# Patient Record
Sex: Male | Born: 1935 | Race: White | Hispanic: No | Marital: Married | Smoking: Former smoker
Health system: Southern US, Community
[De-identification: ages and names within clinical notes are randomized; demographics above are authoritative.]

## PROBLEM LIST (undated history)

## (undated) DIAGNOSIS — C679 Malignant neoplasm of bladder, unspecified: Secondary | ICD-10-CM

## (undated) DIAGNOSIS — M109 Gout, unspecified: Secondary | ICD-10-CM

## (undated) DIAGNOSIS — J342 Deviated nasal septum: Secondary | ICD-10-CM

## (undated) DIAGNOSIS — I6529 Occlusion and stenosis of unspecified carotid artery: Secondary | ICD-10-CM

## (undated) DIAGNOSIS — Z9889 Other specified postprocedural states: Secondary | ICD-10-CM

## (undated) DIAGNOSIS — K8020 Calculus of gallbladder without cholecystitis without obstruction: Secondary | ICD-10-CM

## (undated) DIAGNOSIS — C801 Malignant (primary) neoplasm, unspecified: Secondary | ICD-10-CM

## (undated) DIAGNOSIS — I10 Essential (primary) hypertension: Secondary | ICD-10-CM

## (undated) DIAGNOSIS — G629 Polyneuropathy, unspecified: Secondary | ICD-10-CM

## (undated) DIAGNOSIS — I359 Nonrheumatic aortic valve disorder, unspecified: Secondary | ICD-10-CM

## (undated) DIAGNOSIS — I4892 Unspecified atrial flutter: Secondary | ICD-10-CM

## (undated) DIAGNOSIS — I714 Abdominal aortic aneurysm, without rupture, unspecified: Secondary | ICD-10-CM

## (undated) DIAGNOSIS — E7800 Pure hypercholesterolemia, unspecified: Secondary | ICD-10-CM

## (undated) DIAGNOSIS — E538 Deficiency of other specified B group vitamins: Secondary | ICD-10-CM

## (undated) DIAGNOSIS — C189 Malignant neoplasm of colon, unspecified: Secondary | ICD-10-CM

## (undated) DIAGNOSIS — I739 Peripheral vascular disease, unspecified: Secondary | ICD-10-CM

## (undated) DIAGNOSIS — N189 Chronic kidney disease, unspecified: Secondary | ICD-10-CM

## (undated) DIAGNOSIS — I4891 Unspecified atrial fibrillation: Secondary | ICD-10-CM

## (undated) DIAGNOSIS — G510 Bell's palsy: Secondary | ICD-10-CM

## (undated) DIAGNOSIS — K9189 Other postprocedural complications and disorders of digestive system: Secondary | ICD-10-CM

## (undated) DIAGNOSIS — I2510 Atherosclerotic heart disease of native coronary artery without angina pectoris: Secondary | ICD-10-CM

## (undated) HISTORY — DX: Essential (primary) hypertension: I10

## (undated) HISTORY — DX: Chronic kidney disease, unspecified: N18.9

## (undated) HISTORY — DX: Peripheral vascular disease, unspecified: I73.9

## (undated) HISTORY — PX: LAMINECTOMY: SHX219

## (undated) HISTORY — DX: Malignant neoplasm of colon, unspecified: C18.9

## (undated) HISTORY — DX: Unspecified atrial flutter: I48.92

## (undated) HISTORY — DX: Malignant neoplasm of bladder, unspecified: C67.9

## (undated) HISTORY — PX: OTHER SURGICAL HISTORY: SHX169

## (undated) HISTORY — DX: Pure hypercholesterolemia, unspecified: E78.00

## (undated) HISTORY — DX: Gout, unspecified: M10.9

## (undated) HISTORY — PX: APPENDECTOMY: SHX54

## (undated) HISTORY — DX: Nonrheumatic aortic valve disorder, unspecified: I35.9

## (undated) HISTORY — DX: Unspecified atrial fibrillation: I48.91

## (undated) HISTORY — DX: Bell's palsy: G51.0

## (undated) HISTORY — DX: Malignant (primary) neoplasm, unspecified: C80.1

## (undated) HISTORY — DX: Calculus of gallbladder without cholecystitis without obstruction: K80.20

## (undated) HISTORY — DX: Deficiency of other specified B group vitamins: E53.8

## (undated) HISTORY — PX: TOE SURGERY: SHX1073

## (undated) HISTORY — PX: HEMICOLECTOMY: SHX854

## (undated) HISTORY — DX: Abdominal aortic aneurysm, without rupture, unspecified: I71.40

## (undated) HISTORY — DX: Atherosclerotic heart disease of native coronary artery without angina pectoris: I25.10

## (undated) HISTORY — DX: Occlusion and stenosis of unspecified carotid artery: I65.29

## (undated) HISTORY — DX: Abdominal aortic aneurysm, without rupture: I71.4

---
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
## 1997-11-26 ENCOUNTER — Ambulatory Visit (HOSPITAL_COMMUNITY): Admission: RE | Admit: 1997-11-26 | Discharge: 1997-11-26 | Payer: Self-pay | Admitting: Gastroenterology

## 1999-04-21 DIAGNOSIS — G629 Polyneuropathy, unspecified: Secondary | ICD-10-CM

## 1999-04-21 HISTORY — DX: Polyneuropathy, unspecified: G62.9

## 2000-02-19 HISTORY — PX: CORONARY ARTERY BYPASS GRAFT: SHX141

## 2000-04-23 ENCOUNTER — Inpatient Hospital Stay (HOSPITAL_COMMUNITY): Admission: EM | Admit: 2000-04-23 | Discharge: 2000-05-04 | Payer: Self-pay

## 2000-04-26 ENCOUNTER — Encounter: Payer: Self-pay | Admitting: Internal Medicine

## 2000-04-27 ENCOUNTER — Encounter: Payer: Self-pay | Admitting: Cardiothoracic Surgery

## 2000-04-28 ENCOUNTER — Encounter: Payer: Self-pay | Admitting: Cardiothoracic Surgery

## 2000-04-29 ENCOUNTER — Encounter: Payer: Self-pay | Admitting: Cardiothoracic Surgery

## 2000-04-30 ENCOUNTER — Encounter: Payer: Self-pay | Admitting: Cardiothoracic Surgery

## 2000-05-25 ENCOUNTER — Encounter (HOSPITAL_COMMUNITY): Admission: RE | Admit: 2000-05-25 | Discharge: 2000-08-23 | Payer: Self-pay | Admitting: *Deleted

## 2000-08-30 ENCOUNTER — Encounter (HOSPITAL_COMMUNITY): Admission: RE | Admit: 2000-08-30 | Discharge: 2000-11-28 | Payer: Self-pay | Admitting: *Deleted

## 2000-09-17 ENCOUNTER — Ambulatory Visit (HOSPITAL_COMMUNITY): Admission: RE | Admit: 2000-09-17 | Discharge: 2000-09-17 | Payer: Self-pay | Admitting: *Deleted

## 2000-11-29 ENCOUNTER — Encounter (HOSPITAL_COMMUNITY): Admission: RE | Admit: 2000-11-29 | Discharge: 2000-11-30 | Payer: Self-pay | Admitting: *Deleted

## 2000-12-21 ENCOUNTER — Ambulatory Visit (HOSPITAL_COMMUNITY): Admission: RE | Admit: 2000-12-21 | Discharge: 2000-12-21 | Payer: Self-pay | Admitting: *Deleted

## 2001-01-17 ENCOUNTER — Ambulatory Visit (HOSPITAL_COMMUNITY): Admission: RE | Admit: 2001-01-17 | Discharge: 2001-01-17 | Payer: Self-pay | Admitting: *Deleted

## 2001-01-17 ENCOUNTER — Encounter: Payer: Self-pay | Admitting: *Deleted

## 2001-01-26 ENCOUNTER — Observation Stay (HOSPITAL_COMMUNITY): Admission: RE | Admit: 2001-01-26 | Discharge: 2001-01-27 | Payer: Self-pay | Admitting: Urology

## 2001-01-26 ENCOUNTER — Encounter (INDEPENDENT_AMBULATORY_CARE_PROVIDER_SITE_OTHER): Payer: Self-pay | Admitting: *Deleted

## 2001-01-29 ENCOUNTER — Inpatient Hospital Stay (HOSPITAL_COMMUNITY): Admission: EM | Admit: 2001-01-29 | Discharge: 2001-02-01 | Payer: Self-pay | Admitting: Emergency Medicine

## 2001-01-31 ENCOUNTER — Encounter: Payer: Self-pay | Admitting: Urology

## 2001-02-18 HISTORY — PX: BLADDER SURGERY: SHX569

## 2001-02-23 ENCOUNTER — Encounter (INDEPENDENT_AMBULATORY_CARE_PROVIDER_SITE_OTHER): Payer: Self-pay | Admitting: Specialist

## 2001-02-23 ENCOUNTER — Inpatient Hospital Stay (HOSPITAL_COMMUNITY): Admission: RE | Admit: 2001-02-23 | Discharge: 2001-03-03 | Payer: Self-pay | Admitting: Urology

## 2001-03-16 ENCOUNTER — Encounter: Payer: Self-pay | Admitting: Urology

## 2001-03-16 ENCOUNTER — Encounter: Admission: RE | Admit: 2001-03-16 | Discharge: 2001-03-16 | Payer: Self-pay | Admitting: Urology

## 2001-03-23 ENCOUNTER — Encounter: Payer: Self-pay | Admitting: Urology

## 2001-03-23 ENCOUNTER — Inpatient Hospital Stay (HOSPITAL_COMMUNITY): Admission: EM | Admit: 2001-03-23 | Discharge: 2001-03-26 | Payer: Self-pay | Admitting: Emergency Medicine

## 2001-03-23 ENCOUNTER — Encounter: Payer: Self-pay | Admitting: Emergency Medicine

## 2001-03-25 ENCOUNTER — Encounter: Payer: Self-pay | Admitting: Urology

## 2001-04-08 ENCOUNTER — Encounter: Admission: RE | Admit: 2001-04-08 | Discharge: 2001-04-08 | Payer: Self-pay | Admitting: Urology

## 2001-04-08 ENCOUNTER — Encounter: Payer: Self-pay | Admitting: Urology

## 2001-07-01 ENCOUNTER — Inpatient Hospital Stay (HOSPITAL_COMMUNITY): Admission: AD | Admit: 2001-07-01 | Discharge: 2001-07-03 | Payer: Self-pay | Admitting: Internal Medicine

## 2001-12-27 ENCOUNTER — Encounter: Payer: Self-pay | Admitting: Urology

## 2001-12-27 ENCOUNTER — Encounter: Admission: RE | Admit: 2001-12-27 | Discharge: 2001-12-27 | Payer: Self-pay | Admitting: Urology

## 2002-04-20 HISTORY — PX: ANGIOPLASTY: SHX39

## 2002-05-08 ENCOUNTER — Inpatient Hospital Stay (HOSPITAL_COMMUNITY): Admission: RE | Admit: 2002-05-08 | Discharge: 2002-05-09 | Payer: Self-pay | Admitting: *Deleted

## 2002-07-03 ENCOUNTER — Ambulatory Visit (HOSPITAL_COMMUNITY): Admission: RE | Admit: 2002-07-03 | Discharge: 2002-07-03 | Payer: Self-pay | Admitting: Gastroenterology

## 2002-07-03 ENCOUNTER — Encounter (INDEPENDENT_AMBULATORY_CARE_PROVIDER_SITE_OTHER): Payer: Self-pay | Admitting: *Deleted

## 2002-07-20 ENCOUNTER — Encounter: Payer: Self-pay | Admitting: Gastroenterology

## 2002-07-20 ENCOUNTER — Encounter: Admission: RE | Admit: 2002-07-20 | Discharge: 2002-07-20 | Payer: Self-pay | Admitting: Gastroenterology

## 2002-07-20 HISTORY — PX: COLECTOMY: SHX59

## 2002-08-08 ENCOUNTER — Inpatient Hospital Stay (HOSPITAL_COMMUNITY): Admission: RE | Admit: 2002-08-08 | Discharge: 2002-08-13 | Payer: Self-pay | Admitting: General Surgery

## 2002-08-08 ENCOUNTER — Encounter (INDEPENDENT_AMBULATORY_CARE_PROVIDER_SITE_OTHER): Payer: Self-pay

## 2003-06-26 ENCOUNTER — Inpatient Hospital Stay (HOSPITAL_COMMUNITY): Admission: AD | Admit: 2003-06-26 | Discharge: 2003-06-28 | Payer: Self-pay | Admitting: *Deleted

## 2004-05-05 ENCOUNTER — Ambulatory Visit (HOSPITAL_COMMUNITY): Admission: RE | Admit: 2004-05-05 | Discharge: 2004-05-05 | Payer: Self-pay | Admitting: General Surgery

## 2004-09-18 ENCOUNTER — Ambulatory Visit (HOSPITAL_COMMUNITY): Admission: RE | Admit: 2004-09-18 | Discharge: 2004-09-18 | Payer: Self-pay | Admitting: *Deleted

## 2004-10-15 ENCOUNTER — Ambulatory Visit (HOSPITAL_COMMUNITY): Admission: RE | Admit: 2004-10-15 | Discharge: 2004-10-15 | Payer: Self-pay | Admitting: *Deleted

## 2005-09-18 HISTORY — PX: MOHS SURGERY: SUR867

## 2005-10-20 ENCOUNTER — Observation Stay (HOSPITAL_COMMUNITY): Admission: RE | Admit: 2005-10-20 | Discharge: 2005-10-21 | Payer: Self-pay | Admitting: *Deleted

## 2005-12-17 ENCOUNTER — Ambulatory Visit: Payer: Self-pay | Admitting: Internal Medicine

## 2005-12-22 ENCOUNTER — Ambulatory Visit (HOSPITAL_COMMUNITY): Admission: RE | Admit: 2005-12-22 | Discharge: 2005-12-23 | Payer: Self-pay | Admitting: Internal Medicine

## 2005-12-22 ENCOUNTER — Ambulatory Visit: Payer: Self-pay | Admitting: Internal Medicine

## 2006-01-20 ENCOUNTER — Ambulatory Visit: Payer: Self-pay | Admitting: Internal Medicine

## 2006-01-25 ENCOUNTER — Ambulatory Visit: Payer: Self-pay | Admitting: Internal Medicine

## 2006-01-25 ENCOUNTER — Ambulatory Visit (HOSPITAL_COMMUNITY): Admission: RE | Admit: 2006-01-25 | Discharge: 2006-01-26 | Payer: Self-pay | Admitting: Internal Medicine

## 2006-03-04 ENCOUNTER — Ambulatory Visit: Payer: Self-pay | Admitting: Cardiology

## 2006-03-18 ENCOUNTER — Ambulatory Visit: Payer: Self-pay | Admitting: Internal Medicine

## 2006-04-01 ENCOUNTER — Ambulatory Visit: Payer: Self-pay | Admitting: Cardiology

## 2006-08-31 ENCOUNTER — Inpatient Hospital Stay (HOSPITAL_COMMUNITY): Admission: AD | Admit: 2006-08-31 | Discharge: 2006-09-04 | Payer: Self-pay | Admitting: Internal Medicine

## 2006-09-01 ENCOUNTER — Encounter (INDEPENDENT_AMBULATORY_CARE_PROVIDER_SITE_OTHER): Payer: Self-pay | Admitting: Specialist

## 2006-09-10 ENCOUNTER — Emergency Department (HOSPITAL_COMMUNITY): Admission: EM | Admit: 2006-09-10 | Discharge: 2006-09-10 | Payer: Self-pay | Admitting: *Deleted

## 2006-09-11 ENCOUNTER — Inpatient Hospital Stay (HOSPITAL_COMMUNITY): Admission: EM | Admit: 2006-09-11 | Discharge: 2006-09-13 | Payer: Self-pay | Admitting: Emergency Medicine

## 2006-09-30 ENCOUNTER — Ambulatory Visit (HOSPITAL_COMMUNITY): Admission: RE | Admit: 2006-09-30 | Discharge: 2006-09-30 | Payer: Self-pay | Admitting: Internal Medicine

## 2006-10-01 ENCOUNTER — Encounter: Admission: RE | Admit: 2006-10-01 | Discharge: 2006-10-01 | Payer: Self-pay | Admitting: General Surgery

## 2006-11-16 ENCOUNTER — Ambulatory Visit (HOSPITAL_COMMUNITY): Admission: RE | Admit: 2006-11-16 | Discharge: 2006-11-16 | Payer: Self-pay | Admitting: Urology

## 2007-01-24 ENCOUNTER — Ambulatory Visit: Admission: RE | Admit: 2007-01-24 | Discharge: 2007-01-24 | Payer: Self-pay | Admitting: Surgery

## 2007-02-05 ENCOUNTER — Emergency Department (HOSPITAL_COMMUNITY): Admission: EM | Admit: 2007-02-05 | Discharge: 2007-02-05 | Payer: Self-pay | Admitting: Emergency Medicine

## 2007-02-10 ENCOUNTER — Encounter (INDEPENDENT_AMBULATORY_CARE_PROVIDER_SITE_OTHER): Payer: Self-pay | Admitting: Surgery

## 2007-02-10 ENCOUNTER — Inpatient Hospital Stay (HOSPITAL_COMMUNITY): Admission: RE | Admit: 2007-02-10 | Discharge: 2007-02-18 | Payer: Self-pay | Admitting: Surgery

## 2009-11-07 ENCOUNTER — Encounter (INDEPENDENT_AMBULATORY_CARE_PROVIDER_SITE_OTHER): Payer: Self-pay | Admitting: Interventional Cardiology

## 2009-11-07 ENCOUNTER — Ambulatory Visit (HOSPITAL_COMMUNITY): Admission: RE | Admit: 2009-11-07 | Discharge: 2009-11-07 | Payer: Self-pay | Admitting: Interventional Cardiology

## 2010-03-27 ENCOUNTER — Emergency Department (HOSPITAL_COMMUNITY)
Admission: EM | Admit: 2010-03-27 | Discharge: 2010-03-27 | Payer: Self-pay | Source: Home / Self Care | Admitting: Emergency Medicine

## 2010-05-11 ENCOUNTER — Encounter: Payer: Self-pay | Admitting: Internal Medicine

## 2010-07-30 ENCOUNTER — Other Ambulatory Visit: Payer: Self-pay | Admitting: Dermatology

## 2010-08-19 HISTORY — PX: AORTIC VALVE REPLACEMENT: SHX41

## 2010-09-02 NOTE — Consult Note (Signed)
NAMEHARLOW, [name]:  [id number]   MEDICAL RECORD NO.:  [id number]          PATIENT TYPE:  INP   LOCATION:  1423                         FACILITY:  [facility]   PHYSICIAN:  Peter M. Swaziland, M.D.  DATE OF BIRTH:  02-07-1931   DATE OF CONSULTATION:  02/11/2007  DATE OF DISCHARGE:                                 CONSULTATION   REASON FOR CONSULTATION:  Atrial fibrillation with rapid ventricular  response.   HISTORY OF PRESENT ILLNESS:  The patient is a pleasant 77 year old white  male with a history of coronary artery disease, status post coronary  artery bypass graft surgery and prior stent.  He has a history of  refractory atrial fibrillation despite three prior ablation procedures  and multiple cardioversions.  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  He had laparotomy with lysis of adhesions, small bowel resection, and  take down of enteric fistula as well as cholecystectomy.  Since the  surgery, the patient has been NPO and has had NG drain in place.  The  patient has chronically been on metoprolol and Cardizem for rate  control.  Today it was noted that his heart rate had increased and he  sustained a heart rate of 140.  The patient denies any chest pain,  shortness of breath, lightheadedness, and palpitations and generally  feels well.   PAST MEDICAL HISTORY:  1. Coronary artery disease status post CABG x5, status post stenting      of the anastomotic site of the vein graft to the diagonal in 2004      with a drug eluting stent.  2. History of atrial fibrillation, status post multiple      cardioversions, status post three ablation procedures without      success.  He has been on chronic Coumadin therapy.  3. History of bladder and prostate cancer, status post      cystoprostatectomy.  4. History of esophageal ulcer.  5. Hypertension.  6. Gallstones.  7. Colon cancer status  post hemicolectomy.  8. 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  9. Status post lumbar laminectomy.  10.History of dyslipidemia.  11.Gastroesophageal reflux disease.   MEDICATIONS:  1. Metoprolol 50 mg per day.  2. Cardizem CD 240 mg per day.  3. Multivitamin daily.  4. Aspirin 325 mg per day.  5. Famotidine 10 mg per day.  6. Lipitor 40 mg per day.  7. Lasix 40 mg per day.  8. Bicitra two tablespoons b.i.d.  9. Vitamin B12 monthly.  10.Coumadin.   ALLERGIES:  ACE INHIBITORS which cause angioedema.   SOCIAL HISTORY:  The patient quit smoking in 1988 and rarely uses  alcohol.   FAMILY HISTORY:  Mother died of cancer.  Father died at age 64 and had  coronary artery disease and diabetes.  He has two brothers who are alive  and well.   REVIEW OF SYSTEMS:  He states that he is doing well without any  significant tachycardia prior to his hospitalization.  He has  had no  increased edema, orthopnea, or PND.  He has had no known history of  stroke or other embolic phenomena.  Other review of systems are  negative.   PHYSICAL EXAMINATION:  GENERAL:  The patient is a well-developed, white  male in no distress.  He has an NG in place to wall suction.  VITAL SIGNS:  Blood pressure 130/70, pulse 130-140 sustained atrial  fibrillation.  He is afebrile and saturations are 97% on room air.  HEENT:  Unremarkable.  No JVD, adenopathy, or bruits.  LUNGS:  Clear.  HEART:  Irregular rate and rhythm with a grade 2/6 systolic murmur in  the apex. There is no S3.  ABDOMEN:  Soft.  Surgical dressings and drains in place.  EXTREMITIES:  No lower extremity edema.  Pulses are 2+ and symmetric.  NEUROLOGY:  Intact.   LABORATORY DATA:  Magnesium 2.  Cardiac enzymes are normal. Sodium 140,  potassium 3.8, chloride 107, CO2 27, BUN 23, creatinine 1.55, glucose  159, white count 15,200, hemoglobin 13.4, hematocrit 39.4, and platelets  258,000.   Chest x-ray shows no active disease  preoperatively.   EKG shows atrial fibrillation with rapid ventricular response and right  bundle branch block.   IMPRESSION:  1. Chronic atrial fibrillation now with increased ventricular response      due to inability to take his p.o. medications.  2. Status post abdominal surgery as noted.  3. Coronary artery disease, status post coronary artery bypass graft      and status post prior stent.  4. Hypertension.  5. Dyslipidemia.   PLAN:  We will need to place the patient on IV Cardizem continuous  infusion for rate control until he is able to resume his p.o. intake.  We may supplement with IV Lopressor as needed for rate control.  Once he  is able to take p.o. again, we will resume his normal p.o. medicines and  will resume anticoagulation when okay with surgery.           ______________________________  Peter M. Swaziland, M.D.     PMJ/MEDQ  D:  02/11/2007  T:  02/13/2007  Job:  [id number]   cc:   Theressa Millard, M.D.  Fax: [phone]   Corky Crafts, MD  Fax: [phone]   Velora Heckler, MD  [phone] [address]  [place]  [place] [address]   Valetta Fuller, M.D.  Fax: [phone]

## 2010-09-02 NOTE — Op Note (Signed)
NAMEFREDERICK, MARRO NO.:  [id number]   MEDICAL RECORD NO.:  [id number]          PATIENT TYPE:  INP   LOCATION:  4706                         FACILITY:  [facility]   PHYSICIAN:  Cherylynn Ridges, M.D.    DATE OF BIRTH:  [birth year]   DATE OF PROCEDURE:  09/01/2006  DATE OF DISCHARGE:                               OPERATIVE REPORT   PREOPERATIVE DIAGNOSIS:  Acute appendicitis.   POSTOPERATIVE DIAGNOSIS:  Acute appendicitis.   PROCEDURE PERFORMED:  Open appendectomy.   SURGEON:  Marta Lamas. Lindie Spruce, M.D.   ASSISTANT SURGEON:  Kelle Darting. Rennis Harding, N.P.   ANESTHESIA:  General endotracheal.   ESTIMATED BLOOD LOSS:  100 mL.   COMPLICATIONS:  None.   CONDITION ON DISCHARGE:  Stable.   INDICATIONS FOR PROCEDURE:  The patient is a 77 year old who came in  yesterday evening at about 11:00 with acute appendicitis by CT scan who  is also anticoagulated, who now comes in for an open appendectomy.   FINDINGS:  The patient had an adhesed retrocecal appendix on the right  side.  There was no evidence of perforation or pus or purulent pocket.  It came out in two pieces, one along the base, and then also the distal  tip.   DESCRIPTION OF PROCEDURE:  The patient was taken to the operating room  and placed on the table in the supine position.  After an adequate  general endotracheal anesthetic was administered, he was prepped and  draped in the usual sterile manner, exposing the right lower quadrant.   A transverse curvilinear incision about 7-8 cm long was made at the  level of McBurney's point, taken out to the external oblique fascia.  We  then opened the fascia along its fibers, exposing the transversus  abdominis and the internal oblique muscle.  We split those muscles along  their fibers and then bluntly dissected down to the peritoneum.  Goulet  retractors were placed in there.  We opened the peritoneum using  Metzenbaum scissors and opened it medially and laterally.   Once  we were in the peritoneal cavity, there were dense adhesions of  small bowel to the right lower quadrant and those had to be broken down  bluntly with finger dissection.  During so, there was some bleeding from  the raw peritoneal surface.  We were able to find the terminal ileum,  then mobilized that down to the cecum.  At that point we could palpate  the appendix which coursed posteriorly and laterally and superiorly.  We  were able to free it up from its surrounding structures.  However, we  could never mobilize it completely up into the wound, although we could  get the cecum up into the wound using Babcock forceps.  We mobilized the  cecum at its base and then came across it with a TA-30 stapler with 3.5  mm closure staples.  We cut across this and then took off a piece of the  appendix there, and then a second piece which was sort of in the  midportion of the appendix which measured about 3  cm in size.  The base  piece of the appendix was about the same size.   We could palpate that this had broken off from a piece of appendix that  was left in the wound attached to the meso appendix with some bleeding.  We were able to mobilize this bluntly and then bring it out separately  as the tip of the appendix.  What we thought was the meso appendix at  the base of the cecum coursing posteriorly and medially was meso  appendix which was clamped with a Kelly clamp and then tied with 3-0  Vicryl ties.  This seemed to control the bleeding along with packing of  the wound.   We irrigated with saline solution, found there to be minimal bleeding as  we were closing.  We aspirated with a pool tip suction and a bit of  irrigation and found there to be minimal acute bleeding.   We closed the peritoneum using a running 3-0 PDS suture.  The internal  oblique and transversus abdominis muscles were reapproximated using  interrupted figure-of-eight stitches of 0 PDS.  The external oblique  fascia was  closed using a running 0 PDS suture; and then Scarpa's fascia  was reapproximated using interrupted 3-0 Vicryl.  10 mL of 0.25%  Marcaine with epinephrine was injected into the subcu.  Then the skin  was closed using a running subcuticular stitch of 4-0 Monocryl.  A  sterile dressing was applied.  All needle counts, sponge counts and  instrument counts were correct.      Cherylynn Ridges, M.D.  Electronically Signed     JOW/MEDQ  D:  09/01/2006  T:  09/01/2006  Job:  [id number]

## 2010-09-02 NOTE — Op Note (Signed)
[name], [name]               ACCOUNT NO.:  [id number]   MEDICAL RECORD NO.:  [id number]          PATIENT TYPE:  INP   LOCATION:  1525                         FACILITY:  [facility]   PHYSICIAN:  Velora Heckler, MD      DATE OF BIRTH:  1930/09/25   DATE OF PROCEDURE:  02/10/2007  DATE OF DISCHARGE:                               OPERATIVE REPORT   PREOPERATIVE DIAGNOSIS:  Fistula to neobladder, cholelithiasis.   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   PROCEDURE:  1. Exploratory laparotomy with lysis of adhesions.  2. Small-bowel resection with takedown of enteric fistula.  3. Repair enterotomy of neobladder x2.  4. Cholecystectomy with intraoperative cholangiography.   CO-SURGEONS:  Velora Heckler, M.D., FACS (General Surgery), and Valetta Fuller, M.D. (Urologic Surgery)   ANESTHESIA:  General.   ESTIMATED BLOOD LOSS:  150 mL.   PREPARATION:  Betadine.   COMPLICATIONS:  None.   INDICATIONS:  The patient is a 77 year old white male with a complex  past surgical history.  He has had a previous sigmoid colectomy for  carcinoma by Dr. Jerelene Redden.  He underwent a total cystectomy for  bladder cancer in 2002 with creation of a neobladder.  More recently,  the patient underwent operation for perforated appendicitis.  This was  complicated by abscess formation requiring repeat exploration and  abscess drainage.  During his recovery, the patient has developed signs  and symptoms of a fistulous connection between the bowel and the  neobladder.  The site of fistula was suspected to be the cecum.  The  patient is now prepared and brought to the operating room for takedown  of fistula and repair of neobladder.  The patient also had a  preoperative CT scan documenting multiple gallstones with a thick-walled  gallbladder, so the patient anticipates cholecystectomy during this  procedure.   DESCRIPTION OF PROCEDURE:  The  procedure is done in OR #1 at the [facility].  The patient is brought to the operating room  and placed in the supine position on the operating room table.  Following administration of general anesthesia, the patient is prepped  and draped in usual strict aseptic fashion.   After ascertaining that an adequate level of anesthesia had been  obtained, the neobladder was intubated through the penis by Dr. Barron Alvine with a sterile Foley catheter.  Next, the previous midline  abdominal incision was reopened with a #10 blade.  It was extended  cephalad into the epigastrium.  Dissection was carried through  subcutaneous tissues and hemostasis obtained with electrocautery.  Fascia is incised in the midline.  The peritoneal cavity was entered  cautiously.  Adhesions were taken down with sharp dissection on of both  sides of the abdominal wall, allowing for full mobilization of the  abdominal wall and complete midline incision.  Balfour retractors placed  for exposure.  Extensive lysis of adhesions was performed on the small  bowel, sigmoid colon, and the ascending colon.  The neobladder was  identified.  However, on mobilizing the neobladder  from its adhesions to  the anterior abdominal wall, a small enterotomy was made in the dome of  the neobladder.  This was marked with a 2-0 silk suture.  Further  dissection of the right colon allows for complete mobilization of the  right colon away from the neobladder.  There was no evidence of fistula  between the colon and the neobladder.  The appendiceal orifice was  properly healed and scarred, consistent with previous appendectomy.  The  terminal ileum was then dissected back to the prior anastomosis.  The  remainder of the small bowel adhesions were lysed, allowing for complete  mobilization of the small bowel.  The neobladder was mobilized with  lysis of adhesions.  It appeared that the fistulous connection was  between the  prior enteric anastomosis in the distal ileum and the  afferent limb of the neobladder near the insertion points for the  ureters.  This was carefully dissected out with sharp dissection.  The  fistulous tract was divided.  It measured approximately 3-4 mm in  diameter.  The segment of small bowel at the prior anastomosis where the  fistula was located was then resected.  This was performed by dividing  the bowel proximal and distal to the anastomosis with a GIA stapler.  The mesentery was taken down with the LigaSure.  The major branch of the  right colic artery was ligated doubly with 2-0 silk ties and divided.  Small bowel resection specimen was submitted to pathology for review.  Next, a side-to-side functionally end-to-end anastomosis was created in  the mid ileum using the GIA stapler.  The staple line was inspected for  hemostasis.  The enterotomy was closed with a TA-60 stapler.  The  mesenteric defect was closed with interrupted 2-0 silk sutures.   Next, we turned our attention back to the neobladder.  The fistulous  tract at the most proximal extent of the afferent limb was excised  sharply.  The enterotomy was then closed with interrupted 3-0 Vicryl  sutures in two layers.  Next, the previous enterotomy at the dome of the  neobladder was excised sharply.  This enterotomy was then closed with  two layers of interrupted 3-0 Vicryl sutures.  The neobladder was then  distended with saline instilled through the Foley catheter.  There was  no evidence of leakage.   The gallbladder was then addressed.  There were numerous adhesions on  the right upper quadrant which were carefully taken down.  The  transverse colon was mobilized away from the undersurface of the liver.  The dome of the gallbladder was identified.  The gallbladder was quite  thick-walled with chronic inflammatory changes.  These were taken down  with sharp dissection and use of the electrocautery for hemostasis.   The  gallbladder was resected out of the gallbladder bed using the  electrocautery for hemostasis.  Dissection was carried distally.  Adhesions to the duodenum were taken down sharply.  Dissection was  carried down to the neck of the gallbladder.  The cystic duct was  identified.  An incision was made into the anterior surface of the  cystic duct, and a Cook cholangiography catheter was introduced and  secured with a large Ligaclip.  Using C-arm fluoroscopy, real time  cholangiography was performed.  There was free flow of contrast into a  normal caliber biliary tree.  There was flow of contrast distally into  the duodenum without filling defect or obstruction.  There was reflux of  contrast into the right and left hepatic ductal systems.  The clip was  withdrawn, and the [facility] catheter was removed.  The cystic duct was  divided between right-angle clamps and ligated with 2-0 silk ties and  large Ligaclips.  Remaining attachments to the gallbladder were then  divided between large Ligaclips, and the gallbladder was completely  extracted.  It contained pigmented gallstones.  The right upper quadrant  was irrigated with warm saline which was evacuated.  Good hemostasis was  noted.  The pelvis was irrigated with warm saline which was evacuated.  A 19-French Blake drain was brought in from a right lower quadrant stab  wound and placed across the anterior surface of the neobladder.  It was  secured to the skin with a 3-0 nylon suture and placed to bulb suction.  The midline abdominal incision was closed with interrupted #1 Novofil  simple sutures.  The subcutaneous tissues were irrigated.  The skin was  closed with stainless steel staples.  The drain was placed to bulb  suction.  Sterile dressings were applied.   The patient was awakened from anesthesia and brought to the recovery  room in stable condition.  The patient tolerated the procedure well.      Velora Heckler, MD  Electronically  Signed     TMG/MEDQ  D:  02/10/2007  T:  02/11/2007  Job:  [id number]   cc:   Valetta Fuller, M.D.  Fax: [phone]   Cherylynn Ridges, M.D.  [address], [address]  [place]  [place] [address]   Corky Crafts, MD  Fax: [phone]   Theressa Millard, M.D.  Fax: [phone]

## 2010-09-02 NOTE — Consult Note (Signed)
NAMETAINO, MAERTENS NO.:  [id number]   MEDICAL RECORD NO.:  [id number]          PATIENT TYPE:  INP   LOCATION:  4706                         FACILITY:  [facility]   PHYSICIAN:  Ardeth Sportsman, MD     DATE OF BIRTH:  Aug 27, 1930   DATE OF CONSULTATION:  08/31/2006  DATE OF DISCHARGE:                                 CONSULTATION   PRIMARY CARE PHYSICIAN:  Theressa Millard, M.D.   REQUESTING PHYSICIAN:  Hal T. Stoneking, M.D.   SURGEON:  Ardeth Sportsman, M.D. for CCS M.D.   REASON FOR CONSULTATION:  Probable appendicitis.   HISTORY OF PRESENT ILLNESS:  [name] is a 77 year old male with  numerous medical issues who awoke earlier today with diffuse lower  abdominal pain.  Described it as crampy, diffuse, and painful.  He had  some decreased appetite, but no major nausea or vomiting.  No sick  contacts or travel history.  The pain has sort of migrated more to the  right flank.  He has never had a feeling like this before.  He basically  went to his primary care physician's office who based on concerns  admitted the patient for observation and a CAT scan was ordered.  The  CAT scan was consistent with appendicitis.  We have been asked to  evaluate the patient.   PAST MEDICAL HISTORY:  1. Coronary artery disease.  2. History of atrial fibrillation/flutter status post at least 2 if      not 3 ectopic ablations.  3. Bladder and prostate cancer status post prostatectomy with      neobladder reconstruction by Dr. Isabel Caprice in 2002, [id number]  4. Esophageal ulcer.  5. Metabolic acidosis secondary to ileo neobladder.  6. Hypertension.  7. Asymptomatic cholecystolithiasis.  8. Left colon cancer, last colonoscopy in 2006 was normal.   PAST SURGICAL HISTORY:  1. Coronary artery bypass grafting x5 on January 2002.  2. Radicle cystoprostatectomy in November 2002.  3. History for tic douloureux.  4. Left hemicolectomy with rectosigmoid anastomosis on March 2004.  5. Lumbar  laminectomy.  6. Prior cardiac ablations.   SOCIAL HISTORY:  He has probably about a 50 pack years of tobacco, but  he has not smoked in the past 20 years.  He has a couple of drinks of  alcohol a week.  No other drug use.   FAMILY HISTORY:  Father had history of coronary artery disease and  diabetes.  He passed away at age 50.  Mother passed away of some cancer  in the abdomen, it is not known.  He has 2 brothers that are alive and  well.   MEDICATIONS:  At home include:  1. Coumadin.  2. Lipitor.  3. _______.  4. Metoprolol.  5. Amlodipine.  6. Pepcid.   REVIEW OF SYSTEMS:  Known per HPI.  GENERAL:  Some decreased appetite,  otherwise no recent fever, chills, or weight gain or weight loss.  EYES:  He does wear glasses, but no major vision changes.  HEENT:  No auditory  changes or difficulty in taste or swallowing.  CARDIAC:  He usually  walks several blocks before he gets tired.  It is usually because his  legs get tired.  No exertional chest pain.  No major PND or orthopnea.  GI:  Known per HPI.  No hematochezia or melena.  GU:  No dysuria,  polyuria, or hematuria.  MUSCULOSKELETAL:  Some generalized  osteoarthritis, but no major joint abnormalities.  NEUROLOGICAL:  Otherwise negative.  No history of seizures or strokes.  PSYCHIATRIC:  No episodes of delirium or psychosis or paranoia.  Otherwise, hepatic,  renal, endocrine are negative.   PHYSICAL EXAMINATION:  VITAL SIGNS:  His current temperature is 97.1,  pulse 92, respirations 20, blood pressure 113/64, 94% sats on room air.  He is about 5/10 for pain.  GENERAL:  He is a well-developed, well-nourished male lying in bed in no  acute distress.  HEENT:  Eyes, pupils equal, round, reactive to light.  Extraocular  movements are intact.  Sclerae are not injected.  He is wearing glasses.  Normocephalic with no facial asymmetry.  Mucous membranes are somewhat  dry.  Nasopharynx and oropharynx are clear.  NECK:  Supple.  No  masses.  Trachea is midline.  HEART:  Irregularly irregular.  No major clicks or rubs.  CHEST:  Clear to auscultation bilaterally.  No wheezes, rales, or  rhonchi.  No pain to rib or sternal compression.  ABDOMEN:  Soft, not particularly extended.  He does have some tenderness  to palpation in his right flank at somewhat superior to McBurney's  point, but he was uncomfortable.  There is no evidence of any  periumbilical hernia or other incisional hernias.  GENITAL:  Normal external male genitalia.  RECTAL:  Deferred per patient request.  MUSCULOSKELETAL:  Full range of motion in shoulders, elbows, wrists, as  well as hip, knees, and ankles.  NEUROLOGICAL:  Cranial nerves II-XII intact.  Hand grips 5 to 5 equal  and symmetrical.  No resting or tension tremors.  LYMPH:  No head, neck, axillary, or groin lymphadenopathy.  SKIN:  No major sores or lesions.  No petechiae.  No purpura.  PSYCH: No evidence of any dementia, psychosis, or paranoia.   LABORATORY DATA:  His white count is 18,000, hemoglobin of 14.  His INR  is 3.  His PTT is 46, potassium is 3.5, BUN is 43, creatinine 1.66.   The CT scan I reviewed with radiology and discussed with Dr. Pete Glatter,  it shows a binding loop within the cecum consistent with appendicitis.  It appears to be a little bit higher than average.  There is no major  small bowel issues.  No major free air or free fluid, but he does have  mesenteric stranding.   ASSESSMENT AND PLAN:  A 77 year old male with numerous prior abdominal  surgeries and medical issues, but actually pretty functional with  abdomen CT scan, evidence of appendicitis with mild story and a little  atypical exam as far as medication goes, but nonetheless seems most  consistent after ruling everything out.   The anatomy and physiology of the digestive tract was explained and the physiology of appendicitis with his natural history, risks were  discussed as well.  Options discussed and  recommendations were made for  diagnostic laparoscopy with appendectomy and possible conversion to open  versus a stroke, heart attack, deep venous thrombosis, pulmonary, and  death were discussed.  Risks such as bleeding, need for transfusion,  wound infection, abscess, injury to organs, postoperative abscess,  prolonged pain, incisional hernia, and  other risks were discussed.  Questions answered and he agreed to proceed.   Because the patient's INR is out, I discussed with Dr. Pete Glatter, we  both agreed the patient needed fresh frozen plasma (FFP) to help control  any blood loss.  Vitamin K as well.  After getting 3 units of FFP we  will recheck his INR level and if it is less than 1.5 and there is no  strong evidence of any active bleeding, then we will proceed.  For now,  we will hold off until we have a slightly more optimal procedure time.      Ardeth Sportsman, MD  Electronically Signed     SCG/MEDQ  D:  08/31/2006  T:  09/01/2006  Job:  [id number]

## 2010-09-02 NOTE — Discharge Summary (Signed)
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   HISTORY OF PRESENT ILLNESS:  [name] is a 77 year old white male  with a complex past medical history.  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  He had a neobladder  reconstruction.  The patient has had a recent episode of acute  appendicitis complicated by abscess.  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  CT scan abdomen and pelvis also documents  chronic cholecystitis and cholelithiasis.  The patient now comes to  surgery for repair of fistula and cholecystectomy.   HOSPITAL COURSE:  The patient was admitted onto the surgical service.  He was taken to the operating room on the day of admission and underwent  exploratory laparotomy with lysis of adhesions.  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  Neobladder was repaired.  The patient also underwent open  cholecystectomy with intraoperative cholangiography.  Postoperatively,  the patient did well.  He had a persistent ileus for 3-4 days.  His  nasogastric tube was removed on the fourth postoperative day and he was  started on a clear liquid diet.  He had resolution of his ileus and made  good progress.  He had return of bowel function.  The patient's course  was complicated by atrial fibrillation.  He was seen in consultation by  Cardiology, and rate was controlled with a Cardizem infusion.  Cardiac  medications were adjusted postoperatively per Cardiology.  The patient  was also  started back on his chronic anticoagulation.  The patient made  gradual but steady progress and was prepared for discharge home on  postoperative day #8.   DISCHARGE/PLAN:  The patient is discharged home on February 18, 2007.  The patient is tolerating a regular diet and has had return of GI  function.  The patient will have an indwelling Foley catheter in the  neobladder which will be removed in the office by Dr. Barron Alvine at a  later date.   DISCHARGE MEDICATIONS:  Include his usual home medications with an  increased dose of Cardizem and Vicodin as needed for pain.   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   CONDITION AT DISCHARGE:  Improved.      Velora Heckler, MD  Electronically Signed     TMG/MEDQ  D:  02/18/2007  T:  02/18/2007  Job:  [id number]   cc:   Theressa Millard, M.D.  Fax: [phone]   Valetta Fuller, M.D.  Fax: [phone]   [facility]

## 2010-09-02 NOTE — Discharge Summary (Signed)
[name], [name]:  [id number]   MEDICAL RECORD NO.:  [id number]          PATIENT TYPE:  OUT   LOCATION:  XRAY                         FACILITY:  [facility]   PHYSICIAN:  Theressa Millard, M.D.    DATE OF BIRTH:  30-Mar-1931   DATE OF ADMISSION:  09/30/2006  DATE OF DISCHARGE:  09/30/2006                               DISCHARGE SUMMARY   ADMITTING DIAGNOSIS:  Abdominal pain.   DISCHARGE DIAGNOSES:  1. Appendicitis, status post appendectomy.  2. Atrial fibrillation.  3. Coronary artery disease.  4. History of bladder cancer and development of neobladder.  5. History of prostate cancer.  6. History of esophageal ulcer.  7. Hypertension.  8. History of colon cancer.  9. Metabolic acidosis secondary to neobladder.   The patient is a 77 year old man who was admitted with abdominal pain.  He was found to have appendicitis on CT scan.  He was seen in  consultation by general surgery and underwent appendectomy on Sep 02, 2006.  He tolerated that procedure well and was improving.  It was  notable that he had to have his anticoagulation reversed prior to  surgery.  He was ready for discharge when he developed some rapid atrial  fibrillation and Cardizem was added.  We controlled this right and we  were able to discharge him in improved condition.   One of two blood cultures were positive for gram-negative rods so he was  treated with an antibiotic as he was discharged.   DISCHARGE MEDICATIONS:  1. Famotidine 10 mg daily.  2. Coumadin 5 mg daily.  3. Cipro 500 mg b.i.d. x7 days.  4. Cardizem CD 240 mg daily.  5. Vicodin 5/500 1-2 tablets q.6 h. p.r.n. pain.  6. Multivitamin daily.  7. Metoprolol 50 mg daily.  8. Lipitor 40 mg daily.  9. Bicitra 1 tablespoon twice daily.  10.Vitamin B12 IM once monthly.  11.Amlodipine 5 mg daily.   ACTIVITY:  As tolerated.   DIET:  No added salt.   FOLLOW UP:  He will call to make follow-up arrangements with Dr. Lindie Spruce  in  approximately 2 weeks.  He will see me as scheduled.      Theressa Millard, M.D.  Electronically Signed     JO/MEDQ  D:  11/06/2006  T:  11/07/2006  Job:  [id number]   cc:   Cherylynn Ridges, M.D.  Valetta Fuller, M.D.

## 2010-09-02 NOTE — H&P (Signed)
[name]               ACCOUNT NO.:  [id number]   MEDICAL RECORD NO.:  [id number]          PATIENT TYPE:  INP   LOCATION:  4706                         FACILITY:  [facility]   PHYSICIAN:  Tamera C. Lewis, N.P.  DATE OF BIRTH:  [birth year]   DATE OF ADMISSION:  08/31/2006  DATE OF DISCHARGE:                              HISTORY & PHYSICAL   CHIEF COMPLAINT:  Abdominal pain.   HISTORY OF PRESENT ILLNESS:  [name] is a 77 year old white male  patient of Dr. Newell Coral, who awoke this a.m. with abdominal bloating  and right mid abdominal pain.  The pain described as sharp, is a 5 in  severity on a scale of 1-10.  It does not radiate, but he will feel it  in his back on the right side when he lies down.  Also, during this  time, he has had shaking chills.  He denies nausea, vomiting or  diarrhea.  Does not have much of an appetite.  No problems from a GU  standpoint.   The patient has a history of bladder and colon cancer, as well as  coronary artery disease and atrial fib, which requires daily Coumadin.  He is being admitted to an overnight obs bed for IV fluid and stat  abdominal CT scan.   REVIEW OF SYSTEMS:  As above.  Chest:  No cough.  No shortness of  breath.  No chest pain.  Abdomen as above.  Some anorexia.  Extremities:  No edema.  Integument:  No rash.  Peripheral no edema.   PAST MEDICAL HISTORY:  1. CAD with CABG x5.  2. Atrial fib with several cardioversions and 2 attempted laser      ablations, on chronic Coumadin.  3. Bladder and prostate cancer, November 2002, Dr. Jamie Kato.  4. Esophageal ulcer.  5. Hypertension.  6. Possible gallstones.  7. Colon cancer.  Last colonoscopy February 2006.  8. 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   PAST SURGICAL HISTORY:  1. Radical cystoproctostomy, November 2002.  2. CABG x5, April 27, 2000.  3. Surgery for tic [place].  4. Lumbar laminectomy.  5. Toe surgery.  6. Hemicolectomy, March 2004.   SOCIAL HISTORY:  He was smoker until 63 and quit.  ETOH social only.   FAMILY HISTORY:  Mother died of abdominal cancer, question colon cancer.  Father died at 31.  He had a history of CAD and diabetes mellitus.  He  has 2 brothers, which are healthy.   MEDICATIONS:  1. Coumadin 5 mg a day.  2. Lipitor 40 mg daily.  3. Bicitra solution 1 tbsp b.i.d.  4. Cardizem 240 mg daily.  5. Metoprolol 50 mg daily.  6. Amlodipine 5 mg daily.  7. Pepcid 10 mg daily.   DRUG ALLERGIES/INTOLERANCES:  ACE INHIBITORS CAUSE ANGIOEDEMA.   OBJECTIVE:  VITAL SIGNS:  Temp 97.9, pulse 90 and irregular,  respirations 20, blood pressure 122/62, O2 sat is 91% on room air.  GENERAL:  He is pale, alert, in no acute distress.  HEENT:  Eyes:  PERRLA.  EOMs intact.  Ears, nose and throat  unremarkable.  NECK:  No JVD.  No carotid bruits auscultated.  HEART:  Irregular rhythm, rate at 100.  There is a grade 2/6 systolic  ejection murmur.  LUNGS:  Clear to auscultation.  ABDOMINAL:  Bowel sounds present in all 4 quadrants.  There is a well-  healed, lower mid-abdominal scar.  He is tender in the right mid-  abdominal area, but there is no guarding or rebounding.  No evidence of  rash.  EXTREMITY:  Reveals no edema.  NEURO:  Cranial nerves II-XII intact.  Gait steady.  Speech appropriate.   IMPRESSION:  1. Right mid abdominal pain with chills.  2. Atrial fibrillation, on chronic Coumadin therapy.   PLAN:  Labs including blood cultures, CBC, CMET, amylase and lipase, PT,  PTT, stat abdominal CT scan with contrast tonight, and let the on-call  doctor know.      Tamera C. Melvyn Neth, N.P.     TCL/MEDQ  D:  08/31/2006  T:  08/31/2006  Job:  [id number]

## 2010-09-02 NOTE — Op Note (Signed)
[name], [name]:  [id number]   MEDICAL RECORD NO.:  [id number]          PATIENT TYPE:  INP   LOCATION:  1423                         FACILITY:  [facility]   PHYSICIAN:  [name], M.D.  DATE OF BIRTH:  05/04/1930   DATE OF PROCEDURE:  02/10/2007  DATE OF DISCHARGE:                               OPERATIVE REPORT   PREOPERATIVE DIAGNOSES:  1. 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  2. Cholelithiasis.   POSTOPERATIVE DIAGNOSES:  1. 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  2. Cholelithiasis.   OPERATION/PROCEDURE:  1. Exploratory laparotomy with extensive lysis of adhesions.  2. Small bowel resection with takedown of enteric fistula.  3. Closure of neobladder and cystotomies x2.  4. Cholecystectomy with intraoperative cholangiogram.   CO-SURGEON:  [name], M.D.  and [name], M.D.   ANESTHESIA:  General endotracheal.   ESTIMATED BLOOD LOSS:  150 mL.   INDICATIONS:  [name] is a 77 year old male with a complex history.  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  The patient  was also incidentally noted to have adenocarcinoma of the prostate.  The  patient had creation of a neobladder and has done quite well from that  surgery.  He is now approximately 4 years out from that surgery.  Has no  evidence of recurrent or metastatic disease.  He subsequently had a  colectomy which was uneventful.  More recently the patient underwent  exploration for appendicitis.  This was complicated by abscess drainage  and the patient subsequently developed a fecal urea.  Assessment  suggested a GI/GU fistula and the patient continued to have thecal urea.  We elected to wait several months for the acute inflammation to improve.  [name].  He has  remained  afebrile with minimal pain buy has continued to have frank stool per  urethra and has had an indwelling large bore catheter with occasional  occlusion.  The patient was also noted to have a thickened, chronically  inflamed gallbladder.  The patient elected to change his primary general  surgeon and Dr. Gerrit [name] has been kind enough to assume his care.  He now  presents for combined surgery.   DESCRIPTION OF PROCEDURE AND FINDINGS:  The patient was brought to the  operating room.  He is placed in supine position and had successful  induction of general endotracheal anesthesia.  He was prepped and draped  in the usual manner.  The previous catheter had been removed and a new  catheter was placed sterilely on the field.  Dr. Gerrit [name] initiated the  surgery and his note will describe the initial findings.  We also  partook in extensive lysis of adhesions.  This was a combined procedure.  During the course of takedown of adhesions, a small cystotomy was made  in the neobladder which was tagged for later repair.  The right colon  was completely mobilized.  There clearly was a loop of small bowel that  was markedly inflamed and adherent to the afferent limb of the  neobladder near the anastomosis of the two orifices.  A portion of small  bowel that was markedly inflamed, and in this process of fistula  formation, was resected by Dr. Gerrit [name] again his note will outline that  history.  At the completion of the bowel resection, there was a small  fistula identified again in the afferent limb of the neobladder near the  orifices.  The ureteral anastomoses were carefully explored and there  did not appear to be any injury or problem in that area per se.  The  fistulous track was excised..  That enterotomy was then closed with two  layers of O Vicryl suture.  The inadvertant enterotomy/cystotomy  at the  dome of the neobladder was excised to freshen the edges and then closed  with again double  layers of Vicryl suture.  The neobladder was then  tested by significant distention with saline and there was no evidence  of any leakage.  We did place a peritoneal flap of tissue over the  enterotomy that involved the afferent limb of the neobladder to provide  an additional layer of protection from re-formation of any fistula due  to the inflammation.   Attention was then turned towards the gallbladder and that portion of  procedure will again be dictated separately by Dr. Gerrit [name].  Pelvic drain  was placed in closure will be dictated by Dr. Gerrit [name].   We were both present for the entire portion of the procedure and worked  as co-surgeons during this difficult operation.  The patient was brought  to recovery room in stable condition.           ______________________________  [name], M.D.  Electronically Signed     DSG/MEDQ  D:  03/01/2007  T:  03/01/2007  Job:  [id number]   cc:   Parker Heckler, MD  [address]  [place]  [place] [address]   [name], M.D.  Fax: [phone]

## 2010-09-02 NOTE — Consult Note (Signed)
NAMEDEEPAK, BLESS NO.:  [id number]   MEDICAL RECORD NO.:  [id number]          PATIENT TYPE:  EMS   LOCATION:  MAJO                         FACILITY:  [facility]   PHYSICIAN:  Ollen Gross. Vernell Morgans, M.D. DATE OF BIRTH:  1931/03/24   DATE OF CONSULTATION:  09/10/2006  DATE OF DISCHARGE:                                 CONSULTATION   REASON FOR CONSULTATION:  Redness and swelling in right lower quadrant  incision.   HISTORY OF PRESENT ILLNESS:  Mr. Sisemore is a 77 year old male patient  known to me from admission last week for acute appendicitis.  He  underwent an open appendectomy per Dr. Lindie Spruce last week.  The patient's  history significant for atrial fibrillation on Coumadin anticoagulation  and this therapy actually had to be reversed prior to surgery.  The  patient was sent home on oral anticoagulation with a sub-therapeutic INR  and followed up with Dr. Eldridge Dace today for his cardiac followup post  hospitalization, and also INR check.  His INR was 2.1 today.  Dr.  Eldridge Dace noticed that the patient's right lower quadrant area was a  little fluctuant and had some subtle red skin changes.  The patient does  not report significant pain but it is sore, especially with moving.  He  has a negative psoas sign.  The area is not exquisitely tender on exam.  The patient has had no GI symptoms such as nausea, vomiting, or  diarrhea.  He states the skin area has essentially been read since the  surgical procedure.  There is been no drainage.  He was sent home on  Cipro and is finishing that up at this time.   Again, I examined the patient is well as Dr. Carolynne Edouard.  There is a fullness  that is not truly fluctuant but not firm.  No evidence of bruising.  There is diffuse skin redness over the right lower quadrant incision.  Steri-Strips are intact.  There is no drainage.  There is no exquisite  tenderness.  Because of concern for possible abscess formation, white  count was checked.   This was 19,700.  Our initial recommendation to the  patient and his wife were to admit the patient, began to hold the  Coumadin, so INR could become sub-therapeutic, so we could do an  exploration of the incision for possible evacuation of abscess.  We also  wanted to admit the patient to begin empiric broad-spectrum IV  antibiotic therapy.   The patient is reluctant to come into the hospital at this time, since  he is otherwise feeling okay.  After much discussion with Dr. Carolynne Edouard as  well as Dr. Lindie Spruce, who happened to be in the ER and came by to evaluate  the patient as well, it was determined that we could treat the patient  on an outpatient basis for the next 48 hours.  He has been told to stop  his Coumadin and Cipro.  He has been given Augmentin 875 mg b.i.d. to  take over the next several days.  He has been given numbers to contact  us for any emergent problems.   Otherwise, he is to contact Dr. Carolynne Edouard and Dr. Lindie Spruce who will be on call  all weekend to arrange for additional outpatient workup with repeat INR,  CBC, and a CT scan to clarify any fluid collections in the right lower  quadrant of the abdomen.  Again, the patient does not have any signs  consistent with peritonitis or GI issues and plans are to proceed  sometime next week once INR is 1.5 or less with exploration and/or  incision and drainage of the right lower quadrant incision for possible  abscess.   In addition, the patient has been given a prescription for Vicodin  5/325, one to two tablets every 4 hours and for pain #40 dispensed with  zero refills.   As noted he was instructed to notify the surgical team immediately at  [phone].  The on-call physician is Dr. Carolynne Edouard and Dr. Lindie Spruce.  If he has  increasing pain, fever, or otherwise feels he is decompensating.      Allison L. Rennis Harding, N.POllen Gross. Vernell Morgans, M.D.  Electronically Signed    ALE/MEDQ  D:  09/10/2006  T:  09/10/2006  Job:  [id number]   cc:    Cherylynn Ridges, M.D.  Corky Crafts, MD

## 2010-09-02 NOTE — Op Note (Signed)
NAMEKEIR, VIERNES NO.:  [id number]   MEDICAL RECORD NO.:  [id number]          PATIENT TYPE:  INP   LOCATION:  5714                         FACILITY:  [facility]   PHYSICIAN:  Cherylynn Ridges, M.D.    DATE OF BIRTH:  Oct 13, 1930   DATE OF PROCEDURE:  09/12/2006  DATE OF DISCHARGE:                               OPERATIVE REPORT   PREOP DIAGNOSIS:  Abdominal wall abscess status post open appendectomy.   POSTOP DIAGNOSIS:  Abdominal wall abscess status post open appendectomy.   PROCEDURE:  Incision, drainage, and packing of right lower quadrant  abdominal wall abscess.   SURGEON:  Cherylynn Ridges, M.D.   ANESTHESIA:  General endotracheal.   ESTIMATED BLOOD LOSS:  Less than 20 mL.   COMPLICATIONS:  None.   CONDITION:  Stable.   FINDINGS:  The patient had about a 50-75 mL abscess cavity in the right  lower quadrant most of it was lateral with some old blood contained  within.  Aerobic and anaerobic cultures were sent.   INDICATIONS FOR OPERATION:  The patient is a 77 year old who developed  some redness and tenderness around his appendectomy incision from  before.  He did not have a perforated appendicitis; however, the  appendix did come out in several pieces, meaning that there was some  exposure to enteric contents.  In spite of irrigation and drainage at  the initial procedure, he developed an abdominal wall abscess.   OPERATION:  The patient was taken to the operating room, placed on the  table in the supine position.  After an adequate endotracheal anesthetic  was administered, he was prepped and draped in the usual sterile manner  exposing the midline and the right lower quadrant.   We made the incision initially laterally in the lateral portion of the  previous incision; and opened it about three-fourths of the way  medially.  A large amount of mucopurulent debris was extracted.  Pieces  of old blood clot were also extracted.  We took it down to the  Scarpa's  fascial level where no further tracking could be noted.  It was  slightly foul-smelling.  We irrigated with saline solution and also  mechanically debrided it with lap tape gauze.  Once this was done, we  packed with about three-fourths of a roll of Kerlix gauze in saline.  We  covered it with sterile dressings.  All counts were correct.      Cherylynn Ridges, M.D.  Electronically Signed     JOW/MEDQ  D:  09/12/2006  T:  09/12/2006  Job:  [id number]

## 2010-09-05 NOTE — Cardiovascular Report (Signed)
NAME:  [name], [name]:  [id number]   MEDICAL RECORD NO.:  [id number]                   PATIENT TYPE:  OIB   LOCATION:  2852                                 FACILITY:  [facility]   PHYSICIAN:  Francisca December, M.D.               DATE OF BIRTH:  Aug 26, 1930   DATE OF PROCEDURE:  05/08/2002  DATE OF DISCHARGE:                              CARDIAC CATHETERIZATION   PROCEDURE PERFORMED:  Percutaneous coronary intervention/stent implantation,  saphenous vein graft-diagonal anastomosis.   CARDIOLOGIST:  Francisca December, M.D.   INDICATIONS FOR PROCEDURE:  The patient is a 77 year old male who is now two  years status post CABG.  He has noticed recent worsening exertional dyspnea.  An exercise Cardiolite was performed showing reversible anterolateral  defect.  Dr. Candace Cruise completed cardiac catheterization revealing a  subtotal stenosis at the anastomosis of the saphenous vein graft to the  diagonal branch.  He is to undergo catheter-based revascularization at this  time.   PROCEDURAL NOTE:  The patient was returned to the cardiac catheterization  laboratory approximately two hours Dr. Fraser Din completed the diagnostic  procedure.  The previously placed 6 French catheter sheath was exchanged  over a long guiding J wire for a fresh 6 French catheter sheath.  This was  done in a sterile fashion.  The patient received 4600 units of heparin.  A 6  French AL-2 left guiding catheter was advanced to the ascending aorta where  the os of the saphenous vein graft was cannulated.  A 0.014 inch Scimed Luge  intracoronary guide wire was passed across the lesion in the anastomotic  portion without difficulty.  Initial balloon dilatation was performed with a  2.75/15 mm Scimed Maverick intracoronary balloon.  This was inflated to four  atmospheres for one minute.  This balloon was removed and a 2.5/13 mm Cordis  CYPHER intracoronary drug-loading stent was advanced into  position at the  anastomosis.  It was deployed there with a peak pressure of 14 atmospheres  for 45 seconds.  Following confirmation of adequate patency in orthogonal  views both with and without the guide wire in place the guiding catheter was  removed.  Hemostasis was achieved by suturing the sheath in place.   The patient was transported to the recovery area in stable condition with an  intact distal pulse.  The patient did receive a double bolus of Integrelin  constant infusion during the procedure.  The initial ACT was 378 seconds.  The final ACT is pending.   ANGIOGRAPHY:  As mentioned the lesion treated was in the anastomosis of the saphenous vein  graft to the diagonal branch.  It was 90-95% stenotic.  Following balloon  dilatation and stent implantation there was no residual stenosis.   FINAL IMPRESSION AND PLAN:  1. Atherosclerotic coronary vascular disease, both native and autologous     vein graft.  2.  Status post successful percutaneous transluminal coronary angioplasty and     drug eluting stent implantation, saphenous vein graft-diagonal     anastomosis.  3. Typical angina was not reproduced with device insertion or balloon     inflation.                                               Francisca December, M.D.    JHE/MEDQ  D:  05/08/2002  T:  05/09/2002  Job:  [id number]   cc:   Meade Maw, M.D.  [address]  [place]  [place] [address]  Fax: [phone]   Theressa Millard, M.D.  [address] [place]  [place] [address]  Fax: [phone]

## 2010-09-05 NOTE — Letter (Signed)
March 04, 2006    Theressa Millard, M.D.  [address] [place], [place] [address]   RE:  BUBBER, ROTHERT  MRN:  [id number]  /  DOB:  June 23, 1930   Dear Rosanne Ashing:   Mr. Perreira comes in today. Unfortunately he is back in atrial flutter  following his second ablation. This had been done with ESI support which  identified a break in the flutter circuit at the junction of the inferior  vena cava and the cava tricuspid isthmus.   I have discussed with Mr. Dullea the importance of ridding him off the  flutter circuit. I have offered him three options, one of which was for me  to redo the procedure, one is for Dr. Ladona Ridgel to redo the procedure with  which I would assist, and the third would be to send him to different  hospital where different technologies might be able to be brought to bear.   He will ponder this and let us know when he gets back from Thanksgiving  vacation. We will go ahead and begin him now on Coumadin at 5 mg a day and  will check his level again on the Monday he returns.    Sincerely,      Duke Salvia, MD, [facility] [place]  Electronically Signed    SCK/MedQ  DD: 03/04/2006  DT: 03/04/2006  Job #: [id number]   CC:    Theressa Millard, M.D.

## 2010-09-05 NOTE — Cardiovascular Report (Signed)
NAME:  TU, BAYLE NO.:  [id number]   MEDICAL RECORD NO.:  [id number]                   PATIENT TYPE:  OIB   LOCATION:  2852                                 FACILITY:  [facility]   PHYSICIAN:  Meade Maw, M.D.                 DATE OF BIRTH:  September 26, 1930   DATE OF PROCEDURE:  DATE OF DISCHARGE:                              CARDIAC CATHETERIZATION   INDICATIONS FOR PROCEDURE:  Reversible ischemia in the anteroapical wall,  poor exercise tolerance.   DESCRIPTION OF PROCEDURE:  After obtaining written informed consent, the  patient was brought to the cardiac catheterization lab in the postabsorptive  state.  Preoperative sedation was achieved using IV Versed.  The right groin  was prepped and draped in the usual sterile fashion.  Local anesthesia was  achieved using 1% Xylocaine.  A 6-French hemostasis sheath was placed into  the right femoral artery using the modified Seldinger technique.  Coronary  angiography was performed.  The left main was engaged using a JL4.  The  right coronary artery was not engaged despite multiple attempts with a non-  torque, a JR4, and an RCB.  A saphenous vein graft to the obtuse marginal  was engaged using a JR4.  A saphenous vein graft to the PDA was engaged  using an RCB.  A saphenous vein graft to the diagonal was engaged using a  left coronary bypass graft.  The left internal mammary artery was engaged  using a LIMA catheter.  All catheter exchanges had to be made over a  guidewire.  The hemostasis sheath was placed following each engagement.  There was a stenotic lesion in the distal iliac.  This was not visualized.  An aortography was performed but was unable to pin to the distal iliac to  visualize this lesion.  The films were reviewed with Dr. Amil Amen, and it was  felt that the saphenous vein graft to the diagonal was accounting for his  change on Cardiolite.  The hemostasis sheath was sewn into place.  The  patient was transferred to the holding area.  It is anticipated that he will  proceed with intervention on the saphenous vein graft to the diagonal.   FINDINGS:  1. The aortography revealed luminal irregularities.  There was significant     lesion noted in his thoracic or aortic root portion.  2. Left main:  The left main artery bifurcates into the left anterior     descending.  There is no significant disease noted in the left main     coronary artery.  3. Left anterior descending:  The left anterior descending has severe     calcification in its proximal portion.  It gives rise to a moderate-sized     diagonal, and then it is 100% occluded.  In the first diagonal, there is     luminal irregularities up to 70-80% in the descending  limb.  4. The left internal mammary artery to the left anterior descending artery     is widely patent with good distal runoff.  The saphenous vein graft to     the second diagonal reveals a 95% anastomotic lesion.  5. Circumflex vessel:  The circumflex vessel is 100% occluded.  6. The saphenous vein graft to the obtuse marginal is a large graft, is     patent, and has good distal runoff.  7. The saphenous vein graft to the PDA remains patent with good distal     runoff.   FINAL IMPRESSION:  Saphenous vein graft to the diagonal, most likely  accounting for the reversible defect noted on Cardiolite.  The films were  reviewed with Dr. Amil Amen.  He will proceed with further intervention.                                               Meade Maw, M.D.    HP/MEDQ  D:  05/08/2002  T:  05/08/2002  Job:  [id number]

## 2010-09-05 NOTE — Op Note (Signed)
NAMECAMIL, [name]:  [id number]   MEDICAL RECORD NO.:  [id number]          PATIENT TYPE:  OIB   LOCATION:  6527                         FACILITY:  [facility]   PHYSICIAN:  Duke Salvia, MD, FACCDATE OF BIRTH:  03/03/1931   DATE OF PROCEDURE:  DATE OF DISCHARGE:  01/26/2006                                 OPERATIVE REPORT   PREOPERATIVE DIAGNOSIS:  Previous atrial flutter ablation with incomplete  cavotricuspid isthmus block.   POSTOPERATIVE DIAGNOSIS:  Previous atrial flutter ablation with incomplete  cavotricuspid isthmus block.   PROCEDURE:  Electroanatomical mapping and guidance for radiofrequency  catheter ablation of the cavotricuspid isthmus for atrial flutter.   DESCRIPTION OF PROCEDURE:  After obtaining informed consent, the patient was  brought to the electrophysiology laboratory and placed on the fluoroscopic  table in supine position.  After routine prep and drape, cardiac  catheterization was performed with local anesthesia and conscious sedation.  Noninvasive blood pressure monitoring and transcutaneous oxygen saturation  monitoring and end-tidal CO2 monitoring were performed continuously  throughout the procedure.  Following the procedure, the catheter was  removed.  Hemostasis was obtained and the patient was transferred to the  floor in stable condition.   Catheter is a 5-French quadripolar catheter inserted into the left femoral  vein to the AV junction to measure the His.  A 6-French octapolar catheter  is inserted via the right femoral vein to the coronary sinus.  A 9.5-French  ESI multielectrode array was inserted via the left femoral vein to the right  atrium.  A 4 mm 7-French deflectable tip ablation catheter is inserted via  the right femoral vein to mapping sites in posterior septal space.  Surface  leads I, aVF and V1 were monitored continuously throughout the procedure.  Following insertion of the catheter, the stimulation  protocol included  incremental atrial pacing from the coronary sinus.   RESULTS:  Surface electrocardiogram and pacing intervals.  Initial:  Rhythm:  Sinus RR interval 94 msec, PR interval 196 msec. QRS duration 174  msec. QT interval 497 msec. P wave duration: 111 msec.  Bundle branch block:  Present, high to the right.  Pre-excitation:  Absent.   Final:  Rhythm:  Sinus RR interval 94 msec.  PR interval 185 msec.  QRS duration 140  msec.  QT interval 442 msec.  P wave duration:  123 msec.  Bundle branch  block:  Present high to the right.  Pre-excitation absent.   Initial AH interval:  134 msec. HV interval:  46 msec.   Final AH interval:  138 msec.  HV interval msec.   The rest of the electrophysiological study had been done previously.   A radiofrequency energy was then delivered after the creation of a right  atrial geometry with the assistance of the endocardial array.  Rare beats  were seen to traverse the cavotricuspid isthmus and a line was defined at  about 6 o'clock on the tricuspid annulus and point-to-point mapping under  guidance of the endocardial ray were applied.  We were then able to  demonstrate that there is no  cavotricuspid isthmus conduction.  A total of 6  minutes and 29 seconds of radiowave energy was applied along the  cavotricuspid isthmus line.   Fluoroscopy time total is 26 minutes and 6 seconds.  Degree of fluoroscopy  time was utilized at 7.5 frames per second.   In summary, Mr. Twyford repeat electrophysiology study with  electroanatomical mapping facilitated cavotricuspid isthmus conduction block  being generated with radiofrequency energy.  The patient tolerated the  procedure well without apparent complication.  The patient was transferred  to the holding area for removal of his sheath.           ______________________________  Duke Salvia, MD, [facility]     SCK/MEDQ  D:  01/25/2006  T:  01/26/2006  Job:  [id number]   cc:   Theressa Millard,  M.D.  Despina Hick

## 2010-09-05 NOTE — Discharge Summary (Signed)
[name]  Patient:    [name], [name]                      MRN: [id number] Adm. Date:  [id number] Disc. Date: 05/03/00 Attending:  Mikey Bussing Dictator:   Sherrie George, P.A.                           Discharge Summary  DATE OF BIRTH:  08/18/1930  ADMISSION DIAGNOSES: 1. Chest pain, shortness of breath, atrial flutter, rule out myocardial    infarction. 2. Hypertension. 3. History of left retinal artery occlusion with visual field defect.  DISCHARGE DIAGNOSES: 1. Severe three-vessel coronary artery disease with class one stable angina,    with 50%-70% left main disease, 80%-90% left anterior descending coronary    artery stenosis, 80%-90% circumflex stenosis, 100% right coronary artery    stenosis, and 65% ejection fraction. 2. Recurrent atrial fibrillation. 3. Hypertension. 4. History of left retinal artery branch occlusion with visual defect.  PROCEDURE: 1. Cardiac catheterization on April 26, 2000, Dr. Meade Maw. 2. A 2-D echocardiogram on April 26, 2000, Dr. Fraser Din. 3. Coronary artery bypass grafting x 5 with the left internal mammary    to the LAD, saphenous vein graft to the diagonal, saphenous vein graft    to the first and second obtuse marginal sequentially, saphenous vein    graft to the posterior descending coronary artery on April 27, 2000,    Dr. Kathlee Nations Trigt III. 4. DC cardioversion on April 30, 2000, Dr. Fraser Din.  HISTORY OF PRESENT ILLNESS:  The patient is a 77 year old white male, a medical patient of Dr. Fayrene Fearing C. Osbornes, who presented with chest tightness, radiating to the left arm and shortness of breath.  This became progressive. An electrocardiogram showed atrial flutter with a 2:1 block, and a rate of 136.  The patient was subsequently admitted to rule out a myocardial infarction and for further treatment as indicated.  PAST MEDICAL HISTORY: 1. Hypertension. 2. History of  dyslipidemia. 3. History of retinal artery branch occlusion. 4. History of peripheral vascular occlusive disease. 5. Aortic sclerosis. 6. Tubular villus adenoma of the colon. 7. Lumbar laminectomy. 8. Trigeminal resection for tic douloureux. 9. Adenoma removed from his colon in 1996.  ALLERGIES:  No known drug allergies.  SOCIAL HISTORY:  He is married.  He discontinued tobacco use in 1988.  He denies alcohol use.  CURRENT MEDICATIONS ON ADMISSION: 1. Cardura 8 mg p.o. q.d. 2. Lipitor 20 mg p.o. q.d. 3. Hydrochlorothiazide 25 mg q.d. 4. Aspirin one q.d. 5. Altace 5 mg p.o. q.d.  For further history and physical, please see the dictated note.  HOSPITAL COURSE:  The patient was admitted.  He was in atrial flutter, and was placed on heparin, and attempts were made to convert the patient.  These failed.  He remained hemodynamically stable.  His chest pain was relieved with medical treatment.  He was kept on IV heparin.  He was taken to the cardiac catheterization laboratory on April 26, 2000, by Dr. Fraser Din.  This showed severe three-vessel coronary artery disease with 50%-70% left main disease. The ejection fraction was calculated at 65%.  At the completion of the study it was Dr. Faythe Ghee opinion that the patient should undergo surgery for revascularization.  The risks and benefits were discussed.  The patient was seen in consultation by Dr. Donata Clay who agreed that the  patient was a candidate for a coronary artery bypass grafting.  It was noted that his CPKs were slightly elevated, and it was their opinion that the patient had a subendocardial myocardial infarction.  The patient understood the risks and benefits and subsequently agreed to surgery.  He subsequently was taken to the operating room on April 27, 2000, and underwent a coronary artery bypass grafting as described above, using cardiopulmonary bypass and potassium cardioplegia for myocardial hypothermia. He  tolerated the procedure well and returned to the intensive care unit in satisfactory condition.  Postoperatively the cardiac index was good.  He remained stable overnight.  On the first postoperative morning the cardiac index was 2.5.  A chest x-ray was clear.  O2 saturations were 97% on 2 L nasal cannula.  There was no air leak.  Overall he was doing well.  He was diuresed and plans were made to transfer him to the step-down unit.  On April 29, 2000, on the second postoperative day he had been transferred to 2000.  He had gone back into atrial fibrillation with a heart rate in the 120s.  He was hemodynamically stable.  His blood pressures were stable.  Hemoglobin was 10 with a hematocrit of 30, white count 15.7, platelets 175,000.  He had one chest tube remaining, which had minimal drainage, and it was also removed. Because of his going back into atrial fibrillation, his parameters for his Lopressor were altered, and made somewhat lower.  This was being held.  It was hoped that the increased Lopressor would convert him back to a sinus rhythm.  The patient was seen by Dr. Fraser Din, and she started amiodarone.  He was started on phase I cardiac rehabilitation, and made good progress.  His biggest problem from this point on was his rhythm.  He went in and out of atrial fibrillation.  Amiodarone did not convert him, and he underwent a DC cardioversion on April 30, 2000.  He converted at the time of cardioversion, but then went right back to atrial fibrillation.  At this point, he was started on amiodarone.  Lopressor was subsequently discontinued.  He was also started on Lovenox and started on Coumadin.  He has continued to make good progress.  The loading of his amiodarone has been uneventful.  He continues to go in and out of atrial fibrillation.  His rates are controlled.  It was Dr. Ramon Dredge B. Gerhardts opinion, and that of cardiology, Dr. Francisca December, that the patient would be ready  to go home on May 03, 2000, pending his INR which needed to be 2.0 or greater.  DISCHARGE ACTIVITIES:  Light to moderate.  No lifting of over 10 pounds or driving.  No strenuous activity.   We will put his pacing wires today on May 02, 2000.  DISCHARGE MEDICATIONS: 1. Amiodarone 400 mg p.o. t.i.d. through May 06, 2000.  On May 07, 2000, start 200 mg p.o. b.i.d. 2. Digoxin 0.125 mg q.d. 3. Lopressor 25 mg p.o. b.i.d. 4. Multivitamin with iron one q.d. 5. Tylox one to two p.o. q.4h. p.r.n. pain.  FOLLOWUP:  The patient will return in one week for staple removal.  He will return on Tuesday or Wednesday for followup with Dr. Fraser Din, and to regulate his Coumadin with daily pro times.  He will return to see Dr. Fraser Din in two weeks for a cardiology followup.  He will see Dr. Donata Clay in three weeks with a chest x-ray from Dr. _______ office.  LABORATORY DATA:  Electrolytes normal.  BUN 22-24, creatinine down to 1.2 from previous high of about 1.7.  CONDITION ON DISCHARGE:  Improved. DD:  05/02/00 TD:  05/02/00 Job: [id number]

## 2010-09-05 NOTE — Discharge Summary (Signed)
NAMETYLLER, BOWLBY NO.:  [id number]   MEDICAL RECORD NO.:  [id number]          PATIENT TYPE:  INP   LOCATION:  3741                         FACILITY:  [facility]   PHYSICIAN:  Corky Crafts, MDDATE OF BIRTH:  1930/05/07   DATE OF ADMISSION:  10/20/2005  DATE OF DISCHARGE:  10/21/2005                                 DISCHARGE SUMMARY   DISCHARGE DIAGNOSES:  1.  Atrial flutter.  2.  Hypertension.  3.  High cholesterol.  4.  Chronic renal insufficiency.  5.  Hyperkalemia.  6.  Chronic metabolic acidosis.  7.  Reflux.   HOSPITAL COURSE:  The patient came to the hospital on July 3rd for an  elective outpatient cardioversion.  His preprocedure labs showed significant  hyperkalemia with a potassium level of 7.4.  He was subsequently admitted as  his creatinine was 2.1 with a baseline of 1.6.  He was hydrated for his  electrolyte abnormality as well as his acute on chronic renal failure.  He  was anticoagulated with Coumadin.  His electrolytes normalized with fluid.  On July 4th he underwent a DC cardioversion with successful restoration of  normal sinus rhythm from atrial flutter.  He tolerated the procedure well  and was discharged subsequently.   HOSPITAL PROCEDURES:  DC cardioversion.   DISCHARGE MEDICATIONS:  1.  Coumadin 5 mg daily.  2.  Lipitor 40 mg daily.  3.  Sotalol 80 mg b.i.d.  4.  Advair 100/50 one puff b.i.d.  5.  Bicitra b.i.d.  6.  Pepcid 10 mg daily.   SPECIAL INSTRUCTIONS:  The patient will follow a low potassium diet,  minimizing orange juice.   FOLLOWUP APPOINTMENTS:  He will followup with Dr. Hoyle Barr office for an  INR check on October 22, 2005.  Will also have a BMET done at that time.  The  patient will also make an effort to stay well hydrated and restart his  Bicitra which he had not been taking regularly.   ACTIVITIES:  The patient will increase his activity slowly.      Corky Crafts, MD  Electronically  Signed     JSV/MEDQ  D:  10/22/2005  T:  10/22/2005  Job:  [id number]

## 2010-09-05 NOTE — Discharge Summary (Signed)
NAME:  [name], [name]:  [id number]   MEDICAL RECORD NO.:  [id number]          PATIENT TYPE:  OIB   LOCATION:  3712                         FACILITY:  [facility]   PHYSICIAN:  Maple Mirza, PA   DATE OF BIRTH:  09-18-1930   DATE OF ADMISSION:  12/22/2005  DATE OF DISCHARGE:  12/23/2005                                 DISCHARGE SUMMARY   ADDENDUM TO DISCHARGE SUMMARY   He is going to return to see Dr. Sherryl Manges Wednesday, January 13, 2006  at noon, and then at that time he will be arranged to return to [facility] Friday, January 15, 2006 for [facility] mapping and radiofrequency  catheter ablation of an atypical atrial flutter.           ______________________________  Maple Mirza, PA     GM/MEDQ  D:  12/23/2005  T:  12/23/2005  Job:  [id number]   cc:   Duke Salvia, MD, [facility]

## 2010-09-05 NOTE — Discharge Summary (Signed)
[facility]  Patient:    [name], [name] Visit Number: [id number] MRN: [id number]          Service Type: MED Location: 3W [id number] 01 Attending Physician:  Thermon Leyland Dictated by:   Barron Alvine, M.D. Admit Date:  03/23/2001 Discharge Date: 03/26/2001                             Discharge Summary  DISCHARGE DIAGNOSES: 1. Acute renal failure. 2. Hyperkalemia. 3. Urinary retention. 4. Gross hematuria. 5. 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  PROCEDURES PERFORMED:  Cystoscopy with cystogram and Foley catheter insertion on March 25, 2001.  HISTORY OF PRESENT ILLNESS:  [name] is a 77 year old male.  His urologic history is complicated.  Much of this is documented in his admission history and physical.  In summary, the patient is approximately four to five weeks status post cystectomy with neobladder.  His catheter was removed and initially he was able to void with dribbling incontinence.  He subsequently developed urinary retention and significant hematuria.  Of note, he had been restarted on his anticoagulation.  When he presented to the emergency room he had not voided for several hours.  He was noted to have renal insufficiency with a creatinine of 2.4.  His INR was also 2.4.  His hemoglobin was reasonably stable at 11.8.  His potassium was also markedly elevated at 6.8. The patient underwent emergent evaluation which included attempt at catheter placement with cystogram and CT.  It was not convincing that his catheter was indeed within the neobladder.  We subsequently were able to get a catheter in that seemed to irrigate well and with that there was improvement in his laboratories.  His hematuria began to resolve and his creatinine slowly decreased to approximately 2.1.  On the second hospital day his potassium levels were normal and again hemoglobin was  reasonably stable, although it had drifted down to approximately 9.6.  We decided to take the patient to surgery to make sure he did not have any additional clots in the neobladder.  We performed a cystogram through the catheter that had been placed earlier and felt that it was probably not positioned within the neobladder.  More thorough evaluation with cystoscopy revealed what appeared to be a partial posterior disruption of the anastomosis between the urethra and the neobladder with a false passage and cavity underneath the neobladder.  With a guide wire we were able to get into the neobladder.  Once we were in there the neobladder itself looked to be in good shape.  We placed a large boar catheter within the neobladder, performed a repeat cystogram.  This showed no evidence of extravasation with excellent filling and no evidence of ureteral obstruction. With the catheter confirmed to be in good position the patients creatinine came down the next day to 1.8 and I suspect would return all the way to normal within the next 24-48 hours.  He remained completely asymptomatic throughout this and his other laboratory values were all normal.  He had reversal of his anticoagulation status with vitamin K and his INR was normal at the time of discharge.  DISPOSITION:  The patient was discharged home on hospital day #3 which was postoperative day #1.  At that point his catheter was draining very well and the urine was crystal clear.  His laboratory values were all  markedly improved and he also felt significantly better.  We will leave his catheter indwelling for at least a week and plan on probable repeat cystogram.  We will give him another voiding trial in the near future as well.  His medications were as before. Dictated by:   Barron Alvine, M.D. Attending Physician:  Thermon Leyland DD:  04/04/01 TD:  04/04/01 Job: [id number]

## 2010-09-05 NOTE — Op Note (Signed)
NAME:  ESCHER, HARR NO.:  [id number]   MEDICAL RECORD NO.:  [id number]          PATIENT TYPE:  OIB   LOCATION:  3712                         FACILITY:  [facility]   PHYSICIAN:  Duke Salvia, MD, FACCDATE OF BIRTH:  1930-09-25   DATE OF PROCEDURE:  12/22/2005  DATE OF DISCHARGE:                                 OPERATIVE REPORT   ELECTROPHYSIOLOGIST:  Duke Salvia, MD, [facility].   PREOPERATIVE DIAGNOSIS:  Atrial flutter.   POSTOPERATIVE DIAGNOSIS:  Atrial flutter - isthmus dependent.   PROCEDURE:  Invasive electrophysiological study, arrhythmia mapping, direct-  current cardioversion, radiofrequency catheter ablation.   DESCRIPTION OF PROCEDURE:  Following the obtaining of informed consent, the  patient was brought to the electrophysiology laboratory and placed on the  fluoroscopic table in a supine position. After routine prep and drape,  cardiac catheterization was performed under local anesthesia and conscious  sedation.  Noninvasive blood pressure monitoring and transcutaneous oxygen  saturation monitoring were performed continuously throughout the procedure.  Following the procedure, the catheters were removed, hemostasis was  obtained, and the patient was transferred to the holding area in stable  condition.   CATHETERS:  A 5-French quadripolar catheter was inserted via the left  femoral vein to the AV junction to measure the His electrogram, and  subsequently moved to the right ventricular apex.   A 6-French octapolar catheter was inserted via the right femoral vein into  the coronary sinus.   A 7-French duodecapolar catheter was inserted via the left femoral vessels  to the tricuspid annulus.   An 8-French x 8-mm deflectable tip ablation catheter was inserted into the  right femoral vein using an SL-2 sheath to mapping sites in the posterior  septal space.   Surface leads 1, aVF and V were monitored continuously throughout the  procedure.   Following insertion of the catheters, the stimulation protocol  included incremental atrial pacing, incremental ventricular pacing,  entrainment mapping.   RESULTS:  Surface electrocardiogram of basic intervals:  Initial: Rhythm:  Atrial flutter; R interval 689 msec; AA interval 245 msec;  PR interval n/a; QRS duration 149 msec; QT interval 365 msec; P wave  duration: n/a; preexcitation absent; bundle branch block present, right; AH  interval n/a; HP interval 52 msec.   Final: Sinus:  RR interval 768 msec; PR interval 202 msec; QRS duration 159  msec; QT interval 414 msec; P wave duration 155 msec; bundle branch block  present, right; preexcitation absent; AH interval 121 msec; HP interval 48  msec.   AV nodal function:  AV Wenckebach was less than 470 msec.  VA Wenckebach was  320 msec.   Accessory pathway function:  No evidence of an accessory pathway was  identified.   Arrhythmias:  The patient presented to the lab in atrial flutter.  entrainment mapping confirmed cavotricuspid isthmus dependence.  Radiofrequency energy was then delivered across the cavotricuspid isthmus  with the development of significant number of split atrial potentials.  There was progressive diminution of atrial voltages, and so at this point,  we elected to cardiovert the patient, which was accomplished  with 100 J  delivered synchronously.   Following the restoration of sinus rhythm, coronary sinus pacing was then  utilized to try to further map the cavotricuspid isthmus.  There was  significantly diminished voltage from 4 o'clock all the way out to 7 o'clock  across the cavotricuspid isthmus, where ablation had been previously  performed.  There were also split atrial potentials in sinus rhythm,  suggesting mapping on either side of the terminal ramification of the  eustachian ridge.  However, not withstanding this, there removed persistent  cavotricuspid isthmus conduction, which I was unable to  eliminate.   Radiofrequency energy:  A total of 42 minutes 35 seconds of RF energy was  applied across the cavotricuspid isthmus in multiple lines.  One pop was  heard.   Fluoroscopy time:  A total of 34 minutes 27 seconds fluoroscopy time was  utilized at 7 1/2 frames per second.   IMPRESSION:  1. Normal sinus function.  2. Abnormal atrial function manifested by sustained atrial flutter,      currently demonstrated to be typical, counterclockwise.      Electrocardiograms previously had suggested that there is also the      presence of clockwise atrial flutter.  3. Normal atrioventricular nodal function.  4. Normal His-Purkinje system function, apart from right bundle branch      block.  5. No accessory pathway.  6. Normal ventricular response to programmed stimulation.   SUMMARY:  The results of the electrophysiology study confirm cavotricuspid  isthmus conduction for the patient's typical-appearing atrial flutter.  However, I was unable to successfully eliminate cavotricuspid isthmus  conduction.  Given the amount of RF and fluoroscopy time that were used, it  was elected to terminate the procedure.  Maybe there will be further healing  with elimination of cavotricuspid isthmus conduction; if there is recurrence  of atrial flutter, I would recommend repeating the procedure with the ESI  array.           ______________________________  Duke Salvia, MD, [facility]     SCK/MEDQ  D:  12/22/2005  T:  12/22/2005  Job:  [id number]   cc:   Theressa Millard, M.D.  Electrophysiology Laboratory  Corky Crafts, MD

## 2010-09-05 NOTE — Discharge Summary (Signed)
NAMEZAYLON, [name]:  [id number]   MEDICAL RECORD NO.:  [id number]          PATIENT TYPE:  OIB   LOCATION:  3712                         FACILITY:  [facility]   PHYSICIAN:  Duke Salvia, MD, FACCDATE OF BIRTH:  04/09/1931   DATE OF ADMISSION:  12/22/2005  DATE OF DISCHARGE:  12/22/2005                                 DISCHARGE SUMMARY   ALLERGIES:  ALTACE, WHICH CAUSES MOUTH SWELLING.   PRINCIPAL DIAGNOSES:  1. Atrial flutter, failed DCCV in two separate occasions in the past, the      last one October 20, 2005.      a.     Progressive exercise intolerance.      b.     Atypical atrial flutter.      c.     Attempted ablation September 4.  Unable to which she       cavotricuspid isthmus block.      d.     Discontinued sotalol.   SECONDARY DIAGNOSES:  1. Coronary artery disease.      a.     Coronary artery bypass graft surgery 2001.      b.     Subsequent BCI in 2004.  2. Hypertension.  3. Chronic obstructive pulmonary disease.  4. Gastroesophageal reflux disease.  5. Myoview study in January 2007 with ejection fraction 65-70%.  6. History of cancer.      a.     Prostate cancer.      b.     Colon cancer.      c.     Bladder cancer.  7. Vitamin B12 deficiency.  8. Neobladder formation in fall 2002, status post prostate cancer surgery      in 2001, status post colon cancer surgery in 2004.   PROCEDURE:  December 22, 2005:  Electrophysiology study, attempted  radiofrequency catheter ablation of an entrained atrial flutter from the  cavotricuspid isthmus.  Failure to create a cavotricuspid isthmus block.   BRIEF HISTORY:  Mr. [name] is a 77 year old male, who has a history of  coronary artery disease.  He had coronary artery bypass graft surgery in  2001.  He had subsequent percutaneous intervention in 2004.   He apparently developed atrial flutter around the time of his coronary  artery bypass and has had recurrence on at least two occasions, for which  he  underwent cardioversion.   The patient has no history of palpitation.  He does have exercise  intolerance.  He has a hard time climbing a flight of stairs without  becoming dyspneic.  His wife states that it is worse now than it was 3-6  months ago.  He does not have nocturnal dyspnea or peripheral edema.  The  patient is currently on sotalol, and in discussion with Dr. Graciela [name], he  expressed the wish that he would like to discontinue sotalol as well as his  chronic Coumadin therapy.  Risks and benefits of radiofrequency catheter  ablation were discussed with the wife and he wishes to proceed.   HOSPITAL COURSE:  The patient presented to [facility] on  September  4 electively.  After elective presentation on September 4, he underwent  electrophysiology study for his atypical atrial flutter.  Efforts to  catheter ablate this dysrhythmia were unsuccessful despite several maneuvers  in order to entrain the dysrhythmia.  The patient will probably be  discharged on September 4.  His sotalol will be discontinued.  He will  continue his Coumadin and follow-up with Dr. Graciela [name] will be arranged.   Laboratory studies pertinent to this admission:  Serum electrolytes:  Sodium  142, potassium 4.9, chloride 112, carbonate 24, glucose 87, BUN 56,  creatinine 1.9.  This was a sample collected August 30.  The PTT was 35.8.  His PT was 18.4, INR 2.1.  Complete blood count:  White cells 8.7,  hemoglobin 15.4, hematocrit 46.8, platelets 243.   DISCHARGE MEDICATIONS:  The patient was discharged on his prior medications  except for sotalol.  This would be:  1. Lipitor 40 mg daily at bedtime.  2. Coumadin 5 mg daily.  3. Cardia XT 120 mg daily.  4. Vitamin B12 injections monthly.  5. Bicitra 15 mL twice daily.   He is to hold his sotalol.   DISCHARGE INSTRUCTIONS:  He was asked not to drive for next two days and not  to lift anything heavier than 10 pounds for the next two weeks.      ______________________________  Maple Mirza, PA    ______________________________  Duke Salvia, MD, [facility]    GM/MEDQ  D:  12/22/2005  T:  12/23/2005  Job:  [id number]   cc:   Corky Crafts, MD  Theressa Millard, M.D.

## 2010-09-05 NOTE — Discharge Summary (Signed)
[facility]  Patient:    [name], [name] Visit Number: [id number] MRN: [id number]          Service Type: MED Location: 3W 0378 01 Attending Physician:  Thermon Leyland Dictated by:   Barron Alvine, M.D. Admit Date:  03/23/2001 Discharge Date: 03/26/2001                             Discharge Summary  DISCHARGE DIAGNOSES: 1. 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 2. Gross hematuria. 3. Urinary retention.  HOSPITAL COURSE:  [name] is a 77 year old male.  He underwent a transurethral resection of a muscle-invasive bladder tumor by myself prior to this admission.  He contacted Dr. Darvin Neighbours with complaints of inability to void.  He had increased bleeding per urethra with increased suprapubic pain and pressure for 24-48 hours.  Dr. Earlene Plater admitted the patient.  A repeat catheter was placed and numerous clots were obtained.  The bladder was irrigated and he was admitted for three-way irrigation with observation.  The following morning, the patients urine was clearer.  His hemoglobin was 11.1 with a hematocrit of 31.8.  We saw the patient on hospital day #2.  His urine was clearer.  He still had a few older dark clots.  He remained stable with an unremarkable abdomen.  We also discussed with the patient in length the fact that he did have muscle invasive transitional cell carcinoma of the bladder and what that entailed with regard to additional workup and potential treatment in the near future.  An abdominal and pelvic CT scan was performed to rule out obvious bladder extension of the tumor or metastatic disease and things looked unremarkable.  His urine remained clear and we gave him a voiding trial.  He was able to void, and therefore the patient was discharged home.  DISPOSITION:  The patient was discharged to home.  He was continued on Cipro, as well as pain medicine.  He was told to hold his Coumadin and aspirin.   We will set up a followup for him on October 22 at 4:20 p.m. for a followup and sooner as necessary. Dictated by:   Barron Alvine, M.D. Attending Physician:  Thermon Leyland DD:  04/25/01 TD:  04/25/01 Job: [id number]

## 2010-09-05 NOTE — Op Note (Signed)
NAME:  [name], [name]:  [id number]   MEDICAL RECORD NO.:  [id number]                   PATIENT TYPE:  INP   LOCATION:  E454                                 FACILITY:  [facility]   PHYSICIAN:  Gita Kudo, M.D.              DATE OF BIRTH:  December 18, 1930   DATE OF PROCEDURE:  DATE OF DISCHARGE:                                 OPERATIVE REPORT   PROCEDURE:  Proctoscopy, rigid; resection left colon with primary  anastomosis.   SURGEON:  Gita Kudo, M.D.   ASSISTANT:  Donnie Coffin. Samuella Cota, M.D.   ANESTHESIA:  General endotracheal.   PREOPERATIVE DIAGNOSES:  Biopsy proven adenocarcinoma to 25 cm.   POSTOPERATIVE DIAGNOSES:  Biopsy proven adenocarcinoma to 25 cm, no residual  villous adenoma on proctoscopy.   CLINICAL SUMMARY:  A 77 year old male admitted for surgery. He has a biopsy  proven carcinoma to 25 cm. He also has had a villous adenoma removed from  just inside the anorectum. Of note is the fact that he has had bladder  cancer treated by radical cystectomy with creation of a neobladder.   FINDINGS:  The sigmoidoscopy was performed to 25 cm and I saw no evidence of  any colonic pathology. Careful examination both digitally and visually did  not reveal any residual villous adenoma. The patient had multiple adhesions  from his previous surgery. He had a very redundant left colon which was  noted on a preop barium enema. We could not feel the lesion. We did not see  it on open colotomy with proctoscopy. We did see it on the specimen that I  removed.   DESCRIPTION OF PROCEDURE:  Under satisfactory general endotracheal  anesthesia, having received IV heparin and Cefotan, the patient was placed  in the lithotomy position and proctoscoped with the findings mentioned  above. Then a catheter was placed through the penis into the neobladder and  was inflated without tension but not much urine came back. Because of that,  we called the urologist  to come in and help [place] later. Then he was prepped  and draped in a standard fashion and the midline incision previously made  was reopened and extended. Care was taken avoiding the adhesions to the  abdominal wall of the small bowel and adhesions were pain stakingly taken  down and then good exposure obtained. We dissected free the redundant colon  from the splenic flexure to the rectum. It was a little distended but  careful palpation did not reveal any pathology. Accordingly, we transected  the left colon in the descending colon area between clamps and the mesentery  divided between clamps and ties of 2-0 silk down to the distal sigmoid. At  that point, a colotomy was made and sterile proctoscope placed. I did not  see the lesion nor could I palpate it.   The colotomy was closed temporarily with a running silk suture  and the  dissection carried downward to the distal sigmoid and when I was sure that I  was above 25 cm, the bowel was divided between clamps and removed. The  specimen was then opened and approximately 1 cm from the distal margin, I  noted a scar. This was the only pathology I saw. I marked it with a suture  and sent it to the laboratory and talked to the pathologist.   Meanwhile since this was closed, we freed the rectosigmoid and removed  another 3 cm. Following this, an end-to-end anastomosis was performed after  obtaining hemostasis with 2-0 silk suture ligature. The anastomosis was  interrupted 2-0 silk simple, mattress and [place]. When the proximal spring  clamp was removed, the gas came through the anastomosis well. The  anastomosis was patent and hemostasis was good. Gloves were again changed  and the abdomen lavaged with saline. The laparotomy for placement of the NG  tube was felt to be in good position, the liver looked and felt normal. The  great vessels also felt fine without any definite aneurysm. The mesentery  was closed with interrupted 3-0 silk suture and  a small diverticulum near  the anastomosis was inverted with a 3-0 silk suture x2. After the abdomen  was checked for hemostasis, sponge and needle counts correct, the wound  closed with a running #1 PDS suture followed by staples for the skin.  Sterile absorbent dressings applied and the patient went to the recovery  room from the operating room in good condition without complication.                                                Gita Kudo, M.D.    MRL/MEDQ  D:  08/08/2002  T:  08/08/2002  Job:  [id number]   cc:   Tasia Catchings, M.D.  [address]  [place]  [place] [address]  Fax: [phone]   Theressa Millard, M.D.  [address] [place]  [place] [address]  Fax: [phone]   Valetta Fuller, M.D.  [address]  [place]  [place] [address]  Fax: [phone]

## 2010-09-05 NOTE — H&P (Signed)
[facility]  Patient:    [name], HARR Visit Number: [id number] MRN: [id number]          Service Type: SUR Location: 3W 0381 01 Attending Physician:  Thermon Leyland Dictated by:   Barron Alvine, M.D. Admit Date:  02/23/2001   CC:         Meade Maw, M.D.             Theressa Millard, M.D.                         History and Physical  CHIEF COMPLAINT:  Bladder cancer, for radical cystoprostatectomy and urinary diversion today.  HISTORY OF PRESENT ILLNESS:  Mr. Mittelstaedt is a 77 year old male.  Earlier this year he had bypass surgery.  He has done well from that procedure.  He has had some atrial arrhythmias and was on anticoagulation.  With anticoagulation he developed some painless, asymptomatic gross hematuria.  He was eventually diagnosed with muscle-invasive transitional cell carcinoma of the bladder. The patient had multifocal disease with superficial muscle invasion at the bladder neck in separate areas, which showed laminal propria invasion as well as carcinoma in situ.  There was no evidence of locally advanced or metastatic disease.  The patient was subsequently converted to normal sinus rhythm and is off anticoagulation.  We recommended radical cystoprostatectomy.  The patient also underwent extensive counseling with regard to the type of urinary diversion, and elected to have a neobladder after much discussion as well as second opinion consultations.  He is doing well at this time and is relatively asymptomatic, other than some mild voiding symptoms.  PAST MEDICAL HISTORY:  Coronary artery disease.  He appears to have reasonably good ventricular function.  He has been in normal sinus rhythm since his cardioversion.  MEDICATIONS:  He remains on amiodarone, Toprol XL 25, Altace, as well as Lipitor.  He has been on some Flomax.  ALLERGIES:  None.  SOCIAL HISTORY:  The patient has a previous tobacco use history but quit about 20  years ago and does not consume alcohol.  FAMILY HISTORY:  Negative for any GU malignancies, and positive for some heart disease.  REVIEW OF SYSTEMS:  Mostly notable for some mild irritative voiding symptoms and mild intermittent hematuria.  PHYSICAL EXAMINATION:  GENERAL:  Well-developed, well-nourished male.  VITAL SIGNS:  His current weight is 190 pounds, and he is 5 feet 10 inches tall.  Blood pressure on admission was 110/62, heart rate 56, afebrile.  NECK:  No evidence of JVD or masses.  CHEST:  Clear to auscultation.  ABDOMEN:  Soft and flat.  A mark had been placed for a possible ileal stoma if necessary in the right quadrant of the abdomen.  GENITOURINARY, RECTAL:  External genitalia revealed no significant abnormalities.  Scrotum, testes, and adnexal structures normal.  Prostate was 1+ without nodules or induration.  EXTREMITIES:  No edema.  LABORATORY DATA:  The patients preoperative hemoglobin was 12.6.  His BMET was unremarkable with a creatinine of 1.4.  ASSESSMENT:  Muscle-invasive transitional cell carcinoma of the bladder.  The patient will be admitted today after his cystoprostatectomy with pelvic lymph node dissection and ileal neobladder. Dictated by:   Barron Alvine, M.D.  Attending Physician:  Thermon Leyland DD:  02/25/01 TD:  02/26/01 Job: [id number]

## 2010-09-05 NOTE — Op Note (Signed)
[facility]  Patient:    [name], [name] Visit Number: [id number] MRN: [id number]          Service Type: SUR Location: 2S 0254 01 Attending Physician:  [name] Dictated by:   [name], M.D. 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  OPERATION:  Pelvic exploration, radical cystoprostatectomy, pelvic lymph node dissection, ileostuder neobladder.  SURGEON:  [name], M.D.  ASSISTANT:  [name], M.D.  ANESTHESIA:  General endotracheal.  INDICATIONS:  Mr. [name] has been recently diagnosed with muscle invasive transitional cell carcinoma of the bladder.  He is a 77 year old male who had gross hematuria on anticoagulation.  Cystoscopy revealed multiple bladder tumors which were subsequently resected.  The largest of these at the bladder neck on the right did demonstrate superficial muscle invasive disease.  One of the other tumors showed superficial lamina propria invasion and the patient also had associated carcinoma in situ.  The patient underwent extensive counselling with regard to treatment options.  We felt that given the muscle invasive disease as well as other areas of the bladder that showed superficial invasion along with carcinoma in situ, that he was at very high risk.  We did talk about other options which included aggressive TUR of the bladder, followed by intravesical therapy and very close follow up.  A CT scan was performed which showed no evidence of metastatic disease nor was there evidence of obvious disease extending beyond the bladder.  The patient also received a second opinion from Dr. Gaynelle [name].  The patient also underwent extensive counselling and second opinion with regard to  management of his urinary diversion.  We spent considerable time discussing the pros and cons of ileoureter as a conduit versus a neobladder.  The patient eventually chose neobladder.  He understands the risks as well as the benefits of this type of approach.  He was told that with surgery of this type, certainly anything including operative or perioperative death can occur.  We talked about potential pulmonary or cardiac complications. He understands the other potential problems with this type of surgery including significant bleeding, abdominal or wound infection, DVT and/or pulmonary embolus, ureteral obstruction, prolonged urinary leak, etc.  The patient also understands that with the neobladder, it is difficult to predict continence.  He is told that approximately 80% of patients will have reasonable daytime continence given enough time to heal and for maturation of the neobladder.  He is told that nighttime leakage is more common.  He understands that there is a potential for hypercontinence and that intermittent catheterization is a possibility in this type of situation.  He also understands that he may need to manage a catheter or catheters for irrigation of his bladder if excessive mucus does occur.  The patient again appeared to understand all of these issues.  We also discussed the issues with regards to sexual functioning and he understands that he will almost certainly be impotent after the procedure but that options will be available for him if he wants to pursue sexual functioning.  We felt the chance of cure was relatively high but he understands that there could be potentially disease extending beyond the confines  of the bladder or micrometastatic disease not picked up on imaging studies.  TECHNIQUE AND FINDINGS:  The patient was brought to the operating room.  He did have a full preoperative bowel preparation.  He also saw a stomal therapist for marking in case an ileal  loupe diversion was necessary.  He received preoperative antibiotics.  The patient was brought to the operating room where he had successful induction of general endotracheal anesthesia.  He was then placed in the straight supine position with compression boots.  A nasogastric tube was also utilized.  A standard lower midline incision was then performed and brought out just to the umbilicus superior aspect of.  The midline fascia was incised and the retropubic space was entered.  The bladder was dissected free and carefully palpated.  It did not appear to be fixed and the prostate was relatively small.  The posterior sheath was opened and the peritoneum cavity was opened. There were a few adhesions around the right colon but nothing really significant.  Palpation of the abdomen revealed no obvious abnormalities.  The NG tube was confirmed to be in good position.  The urachal remnant was then excised laterally on both sides to allow for some traction on the dome of the bladder.  A self retaining Omni retractor was utilized and the peritoneal wings were taken down identifying the obliterated umbilical artery and vas deferens which were clipped.  A ligature device was used for much of this.  We were able to open up the retroperitoneum, identify both ureters which appeared normal.  There were no palpable abnormalities and these were followed down to the level of the bladder where they were clipped. Distal ends of the ureter were sent for frozen section and these turned out to be benign ureter bilaterally.  We then came across the peritoneum at the junction of the bladder and the rectum, just on the rectal surface to allow for the posterior plane to be established.  This plane appeared to be unremarkable.  By tenting up the bladder with the hand, we were able to identify the lateral pedicles of the bladder.  Larger vessels were clipped with Weck clips and a ligature device was used to take down  the lateral and posterior pedicles of the bladder and prostate bilaterally.  Attention was then turned anteriorly where the endopelvic fascia was incised bilaterally and  this allowed [place] to palpate the apex of the prostate.  The puboprostatic ligaments were then taken down sharply.  Utilizing a large right angle clamp we were able to encircle the dorsal vein complex of the prostate, which was then doubly ligated and then suture ligated utilizing a prostate retractor. The dorsal vein complex was then divided and the underlying urethra was identified at the prostatic apex.  The urethra was dissected up into the apex of the prostate to leave a nice urethral stump. This was then transected anteriorly and the Foley catheter was brought out through the prostatic apex and the posterior aspect of the urethra was then transected.  A small amount of flimsy tissue was then noted posteriorly which was taken down with the ligature.  Throughout this, we did have some oozing from the dorsal vein. There was also some oozing from the lateral pelvic side wall and these were handled with clips and electrocautery.  Blood loss at this point was approximately 6 to 800 cc.  The patient started with a hemoglobin of approximately 12 and had been typed and crossed for two units.  He remained stable but we began to give him a slow transfusion.  Once the bladder was removed, we sent the prostatic urethra down for frozen sections.  They shaved the entire aspect of the apex and did not see any evidence of prostate cancer nor was there any evidence of transitional cell carcinoma.  We then identified the urethral stump utilizing a Grunwald urethral sound.  With the sound in position, we were able to place five 2-0 PDS sutures anatomically.  Two were placed anteriorly, two posteriorly and one directly at the 6:00 position.  These were marked to identify them later and then carefully placed on the operative field and  covered to allow for the anastomosis to be formed at a later date.  We then carefully palpated the pelvic lymph nodes which were not enlarged.  The tumor that was most worrisome was on the right side of the bladder neck.  We did do a right sided pelvic lymph node dissection which were sent for permanent section only.  Attention was then turned towards the neobladder.  We freed up some adhesions from the cecum and then identified the terminal ileum.  We retained the most distal 10 to 15 cm of terminal ileum and based our neobladder on ileocolic artery.  Approximately 70 cm of ileum was marked.  Taking great care to keep the orientation, we marked out the mesentery and then transected the distal portion of the ileum once the mesentery was clear.  We then isolated the 70 cm segment utilizing two GIA stapling devices.  Bowel continuity was then reestablished utilizing a third GIA stapler in a side to side with functionally end-to-end manner.  A large donut was noted and no significant bleeding was noted within the bowel.  A TA-60 stapler was then used to close the bowel.  The mesenteric trap was closed with some interrupted silk sutures. The 70 cm section of ileum was then opened.  The most distal 60 cm were opened and the more proximal 10 to 12 cm were left open for the ureteral reimplantation.  The bowel was carefully irrigated.  We then folded the ureter into a U shape. The posterior wall was then sutured utilizing 2-0 PDS suture. This was done in a running manner.  This was done by suturing the most distal 30 cm to the more proximal 30 cm again leaving the 10 cm of tubular bowel for the ureteral reimplantation.  We then had a large plate of ileum.  Attention was then turned towards the ureteral reimplantation.  The left ureter was brought underneath the sigmoid colon into the right portion of the hemipelvis. The most proximal aspect of the ileum was then used for the anastomosis of  the ureters.  This was done in a standard Bricker manner.  Urinary diversion stents were used.  Both ureters had dilated very nicely with the clips. The clips were removed and the ureters were cut with Potts Smith scissors and spatulated.  The ureteral anastomoses were done with two running 4-0 PDS suture again over the urinary diversion stents.  The stents were then anchored in the ureters with some 5-0 chromic suture to provide increased assurance there would be less likelihood of migration of the stents.  We then identified the most dependent portion of the ileal plate that had been sutured posteriorly.  This was then brought up in a clam shell manner in the midline with another PDS suture.  This left two suture lines to be closed, which were  closed with two running PDS sutures.  As the closure occurred, we placed a separate cecostomy tube in the anterior portion of the neobladder.  This was where the stents were brought out.  The suprapubic neobladder tube was an 6 French Foley catheter which was brought out through a separate stable incision in the right lower quadrant of the abdomen and urinary diversion stents were brought outside along this suprapubic catheter.  We then completed the closure of the neobladder.  The anterior closure was left open right at the most dependent portion which then provided [place] for a 22 to 24 [place] opening to place the urethral catheter.  A large 24 [place] hematuria catheter was then inserted through the penis and out of the pelvis and into this opening in the most dependent portion of the suture line to form the neobladder neck of the neobladder.  The previously placed urethral PDS sutures were then placed anatomically within the neobladder over this 24 catheter. The balloon was inflated to approximately 15 cc.  In this manner, we had a 24 French urethral catheter within the neobladder as well as an 31 [place] separate tube coming out through the right  lower quadrant which the stents exited.  The neobladder was distended to approximately 250 cc of saline. It appeared to be water tight.  We saw no obvious leaks.  Utilizing gentle traction on the urethral catheter, we then tied the sutures completing the anastomosis and the bladder was then reirrigated through both catheters.  A Blake drain was then placed in the pelvis through a separate stab incision in the left lower quadrant of the abdomen.  The entire pelvis was copiously  irrigated with antibiotic solution.  Our bowel anastomosis was checked and the bowel was carefully inspected.  The neobladder remained quite healthy and pink and viable throughout this.  The patient continued to have a small amount of oozing but remained stable.  After copious irrigation, we went ahead with closure.  Two separate running #1 PDS sutures were utilized for the closure and the skin was closed with clips.  The wounds were all dressed.  The urethral catheter was left to gravity drainage.  The suprapubic neobladder catheter along with the stents were connected with some skin adapters and also left to gravity drainage.  Estimated blood loss was approximately 2000 cc. The patient received 3 units of packed blood cells.  He again remained quite stable.  The patient was then transferred to recovery room.  All sponge and needle counts were correct.  No obvious complications occurred. Dictated by:   [name], M.D. Attending Physician:  [name] DD:  02/23/01 TD:  02/25/01 Job: [id number]

## 2010-09-05 NOTE — Letter (Signed)
December 17, 2005     Corky Crafts, MD.  [address]  [place] [place] [address]   RE:  Parker, [name]  MRN:  [id number]  /  DOB:  [birth year]   Dr. Vonna Kotyk:   It was a pleasure to see your patient, [name], in consultation today  regarding atrial flutter.   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  He apparently developed flutter around the time  of his bypass and then had recurrent flutter on at least two occasions for  which he underwent cardioversion.  Review of these electrocardiograms is  described below.   The patient has no palpitations.  The patient, however, does have exercise  intolerance.  He is unable to climb a flight of stairs without dyspnea.  It  is his wife's impress that this is worse now than it was 3-6 months ago.  He  does not have nocturnal dyspnea or peripheral edema.   His thromboembolic risk factors are notable for hypertension but are  negative for diabetes, heart failure, TIA or age, apart from the issue of  shortness of breath as noted above.   His most recent ejection fraction was 65-70% by Myoview scanning January of  2007.   PAST MEDICAL HISTORY:  1. COPD.  2. Gastroesophageal reflux disease.  3. Urinary problems.  4. Cancer      a.     Prostate.      b.     Colon      c.     Bladder   PAST SURGICAL HISTORY:  1. Neobladder formation in fall of 2002 for bladder cancer.  2. Prostate cancer surgery in 2001.  3. Colon cancer surgery in 2004.   REVIEW OF SYSTEMS:  Negative apart from above.   CURRENT MEDICATIONS:  1. Lipitor 40.  2. Coumadin 5.  3. Cartia 120.  4. Sotalol 80 b.i.d.  5. __________ 30 mL a day.   ALLERGIES:  He is allergic to ALTACE.   SOCIAL HISTORY:  He is married.  He has three children and six  grandchildren.  He is retired.  He does not use  cigarettes, alcohol or  recreational drugs.   PHYSICAL EXAMINATION:  GENERAL APPEARANCE:  He is an elderly Caucasian male  appearing his stated age of 27.  VITAL SIGNS:  Blood pressure today was 125/82 with a pulse of 77.  HEENT:  No icterus or xanthomata.  NECK:  Neck veins were 7-8 cm.  Carotids were brisk and full bilaterally  without bruits.  BACK:  Without kyphosis or scoliosis.  LUNGS:  Clear.  CARDIOVASCULAR:  Heart sounds were irregular with a 2/6 systolic murmur  heard, most prominently on long RRR intervals.  ABDOMEN:  Soft with active bowel sounds without midline pulsation or  hepatomegaly.  He has a well-healed epigastric scar.  VASCULAR:  Femoral pulses were 2+, distal pulses were intact.  EXTREMITIES:  There was no clubbing, cyanosis, or edema.  NEUROLOGIC:  Grossly normal.  SKIN:  Warm and dry.   Electrocardiogram dated today demonstrated atrial flutter with an atrial  cycle length of about 260 msec.  There was a right bundle branch block.  There are inferior Q-waves consistent with a prior inferior wall MI.   Electrocardiogram courteously and kindly included in the notes dated from  November 04, 2005,  demonstrates an atrial flutter with a different morphology  suggestive of possibly a clockwise flutter with negative flutter waves in  lead V1 and upright flutter wave in lead II.   IMPRESSION:  1. Recurrent atrial flutter possibly reverse typical as well as typical.  2. Thromboembolic risk factors notable for hypertension.  3. Ischemic heart disease.      a.     Status post coronary artery bypass grafting.      b.     Normal left ventricular function.  4. Multiple cancers as noted previously.   Vonna Kotyk, [name] and his wife and I had a lengthy discussion regarding his  symptoms and his thromboembolic risk.  We discussed the potential benefits  as well as the potential risks of sotalol therapy, Coumadin therapy and EP  study and RF catheter ablation.  Given his lack of  palpitations, the  attributable symptoms I think will include his exercise tolerance.  Certainly his thromboembolic risk is likely reduced by catheter ablation  therapy as well as his need for a potential __________ like sotalol.   After reviewing this with him and his wife, he would like to proceed with  catheter ablation.  We have reviewed the potential benefits as well as  potential risks of that including, but not limited to, 1:1000 risk of death,  1:100 risk of heart block requiring pacemaker implantation. They understand  these risks and would like to proceed.  He is currently on Coumadin with  well maintained INRs and he will be maintained on this throughout the  duration of his study and up until about four weeks afterwards.   Again, thank you very much for asking [place] to see him.    Sincerely,      Duke Salvia, MD, [facility]   SCK/MedQ  DD:  12/17/2005  DT:  12/17/2005  Job #:  [id number]   CC:    Theressa Millard, MDss

## 2010-09-05 NOTE — Consult Note (Signed)
[name]  Patient:    [name], [name]                        MRN: [id number] Adm. Date:  04/23/00 Attending:  Meade Maw, M.D.                          Consultation Report  REFERRING PHYSICIAN:  Winn Jock. Earl Gala, M.D.  REASON FOR CONSULTATION:  Chest pain associated with atrial flutter.  PROBLEM LIST: 1. Tubular villous adenoma. 2. Hypertension. 3. Retinal artery branch occlusion. 4. Peripheral vascular disease. 5. Aortic sclerosis.  HISTORY:  [name] is a 77 year old gentleman who presented to the office this a.m. with complaints of chest tightness radiating to the left arm associated with shortness of breath.  The patient first noted this onset approximately at Thanksgiving as it had been occurring every other week until the past two weeks.  It is now occurring on a weekly basis.  He presented to the office this a.m. following a prolonged episode of chest tightness radiating to the left arm and associated with shortness of breath.  The chest tightness persisted for approximately 20 minutes.  While in the office an EKG was performed.  The patient was noted to be in an atrial flutter with 2:1 block at a rate of 136 beats/minute.  Mr. Hartnett has remained active, continues to walk daily approximately two miles per day without exacerbation of the chest tightness or shortness of breath.  He last did his exercise on January 2 without difficulty.  Coronary risk factors include male, age, hypertension, dyslipidemia.  No history of diabetes.  FAMILY HISTORY:  Significant for father age 40 having coronary arterial bypass surgery at age 6, diet controlled diabetes.  Mother passing with abdominal cancer.  He has two brothers who are alive and well.  REVIEW OF SYSTEMS:  He has occasional orthopnea, occasional PND.  He is unable to predict when the PND will occur.  He does not note the palpitations of tachy arrhythmias.  He has not noted any  change in his stool.  He has not noted any black stools or hematochezia.  He has no significant alcohol intake and his diet has been unchanged.  PAST MEDICAL HISTORY:  Significant for problem list as noted above.  PAST SURGICAL HISTORY: 1. Lumbar laminectomy. 2. Trigeminal resection for tick douloureux. 3. Toe surgery. 4. Adenoma in December 1996.  ALLERGIES:  No known drug allergies.  SOCIAL HISTORY:  The patient is married.  No tobacco use since 1988. Occasional alcohol drink only.  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  He remains active and independent.  CURRENT MEDICATIONS: 1. Cardura 8 mg p.o. q.d. 2. Lipitor 20 mg p.o. q.d. 3. Hydrochlorothiazide 25 mg p.o. q.d. 4. Aspirin. 5. Altace 5 mg p.o. q.d.  PHYSICAL EXAMINATION:  GENERAL:  Elderly male in no acute distress.  VITAL SIGNS:  Blood pressure 120/80, afebrile, heart rate 71 beats per minute.  HEENT:  Unremarkable.  No evidence of xanthelasma.  NECK:  Good carotid upstrokes.  No carotid bruits.  Thyroid is not palpable.  PULMONARY:  No use of accessory muscles.  Breath sounds are equal and clear to auscultation.  CARDIOVASCULAR:  Regular rate and rhythm.  There is a 2/6 ejection murmur noted at the right upper sternal border.  PMI is not displaced.  ABDOMEN:  Soft and benign, nontender.  No unusual bruits  or pulsations are noted.  No obvious masses are noted.  EXTREMITIES:  No peripheral edema.  Distal pulses are weak, but palpable bilaterally.  NEUROLOGIC:  Nonfocal.  Motor 5/5 throughout.  SKIN:  Warm and dry.  No peripheral edema is noted.  RECTAL:  Patient was checked for guaiac positive stool in the clinic and was noted to be negative.  His chart was reviewed.  The patient was noted to be in atrial flutter while in the office.  His current CT reveals a normal sinus rhythm, mild left atrial enlargement, nonspecific T-wave abnormality.  LABORATORIES:  Currently  pending.  IMPRESSION: 1. Chest pain associated with atrial flutter.  This may be his anginal    equivalent and only exacerbated by his tachy arrhythmia.  He currently is    in sinus without complaints of chest pain.  He will be admitted.  Serial    cardiac enzymes will be obtained.  Patient will be monitored on telemetry.    Stress Cardiolite will be obtained for further evaluation.  Once stress    Cardiolite has been obtained, would discontinue the Cardura and initiate    Toprol for his hypertension.  This will assist in both hypertension and    rate control should the patient develop further episodes of atrial flutter.    Should the patient have significant episodes of atrial flutter he may need    antiarrhythmic as well as anticoagulation therapy.  Further recommendation    regarding coronary artery disease will be made on the basis of this stress    Cardiolite. 2. Hypertension.  Continue with Altace, hydrochlorothiazide.  Would switch    from Cardura to Toprol following the stress testing. 3. Health maintenance.  Patient currently is on Lipitor.  His cholesterol    profile is followed by Dr. Earl Gala. DD:  04/23/00 TD:  04/23/00 Job: [id number]

## 2010-09-05 NOTE — Discharge Summary (Signed)
[facility]  Patient:    [name], [name] Visit Number: [id number] MRN: [id number]          Service Type: SUR Location: 3W 0381 01 Attending Physician:  Thermon Leyland Dictated by:   Barron Alvine, M.D. Admit Date:  02/23/2001 Disc. Date: 03/03/01   CC:         Meade Maw, M.D.  Theressa Millard, M.D.   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  PROCEDURE:  Radical cystoprostatectomy with pelvic lymph node dissection and ileal neobladder on February 23, 2001.  HOSPITAL COURSE:  Mr. Fitton is a 77 year old male.  After developing gross hematuria on anticoagulation, he was diagnosed with multiple transitional cell carcinoma of his bladder.  One of these showed superficial muscle invasion and the other showed laminal propriae invasion along with carcinoma in situ.  The patient accepted a recommendation for radical cystoprostatectomy.  He elected to have an ileal neobladder.  The patient was admitted to the hospital after his procedure.  At the time of surgical exploration, there was no evidence of disease outside the bladder.  The surgery itself was relatively uncomplicated. The patient did require transfusion of three units of packed blood cells.  His final pathology revealed pathologic T2A staging.  He did have invasive transitional cell carcinoma invading the superficial muscle, but the margins were not involved and the tumor did not extend through the muscularis.  There was also an incidentally found Gleason 3+3=6 adenocarcinoma of the prostate with negative margins as well.  Lymph nodes were all negative.  The patients postoperative course was relatively unremarkable.  His initial postoperative hemoglobin was 12.1 and has continued to remain stable.  His last hemoglobin was 12.6 on discharge with a normal white blood cell count. His renal function also remained normal.  His  last BUN and creatinine were 10 and 1.3 respectively.  He had no obvious cardiac or pulmonary complications. He never had any significant fever and there was no evidence of any infection. He did have a somewhat prolonged ileus and it was four to five days before the patient could start on any p.o. intake.  He was initially managed for two days in the intensive care unit primarily for more intensive nursing and more careful monitoring.  He was then transferred to the 3 [facility] floor.  On postop day #6, the patients urethral catheter did come out accidentally, probably due to failure of the balloon.  With flexible cystoscopy and a guide wire, we were able to replace the urethral catheter without much difficulty.  His Al Pimple drain was removed on approximately postop day #4 after the fluid was confirmed not to be urine.  We removed his stents on postop day #8.  DISPOSITION:  The patient will be discharged to home on postop day #8.  He has been instructed how to do self-irrigation of his catheter to avoid any mucus occlusion.  He will have urethral catheter drainage as well as a suprapubic catheter, both within the neobladder.  The stents have been discontinued, as has the [employer] drain.  At this time, he is afebrile and tolerating a general diet well with normal bowel movements.  DISCHARGE MEDICATIONS: 1. Vicodin. 2. Regular medications.  SPECIAL INSTRUCTIONS:  He could restart anticoagulation if Dr. Fraser Din thought that it was necessary, but since he has continued to be in normal sinus rhythm, that may not be at this point.  His final pathology is very  encouraging and we do not plan the need for any additional radiation or chemotherapy at this time.  FOLLOWUP:  He will present early next week for staple removal as well as removal of his suprapubic tube.  Dictated by:   Barron Alvine, M.D.  Attending Physician:  Thermon Leyland DD:  03/03/01 TD:  03/03/01 Job:  [id number]

## 2010-09-05 NOTE — Op Note (Signed)
[facility]  Patient:    [name], [name] Visit Number: [id number] MRN: [id number]          Service Type: SUR Location: 3W 0343 01 Attending Physician:  Thermon Leyland Proc. Date: 01/26/01 Admit Date:  01/26/2001   CC:         Meade Maw, M.D.  Winn Jock. Earl Gala, M.D.   Operative Report  PREOPERATIVE DIAGNOSES: 1. Gross hematuria. 2. Bladder tumor (multiple).  POSTOPERATIVE DIAGNOSES: 1. Gross hematuria. 2. Bladder tumor (multiple).  PROCEDURE PERFORMED:  Cystoscopy, bladder biopsy, TURBT of large bladder neck bladder tumor.  SURGEON:  Barron Alvine, M.D.  ANESTHESIA:  General.  INDICATIONS:  Mr. Fan is a 77 year old male.  He has previous longstanding tobacco use history.  He is also on chronic anticoagulation for atrial fibrillation, although he recently underwent successful cardioversion.  He has had some painless gross hematuria.  Evaluation included CT scan which was unremarkable.  We performed in-office cystoscopy which revealed a relatively large papillary bladder tumor involving the right bladder neck of the bladder. He also had numerous other smaller tumors.  We discussed his situation with his cardiologist, who agreed to allow [place] to discontinue his anticoagulation for resection of these tumors.  He presents now for endoscopic evaluation and resection of the tumors.  TECHNIQUE AND FINDINGS:  The patient was brought to the operating room where he had successful induction of general anesthesia.  He was placed in lithotomy position, and prepped and draped in the usual manner.  Cystoscopy revealed a relatively unremarkable anterior urethra.  The patient had mild trilobar hyperplasia.  At the bladder neck, one could see papillary tumor, pretty much involving the entire right side of the bladder neck.  This was from approximately the 6 oclock position up to about the 11 oclock position.  The tumor extended several  centimeters into the bladder neck.  Overall, the tumor was 5-6 cm in size.  In addition, on the posterior wall of the bladder, there were multiple smaller areas of papillary transitional cell carcinoma and questionable carcinoma in situ.  There was also a small area of increased erythema on the left lateral wall of the bladder.  We began with careful cystoscopic evaluation.  I elected to cold cup biopsy the posterior wall and also the left lateral wall of the bladder.  We then put the 28 French continuous-flow resectoscope in and used the cutting loop to provide coagulation for the previous cold cup biopsy sites.  Attention was then turned to the tumor at the bladder neck.  There was no area of involvement close to the ureteral orifices.  We completely resected the bladder neck tumor down to capsular fibers of the bladder neck, and deeper bites were sent separately. The resection did extend slightly into the prostatic urethra to enable [place] to get good margins as well as to determine whether there was any invasion within the prostate parenchyma.  Coagulation was used to afford hemostasis.  At the completion of the procedure, hemostasis was quite acceptable.  A 24 French three-way Foley catheter was placed to light traction, and continuous bladder irrigation with saline will be started.  His urine was a very light pink upon returned to the recovery room.  No obvious complications occurred.  He will kept for 23-hour observation. Attending Physician:  Thermon Leyland DD:  01/26/01 TD:  01/26/01 Job: [id number]

## 2010-09-05 NOTE — Assessment & Plan Note (Signed)
[facility]                           ELECTROPHYSIOLOGY OFFICE NOTE   NAME:Chavis, JENNER ROSIER                      MRN:          [id number]  DATE:01/20/2006                            DOB:          1930/08/22    Mr. Katzenstein is seen.  He is status post attempted flutter ablation that was  demonstrated to be isthmus dependent.  We were able to have a significant  impact but not totally eliminate __________  conduction.  He is to be  submitted for repeat procedure with ESI support.   He is holding sinus rhythm currently.   PAST MEDICAL HISTORY:  1. Bypass.  2. Prostate cancer.  3. Colon cancer.  4. Aortic stenosis (of which he was unaware).   CURRENT MEDICATIONS:  1. Lipitor 40.  2. Coumadin.  3. Cartia 120.   ALLERGIES:  HE IS ALLERGIC TO ALTACE.   PHYSICAL EXAMINATION:  VITAL SIGNS:  His blood pressure is 124/82, pulse of  71.  HEENT:  Demonstrates no icterus xanthoma.  NECK:  Veins were flat.  The carotids were mildly delayed.  BACK:  Without kyphosis or scoliosis.  LUNGS:  Clear.  HEART:  Sounds were regular with a 2/6 early to mid peaking systolic murmur.  EXTREMITIES:  Without edema.   Electrocardiogram, dated today, demonstrated a sinus rhythm at 71 with  intervals of 0.19, 0.15, 0.42.  The axis is 77.  There is a right bundle  branch block and a possible inferior wall MI.   IMPRESSION:  1. Atrial flutter with a prior failed ablation.  2. Ischemic heart disease.      a.     Status post coronary artery bypass graft.      b.     Normal left ventricular function.  3. Aortic stenosis, mild.  4. Right bundle branch block.  5. Cancers as noted previously.   Mr. Everton will be undergoing RF catheter ablation under ESI support for  atrial flutter.  The risks were reviewed with the patient.            ______________________________  Duke Salvia, MD, [facility]     SCK/MedQ  DD:  01/20/2006  DT:  01/20/2006  Job #:  [id number]   cc:    Corky Crafts, MD

## 2010-09-05 NOTE — Discharge Summary (Signed)
NAMETHUAN, TIPPETT NO.:  [id number]   MEDICAL RECORD NO.:  [id number]          PATIENT TYPE:  OIB   LOCATION:  6527                         FACILITY:  [facility]   PHYSICIAN:  Duke Salvia, MD, FACCDATE OF BIRTH:  Dec 06, 1930   DATE OF ADMISSION:  01/25/2006  DATE OF DISCHARGE:  01/26/2006                                 DISCHARGE SUMMARY   The patient has allergy to ALTACE, which causes mouth swelling.   PRINCIPAL DIAGNOSES:  1. Atrial flutter.      a.     Discharging day #1 status post successful cavo-tricuspid isthmus       ablation under ESI mapping array.      b.     Atypical atrial flutter.      c.     Attempted ablation December 22, 2005, unable to achieve cavo-       tricuspid isthmus block.      d.     Failed direct current cardioversion on two separate occasions in       the past, the last October 20, 2005.      e.     Sotalol discontinued.      f.     The patient will discontinue Coumadin  2. Nonsustained ventricular tachycardia post procedure.   SECONDARY DIAGNOSES:  1. Coronary artery disease.      a.     Coronary artery bypass graft surgery 2001.      b.     Subsequent percutaneous coronary intervention in 2004.  2. Hypertension.  3. Chronic obstructive pulmonary disease.  4. Gastroesophageal reflux disease.  5. Myoview study January 2007, ejection fraction 65-70%.  6. History of cancer.      a.     Prostate cancer.      b.     Colon cancer.      c.     Bladder cancer.  7. Vitamin B12 deficiency.  8. Neobladder formation in fall 2002, status post prostate cancer surgery      2001, status post colon cancer surgery 2004.   PROCEDURE:  January 25, 2006, electrophysiology study, radiofrequency  catheter ablation of an entrained atrial flutter with successful cavo-  tricuspid isthmus ablation:  ESI mapping.   BRIEF HISTORY:  Mr. Lawerance Bach is a 77 year old male.  He has a history of  coronary artery disease.  He had coronary artery bypass graft  surgery 2001.  He has had subsequent percutaneous intervention in 2004.   He developed atrial flutter around the time of coronary artery bypass and  has had recurrence on at least two occasions, for which he underwent  cardioversion.  He presented September 4 for electrophysiology study,  radiofrequency catheter ablation.  However, the study was unsuccessful and  Dr. Graciela Husbands was unable to achieve cavo-tricuspid isthmus block.  His atrial  flutter has been recurring and he presents October 8 for a re-look  electrophysiology study with the help of electroanatomical mapping.   HOSPITAL COURSE:  The patient presented electively to [facility]  October 8.  He underwent successful radiofrequency catheter ablation of an  atypical atrial flutter.  He is discharging in sinus rhythm.  He is able to  stop his Coumadin at this time and he will discharge on the following  medicines:  1. Cartia XL 120 mg daily.  2. Lipitor 40 mg daily at bedtime.  3. Multivitamin daily.  4. B12 injections monthly.  5. Bicitra 15 mL twice daily.   He has follow-up with [facility], [address] to see  the physician assistant Thursday, February 18, 2006, at 2:45 p.m.   Laboratory studies pertinent to this admission were taken January 20, 2006:  White cells 7.3, hemoglobin 13.6, hematocrit 39.8, platelets 207.  The  protime at that time was 21.6, INR of 2.8.  Serum electrolytes:  Sodium 141,  potassium 3.9, chloride 115, carbonate 20 glucose 105, BUN is 61, creatinine  1.6, calcium 9.2.     ______________________________  Maple Mirza, PA    ______________________________  Duke Salvia, MD, [facility]    GM/MEDQ  D:  01/26/2006  T:  01/27/2006  Job:  [id number]   cc:   Corky Crafts, MD  Theressa Millard, M.D.

## 2010-09-05 NOTE — Discharge Summary (Signed)
NAME:  [name], [name]:  [id number]   MEDICAL RECORD NO.:  [id number]                   PATIENT TYPE:  INP   LOCATION:  0353                                 FACILITY:  [facility]   PHYSICIAN:  Gita Kudo, M.D.              DATE OF BIRTH:  01/28/1931   DATE OF ADMISSION:  08/08/2002  DATE OF DISCHARGE:  08/13/2002                                 DISCHARGE SUMMARY   CHIEF COMPLAINT:  Rectal sigmoid lesion.   HISTORY:  A 77 year old man admitted for elective colon resection.  He has  had bladder cancer treated by resection and neobladder.  He has had cardiac  surgery, as well as stent placement within the past year.  There is a family  history of colon cancer and colonoscopy showed a biopsy proven cancer at 25  cm and a polyp that was removed.  Also villous adenoma.   LABORATORY STUDIES:  Pathology:  Colon segment with scarring and foreign  body inflammation, no residual tumor identified.  Three mesenteric lymph  nodes without tumor identified, additional 2.5 cm of sigmoid colon without  any pathologic abnormality.   HOSPITAL COURSE:  The patient had undergone an outpatient bowel prep.  He  had exploratory laparotomy and no evidence of any other lesions, and then  proceeded to a left colectomy.  Because of difficulty passing the Foley, Dr.  Ellin Goodie group was called and a catheter was placed in the operating room  without any difficulty.  Postoperatively he did quite well.  His nasogastric  tube remained until his second postoperative day.  He had his Foley removed  on his fourth postoperative day.  He was ambulatory, tolerating and diet and  had no wound problems when he was discharged on his fifth postoperative day.  He will be followed as an outpatient.   ADDITIONAL LABORATORY STUDIES:  Hemoglobin 14, hematocrit 40, WBC 5300,  postoperative hemoglobin 12.4, hematocrit 36.  Initial CMET normal,  postoperatively showed a slightly low sodium of  134, glucose mildly elevated  at 112, BUN was 36, dropped to 3.4 postoperatively, and a creatinine of 1.9  preoperatively was 1.4 postoperatively.  CEA was normal at 1.5.  His EKG  showed a normal rhythm, right bundle branch block, inferior infarct.   DISCHARGE DIAGNOSES:  1. Carcinoma of colon.  2. Diverticulosis.   OPERATIONS:  Left colectomy.   CONSULTATIONS:  Urology -- Dr. Barron Alvine.   COMPLICATIONS:  Infections none.   CONDITION ON DISCHARGE:  Good.                                               Gita Kudo, M.D.    MRL/MEDQ  D:  08/21/2002  T:  08/21/2002  Job:  [id number]   cc:  Tasia Catchings, M.D.  [address]  [place]  [place] [address]  Fax: [phone]   Valetta Fuller, M.D.  [address]  [place]  [place] [address]  Fax: [phone]   Charlies Constable, M.D.   Theressa Millard, M.D.  [address] [place]  [place] [address]  Fax: [phone]

## 2010-09-05 NOTE — Cardiovascular Report (Signed)
[place]. [facility]  Patient:    [name], [name]                      MRN: [id number] Proc. Date: 04/26/00 Adm. Date:  [id number] Attending:  Darnelle Bos CC:         Winn Jock. Earl Gala, M.D.   Cardiac Catheterization  REFERRING PHYSICIAN:  Winn Jock. Earl Gala, M.D.  REASON FOR REFERRAL:  Atrial flutter associated with chest pain.  PROCEDURE PERFORMED:  Left heart catheterization, coronary angiography, single plane ventriculogram, left internal mammary artery.  INDICATIONS FOR PROCEDURE:  Chest pain associated with positive cardiac enzymes.  DESCRIPTION OF PROCEDURE:  After obtaining written informed consent, the patient was brought to the cardiac catheterization lab in the postabsorptive state.  Preoperative sedation was achieved with IV Versed, IV fentanyl.  The right groin was prepped and draped in the usual sterile fashion.  Local anesthesia was achieved using 1% Xylocaine.  A 6 French hemostasis sheath was placed into the right femoral artery using a modified Seldinger technique. Selective coronary angiography was performed using a JL5, JR4.  Single plane ventriculogram was performed in the RAO position using a 6 French pigtail curved catheter.  All catheter exchange were made over a guidewire.  FINDINGS:  The aortic pressure was 151/75, LV pressure 147/30.  Single plane ventriculogram revealed normal left ventricular function.  Ejection fraction 65%.  Unable to comment on the mitral regurgitation.  Fluoroscopy revealed diffuse calcification in the proximal left system and significant calcification throughout the length of the right coronary artery.  CORONARY ANGIOGRAPHY:  Left main coronary artery:  The left main coronary artery bifurcated into the left anterior descending and circumflex vessel.  T There was a 50% stenosis in the distal left main.  Left anterior descending:  The left anterior descending gave rise to a small to moderate  diagonal #1, moderate sized bifurcating diagonal #2 and multiple small diagonals.  The LAD went on to end as an apical recurrent branch.  There was a long 80-90% proximal lesion in the LAD involving the first diagonal. The lower limb of the diagonal #2 had a 90% ostial lesion noted.  Circumflex vessel:  The circumflex vessel was a large vessel that gave rise to a large OM-1.  The circumflex had a 80-90% proximal occlusion.  This was followed by 100% occlusion after the collateral branch.  There was distal filling of the circumflex noted by collateralization.  Right coronary artery:  The right coronary artery was severely calcified. There was 100% occlusion and multiple RV marginal branches noted.  The left internal mammary artery was patent.  IMPRESSION:  Critical disease involving the distal left main, severe proximal disease in the left anterior descending.  Sequential lesions in the circumflex involving a 70-80% proximal lesion followed by total occlusion of the circumflex with good collateralization.  The right coronary artery was also totally occluded with good collateralization, preserved left ventricular function.  RECOMMENDATIONS:  CV vascular or surgical consultation will be obtained. Echocardiogram will also be obtained to evaluate valvular function prior to surgery.  These findings were discussed with the patient. DD:  04/26/00 TD:  04/26/00 Job: [id number]

## 2010-09-05 NOTE — Op Note (Signed)
[place]. [facility]  Patient:    [name], [name] Visit Number: [id number] MRN: [id number]          Service Type: MED Location: [phone] Attending Physician:  Darnelle Bos Dictated by:   Llana Aliment. Randa Evens, M.D. Proc. Date: 07/01/01 Admit Date:  07/01/2001 Discharge Date: 07/03/2001   CC:         Theressa Millard, M.D.  Genene Churn. Sherin Quarry, M.D.  Candace Cruise, M.D.   Operative Report  PROCEDURE:  Esophagogastroduodenoscopy.  ENDOSCOPIST:  Llana Aliment. Randa Evens, M.D.  MEDICATIONS:  Cetacaine spray, Versed 7 mg IV.  INDICATION:  Subacute gastrointestinal bleed in a gentleman who has been on amiodarone and Coumadin.  Recent colonoscopy a couple of years ago by Dr. Sherin Quarry showed a small polyp.  The patient has had no significant upper GI symptoms.  This was done to look for a source of bleeding.  DESCRIPTION OF PROCEDURE:  The procedure had been explained to the patient and consent obtained.  The patient was placed in the left lateral decubitus position.  The Olympus videoendoscope was inserted blindly into the esophagus and advanced under direct visualization.  The stomach was entered, pylorus identified and passed.  The duodenum including the bulb and second portion was seen well and was unremarkable.  The pyloric channel and antrum and body were all normal.  The fundus and cardia seen well on the retroflexed view and were normal.  There was a small hiatal hernia right at the GE junction.  There was a small linear ulceration.  It was not actively bleeding.  The distal esophagus was normal with no gross abnormalities.  The scope was withdrawn. The patient tolerated the procedure well.  ASSESSMENT:  Small linear ulcer at the gastroesophageal junction, bleeding probably due to this ulcer exacerbated by his previous Coumadin use.  PLAN:  We will go ahead and feed the patient, given oral Protonix, check a blood count in the morning, and maybe he  could be discharged tomorrow. Dictated by:   Llana Aliment. Randa Evens, M.D. Attending Physician:  Darnelle Bos DD:  07/02/01 TD:  07/04/01 Job: [id number]

## 2010-09-05 NOTE — H&P (Signed)
[facility]  Patient:    [name], [name] Visit Number: [id number] MRN: [id number]          Service Type: MED Location: [phone] Attending Physician:  Thermon Leyland Dictated by:   Barron Alvine, M.D. Admit Date:  03/23/2001                           History and Physical  CHIEF COMPLAINT: Gross hematuria, urinary retention, acute renal failure, hyperkalemia.  HISTORY OF PRESENT ILLNESS: The patient is a 77 year old male. He has a complex urologic history. He is approximately 4-5 weeks status post a radical cystoprostatectomy with ileoneobladder. The patients postoperative course, up until this point, has been uneventful. His pathology revealed muscle invasive bladder cancer without evidence of metastatic disease. The patient had a cystogram performed approximately three weeks postoperatively which showed no evidence of extravasation. Approximately two days ago, the patients catheter was removed in the office. The patient was able to void at that time and was sent home. The patient contacted our office after closing today. He reported that he was having very little urine output for the day and was having a fair amount of blood per urethra. The weather in [place] was changing dramatically with significant icing, snow, but we felt that the patient did need to be evaluated and he was referred to the emergency room for evaluation. We contacted the physician on call, Dr. Larey Dresser and told him about this patients situation. The patient presented to the emergency room where an attempt was made to place a catheter. They were uncertain whether it was in the neobladder. Dr. Vonita [name] came in to assess the situation. He attempted to irrigate the catheter, was not convinced whether the Foley was actually within the neobladder or not. A cystogram was performed and they thought they saw contrast extravasating. They were not confident that the catheter  was indeed in the bladder. Dr. Vonita [name] made another attempt to place a catheter and was also uncertain whether the catheter was in position.  A CT scan was performed which showed some bilateral hydronephrosis. There appeared to be at least a partially distended neobladder with evidence of probably clot within the bladder, although it was difficult to say was certainty. There was also evidence of a small amount of contrast potentially within the neobladder but also a question of some contrast outside the neobladder.  At that point, I contacted the emergency room to find out what is going on and decided to come in to evaluate the patient myself.  In the interim, the patients labs came back. He is on Coumadin and is anticoagulated with an INR of 2.4. His hemoglobin was 11.8.  His creatinine was elevated at 2.4 and his potassium was markedly elevated at 6.8.  I performed flexible cystoscopy in the emergency room. I was able to enter what I thought was neobladder based on appearance of what appeared to be some ileo-mucosa. There also appeared to be numerous clots within the neobladder and at least one really large clot filling up substantial portion of the lumen. We were able to place the guide wire through the cystoscope and then a Council tip catheter was placed into what we felt was the neobladder. With approximately 2 L of irritation, we were able to obtain a large amount of clots and mucous. Then the catheter irrigated very nicely, and based on that as well as the return of several hundred ccs of  urine, I felt confident that we were indeed within the neobladder. The urine continued to be bloody. His abdomen, which was distended at the time of his admission, was markedly improved per a notation from Dr. Vonita [name], who also accompanied me during this portion of the evaluation.  PAST MEDICAL HISTORY: Significant for coronary artery disease. He has had bypass surgery within the past 12  months, and has had a history of atrial fibrillation, although more recently he has been in sinus rhythm. He was restarted recently on his anticoagulation.  CURRENT MEDICATIONS: Include, Toprol, amiodarone, Lipitor, and recently Levaquin. He also takes Ambien.  ALLERGIES: He has no drug allergies.  HABITS: He does have a remote history of tobacco use.  PHYSICAL EXAMINATION:  GENERAL: On examination he was a well-developed, well-nourished male.  VITAL SIGNS: He is afebrile.  He has a sinus rhythm of 87 with a blood pressure of 157/82.  ABDOMEN: Showed a well-healed midline incision and no obvious distention after the catheter had been placed.  GENITALIA: External genitalia unremarkable.  EXTREMITIES: Unremarkable.  ASSESSMENT: Gross hematuria with obtained urinary tract obstruction. I believe what has transpired is that the patient developed some hematuria with reinitiation of his anticoagulation and probable clot obstruction of his neobladder. With that, I believe he had bilateral hydronephrosis with renal failure and elevated potassium. His catheter now appears to be in the neobladder based on my visual inspection at the time of cystoscopy as well as the fact that it irrigates quite nicely at this time. We will need to reverse his anticoagulation with subcutaneous vitamin K. The emergency room physician, Dr. Margretta Ditty, has agreed to manage his hyperkalemia and will be giving the patient bicarbonate as well as chloride and Kayexalate.  We will hydrate the patient vigorously and give him some Lasix to try to clear the urine. I believe his renal failure again is obstructive and should improve and that should help his potassium. He will have an ECG as well. The patient will be admitted with careful monitoring. We will obtain repeat blood studies in the morning and a cystogram at some point to confirm that he is indeed getting good drainage. If his creatinine remains elevated, the  patient will need bilateral percutaneous nephroscopy tubes. Dictated by:   Barron Alvine, M.D. Attending Physician:  Thermon Leyland  DD:  03/23/01 TD:  03/24/01 Job: [id number]

## 2010-09-05 NOTE — Cardiovascular Report (Signed)
[place]. [facility]  Patient:    NOLYN, SWAB                      MRN: [id number] Proc. Date: 04/30/00 Adm. Date:  [id number] Attending:  Mikey Bussing CC:         Mikey Bussing, M.D.  Winn Jock. Earl Gala, M.D.   Cardiac Catheterization  PROCEDURE:  Electrical cardioversion.  INDICATIONS:  Persistent atrial flutter.  The procedure, risks, benefits, and options were explained to the patient. The patient wished to proceed with electrical cardioversion.  Anesthesia support was obtained.  Anesthesia was provided by Dr. Katrinka Blazing using 150 mg of sodium Pentothal IV.  DESCRIPTION OF PROCEDURE:  Following appropriate sedation, the patient was electrically cardioverted with 200 joules synchronized.  He was successfully cardioverted to sinus rhythm, but only maintained sinus rhythm for approximately 30 seconds.  He was again cardioverted and again converted to sinus rhythm, but returned to atrial fib/flutter with a variable block within 15 to 20 seconds.  It was felt that the patient would be better served by continuing with amiodarone load for one week and then reattempts to electrically cardiovert.  RECOMMENDATIONS:  Continued with amiodarone load at 400 mg p.o. t.i.d. x 1 week.  Repeat electrical cardioversion in one week if the patient is maintaining atrial flutter.  Will continue with anticoagulation for a six-week interim if the patient should successful convert to sinus rhythm on his own. INR goal is 2 to 3. DD:  04/30/00 TD:  04/30/00 Job: [id number]

## 2010-09-05 NOTE — Op Note (Signed)
[place]. [facility]  Patient:    [name], [name]                      MRN: [id number] Proc. Date: 04/27/00 Adm. Date:  [id number] Attending:  Mikey Bussing CC:         Meade Maw, M.D., [facility]  Winn Jock. Earl Gala, M.D., [facility]   Operative Report  OPERATION:  Coronary artery bypass grafting x 5 (left internal mammary artery to the left anterior descending, saphenous vein graft to diagonal, sequential saphenous vein graft to obtuse marginal #1 and obtuse marginal #2, saphenous vein graft to posterior descending).  PREOPERATIVE DIAGNOSIS:  Class IV unstable angina with severe three-vessel coronary artery disease.  POSTOPERATIVE DIAGNOSIS:  Class IV unstable angina with severe three-vessel coronary artery disease.  SURGEON:  Mikey Bussing, M.D.  ASSISTANTS Myrlene Broker, P.A. Maxwell Marion, RNFA  ANESTHESIA:  General.  INDICATION:  The patient is a 77 year old male with a history of hypertension and hyperlipidemia who presented with symptoms of unstable angina and atrial flutter.  He was admitted to the hospital and ruled out for MI and underwent cardiac catheterization by Dr. Meade Maw, which demonstrated severe three-vessel coronary disease with 50-70% left main stenosis.  He was referred for surgical coronary revascularization.  Prior to the operation, I examined the patient in his hospital room and discussed the results of the cardiac catheterization with the patient and his wife.  I reviewed the indications and expected benefits of coronary bypass surgery.  I reviewed the major aspects of the operation with the patient and family, including the location of the surgical incisions, the choice of conduit, the use of general anesthesia and cardiopulmonary bypass and the expected hospital recovery period.  I discussed the alternatives to surgical therapy for management of his coronary artery disease.  I  reviewed the risks associated with this operation including the risks of MI, CVA, bleeding, infection and death.  He understood the implications for surgery and agreed to proceed with the operation as planned under informed consent.  DESCRIPTION OF PROCEDURE:  The patient was brought to the operating room and placed supine on the operating room table where general anesthesia was induced under invasive hemodynamic monitoring.  The chest, abdomen and legs were prepped with Betadine and draped as a sterile field.  A median sternotomy was performed as the saphenous vein was harvested from the right leg.  The internal mammary artery was harvested as a pedicle graft from its origin at the subclavian vessels and was a good vessel with excellent flow.  Heparin was administered and the ACT was documented as being therapeutic.  Through pursestrings placed in the ascending aorta and right atrium, the patient was cannulated and placed on bypass and cooled to 32 degrees.  The coronaries were identified and the mammary artery and vein grafts were prepared for the distal anastomoses.  A cardioplegia cannula was placed and the patient was cooled to 28 degrees.  As the aortic cross-clamp was applied, 500 cc of cold blood cardioplegia were delivered to the aortic root, with immediate cardioplegic arrest and septal temperature dropping less than 12 degrees.  Topical iced saline slush was used to augment myocardial preservation and a pericardial insulator pad was used to protect the left phrenic nerve.  The distal coronary anastomoses were then performed.  The first distal anastomosis was to the posterior descending; this was a 1.5-mm vessel with total proximal occlusion and  a reverse saphenous vein was sewn end-to-side with running 7-0 Prolene.  The second distal anastomosis was to the first diagonal, which is a 1.5-mm vessel with proximal 95% stenosis.  A reverse saphenous vein was sewn end-to-side with  running 7-0 Prolene, with good flow through the graft.  The third and fourth distal anastomoses consisted of a sequential vein graft to the OM-1 and OM-2.  The OM-1 was a large 1.8-mm vessel which was intramyocardial down to the distal aspect of the anterolateral wall.  At this point, a side-to-side anastomosis with the vein was constructed using a running 7-0 Prolene and there was good flow through the graft.  The continuation of this sequential vein graft was the fourth distal anastomosis to the small OM-2; this was a 1.4-mm vessel which was totally occluded proximally and the end of the vein was sewn end-to-side with running 7-0 Prolene.  Cardioplegia was redosed.  The fifth distal anastomosis was to the distal third of the LAD, where it became epicardial in location from its intramyocardial more proximal location.  The mammary pedicle was brought through an opening created in the left lateral pericardium and was brought down on the LAD and sewn end-to-side with a running 8-0 Prolene. There was excellent flow through the anastomosis with immediate rise in septal temperature after release of the pedicle clamp on the mammary artery.  The mammary pedicle was secured to the epicardium and the aortic cross-clamp was removed.  The heart resumed a spontaneous rhythm.  A partial-occluding clamp was placed on the ascending aorta and three proximal vein anastomoses were performed using a 4.0-mm punch and running 6-0 Prolene.  The partial clamp was removed and the vein grafts were perfused; each had excellent flow and hemostasis was documented at the proximal and distal anastomoses.  The patient was rewarmed to 37 degrees and temporary pacing wires were applied.  The lungs were re-expanded, the ventilator was resumed and the patient was weaned from cardiopulmonary bypass without inotropes and stable blood pressure and cardiac output.  He required 80 sequential pacing for third-degree heart block.   He remained stable.  Protamine was administered and the cannulae were removed.  The mediastinum was irrigated with warm antibiotic irrigation.  The leg incision was irrigated and closed in a standard fashion.  Patient had evidence of diffuse coagulopathy and had received Plavix preoperatively and was given a dose of platelets and FFP; this improved the coagulopathic findings in the operative field.  The pericardium was loosely reapproximated superiorly over the aortic vein grafts.  Two mediastinal and a left pleural chest tube were placed and brought out through separate incisions.  The sternum was reapproximated with interrupted steel wire and the pectoralis fascia and subcutaneous layers closed with running Vicryl.  The skin was closed with a subcuticular and sterile dressings were applied.  Total cardiopulmonary bypass time was 135 minutes, with aortic cross-clamp time of 65 minutes. DD:  04/27/00 TD:  04/28/00 Job: [id number]

## 2010-09-05 NOTE — Op Note (Signed)
[facility]  Patient:    [name], [name] Visit Number: [id number] MRN: [id number]          Service Type: MED Location: 3W [id number] Attending Physician:  Thermon Leyland Dictated by:   Barron Alvine, M.D. Proc. Date: 03/25/01 Admit Date:  03/23/2001 Discharge Date: 03/26/2001                             Operative Report  PREOPERATIVE DIAGNOSES: 1. Gross hematuria. 2. Acute renal failure. 3. Bilateral hydronephrosis. 4. Hyperkalemia. 5. Questionable misplaced urethral catheter, status post cystectomy and    neobladder.  POSTOPERATIVE DIAGNOSES: 1. Gross hematuria. 2. Acute renal failure. 3. Bilateral hydronephrosis. 4. Hyperkalemia. 5. Questionable misplaced urethral catheter, status post cystectomy and    neobladder.  PROCEDURE PERFORMED:  Cystogram, cystoscopy and evacuation of clot.  SURGEON:  Barron Alvine, M.D.  ANESTHESIA:  General.  INDICATIONS:  [name] is a 77 year old male.  He has a complicated urologic history.  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  The patients postoperative course initially was totally unremarkable, and he did well.  His cystogram at three weeks showed no evidence of extravasation, and his catheter was subsequently removed.  He had dribbling incontinence for the first 24 hours and then urinary retention.  He failed to call us for approximately 8-12 hours and then began having significant hematuria with passage of clots.  He was directed to the emergency room.  An attempt was made to place a catheter, but it was unclear it was in proper position.  Dr. Vonita Moss also evaluated him, from our practice, and we subsequently came in to see the patient as well.  With the cystogram, it was difficult to determine whether the catheter was in place.  We thought that we had a Foley  in because of reasonable irrigation, but the patient has continued to have some hematuria and has also had intermittently leakage around the penis as well as occasional difficulty with irrigation.  We were concerned that the patient had a number of clots within his neobladder or possibly a catheter that was not fully draining the neobladder.  We felt the prudent course of action was to perform a cystogram under anesthesia with good visual cystoscopy to try to determine what was going on.  TECHNIQUE AND FINDINGS:  The patient was brought to the operating room where he had successful induction of general anesthesia.  He was placed in lithotomy position and prepped and draped in the usual manner.  A cystogram was performed initially with the catheter that was indwelling.  This catheter took a circuitous route and seemed to take a sharp left angle at the bladder neck and then head back over to the right side.  With contrast, there seemed to be a cavity which suggested the possibility that it was indeed filling the neobladder, but it did not drain that well, and it was a question of whether this was actually misplaced or not.  For that reason, the catheter was removed, and cystoscopy was performed.  The patient had an unremarkable urethra.  We did not see much coaptation at his sphincter, and he appeared to have a wide open bladder neck.  It was fairly obvious at that point that there was a partial posterior disruption with a false passage and cavity underneath the neobladder.  By directing  the scope anteriorly, we were able to enter the neobladder.  The neobladder itself had numerous clots within the lumen.  These were evacuated with irrigation.  We then performed repeat cystoscopy of the neobladder.  The capacity seemed to be excellent.  The mucosa had a typical appearance without active bleeding.  I then placed a guidewire through the cystoscope into the neobladder.  Over that guidewire, I  was able to place a 24 [place] Ainsworth catheter.  A repeat cystogram was then performed, and this showed a normal-appearing neobladder with a midline catheter just as it had when we previously had performed the cystogram at the three week postoperative period.  We saw some reflux of contrast up the right ureter but did not see any contrast obviously in the left ureter.  These are the similar findings as to his previous cystogram.  My impression is that the patient had had gross hematuria with clot retention of his neobladder.  With overdistension, there may have been some posterior disruption of the anastomosis which potentially could have worsened due to the multiple catheter attempts.  He appears to have a false passage and cavity underneath his neobladder.  The catheter that we do have in place now is within his neobladder and should provide good drainage of the neobladder.  It is difficult to determine what is going to be necessary. I will leave this catheter indwelling until we are certain renal function is normal and the urine is clear.  We will then propose another voiding trial to see how he does but if has ongoing difficulty, it is conceivable he would need a repeat attempt at anastomosis. Dictated by:   Barron Alvine, M.D. Attending Physician:  Thermon Leyland DD:  03/25/01 TD:  03/27/01 Job: [id number]

## 2010-09-05 NOTE — Procedures (Signed)
[place]  Patient:    [name], [name] Visit Number: [id number] MRN: [id number]          Service Type: CAT Location: [facility] 2861 01 Attending Physician:  Mora Appl Dictated by:   Meade Maw, M.D. Admit Date:  01/17/2001   CC:         Theressa Millard, M.D.   Procedure Report  PRIMARY CARE PHYSICIAN:  Theressa Millard, M.D.  CARDIOLOGIST:  Meade Maw, M.D.  PROCEDURE PERFORMED:  Electrical cardioversion.  INDICATION FOR PROCEDURE:  Persistent atrial fibrillation.  Following an amiodarone loading dose, he has remained in atrial fibrillation.  DESCRIPTION OF PROCEDURE:  With anesthesia support and following 400 mg of sodium pentothal per Dr. Judie Petit, the patient was synchronized cardioverted to sinus rhythm with 360 joules x 1.  IMPRESSION:  Successful cardioversion of atrial fibrillation to sinus rhythm.  RECOMMENDATION:  Recommendation will be to continue amiodarone at 100 mg daily as well as Coumadin to maintain an INR of 2 to 3. Dictated by:   Meade Maw, M.D. Attending Physician:  Mora Appl DD:  01/17/01 TD:  01/17/01 Job: [id number]

## 2010-09-05 NOTE — Consult Note (Signed)
[place]  Patient:    [name], [name] Visit Number: [id number] MRN: [id number]          Service Type: MED Location: [phone] Attending Physician:  Darnelle Bos Dictated by:   Llana Aliment. Randa Evens, M.D. Proc. Date: 07/01/01 Admit Date:  07/01/2001 Discharge Date: 07/03/2001   CC:         Theressa Millard, M.D.  Genene Churn. Sherin Quarry, M.D.  Barron Alvine, M.D.   Consultation Report  DATE OF BIRTH:  01-Sep-1930  REASON FOR CONSULTATION:  Acute GI bleeding.  HISTORY OF PRESENT ILLNESS:  The patient is a 77 year old gentleman who has a fairly long course. He has had chronic atrial fibrillation, has been on amiodarone and Coumadin. He was feeling bad and short of breath, amiodarone was stopped this past Wednesday by Dr. Fraser Din. He had been on Coumadin which he stopped himself earlier in the week on Monday. He had a drop in hemoglobin, the hemoglobin previously was 11, it was checked and it was 8.2. It was confirmed today at 7.7. He saw Dr. Earl Gala in the office and his stools were heme-positive. The patient feels occasional water brash but has no heartburn, indigestion, and is on no medication for reflux, has never had ulcers, reflux or any other particular problems. It is notable that his BUN was 90 and creatinine 2.2. Due to his subacute GI bleeding, he was admitted for transfusion and further therapy.  CURRENT MEDICATIONS: 1. Lipitor 20 mg daily. 2. Altace 10 mg daily. 3. Toprol 25 mg daily. 4. Shohl solution 2 teaspoons b.i.d. 5. Iron supplements.  ALLERGIES:  He has no drug allergies.  PAST MEDICAL HISTORY: 1. History of invasive transitional cell carcinoma of the bladder status post    cystectomy and neobladder created from ileum, with a recent hospitalization    for obstruction requiring Foley catheter insertion December 2002 due to    urinary retention. 2. Coronary artery disease status post CABG with a chronic atrial    fibrillation. He has been on Coumadin and amiodarone. 3. Previous surgeries include back surgery and a heart surgery. No other    previous surgery.  FAMILY HISTORY:  Mother died of colon cancer. Father died of heart disease, had diabetes.  REVIEW OF SYSTEMS:  It is notable the patient has screening colonoscopies, has a history of colon polyps with a colonoscopy every 3-4 years by Dr. Sherin Quarry, and the last one being approximately 3 years ago with the removal of an adenomatous polyp. He has had no change in his bowel habits, no bright red blood with wiping. Had no indigestion or heartburn but does have some water brash with a feeling of regurgitation. He has never had ulcers. The GI review of systems is negative.  PHYSICAL EXAMINATION:  VITAL SIGNS:  Nonremarkable.  GENERAL:  A very pleasant white male in no acute distress.  EYES: Sclerae nonicteric.  NECK:  Supple. No lymphadenopathy.  LUNGS:  Clear.  ABDOMEN:  Soft and completely nontender.  ASSESSMENT:  Subacute gastrointestinal bleed with elevated BUN and the most likely explanation is that this is from upper gastrointestinal source.  PLAN:  I am going to go ahead and give the patient a Protonix and will plan on an endoscopy in the morning. I have discussed this in detail with he and his family and they are agreeable. Dictated by:   Llana Aliment. Randa Evens, M.D. Attending Physician:  Darnelle Bos DD:  07/01/01 TD:  07/03/01 Job: [phone]  [id number]

## 2010-09-05 NOTE — Assessment & Plan Note (Signed)
[facility]                         ELECTROPHYSIOLOGY OFFICE NOTE   NAME:BYRNEBeverly [name]                        MRN:          [id number]  DATE:03/04/2006                            DOB:          1930-11-27    ELECTROPHYSIOLOGY OFFICE NOTE   PRIMARY CARE GIVER:  Dr. Theressa [name].   CARDIOLOGIST:  Dr. Eldridge [name].   ALLERGIES:  ACE INHIBITORS.   This is a 77 year old male who had recalcitrant atrial flutter since his  coronary artery bypass graft surgery in 2001.  He is not aware of any  symptoms whatsoever.  Apparently, at 1 time, he did have marked exercise  intolerance.  He does not exhibit palpitations, chest pain, dyspnea on  exertion or at rest.  However, it apparently in the past has manifested  on electrocardiograms.  These discoveries have initiated cardioversion  at least x2, the last 1 or 2 on sotalol.  He has always reverted to  atrial flutter.  He had electrophysiology study radiofrequency catheter  ablation with a recurrence, and then an electrophysiology study with ESI  mapping with ablation of a cavotricuspid isthmus on January 25, 2006.  He  had nonsustained ventricular tachycardia post procedure.  He had been  scheduled to followup earlier this month, but apparently was rescheduled  today.  He comes in aware that his heart rate is fast and irregular.  He  can determine this when he takes his pulse.  Once again, he has no  symptoms of palpitation, dyspnea, chest pain, orthopnea, paroxysmal  nocturnal dyspnea, dizziness, or syncope.  A 12-lead electrocardiogram  was taken in the office, and this showed recurrence of his atrial  flutter.  This patient was fairly discouraged about these multiple  recurrences.   The patient's medication for rate control is Cartia XT 120 mg.  The  patient has been seen by Dr. Sherryl [name], who analyzed the EKG,  reviewed the past history with the patient, and suggested possibly that  he could be referred  to another center for possible cryoablation of his  recurrent atrial flutter.   PAST MEDICAL HISTORY:  1. Coronary artery disease status post coronary artery bypass graft      surgery in 2001 with PCI in 2004.  2. History of atrial flutter since coronary artery bypass graft      surgery with DCCV x2.  3. Exercise Myoview study showing ejection fraction of 65% to 70%.  4. Hypertension.  5. Dyslipidemia.  6. Chronic obstructive pulmonary disease.  7. Gastroesophageal reflux disease.  8. History of prostate cancer with resection in 2001.  9. History of colon cancer with resection in 2004.  10.History of bladder cancer with creation of a neobladder formation      in 2002.   The patient will be restarted on anticoagulation and this will be done  through our office here at [facility].  Continue his medications  as before.  Once again, Dr. Graciela [name] will be actively referring him for  possible future cryoablation.     Parker Mirza, PA  Electronically Signed    GM/MedQ  DD: 03/04/2006  DT: 03/04/2006  Job #: [id number]

## 2010-10-14 ENCOUNTER — Other Ambulatory Visit: Payer: Self-pay | Admitting: Dermatology

## 2010-11-03 ENCOUNTER — Encounter (HOSPITAL_COMMUNITY)
Admission: RE | Admit: 2010-11-03 | Discharge: 2010-11-03 | Disposition: A | Discharge status: Home / Self Care | Payer: Medicare Other | Source: Ambulatory Visit | Attending: Interventional Cardiology | Admitting: Interventional Cardiology

## 2010-11-03 DIAGNOSIS — Z951 Presence of aortocoronary bypass graft: Secondary | ICD-10-CM | POA: Insufficient documentation

## 2010-11-03 DIAGNOSIS — I4891 Unspecified atrial fibrillation: Secondary | ICD-10-CM | POA: Insufficient documentation

## 2010-11-03 DIAGNOSIS — I10 Essential (primary) hypertension: Secondary | ICD-10-CM | POA: Insufficient documentation

## 2010-11-03 DIAGNOSIS — Z5189 Encounter for other specified aftercare: Secondary | ICD-10-CM | POA: Insufficient documentation

## 2010-11-03 DIAGNOSIS — E785 Hyperlipidemia, unspecified: Secondary | ICD-10-CM | POA: Insufficient documentation

## 2010-11-03 DIAGNOSIS — I4892 Unspecified atrial flutter: Secondary | ICD-10-CM | POA: Insufficient documentation

## 2010-11-03 DIAGNOSIS — I2510 Atherosclerotic heart disease of native coronary artery without angina pectoris: Secondary | ICD-10-CM | POA: Insufficient documentation

## 2010-11-05 ENCOUNTER — Encounter (HOSPITAL_COMMUNITY): Payer: Medicare Other | Attending: Interventional Cardiology

## 2010-11-05 DIAGNOSIS — I4892 Unspecified atrial flutter: Secondary | ICD-10-CM | POA: Insufficient documentation

## 2010-11-05 DIAGNOSIS — Z5189 Encounter for other specified aftercare: Secondary | ICD-10-CM | POA: Insufficient documentation

## 2010-11-05 DIAGNOSIS — E785 Hyperlipidemia, unspecified: Secondary | ICD-10-CM | POA: Insufficient documentation

## 2010-11-05 DIAGNOSIS — I4891 Unspecified atrial fibrillation: Secondary | ICD-10-CM | POA: Insufficient documentation

## 2010-11-05 DIAGNOSIS — Z951 Presence of aortocoronary bypass graft: Secondary | ICD-10-CM | POA: Insufficient documentation

## 2010-11-05 DIAGNOSIS — I10 Essential (primary) hypertension: Secondary | ICD-10-CM | POA: Insufficient documentation

## 2010-11-05 DIAGNOSIS — I2510 Atherosclerotic heart disease of native coronary artery without angina pectoris: Secondary | ICD-10-CM | POA: Insufficient documentation

## 2010-11-07 ENCOUNTER — Encounter (HOSPITAL_COMMUNITY): Payer: Medicare Other

## 2010-11-10 ENCOUNTER — Encounter (HOSPITAL_COMMUNITY): Payer: Medicare Other

## 2010-11-12 ENCOUNTER — Encounter (HOSPITAL_COMMUNITY): Payer: Medicare Other

## 2010-11-14 ENCOUNTER — Encounter (HOSPITAL_COMMUNITY): Payer: Medicare Other

## 2010-11-17 ENCOUNTER — Encounter (HOSPITAL_COMMUNITY): Payer: Medicare Other

## 2010-11-19 ENCOUNTER — Encounter (HOSPITAL_COMMUNITY): Payer: Medicare Other | Attending: Interventional Cardiology

## 2010-11-19 DIAGNOSIS — I4891 Unspecified atrial fibrillation: Secondary | ICD-10-CM | POA: Insufficient documentation

## 2010-11-19 DIAGNOSIS — E785 Hyperlipidemia, unspecified: Secondary | ICD-10-CM | POA: Insufficient documentation

## 2010-11-19 DIAGNOSIS — I10 Essential (primary) hypertension: Secondary | ICD-10-CM | POA: Insufficient documentation

## 2010-11-19 DIAGNOSIS — I4892 Unspecified atrial flutter: Secondary | ICD-10-CM | POA: Insufficient documentation

## 2010-11-19 DIAGNOSIS — Z5189 Encounter for other specified aftercare: Secondary | ICD-10-CM | POA: Insufficient documentation

## 2010-11-19 DIAGNOSIS — I2510 Atherosclerotic heart disease of native coronary artery without angina pectoris: Secondary | ICD-10-CM | POA: Insufficient documentation

## 2010-11-19 DIAGNOSIS — Z951 Presence of aortocoronary bypass graft: Secondary | ICD-10-CM | POA: Insufficient documentation

## 2010-11-21 ENCOUNTER — Encounter (HOSPITAL_COMMUNITY): Payer: Medicare Other

## 2010-11-24 ENCOUNTER — Encounter (HOSPITAL_COMMUNITY): Payer: Medicare Other

## 2010-11-26 ENCOUNTER — Encounter (HOSPITAL_COMMUNITY): Payer: Medicare Other

## 2010-11-28 ENCOUNTER — Encounter (HOSPITAL_COMMUNITY): Payer: Medicare Other

## 2010-12-01 ENCOUNTER — Encounter (HOSPITAL_COMMUNITY): Payer: Medicare Other

## 2010-12-03 ENCOUNTER — Encounter (HOSPITAL_COMMUNITY): Payer: Medicare Other

## 2010-12-05 ENCOUNTER — Encounter (HOSPITAL_COMMUNITY): Payer: Medicare Other

## 2010-12-08 ENCOUNTER — Encounter (HOSPITAL_COMMUNITY): Payer: Medicare Other

## 2010-12-10 ENCOUNTER — Encounter (HOSPITAL_COMMUNITY): Payer: Medicare Other

## 2010-12-12 ENCOUNTER — Encounter (HOSPITAL_COMMUNITY): Payer: Medicare Other

## 2010-12-15 ENCOUNTER — Encounter (HOSPITAL_COMMUNITY): Payer: Medicare Other

## 2010-12-17 ENCOUNTER — Encounter (HOSPITAL_COMMUNITY): Payer: Medicare Other

## 2010-12-19 ENCOUNTER — Encounter (HOSPITAL_COMMUNITY): Payer: Medicare Other

## 2010-12-22 ENCOUNTER — Encounter (HOSPITAL_COMMUNITY): Payer: Medicare Other

## 2010-12-24 ENCOUNTER — Encounter (HOSPITAL_COMMUNITY): Payer: Medicare Other | Attending: Interventional Cardiology

## 2010-12-24 DIAGNOSIS — Z5189 Encounter for other specified aftercare: Secondary | ICD-10-CM | POA: Insufficient documentation

## 2010-12-24 DIAGNOSIS — Z951 Presence of aortocoronary bypass graft: Secondary | ICD-10-CM | POA: Insufficient documentation

## 2010-12-24 DIAGNOSIS — I4891 Unspecified atrial fibrillation: Secondary | ICD-10-CM | POA: Insufficient documentation

## 2010-12-24 DIAGNOSIS — I2510 Atherosclerotic heart disease of native coronary artery without angina pectoris: Secondary | ICD-10-CM | POA: Insufficient documentation

## 2010-12-24 DIAGNOSIS — I4892 Unspecified atrial flutter: Secondary | ICD-10-CM | POA: Insufficient documentation

## 2010-12-24 DIAGNOSIS — I10 Essential (primary) hypertension: Secondary | ICD-10-CM | POA: Insufficient documentation

## 2010-12-24 DIAGNOSIS — E785 Hyperlipidemia, unspecified: Secondary | ICD-10-CM | POA: Insufficient documentation

## 2010-12-26 ENCOUNTER — Encounter (HOSPITAL_COMMUNITY): Payer: Medicare Other

## 2010-12-29 ENCOUNTER — Encounter (HOSPITAL_COMMUNITY): Payer: Medicare Other

## 2010-12-31 ENCOUNTER — Encounter (HOSPITAL_COMMUNITY): Payer: Medicare Other

## 2011-01-02 ENCOUNTER — Encounter (HOSPITAL_COMMUNITY): Payer: Medicare Other

## 2011-01-05 ENCOUNTER — Encounter (HOSPITAL_COMMUNITY): Payer: Medicare Other

## 2011-01-07 ENCOUNTER — Encounter (HOSPITAL_COMMUNITY): Payer: Medicare Other

## 2011-01-09 ENCOUNTER — Encounter (HOSPITAL_COMMUNITY): Payer: Medicare Other

## 2011-01-12 ENCOUNTER — Encounter (HOSPITAL_COMMUNITY): Payer: Medicare Other

## 2011-01-14 ENCOUNTER — Encounter (HOSPITAL_COMMUNITY): Payer: Medicare Other

## 2011-01-16 ENCOUNTER — Encounter (HOSPITAL_COMMUNITY): Payer: Medicare Other

## 2011-01-19 ENCOUNTER — Encounter (HOSPITAL_COMMUNITY): Payer: Medicare Other | Attending: Interventional Cardiology

## 2011-01-19 DIAGNOSIS — I2510 Atherosclerotic heart disease of native coronary artery without angina pectoris: Secondary | ICD-10-CM | POA: Insufficient documentation

## 2011-01-19 DIAGNOSIS — Z951 Presence of aortocoronary bypass graft: Secondary | ICD-10-CM | POA: Insufficient documentation

## 2011-01-19 DIAGNOSIS — I4892 Unspecified atrial flutter: Secondary | ICD-10-CM | POA: Insufficient documentation

## 2011-01-19 DIAGNOSIS — I4891 Unspecified atrial fibrillation: Secondary | ICD-10-CM | POA: Insufficient documentation

## 2011-01-19 DIAGNOSIS — I10 Essential (primary) hypertension: Secondary | ICD-10-CM | POA: Insufficient documentation

## 2011-01-19 DIAGNOSIS — Z5189 Encounter for other specified aftercare: Secondary | ICD-10-CM | POA: Insufficient documentation

## 2011-01-19 DIAGNOSIS — E785 Hyperlipidemia, unspecified: Secondary | ICD-10-CM | POA: Insufficient documentation

## 2011-01-21 ENCOUNTER — Encounter (HOSPITAL_COMMUNITY): Payer: Medicare Other

## 2011-01-23 ENCOUNTER — Encounter (HOSPITAL_COMMUNITY): Payer: Medicare Other

## 2011-01-26 ENCOUNTER — Encounter (HOSPITAL_COMMUNITY): Payer: Medicare Other

## 2011-01-28 ENCOUNTER — Encounter (HOSPITAL_COMMUNITY): Payer: Medicare Other

## 2011-01-28 LAB — BASIC METABOLIC PANEL
CO2: 27
CO2: 28
CO2: 29
CO2: 33 — ABNORMAL HIGH
Calcium: 8.6
Calcium: 8.8
Calcium: 9.7
Chloride: 103
Chloride: 105
Creatinine, Ser: 1.55 — ABNORMAL HIGH
GFR calc Af Amer: 42 — ABNORMAL LOW
GFR calc Af Amer: 46 — ABNORMAL LOW
GFR calc Af Amer: >60
Glucose, Bld: 130 — ABNORMAL HIGH
Glucose, Bld: 159 — ABNORMAL HIGH
Potassium: 3 — ABNORMAL LOW
Potassium: 4.5
Sodium: 139
Sodium: 143
Sodium: 144

## 2011-01-28 LAB — CBC
HCT: 32.9 — ABNORMAL LOW
HCT: 38.7 — ABNORMAL LOW
Hemoglobin: 11.3 — ABNORMAL LOW
Hemoglobin: 13
Hemoglobin: 13.4
MCHC: 33.5
MCHC: 34
MCHC: 34.3
MCV: 95.5
RBC: 3.41 — ABNORMAL LOW
RBC: 4.02 — ABNORMAL LOW
RBC: 4.13 — ABNORMAL LOW
RDW: 16.1 — ABNORMAL HIGH

## 2011-01-28 LAB — CARDIAC PANEL(CRET KIN+CKTOT+MB+TROPI): Troponin I: 0.02

## 2011-01-28 LAB — DIFFERENTIAL
Basophils Relative: 1
Lymphocytes Relative: 24
Monocytes Absolute: 0.9 — ABNORMAL HIGH
Monocytes Relative: 9
Neutro Abs: 6.2

## 2011-01-28 LAB — PROTIME-INR
INR: 1.1
INR: 1.2
INR: 2.5 — ABNORMAL HIGH
Prothrombin Time: 14.8
Prothrombin Time: 15.5 — ABNORMAL HIGH
Prothrombin Time: 23.3 — ABNORMAL HIGH
Prothrombin Time: 27.8 — ABNORMAL HIGH

## 2011-01-28 LAB — CROSSMATCH

## 2011-01-29 LAB — ABO/RH: ABO/RH(D): O POS

## 2011-01-29 LAB — CBC
HCT: 34.5 — ABNORMAL LOW
MCHC: 33.9
MCV: 94
Platelets: 391
RDW: 14.9 — ABNORMAL HIGH

## 2011-01-29 LAB — CROSSMATCH

## 2011-01-29 LAB — PROTIME-INR
INR: 1.3
Prothrombin Time: 16.3 — ABNORMAL HIGH

## 2011-01-29 LAB — DIFFERENTIAL
Basophils Absolute: 0
Basophils Relative: 0
Eosinophils Absolute: 0
Eosinophils Relative: 0
Neutrophils Relative %: 83 — ABNORMAL HIGH

## 2011-01-29 LAB — BASIC METABOLIC PANEL
BUN: 77 — ABNORMAL HIGH
Chloride: 106
Creatinine, Ser: 2.23 — ABNORMAL HIGH
Glucose, Bld: 126 — ABNORMAL HIGH
Potassium: 3.8

## 2011-01-30 ENCOUNTER — Encounter (HOSPITAL_COMMUNITY): Payer: Medicare Other

## 2011-02-02 ENCOUNTER — Encounter (HOSPITAL_COMMUNITY): Payer: Medicare Other

## 2011-02-03 ENCOUNTER — Encounter (HOSPITAL_COMMUNITY): Payer: Self-pay | Attending: Interventional Cardiology

## 2011-02-03 DIAGNOSIS — Z5189 Encounter for other specified aftercare: Secondary | ICD-10-CM | POA: Insufficient documentation

## 2011-02-03 DIAGNOSIS — I10 Essential (primary) hypertension: Secondary | ICD-10-CM | POA: Insufficient documentation

## 2011-02-03 DIAGNOSIS — I4891 Unspecified atrial fibrillation: Secondary | ICD-10-CM | POA: Insufficient documentation

## 2011-02-03 DIAGNOSIS — E785 Hyperlipidemia, unspecified: Secondary | ICD-10-CM | POA: Insufficient documentation

## 2011-02-03 DIAGNOSIS — I2510 Atherosclerotic heart disease of native coronary artery without angina pectoris: Secondary | ICD-10-CM | POA: Insufficient documentation

## 2011-02-03 DIAGNOSIS — Z951 Presence of aortocoronary bypass graft: Secondary | ICD-10-CM | POA: Insufficient documentation

## 2011-02-03 DIAGNOSIS — I4892 Unspecified atrial flutter: Secondary | ICD-10-CM | POA: Insufficient documentation

## 2011-02-04 ENCOUNTER — Encounter (HOSPITAL_COMMUNITY): Payer: Medicare Other

## 2011-02-04 ENCOUNTER — Encounter (HOSPITAL_COMMUNITY): Payer: Self-pay

## 2011-02-05 ENCOUNTER — Encounter (HOSPITAL_COMMUNITY): Payer: Self-pay

## 2011-02-06 ENCOUNTER — Encounter (HOSPITAL_COMMUNITY): Payer: Medicare Other

## 2011-02-10 ENCOUNTER — Encounter (HOSPITAL_COMMUNITY): Payer: Self-pay

## 2011-02-11 ENCOUNTER — Encounter (HOSPITAL_COMMUNITY): Payer: Self-pay

## 2011-02-12 ENCOUNTER — Encounter (HOSPITAL_COMMUNITY): Payer: Self-pay

## 2011-02-17 ENCOUNTER — Encounter (HOSPITAL_COMMUNITY): Payer: Self-pay

## 2011-02-18 ENCOUNTER — Encounter (HOSPITAL_COMMUNITY): Payer: Self-pay

## 2011-02-19 ENCOUNTER — Encounter (HOSPITAL_COMMUNITY): Payer: Self-pay

## 2011-02-19 DIAGNOSIS — I10 Essential (primary) hypertension: Secondary | ICD-10-CM | POA: Insufficient documentation

## 2011-02-19 DIAGNOSIS — Z951 Presence of aortocoronary bypass graft: Secondary | ICD-10-CM | POA: Insufficient documentation

## 2011-02-19 DIAGNOSIS — I2510 Atherosclerotic heart disease of native coronary artery without angina pectoris: Secondary | ICD-10-CM | POA: Insufficient documentation

## 2011-02-19 DIAGNOSIS — E785 Hyperlipidemia, unspecified: Secondary | ICD-10-CM | POA: Insufficient documentation

## 2011-02-19 DIAGNOSIS — Z5189 Encounter for other specified aftercare: Secondary | ICD-10-CM | POA: Insufficient documentation

## 2011-02-19 DIAGNOSIS — I4891 Unspecified atrial fibrillation: Secondary | ICD-10-CM | POA: Insufficient documentation

## 2011-02-19 DIAGNOSIS — I4892 Unspecified atrial flutter: Secondary | ICD-10-CM | POA: Insufficient documentation

## 2011-02-20 ENCOUNTER — Other Ambulatory Visit: Payer: Self-pay | Admitting: Gastroenterology

## 2011-02-20 DIAGNOSIS — D689 Coagulation defect, unspecified: Secondary | ICD-10-CM

## 2011-02-24 ENCOUNTER — Encounter (HOSPITAL_COMMUNITY): Payer: Self-pay

## 2011-02-25 ENCOUNTER — Encounter (HOSPITAL_COMMUNITY): Payer: Self-pay

## 2011-02-26 ENCOUNTER — Encounter (HOSPITAL_COMMUNITY): Payer: Self-pay

## 2011-03-03 ENCOUNTER — Encounter (HOSPITAL_COMMUNITY): Payer: Self-pay

## 2011-03-04 ENCOUNTER — Encounter (HOSPITAL_COMMUNITY): Payer: Self-pay

## 2011-03-05 ENCOUNTER — Encounter (HOSPITAL_COMMUNITY): Payer: Self-pay

## 2011-03-10 ENCOUNTER — Encounter (HOSPITAL_COMMUNITY): Payer: Self-pay

## 2011-03-11 ENCOUNTER — Encounter (HOSPITAL_COMMUNITY): Payer: Self-pay

## 2011-03-12 ENCOUNTER — Encounter (HOSPITAL_COMMUNITY): Payer: Self-pay

## 2011-03-17 ENCOUNTER — Encounter (HOSPITAL_COMMUNITY)
Admission: RE | Admit: 2011-03-17 | Discharge: 2011-03-17 | Disposition: A | Discharge status: Home / Self Care | Payer: Self-pay | Source: Ambulatory Visit | Attending: Interventional Cardiology | Admitting: Interventional Cardiology

## 2011-03-18 ENCOUNTER — Encounter (HOSPITAL_COMMUNITY): Payer: Self-pay

## 2011-03-19 ENCOUNTER — Encounter (HOSPITAL_COMMUNITY): Payer: Self-pay

## 2011-03-24 ENCOUNTER — Encounter (HOSPITAL_COMMUNITY)
Admission: RE | Admit: 2011-03-24 | Discharge: 2011-03-24 | Disposition: A | Discharge status: Home / Self Care | Payer: Self-pay | Source: Ambulatory Visit | Attending: Interventional Cardiology | Admitting: Interventional Cardiology

## 2011-03-24 DIAGNOSIS — I4892 Unspecified atrial flutter: Secondary | ICD-10-CM | POA: Insufficient documentation

## 2011-03-24 DIAGNOSIS — Z951 Presence of aortocoronary bypass graft: Secondary | ICD-10-CM | POA: Insufficient documentation

## 2011-03-24 DIAGNOSIS — I10 Essential (primary) hypertension: Secondary | ICD-10-CM | POA: Insufficient documentation

## 2011-03-24 DIAGNOSIS — Z5189 Encounter for other specified aftercare: Secondary | ICD-10-CM | POA: Insufficient documentation

## 2011-03-24 DIAGNOSIS — I4891 Unspecified atrial fibrillation: Secondary | ICD-10-CM | POA: Insufficient documentation

## 2011-03-24 DIAGNOSIS — E785 Hyperlipidemia, unspecified: Secondary | ICD-10-CM | POA: Insufficient documentation

## 2011-03-24 DIAGNOSIS — I2510 Atherosclerotic heart disease of native coronary artery without angina pectoris: Secondary | ICD-10-CM | POA: Insufficient documentation

## 2011-03-25 ENCOUNTER — Encounter (HOSPITAL_COMMUNITY)
Admission: RE | Admit: 2011-03-25 | Discharge: 2011-03-25 | Disposition: A | Discharge status: Home / Self Care | Payer: Self-pay | Source: Ambulatory Visit | Attending: Interventional Cardiology | Admitting: Interventional Cardiology

## 2011-03-25 NOTE — Progress Notes (Signed)
0715- Pt was transitioning from the 1st to 2nd station when he reported his left forearm was itching. Pt pulled up his sleeve and was noted to have red patches that extend all the way up the arm. Pt was examined by one of the nurse case managers and it appears that pt may have shingles. Pt advised to consult his dermatologist.

## 2011-03-26 ENCOUNTER — Encounter (HOSPITAL_COMMUNITY): Payer: Self-pay

## 2011-03-31 ENCOUNTER — Encounter (HOSPITAL_COMMUNITY): Payer: Self-pay

## 2011-04-01 ENCOUNTER — Encounter (HOSPITAL_COMMUNITY): Payer: Self-pay

## 2011-04-01 ENCOUNTER — Other Ambulatory Visit: Payer: Self-pay | Admitting: Dermatology

## 2011-04-02 ENCOUNTER — Encounter (HOSPITAL_COMMUNITY): Payer: Self-pay

## 2011-04-07 ENCOUNTER — Encounter (HOSPITAL_COMMUNITY): Payer: Self-pay

## 2011-04-08 ENCOUNTER — Encounter (HOSPITAL_COMMUNITY): Payer: Self-pay

## 2011-04-09 ENCOUNTER — Encounter (HOSPITAL_COMMUNITY): Payer: Self-pay

## 2011-04-14 ENCOUNTER — Encounter (HOSPITAL_COMMUNITY): Payer: Self-pay

## 2011-04-15 ENCOUNTER — Encounter (HOSPITAL_COMMUNITY): Payer: Self-pay

## 2011-04-16 ENCOUNTER — Encounter (HOSPITAL_COMMUNITY): Payer: Self-pay

## 2011-04-21 ENCOUNTER — Encounter (HOSPITAL_COMMUNITY): Payer: Self-pay

## 2011-04-22 ENCOUNTER — Encounter (HOSPITAL_COMMUNITY): Payer: Self-pay

## 2011-04-23 ENCOUNTER — Encounter (HOSPITAL_COMMUNITY)
Admission: RE | Admit: 2011-04-23 | Discharge: 2011-04-23 | Disposition: A | Discharge status: Home / Self Care | Payer: Self-pay | Source: Ambulatory Visit | Attending: Interventional Cardiology | Admitting: Interventional Cardiology

## 2011-04-23 DIAGNOSIS — I2510 Atherosclerotic heart disease of native coronary artery without angina pectoris: Secondary | ICD-10-CM | POA: Insufficient documentation

## 2011-04-23 DIAGNOSIS — Z5189 Encounter for other specified aftercare: Secondary | ICD-10-CM | POA: Insufficient documentation

## 2011-04-23 DIAGNOSIS — I10 Essential (primary) hypertension: Secondary | ICD-10-CM | POA: Insufficient documentation

## 2011-04-23 DIAGNOSIS — Z951 Presence of aortocoronary bypass graft: Secondary | ICD-10-CM | POA: Insufficient documentation

## 2011-04-23 DIAGNOSIS — E785 Hyperlipidemia, unspecified: Secondary | ICD-10-CM | POA: Insufficient documentation

## 2011-04-23 DIAGNOSIS — I4891 Unspecified atrial fibrillation: Secondary | ICD-10-CM | POA: Insufficient documentation

## 2011-04-23 DIAGNOSIS — I4892 Unspecified atrial flutter: Secondary | ICD-10-CM | POA: Insufficient documentation

## 2011-04-28 ENCOUNTER — Encounter (HOSPITAL_COMMUNITY)
Admission: RE | Admit: 2011-04-28 | Discharge: 2011-04-28 | Disposition: A | Discharge status: Home / Self Care | Payer: Self-pay | Source: Ambulatory Visit | Attending: Interventional Cardiology | Admitting: Interventional Cardiology

## 2011-04-29 ENCOUNTER — Encounter (HOSPITAL_COMMUNITY)
Admission: RE | Admit: 2011-04-29 | Discharge: 2011-04-29 | Disposition: A | Discharge status: Home / Self Care | Payer: Self-pay | Source: Ambulatory Visit | Attending: Interventional Cardiology | Admitting: Interventional Cardiology

## 2011-04-30 ENCOUNTER — Encounter (HOSPITAL_COMMUNITY)
Admission: RE | Admit: 2011-04-30 | Discharge: 2011-04-30 | Disposition: A | Discharge status: Home / Self Care | Payer: Self-pay | Source: Ambulatory Visit | Attending: Interventional Cardiology | Admitting: Interventional Cardiology

## 2011-05-05 ENCOUNTER — Encounter (HOSPITAL_COMMUNITY)
Admission: RE | Admit: 2011-05-05 | Discharge: 2011-05-05 | Disposition: A | Discharge status: Home / Self Care | Payer: Self-pay | Source: Ambulatory Visit | Attending: Interventional Cardiology | Admitting: Interventional Cardiology

## 2011-05-06 ENCOUNTER — Encounter (HOSPITAL_COMMUNITY)
Admission: RE | Admit: 2011-05-06 | Discharge: 2011-05-06 | Disposition: A | Discharge status: Home / Self Care | Payer: Self-pay | Source: Ambulatory Visit | Attending: Interventional Cardiology | Admitting: Interventional Cardiology

## 2011-05-07 ENCOUNTER — Encounter (HOSPITAL_COMMUNITY)
Admission: RE | Admit: 2011-05-07 | Discharge: 2011-05-07 | Disposition: A | Discharge status: Home / Self Care | Payer: Self-pay | Source: Ambulatory Visit | Attending: Interventional Cardiology | Admitting: Interventional Cardiology

## 2011-05-12 ENCOUNTER — Encounter (HOSPITAL_COMMUNITY)
Admission: RE | Admit: 2011-05-12 | Discharge: 2011-05-12 | Disposition: A | Discharge status: Home / Self Care | Payer: Self-pay | Source: Ambulatory Visit | Attending: Interventional Cardiology | Admitting: Interventional Cardiology

## 2011-05-13 ENCOUNTER — Encounter (HOSPITAL_COMMUNITY)
Admission: RE | Admit: 2011-05-13 | Discharge: 2011-05-13 | Disposition: A | Discharge status: Home / Self Care | Payer: Self-pay | Source: Ambulatory Visit | Attending: Interventional Cardiology | Admitting: Interventional Cardiology

## 2011-05-14 ENCOUNTER — Encounter (HOSPITAL_COMMUNITY)
Admission: RE | Admit: 2011-05-14 | Discharge: 2011-05-14 | Disposition: A | Discharge status: Home / Self Care | Payer: Self-pay | Source: Ambulatory Visit | Attending: Interventional Cardiology | Admitting: Interventional Cardiology

## 2011-05-19 ENCOUNTER — Encounter (HOSPITAL_COMMUNITY)
Admission: RE | Admit: 2011-05-19 | Discharge: 2011-05-19 | Disposition: A | Discharge status: Home / Self Care | Payer: Self-pay | Source: Ambulatory Visit | Attending: Interventional Cardiology | Admitting: Interventional Cardiology

## 2011-05-20 ENCOUNTER — Encounter (HOSPITAL_COMMUNITY)
Admission: RE | Admit: 2011-05-20 | Discharge: 2011-05-20 | Disposition: A | Discharge status: Home / Self Care | Payer: Self-pay | Source: Ambulatory Visit | Attending: Interventional Cardiology | Admitting: Interventional Cardiology

## 2011-05-21 ENCOUNTER — Encounter (HOSPITAL_COMMUNITY)
Admission: RE | Admit: 2011-05-21 | Discharge: 2011-05-21 | Disposition: A | Discharge status: Home / Self Care | Payer: Self-pay | Source: Ambulatory Visit | Attending: Interventional Cardiology | Admitting: Interventional Cardiology

## 2011-05-26 ENCOUNTER — Encounter (HOSPITAL_COMMUNITY)
Admission: RE | Admit: 2011-05-26 | Discharge: 2011-05-26 | Disposition: A | Discharge status: Home / Self Care | Payer: Self-pay | Source: Ambulatory Visit | Attending: Interventional Cardiology | Admitting: Interventional Cardiology

## 2011-05-26 DIAGNOSIS — I2510 Atherosclerotic heart disease of native coronary artery without angina pectoris: Secondary | ICD-10-CM | POA: Insufficient documentation

## 2011-05-26 DIAGNOSIS — Z951 Presence of aortocoronary bypass graft: Secondary | ICD-10-CM | POA: Insufficient documentation

## 2011-05-26 DIAGNOSIS — I4891 Unspecified atrial fibrillation: Secondary | ICD-10-CM | POA: Insufficient documentation

## 2011-05-26 DIAGNOSIS — I4892 Unspecified atrial flutter: Secondary | ICD-10-CM | POA: Insufficient documentation

## 2011-05-26 DIAGNOSIS — E785 Hyperlipidemia, unspecified: Secondary | ICD-10-CM | POA: Insufficient documentation

## 2011-05-26 DIAGNOSIS — Z5189 Encounter for other specified aftercare: Secondary | ICD-10-CM | POA: Insufficient documentation

## 2011-05-26 DIAGNOSIS — I10 Essential (primary) hypertension: Secondary | ICD-10-CM | POA: Insufficient documentation

## 2011-05-27 ENCOUNTER — Encounter (HOSPITAL_COMMUNITY)
Admission: RE | Admit: 2011-05-27 | Discharge: 2011-05-27 | Disposition: A | Discharge status: Home / Self Care | Payer: Self-pay | Source: Ambulatory Visit | Attending: Interventional Cardiology | Admitting: Interventional Cardiology

## 2011-05-28 ENCOUNTER — Encounter (HOSPITAL_COMMUNITY)
Admission: RE | Admit: 2011-05-28 | Discharge: 2011-05-28 | Disposition: A | Discharge status: Home / Self Care | Payer: Self-pay | Source: Ambulatory Visit | Attending: Interventional Cardiology | Admitting: Interventional Cardiology

## 2011-06-02 ENCOUNTER — Encounter (HOSPITAL_COMMUNITY)
Admission: RE | Admit: 2011-06-02 | Discharge: 2011-06-02 | Disposition: A | Discharge status: Home / Self Care | Payer: Self-pay | Source: Ambulatory Visit | Attending: Interventional Cardiology | Admitting: Interventional Cardiology

## 2011-06-03 ENCOUNTER — Encounter (HOSPITAL_COMMUNITY)
Admission: RE | Admit: 2011-06-03 | Discharge: 2011-06-03 | Disposition: A | Discharge status: Home / Self Care | Payer: Self-pay | Source: Ambulatory Visit | Attending: Interventional Cardiology | Admitting: Interventional Cardiology

## 2011-06-04 ENCOUNTER — Encounter (HOSPITAL_COMMUNITY)
Admission: RE | Admit: 2011-06-04 | Discharge: 2011-06-04 | Disposition: A | Discharge status: Home / Self Care | Payer: Self-pay | Source: Ambulatory Visit | Attending: Interventional Cardiology | Admitting: Interventional Cardiology

## 2011-06-09 ENCOUNTER — Encounter (HOSPITAL_COMMUNITY)
Admission: RE | Admit: 2011-06-09 | Discharge: 2011-06-09 | Disposition: A | Discharge status: Home / Self Care | Payer: Self-pay | Source: Ambulatory Visit | Attending: Interventional Cardiology | Admitting: Interventional Cardiology

## 2011-06-10 ENCOUNTER — Encounter (HOSPITAL_COMMUNITY)
Admission: RE | Admit: 2011-06-10 | Discharge: 2011-06-10 | Disposition: A | Discharge status: Home / Self Care | Payer: Self-pay | Source: Ambulatory Visit | Attending: Interventional Cardiology | Admitting: Interventional Cardiology

## 2011-06-11 ENCOUNTER — Encounter (HOSPITAL_COMMUNITY)
Admission: RE | Admit: 2011-06-11 | Discharge: 2011-06-11 | Disposition: A | Discharge status: Home / Self Care | Payer: Self-pay | Source: Ambulatory Visit | Attending: Interventional Cardiology | Admitting: Interventional Cardiology

## 2011-06-16 ENCOUNTER — Encounter (HOSPITAL_COMMUNITY): Payer: Self-pay

## 2011-06-17 ENCOUNTER — Encounter (HOSPITAL_COMMUNITY)
Admission: RE | Admit: 2011-06-17 | Discharge: 2011-06-17 | Disposition: A | Discharge status: Home / Self Care | Payer: Self-pay | Source: Ambulatory Visit | Attending: Interventional Cardiology | Admitting: Interventional Cardiology

## 2011-06-18 ENCOUNTER — Encounter (HOSPITAL_COMMUNITY): Payer: Self-pay

## 2011-06-23 ENCOUNTER — Encounter (HOSPITAL_COMMUNITY)
Admission: RE | Admit: 2011-06-23 | Discharge: 2011-06-23 | Disposition: A | Discharge status: Home / Self Care | Payer: Self-pay | Source: Ambulatory Visit | Attending: Interventional Cardiology | Admitting: Interventional Cardiology

## 2011-06-23 DIAGNOSIS — I4891 Unspecified atrial fibrillation: Secondary | ICD-10-CM | POA: Insufficient documentation

## 2011-06-23 DIAGNOSIS — I10 Essential (primary) hypertension: Secondary | ICD-10-CM | POA: Insufficient documentation

## 2011-06-23 DIAGNOSIS — E785 Hyperlipidemia, unspecified: Secondary | ICD-10-CM | POA: Insufficient documentation

## 2011-06-23 DIAGNOSIS — I2510 Atherosclerotic heart disease of native coronary artery without angina pectoris: Secondary | ICD-10-CM | POA: Insufficient documentation

## 2011-06-23 DIAGNOSIS — Z5189 Encounter for other specified aftercare: Secondary | ICD-10-CM | POA: Insufficient documentation

## 2011-06-23 DIAGNOSIS — I4892 Unspecified atrial flutter: Secondary | ICD-10-CM | POA: Insufficient documentation

## 2011-06-23 DIAGNOSIS — Z951 Presence of aortocoronary bypass graft: Secondary | ICD-10-CM | POA: Insufficient documentation

## 2011-06-24 ENCOUNTER — Encounter (HOSPITAL_COMMUNITY)
Admission: RE | Admit: 2011-06-24 | Discharge: 2011-06-24 | Disposition: A | Discharge status: Home / Self Care | Payer: Self-pay | Source: Ambulatory Visit | Attending: Interventional Cardiology | Admitting: Interventional Cardiology

## 2011-06-25 ENCOUNTER — Encounter (HOSPITAL_COMMUNITY)
Admission: RE | Admit: 2011-06-25 | Discharge: 2011-06-25 | Disposition: A | Discharge status: Home / Self Care | Payer: Self-pay | Source: Ambulatory Visit | Attending: Interventional Cardiology | Admitting: Interventional Cardiology

## 2011-06-30 ENCOUNTER — Encounter (HOSPITAL_COMMUNITY)
Admission: RE | Admit: 2011-06-30 | Discharge: 2011-06-30 | Disposition: A | Discharge status: Home / Self Care | Payer: Self-pay | Source: Ambulatory Visit | Attending: Interventional Cardiology | Admitting: Interventional Cardiology

## 2011-07-01 ENCOUNTER — Encounter (HOSPITAL_COMMUNITY)
Admission: RE | Admit: 2011-07-01 | Discharge: 2011-07-01 | Disposition: A | Discharge status: Home / Self Care | Payer: Self-pay | Source: Ambulatory Visit | Attending: Interventional Cardiology | Admitting: Interventional Cardiology

## 2011-07-02 ENCOUNTER — Encounter (HOSPITAL_COMMUNITY)
Admission: RE | Admit: 2011-07-02 | Discharge: 2011-07-02 | Disposition: A | Discharge status: Home / Self Care | Payer: Self-pay | Source: Ambulatory Visit | Attending: Interventional Cardiology | Admitting: Interventional Cardiology

## 2011-07-07 ENCOUNTER — Encounter (HOSPITAL_COMMUNITY)
Admission: RE | Admit: 2011-07-07 | Discharge: 2011-07-07 | Disposition: A | Discharge status: Home / Self Care | Payer: Self-pay | Source: Ambulatory Visit | Attending: Interventional Cardiology | Admitting: Interventional Cardiology

## 2011-07-08 ENCOUNTER — Encounter (HOSPITAL_COMMUNITY)
Admission: RE | Admit: 2011-07-08 | Discharge: 2011-07-08 | Disposition: A | Discharge status: Home / Self Care | Payer: Self-pay | Source: Ambulatory Visit | Attending: Interventional Cardiology | Admitting: Interventional Cardiology

## 2011-07-09 ENCOUNTER — Encounter (HOSPITAL_COMMUNITY)
Admission: RE | Admit: 2011-07-09 | Discharge: 2011-07-09 | Disposition: A | Discharge status: Home / Self Care | Payer: Self-pay | Source: Ambulatory Visit | Attending: Interventional Cardiology | Admitting: Interventional Cardiology

## 2011-07-14 ENCOUNTER — Encounter (HOSPITAL_COMMUNITY)
Admission: RE | Admit: 2011-07-14 | Discharge: 2011-07-14 | Disposition: A | Discharge status: Home / Self Care | Payer: Self-pay | Source: Ambulatory Visit | Attending: Interventional Cardiology | Admitting: Interventional Cardiology

## 2011-07-15 ENCOUNTER — Encounter (HOSPITAL_COMMUNITY)
Admission: RE | Admit: 2011-07-15 | Discharge: 2011-07-15 | Disposition: A | Discharge status: Home / Self Care | Payer: Self-pay | Source: Ambulatory Visit | Attending: Interventional Cardiology | Admitting: Interventional Cardiology

## 2011-07-16 ENCOUNTER — Encounter (HOSPITAL_COMMUNITY)
Admission: RE | Admit: 2011-07-16 | Discharge: 2011-07-16 | Disposition: A | Discharge status: Home / Self Care | Payer: Self-pay | Source: Ambulatory Visit | Attending: Interventional Cardiology | Admitting: Interventional Cardiology

## 2011-07-21 ENCOUNTER — Encounter (HOSPITAL_COMMUNITY)
Admission: RE | Admit: 2011-07-21 | Discharge: 2011-07-21 | Disposition: A | Discharge status: Home / Self Care | Payer: Self-pay | Source: Ambulatory Visit | Attending: Interventional Cardiology | Admitting: Interventional Cardiology

## 2011-07-21 DIAGNOSIS — E785 Hyperlipidemia, unspecified: Secondary | ICD-10-CM | POA: Insufficient documentation

## 2011-07-21 DIAGNOSIS — I10 Essential (primary) hypertension: Secondary | ICD-10-CM | POA: Insufficient documentation

## 2011-07-21 DIAGNOSIS — I4892 Unspecified atrial flutter: Secondary | ICD-10-CM | POA: Insufficient documentation

## 2011-07-21 DIAGNOSIS — Z951 Presence of aortocoronary bypass graft: Secondary | ICD-10-CM | POA: Insufficient documentation

## 2011-07-21 DIAGNOSIS — I2510 Atherosclerotic heart disease of native coronary artery without angina pectoris: Secondary | ICD-10-CM | POA: Insufficient documentation

## 2011-07-21 DIAGNOSIS — Z5189 Encounter for other specified aftercare: Secondary | ICD-10-CM | POA: Insufficient documentation

## 2011-07-21 DIAGNOSIS — I4891 Unspecified atrial fibrillation: Secondary | ICD-10-CM | POA: Insufficient documentation

## 2011-07-22 ENCOUNTER — Encounter (HOSPITAL_COMMUNITY)
Admission: RE | Admit: 2011-07-22 | Discharge: 2011-07-22 | Disposition: A | Discharge status: Home / Self Care | Payer: Self-pay | Source: Ambulatory Visit | Attending: Interventional Cardiology | Admitting: Interventional Cardiology

## 2011-07-23 ENCOUNTER — Encounter (HOSPITAL_COMMUNITY)
Admission: RE | Admit: 2011-07-23 | Discharge: 2011-07-23 | Disposition: A | Discharge status: Home / Self Care | Payer: Self-pay | Source: Ambulatory Visit | Attending: Interventional Cardiology | Admitting: Interventional Cardiology

## 2011-07-28 ENCOUNTER — Encounter (HOSPITAL_COMMUNITY)
Admission: RE | Admit: 2011-07-28 | Discharge: 2011-07-28 | Disposition: A | Discharge status: Home / Self Care | Payer: Self-pay | Source: Ambulatory Visit | Attending: Interventional Cardiology | Admitting: Interventional Cardiology

## 2011-07-29 ENCOUNTER — Encounter (HOSPITAL_COMMUNITY)
Admission: RE | Admit: 2011-07-29 | Discharge: 2011-07-29 | Disposition: A | Discharge status: Home / Self Care | Payer: Self-pay | Source: Ambulatory Visit | Attending: Interventional Cardiology | Admitting: Interventional Cardiology

## 2011-07-30 ENCOUNTER — Encounter (HOSPITAL_COMMUNITY)
Admission: RE | Admit: 2011-07-30 | Discharge: 2011-07-30 | Disposition: A | Discharge status: Home / Self Care | Payer: Self-pay | Source: Ambulatory Visit | Attending: Interventional Cardiology | Admitting: Interventional Cardiology

## 2011-08-04 ENCOUNTER — Encounter (HOSPITAL_COMMUNITY)
Admission: RE | Admit: 2011-08-04 | Discharge: 2011-08-04 | Disposition: A | Discharge status: Home / Self Care | Payer: Self-pay | Source: Ambulatory Visit | Attending: Interventional Cardiology | Admitting: Interventional Cardiology

## 2011-08-04 NOTE — Progress Notes (Signed)
Pt c/o pain in right great toe pain, which he rates a 6-7 on the pain scale from gout. Pt currently taking Prednisone for gout.

## 2011-08-05 ENCOUNTER — Encounter (HOSPITAL_COMMUNITY)
Admission: RE | Admit: 2011-08-05 | Discharge: 2011-08-05 | Disposition: A | Discharge status: Home / Self Care | Payer: Self-pay | Source: Ambulatory Visit | Attending: Interventional Cardiology | Admitting: Interventional Cardiology

## 2011-08-06 ENCOUNTER — Encounter (HOSPITAL_COMMUNITY)
Admission: RE | Admit: 2011-08-06 | Discharge: 2011-08-06 | Disposition: A | Discharge status: Home / Self Care | Payer: Self-pay | Source: Ambulatory Visit | Attending: Interventional Cardiology | Admitting: Interventional Cardiology

## 2011-08-11 ENCOUNTER — Encounter (HOSPITAL_COMMUNITY)
Admission: RE | Admit: 2011-08-11 | Discharge: 2011-08-11 | Disposition: A | Discharge status: Home / Self Care | Payer: Self-pay | Source: Ambulatory Visit | Attending: Interventional Cardiology | Admitting: Interventional Cardiology

## 2011-08-12 ENCOUNTER — Encounter (HOSPITAL_COMMUNITY)
Admission: RE | Admit: 2011-08-12 | Discharge: 2011-08-12 | Disposition: A | Discharge status: Home / Self Care | Payer: Self-pay | Source: Ambulatory Visit | Attending: Interventional Cardiology | Admitting: Interventional Cardiology

## 2011-08-13 ENCOUNTER — Encounter (HOSPITAL_COMMUNITY)
Admission: RE | Admit: 2011-08-13 | Discharge: 2011-08-13 | Disposition: A | Discharge status: Home / Self Care | Payer: Self-pay | Source: Ambulatory Visit | Attending: Interventional Cardiology | Admitting: Interventional Cardiology

## 2011-08-18 ENCOUNTER — Encounter (HOSPITAL_COMMUNITY)
Admission: RE | Admit: 2011-08-18 | Discharge: 2011-08-18 | Disposition: A | Discharge status: Home / Self Care | Payer: Self-pay | Source: Ambulatory Visit | Attending: Interventional Cardiology | Admitting: Interventional Cardiology

## 2011-08-19 ENCOUNTER — Encounter (HOSPITAL_COMMUNITY)
Admission: RE | Admit: 2011-08-19 | Discharge: 2011-08-19 | Disposition: A | Discharge status: Home / Self Care | Payer: Self-pay | Source: Ambulatory Visit | Attending: Interventional Cardiology | Admitting: Interventional Cardiology

## 2011-08-19 DIAGNOSIS — E785 Hyperlipidemia, unspecified: Secondary | ICD-10-CM | POA: Insufficient documentation

## 2011-08-19 DIAGNOSIS — I4891 Unspecified atrial fibrillation: Secondary | ICD-10-CM | POA: Insufficient documentation

## 2011-08-19 DIAGNOSIS — Z951 Presence of aortocoronary bypass graft: Secondary | ICD-10-CM | POA: Insufficient documentation

## 2011-08-19 DIAGNOSIS — I2510 Atherosclerotic heart disease of native coronary artery without angina pectoris: Secondary | ICD-10-CM | POA: Insufficient documentation

## 2011-08-19 DIAGNOSIS — Z5189 Encounter for other specified aftercare: Secondary | ICD-10-CM | POA: Insufficient documentation

## 2011-08-19 DIAGNOSIS — I10 Essential (primary) hypertension: Secondary | ICD-10-CM | POA: Insufficient documentation

## 2011-08-19 DIAGNOSIS — I4892 Unspecified atrial flutter: Secondary | ICD-10-CM | POA: Insufficient documentation

## 2011-08-20 ENCOUNTER — Encounter (HOSPITAL_COMMUNITY)
Admission: RE | Admit: 2011-08-20 | Discharge: 2011-08-20 | Disposition: A | Discharge status: Home / Self Care | Payer: Self-pay | Source: Ambulatory Visit | Attending: Interventional Cardiology | Admitting: Interventional Cardiology

## 2011-08-25 ENCOUNTER — Encounter (HOSPITAL_COMMUNITY)
Admission: RE | Admit: 2011-08-25 | Discharge: 2011-08-25 | Disposition: A | Discharge status: Home / Self Care | Payer: Self-pay | Source: Ambulatory Visit | Attending: Interventional Cardiology | Admitting: Interventional Cardiology

## 2011-08-26 ENCOUNTER — Encounter (HOSPITAL_COMMUNITY)
Admission: RE | Admit: 2011-08-26 | Discharge: 2011-08-26 | Disposition: A | Discharge status: Home / Self Care | Payer: Self-pay | Source: Ambulatory Visit | Attending: Interventional Cardiology | Admitting: Interventional Cardiology

## 2011-08-27 ENCOUNTER — Encounter (HOSPITAL_COMMUNITY)
Admission: RE | Admit: 2011-08-27 | Discharge: 2011-08-27 | Disposition: A | Discharge status: Home / Self Care | Payer: Self-pay | Source: Ambulatory Visit | Attending: Interventional Cardiology | Admitting: Interventional Cardiology

## 2011-09-01 ENCOUNTER — Encounter (HOSPITAL_COMMUNITY): Payer: Self-pay

## 2011-09-02 ENCOUNTER — Encounter (HOSPITAL_COMMUNITY): Payer: Self-pay

## 2011-09-03 ENCOUNTER — Encounter (HOSPITAL_COMMUNITY): Payer: Self-pay

## 2011-09-08 ENCOUNTER — Encounter (HOSPITAL_COMMUNITY): Payer: Self-pay

## 2011-09-09 ENCOUNTER — Encounter (HOSPITAL_COMMUNITY): Payer: Self-pay

## 2011-09-10 ENCOUNTER — Encounter (HOSPITAL_COMMUNITY): Payer: Self-pay

## 2011-09-15 ENCOUNTER — Encounter (HOSPITAL_COMMUNITY): Payer: Self-pay

## 2011-09-16 ENCOUNTER — Encounter (HOSPITAL_COMMUNITY): Payer: Self-pay

## 2011-09-17 ENCOUNTER — Encounter (HOSPITAL_COMMUNITY): Payer: Self-pay

## 2011-09-22 ENCOUNTER — Other Ambulatory Visit: Payer: Self-pay | Admitting: Dermatology

## 2011-09-22 ENCOUNTER — Encounter (HOSPITAL_COMMUNITY): Payer: Self-pay

## 2011-09-22 DIAGNOSIS — I10 Essential (primary) hypertension: Secondary | ICD-10-CM | POA: Insufficient documentation

## 2011-09-22 DIAGNOSIS — I4891 Unspecified atrial fibrillation: Secondary | ICD-10-CM | POA: Insufficient documentation

## 2011-09-22 DIAGNOSIS — Z5189 Encounter for other specified aftercare: Secondary | ICD-10-CM | POA: Insufficient documentation

## 2011-09-22 DIAGNOSIS — I4892 Unspecified atrial flutter: Secondary | ICD-10-CM | POA: Insufficient documentation

## 2011-09-22 DIAGNOSIS — Z951 Presence of aortocoronary bypass graft: Secondary | ICD-10-CM | POA: Insufficient documentation

## 2011-09-22 DIAGNOSIS — E785 Hyperlipidemia, unspecified: Secondary | ICD-10-CM | POA: Insufficient documentation

## 2011-09-22 DIAGNOSIS — I2510 Atherosclerotic heart disease of native coronary artery without angina pectoris: Secondary | ICD-10-CM | POA: Insufficient documentation

## 2011-09-23 ENCOUNTER — Encounter (HOSPITAL_COMMUNITY): Payer: Self-pay

## 2011-09-24 ENCOUNTER — Encounter (HOSPITAL_COMMUNITY): Payer: Self-pay

## 2011-09-29 ENCOUNTER — Encounter (HOSPITAL_COMMUNITY)
Admission: RE | Admit: 2011-09-29 | Discharge: 2011-09-29 | Disposition: A | Discharge status: Home / Self Care | Payer: Self-pay | Source: Ambulatory Visit | Attending: Interventional Cardiology | Admitting: Interventional Cardiology

## 2011-09-30 ENCOUNTER — Encounter (HOSPITAL_COMMUNITY)
Admission: RE | Admit: 2011-09-30 | Discharge: 2011-09-30 | Disposition: A | Discharge status: Home / Self Care | Payer: Self-pay | Source: Ambulatory Visit | Attending: Interventional Cardiology | Admitting: Interventional Cardiology

## 2011-10-01 ENCOUNTER — Encounter (HOSPITAL_COMMUNITY)
Admission: RE | Admit: 2011-10-01 | Discharge: 2011-10-01 | Disposition: A | Discharge status: Home / Self Care | Payer: Self-pay | Source: Ambulatory Visit | Attending: Interventional Cardiology | Admitting: Interventional Cardiology

## 2011-10-06 ENCOUNTER — Encounter (HOSPITAL_COMMUNITY)
Admission: RE | Admit: 2011-10-06 | Discharge: 2011-10-06 | Disposition: A | Discharge status: Home / Self Care | Payer: Self-pay | Source: Ambulatory Visit | Attending: Interventional Cardiology | Admitting: Interventional Cardiology

## 2011-10-07 ENCOUNTER — Encounter (HOSPITAL_COMMUNITY)
Admission: RE | Admit: 2011-10-07 | Discharge: 2011-10-07 | Disposition: A | Discharge status: Home / Self Care | Payer: Self-pay | Source: Ambulatory Visit | Attending: Interventional Cardiology | Admitting: Interventional Cardiology

## 2011-10-08 ENCOUNTER — Encounter (HOSPITAL_COMMUNITY)
Admission: RE | Admit: 2011-10-08 | Discharge: 2011-10-08 | Disposition: A | Discharge status: Home / Self Care | Payer: Self-pay | Source: Ambulatory Visit | Attending: Interventional Cardiology | Admitting: Interventional Cardiology

## 2011-10-13 ENCOUNTER — Encounter (HOSPITAL_COMMUNITY): Payer: Self-pay

## 2011-10-14 ENCOUNTER — Encounter (HOSPITAL_COMMUNITY): Payer: Self-pay

## 2011-10-15 ENCOUNTER — Encounter (HOSPITAL_COMMUNITY): Payer: Self-pay

## 2011-10-20 ENCOUNTER — Encounter (HOSPITAL_COMMUNITY): Payer: Self-pay

## 2011-10-20 DIAGNOSIS — I4891 Unspecified atrial fibrillation: Secondary | ICD-10-CM | POA: Insufficient documentation

## 2011-10-20 DIAGNOSIS — I10 Essential (primary) hypertension: Secondary | ICD-10-CM | POA: Insufficient documentation

## 2011-10-20 DIAGNOSIS — Z5189 Encounter for other specified aftercare: Secondary | ICD-10-CM | POA: Insufficient documentation

## 2011-10-20 DIAGNOSIS — I2510 Atherosclerotic heart disease of native coronary artery without angina pectoris: Secondary | ICD-10-CM | POA: Insufficient documentation

## 2011-10-20 DIAGNOSIS — E785 Hyperlipidemia, unspecified: Secondary | ICD-10-CM | POA: Insufficient documentation

## 2011-10-20 DIAGNOSIS — I4892 Unspecified atrial flutter: Secondary | ICD-10-CM | POA: Insufficient documentation

## 2011-10-20 DIAGNOSIS — Z951 Presence of aortocoronary bypass graft: Secondary | ICD-10-CM | POA: Insufficient documentation

## 2011-10-21 ENCOUNTER — Encounter (HOSPITAL_COMMUNITY): Payer: Self-pay

## 2011-10-22 ENCOUNTER — Encounter (HOSPITAL_COMMUNITY): Payer: Self-pay

## 2011-10-27 ENCOUNTER — Encounter (HOSPITAL_COMMUNITY): Payer: Self-pay

## 2011-10-28 ENCOUNTER — Encounter (HOSPITAL_COMMUNITY): Payer: Self-pay

## 2011-10-29 ENCOUNTER — Encounter (HOSPITAL_COMMUNITY): Payer: Self-pay

## 2011-11-03 ENCOUNTER — Encounter (HOSPITAL_COMMUNITY)
Admission: RE | Admit: 2011-11-03 | Discharge: 2011-11-03 | Disposition: A | Discharge status: Home / Self Care | Payer: Self-pay | Source: Ambulatory Visit | Attending: Interventional Cardiology | Admitting: Interventional Cardiology

## 2011-11-04 ENCOUNTER — Encounter (HOSPITAL_COMMUNITY)
Admission: RE | Admit: 2011-11-04 | Discharge: 2011-11-04 | Disposition: A | Discharge status: Home / Self Care | Payer: Self-pay | Source: Ambulatory Visit | Attending: Interventional Cardiology | Admitting: Interventional Cardiology

## 2011-11-05 ENCOUNTER — Encounter (HOSPITAL_COMMUNITY)
Admission: RE | Admit: 2011-11-05 | Discharge: 2011-11-05 | Disposition: A | Discharge status: Home / Self Care | Payer: Self-pay | Source: Ambulatory Visit | Attending: Interventional Cardiology | Admitting: Interventional Cardiology

## 2011-11-10 ENCOUNTER — Encounter (HOSPITAL_COMMUNITY)
Admission: RE | Admit: 2011-11-10 | Discharge: 2011-11-10 | Disposition: A | Discharge status: Home / Self Care | Payer: Self-pay | Source: Ambulatory Visit | Attending: Interventional Cardiology | Admitting: Interventional Cardiology

## 2011-11-11 ENCOUNTER — Encounter (HOSPITAL_COMMUNITY)
Admission: RE | Admit: 2011-11-11 | Discharge: 2011-11-11 | Disposition: A | Discharge status: Home / Self Care | Payer: Self-pay | Source: Ambulatory Visit | Attending: Interventional Cardiology | Admitting: Interventional Cardiology

## 2011-11-12 ENCOUNTER — Encounter (HOSPITAL_COMMUNITY)
Admission: RE | Admit: 2011-11-12 | Discharge: 2011-11-12 | Disposition: A | Discharge status: Home / Self Care | Payer: Self-pay | Source: Ambulatory Visit | Attending: Interventional Cardiology | Admitting: Interventional Cardiology

## 2011-11-17 ENCOUNTER — Encounter (HOSPITAL_COMMUNITY)
Admission: RE | Admit: 2011-11-17 | Discharge: 2011-11-17 | Disposition: A | Discharge status: Home / Self Care | Payer: Self-pay | Source: Ambulatory Visit | Attending: Interventional Cardiology | Admitting: Interventional Cardiology

## 2011-11-18 ENCOUNTER — Encounter (HOSPITAL_COMMUNITY)
Admission: RE | Admit: 2011-11-18 | Discharge: 2011-11-18 | Disposition: A | Discharge status: Home / Self Care | Payer: Self-pay | Source: Ambulatory Visit | Attending: Interventional Cardiology | Admitting: Interventional Cardiology

## 2011-11-19 ENCOUNTER — Encounter (HOSPITAL_COMMUNITY)
Admission: RE | Admit: 2011-11-19 | Discharge: 2011-11-19 | Disposition: A | Discharge status: Home / Self Care | Payer: Self-pay | Source: Ambulatory Visit | Attending: Interventional Cardiology | Admitting: Interventional Cardiology

## 2011-11-19 DIAGNOSIS — I4891 Unspecified atrial fibrillation: Secondary | ICD-10-CM | POA: Insufficient documentation

## 2011-11-19 DIAGNOSIS — I10 Essential (primary) hypertension: Secondary | ICD-10-CM | POA: Insufficient documentation

## 2011-11-19 DIAGNOSIS — Z951 Presence of aortocoronary bypass graft: Secondary | ICD-10-CM | POA: Insufficient documentation

## 2011-11-19 DIAGNOSIS — I4892 Unspecified atrial flutter: Secondary | ICD-10-CM | POA: Insufficient documentation

## 2011-11-19 DIAGNOSIS — E785 Hyperlipidemia, unspecified: Secondary | ICD-10-CM | POA: Insufficient documentation

## 2011-11-19 DIAGNOSIS — Z5189 Encounter for other specified aftercare: Secondary | ICD-10-CM | POA: Insufficient documentation

## 2011-11-19 DIAGNOSIS — I2510 Atherosclerotic heart disease of native coronary artery without angina pectoris: Secondary | ICD-10-CM | POA: Insufficient documentation

## 2011-11-24 ENCOUNTER — Encounter (HOSPITAL_COMMUNITY)
Admission: RE | Admit: 2011-11-24 | Discharge: 2011-11-24 | Disposition: A | Discharge status: Home / Self Care | Payer: Self-pay | Source: Ambulatory Visit | Attending: Interventional Cardiology | Admitting: Interventional Cardiology

## 2011-11-25 ENCOUNTER — Encounter (HOSPITAL_COMMUNITY)
Admission: RE | Admit: 2011-11-25 | Discharge: 2011-11-25 | Disposition: A | Discharge status: Home / Self Care | Payer: Self-pay | Source: Ambulatory Visit | Attending: Interventional Cardiology | Admitting: Interventional Cardiology

## 2011-11-26 ENCOUNTER — Encounter (HOSPITAL_COMMUNITY)
Admission: RE | Admit: 2011-11-26 | Discharge: 2011-11-26 | Disposition: A | Discharge status: Home / Self Care | Payer: Self-pay | Source: Ambulatory Visit | Attending: Interventional Cardiology | Admitting: Interventional Cardiology

## 2011-12-01 ENCOUNTER — Encounter (HOSPITAL_COMMUNITY)
Admission: RE | Admit: 2011-12-01 | Discharge: 2011-12-01 | Disposition: A | Discharge status: Home / Self Care | Payer: Self-pay | Source: Ambulatory Visit | Attending: Interventional Cardiology | Admitting: Interventional Cardiology

## 2011-12-02 ENCOUNTER — Encounter (HOSPITAL_COMMUNITY)
Admission: RE | Admit: 2011-12-02 | Discharge: 2011-12-02 | Disposition: A | Discharge status: Home / Self Care | Payer: Self-pay | Source: Ambulatory Visit | Attending: Interventional Cardiology | Admitting: Interventional Cardiology

## 2011-12-03 ENCOUNTER — Encounter (HOSPITAL_COMMUNITY)
Admission: RE | Admit: 2011-12-03 | Discharge: 2011-12-03 | Disposition: A | Discharge status: Home / Self Care | Payer: Self-pay | Source: Ambulatory Visit | Attending: Interventional Cardiology | Admitting: Interventional Cardiology

## 2011-12-08 ENCOUNTER — Encounter (HOSPITAL_COMMUNITY)
Admission: RE | Admit: 2011-12-08 | Discharge: 2011-12-08 | Disposition: A | Discharge status: Home / Self Care | Payer: Self-pay | Source: Ambulatory Visit | Attending: Interventional Cardiology | Admitting: Interventional Cardiology

## 2011-12-09 ENCOUNTER — Encounter (HOSPITAL_COMMUNITY)
Admission: RE | Admit: 2011-12-09 | Discharge: 2011-12-09 | Disposition: A | Discharge status: Home / Self Care | Payer: Self-pay | Source: Ambulatory Visit | Attending: Interventional Cardiology | Admitting: Interventional Cardiology

## 2011-12-10 ENCOUNTER — Encounter (HOSPITAL_COMMUNITY): Payer: Self-pay

## 2011-12-15 ENCOUNTER — Encounter (HOSPITAL_COMMUNITY)
Admission: RE | Admit: 2011-12-15 | Discharge: 2011-12-15 | Disposition: A | Discharge status: Home / Self Care | Payer: Self-pay | Source: Ambulatory Visit | Attending: Interventional Cardiology | Admitting: Interventional Cardiology

## 2011-12-16 ENCOUNTER — Encounter (HOSPITAL_COMMUNITY)
Admission: RE | Admit: 2011-12-16 | Discharge: 2011-12-16 | Disposition: A | Discharge status: Home / Self Care | Payer: Self-pay | Source: Ambulatory Visit | Attending: Interventional Cardiology | Admitting: Interventional Cardiology

## 2011-12-17 ENCOUNTER — Encounter (HOSPITAL_COMMUNITY)
Admission: RE | Admit: 2011-12-17 | Discharge: 2011-12-17 | Disposition: A | Discharge status: Home / Self Care | Payer: Self-pay | Source: Ambulatory Visit | Attending: Interventional Cardiology | Admitting: Interventional Cardiology

## 2011-12-22 ENCOUNTER — Encounter (HOSPITAL_COMMUNITY)
Admission: RE | Admit: 2011-12-22 | Discharge: 2011-12-22 | Disposition: A | Discharge status: Home / Self Care | Payer: Self-pay | Source: Ambulatory Visit | Attending: Interventional Cardiology | Admitting: Interventional Cardiology

## 2011-12-22 DIAGNOSIS — Z5189 Encounter for other specified aftercare: Secondary | ICD-10-CM | POA: Insufficient documentation

## 2011-12-22 DIAGNOSIS — I2510 Atherosclerotic heart disease of native coronary artery without angina pectoris: Secondary | ICD-10-CM | POA: Insufficient documentation

## 2011-12-22 DIAGNOSIS — I4892 Unspecified atrial flutter: Secondary | ICD-10-CM | POA: Insufficient documentation

## 2011-12-22 DIAGNOSIS — Z951 Presence of aortocoronary bypass graft: Secondary | ICD-10-CM | POA: Insufficient documentation

## 2011-12-22 DIAGNOSIS — E785 Hyperlipidemia, unspecified: Secondary | ICD-10-CM | POA: Insufficient documentation

## 2011-12-22 DIAGNOSIS — I4891 Unspecified atrial fibrillation: Secondary | ICD-10-CM | POA: Insufficient documentation

## 2011-12-22 DIAGNOSIS — I10 Essential (primary) hypertension: Secondary | ICD-10-CM | POA: Insufficient documentation

## 2011-12-23 ENCOUNTER — Encounter (HOSPITAL_COMMUNITY)
Admission: RE | Admit: 2011-12-23 | Discharge: 2011-12-23 | Disposition: A | Discharge status: Home / Self Care | Payer: Self-pay | Source: Ambulatory Visit | Attending: Interventional Cardiology | Admitting: Interventional Cardiology

## 2011-12-24 ENCOUNTER — Encounter (HOSPITAL_COMMUNITY)
Admission: RE | Admit: 2011-12-24 | Discharge: 2011-12-24 | Disposition: A | Discharge status: Home / Self Care | Payer: Self-pay | Source: Ambulatory Visit | Attending: Interventional Cardiology | Admitting: Interventional Cardiology

## 2011-12-29 ENCOUNTER — Encounter (HOSPITAL_COMMUNITY)
Admission: RE | Admit: 2011-12-29 | Discharge: 2011-12-29 | Disposition: A | Discharge status: Home / Self Care | Payer: Self-pay | Source: Ambulatory Visit | Attending: Interventional Cardiology | Admitting: Interventional Cardiology

## 2011-12-30 ENCOUNTER — Encounter (HOSPITAL_COMMUNITY)
Admission: RE | Admit: 2011-12-30 | Discharge: 2011-12-30 | Disposition: A | Discharge status: Home / Self Care | Payer: Self-pay | Source: Ambulatory Visit | Attending: Interventional Cardiology | Admitting: Interventional Cardiology

## 2011-12-31 ENCOUNTER — Encounter (HOSPITAL_COMMUNITY)
Admission: RE | Admit: 2011-12-31 | Discharge: 2011-12-31 | Disposition: A | Discharge status: Home / Self Care | Payer: Self-pay | Source: Ambulatory Visit | Attending: Interventional Cardiology | Admitting: Interventional Cardiology

## 2012-01-05 ENCOUNTER — Encounter (HOSPITAL_COMMUNITY)
Admission: RE | Admit: 2012-01-05 | Discharge: 2012-01-05 | Disposition: A | Discharge status: Home / Self Care | Payer: Self-pay | Source: Ambulatory Visit | Attending: Interventional Cardiology | Admitting: Interventional Cardiology

## 2012-01-06 ENCOUNTER — Encounter (HOSPITAL_COMMUNITY)
Admission: RE | Admit: 2012-01-06 | Discharge: 2012-01-06 | Disposition: A | Discharge status: Home / Self Care | Payer: Self-pay | Source: Ambulatory Visit | Attending: Interventional Cardiology | Admitting: Interventional Cardiology

## 2012-01-07 ENCOUNTER — Encounter (HOSPITAL_COMMUNITY)
Admission: RE | Admit: 2012-01-07 | Discharge: 2012-01-07 | Disposition: A | Discharge status: Home / Self Care | Payer: Self-pay | Source: Ambulatory Visit | Attending: Interventional Cardiology | Admitting: Interventional Cardiology

## 2012-01-12 ENCOUNTER — Encounter (HOSPITAL_COMMUNITY)
Admission: RE | Admit: 2012-01-12 | Discharge: 2012-01-12 | Disposition: A | Discharge status: Home / Self Care | Payer: Self-pay | Source: Ambulatory Visit | Attending: Interventional Cardiology | Admitting: Interventional Cardiology

## 2012-01-13 ENCOUNTER — Encounter (HOSPITAL_COMMUNITY)
Admission: RE | Admit: 2012-01-13 | Discharge: 2012-01-13 | Disposition: A | Discharge status: Home / Self Care | Payer: Self-pay | Source: Ambulatory Visit | Attending: Interventional Cardiology | Admitting: Interventional Cardiology

## 2012-01-14 ENCOUNTER — Encounter (HOSPITAL_COMMUNITY): Payer: Self-pay

## 2012-01-19 ENCOUNTER — Encounter (HOSPITAL_COMMUNITY)
Admission: RE | Admit: 2012-01-19 | Discharge: 2012-01-19 | Disposition: A | Discharge status: Home / Self Care | Payer: Self-pay | Source: Ambulatory Visit | Attending: Interventional Cardiology | Admitting: Interventional Cardiology

## 2012-01-19 DIAGNOSIS — I2510 Atherosclerotic heart disease of native coronary artery without angina pectoris: Secondary | ICD-10-CM | POA: Insufficient documentation

## 2012-01-19 DIAGNOSIS — E785 Hyperlipidemia, unspecified: Secondary | ICD-10-CM | POA: Insufficient documentation

## 2012-01-19 DIAGNOSIS — Z951 Presence of aortocoronary bypass graft: Secondary | ICD-10-CM | POA: Insufficient documentation

## 2012-01-19 DIAGNOSIS — I10 Essential (primary) hypertension: Secondary | ICD-10-CM | POA: Insufficient documentation

## 2012-01-19 DIAGNOSIS — I4892 Unspecified atrial flutter: Secondary | ICD-10-CM | POA: Insufficient documentation

## 2012-01-19 DIAGNOSIS — I4891 Unspecified atrial fibrillation: Secondary | ICD-10-CM | POA: Insufficient documentation

## 2012-01-19 DIAGNOSIS — Z5189 Encounter for other specified aftercare: Secondary | ICD-10-CM | POA: Insufficient documentation

## 2012-01-20 ENCOUNTER — Encounter (HOSPITAL_COMMUNITY)
Admission: RE | Admit: 2012-01-20 | Discharge: 2012-01-20 | Disposition: A | Discharge status: Home / Self Care | Payer: Self-pay | Source: Ambulatory Visit | Attending: Interventional Cardiology | Admitting: Interventional Cardiology

## 2012-01-21 ENCOUNTER — Encounter (HOSPITAL_COMMUNITY)
Admission: RE | Admit: 2012-01-21 | Discharge: 2012-01-21 | Disposition: A | Discharge status: Home / Self Care | Payer: Self-pay | Source: Ambulatory Visit | Attending: Interventional Cardiology | Admitting: Interventional Cardiology

## 2012-01-26 ENCOUNTER — Encounter (HOSPITAL_COMMUNITY): Payer: Self-pay

## 2012-01-27 ENCOUNTER — Encounter (HOSPITAL_COMMUNITY)
Admission: RE | Admit: 2012-01-27 | Discharge: 2012-01-27 | Disposition: A | Discharge status: Home / Self Care | Payer: Self-pay | Source: Ambulatory Visit | Attending: Interventional Cardiology | Admitting: Interventional Cardiology

## 2012-01-28 ENCOUNTER — Encounter (HOSPITAL_COMMUNITY)
Admission: RE | Admit: 2012-01-28 | Discharge: 2012-01-28 | Disposition: A | Discharge status: Home / Self Care | Payer: Self-pay | Source: Ambulatory Visit | Attending: Interventional Cardiology | Admitting: Interventional Cardiology

## 2012-02-02 ENCOUNTER — Encounter (HOSPITAL_COMMUNITY)
Admission: RE | Admit: 2012-02-02 | Discharge: 2012-02-02 | Disposition: A | Discharge status: Home / Self Care | Payer: Self-pay | Source: Ambulatory Visit | Attending: Interventional Cardiology | Admitting: Interventional Cardiology

## 2012-02-03 ENCOUNTER — Encounter (HOSPITAL_COMMUNITY)
Admission: RE | Admit: 2012-02-03 | Discharge: 2012-02-03 | Disposition: A | Discharge status: Home / Self Care | Payer: Self-pay | Source: Ambulatory Visit | Attending: Interventional Cardiology | Admitting: Interventional Cardiology

## 2012-02-04 ENCOUNTER — Encounter (HOSPITAL_COMMUNITY): Payer: Self-pay

## 2012-02-09 ENCOUNTER — Encounter (HOSPITAL_COMMUNITY)
Admission: RE | Admit: 2012-02-09 | Discharge: 2012-02-09 | Disposition: A | Discharge status: Home / Self Care | Payer: Self-pay | Source: Ambulatory Visit | Attending: Interventional Cardiology | Admitting: Interventional Cardiology

## 2012-02-09 ENCOUNTER — Other Ambulatory Visit: Payer: Self-pay | Admitting: Dermatology

## 2012-02-10 ENCOUNTER — Encounter (HOSPITAL_COMMUNITY): Admission: RE | Admit: 2012-02-10 | Payer: Self-pay | Source: Ambulatory Visit

## 2012-02-11 ENCOUNTER — Encounter (HOSPITAL_COMMUNITY): Payer: Self-pay

## 2012-02-16 ENCOUNTER — Encounter (HOSPITAL_COMMUNITY)
Admission: RE | Admit: 2012-02-16 | Discharge: 2012-02-16 | Disposition: A | Discharge status: Home / Self Care | Payer: Self-pay | Source: Ambulatory Visit | Attending: Interventional Cardiology | Admitting: Interventional Cardiology

## 2012-02-17 ENCOUNTER — Encounter (HOSPITAL_COMMUNITY)
Admission: RE | Admit: 2012-02-17 | Discharge: 2012-02-17 | Disposition: A | Discharge status: Home / Self Care | Payer: Self-pay | Source: Ambulatory Visit | Attending: Interventional Cardiology | Admitting: Interventional Cardiology

## 2012-02-18 ENCOUNTER — Encounter (HOSPITAL_COMMUNITY)
Admission: RE | Admit: 2012-02-18 | Discharge: 2012-02-18 | Disposition: A | Discharge status: Home / Self Care | Payer: Self-pay | Source: Ambulatory Visit | Attending: Interventional Cardiology | Admitting: Interventional Cardiology

## 2012-02-22 DIAGNOSIS — I4891 Unspecified atrial fibrillation: Secondary | ICD-10-CM | POA: Insufficient documentation

## 2012-02-22 DIAGNOSIS — I2510 Atherosclerotic heart disease of native coronary artery without angina pectoris: Secondary | ICD-10-CM | POA: Insufficient documentation

## 2012-02-22 DIAGNOSIS — I10 Essential (primary) hypertension: Secondary | ICD-10-CM | POA: Insufficient documentation

## 2012-02-22 DIAGNOSIS — E785 Hyperlipidemia, unspecified: Secondary | ICD-10-CM | POA: Insufficient documentation

## 2012-02-22 DIAGNOSIS — Z5189 Encounter for other specified aftercare: Secondary | ICD-10-CM | POA: Insufficient documentation

## 2012-02-22 DIAGNOSIS — I4892 Unspecified atrial flutter: Secondary | ICD-10-CM | POA: Insufficient documentation

## 2012-02-22 DIAGNOSIS — Z951 Presence of aortocoronary bypass graft: Secondary | ICD-10-CM | POA: Insufficient documentation

## 2012-02-23 ENCOUNTER — Encounter (HOSPITAL_COMMUNITY)
Admission: RE | Admit: 2012-02-23 | Discharge: 2012-02-23 | Disposition: A | Discharge status: Home / Self Care | Payer: Self-pay | Source: Ambulatory Visit | Attending: Interventional Cardiology | Admitting: Interventional Cardiology

## 2012-02-24 ENCOUNTER — Encounter (HOSPITAL_COMMUNITY)
Admission: RE | Admit: 2012-02-24 | Discharge: 2012-02-24 | Disposition: A | Discharge status: Home / Self Care | Payer: Self-pay | Source: Ambulatory Visit | Attending: Interventional Cardiology | Admitting: Interventional Cardiology

## 2012-02-25 ENCOUNTER — Encounter (HOSPITAL_COMMUNITY)
Admission: RE | Admit: 2012-02-25 | Discharge: 2012-02-25 | Disposition: A | Discharge status: Home / Self Care | Payer: Self-pay | Source: Ambulatory Visit | Attending: Interventional Cardiology | Admitting: Interventional Cardiology

## 2012-03-01 ENCOUNTER — Encounter (HOSPITAL_COMMUNITY)
Admission: RE | Admit: 2012-03-01 | Discharge: 2012-03-01 | Disposition: A | Discharge status: Home / Self Care | Payer: Self-pay | Source: Ambulatory Visit | Attending: Interventional Cardiology | Admitting: Interventional Cardiology

## 2012-03-02 ENCOUNTER — Encounter (HOSPITAL_COMMUNITY)
Admission: RE | Admit: 2012-03-02 | Discharge: 2012-03-02 | Disposition: A | Discharge status: Home / Self Care | Payer: Self-pay | Source: Ambulatory Visit | Attending: Interventional Cardiology | Admitting: Interventional Cardiology

## 2012-03-03 ENCOUNTER — Encounter (HOSPITAL_COMMUNITY)
Admission: RE | Admit: 2012-03-03 | Discharge: 2012-03-03 | Disposition: A | Discharge status: Home / Self Care | Payer: Self-pay | Source: Ambulatory Visit | Attending: Interventional Cardiology | Admitting: Interventional Cardiology

## 2012-03-08 ENCOUNTER — Encounter (HOSPITAL_COMMUNITY)
Admission: RE | Admit: 2012-03-08 | Discharge: 2012-03-08 | Disposition: A | Discharge status: Home / Self Care | Payer: Self-pay | Source: Ambulatory Visit | Attending: Interventional Cardiology | Admitting: Interventional Cardiology

## 2012-03-09 ENCOUNTER — Encounter (HOSPITAL_COMMUNITY)
Admission: RE | Admit: 2012-03-09 | Discharge: 2012-03-09 | Disposition: A | Discharge status: Home / Self Care | Payer: Self-pay | Source: Ambulatory Visit | Attending: Interventional Cardiology | Admitting: Interventional Cardiology

## 2012-03-10 ENCOUNTER — Encounter (HOSPITAL_COMMUNITY)
Admission: RE | Admit: 2012-03-10 | Discharge: 2012-03-10 | Disposition: A | Discharge status: Home / Self Care | Payer: Self-pay | Source: Ambulatory Visit | Attending: Interventional Cardiology | Admitting: Interventional Cardiology

## 2012-03-15 ENCOUNTER — Encounter (HOSPITAL_COMMUNITY): Payer: Self-pay

## 2012-03-16 ENCOUNTER — Encounter (HOSPITAL_COMMUNITY): Payer: Self-pay

## 2012-03-22 ENCOUNTER — Encounter (HOSPITAL_COMMUNITY)
Admission: RE | Admit: 2012-03-22 | Discharge: 2012-03-22 | Disposition: A | Discharge status: Home / Self Care | Payer: Self-pay | Source: Ambulatory Visit | Attending: Interventional Cardiology | Admitting: Interventional Cardiology

## 2012-03-22 DIAGNOSIS — Z5189 Encounter for other specified aftercare: Secondary | ICD-10-CM | POA: Insufficient documentation

## 2012-03-22 DIAGNOSIS — Z951 Presence of aortocoronary bypass graft: Secondary | ICD-10-CM | POA: Insufficient documentation

## 2012-03-22 DIAGNOSIS — I4891 Unspecified atrial fibrillation: Secondary | ICD-10-CM | POA: Insufficient documentation

## 2012-03-22 DIAGNOSIS — E785 Hyperlipidemia, unspecified: Secondary | ICD-10-CM | POA: Insufficient documentation

## 2012-03-22 DIAGNOSIS — I10 Essential (primary) hypertension: Secondary | ICD-10-CM | POA: Insufficient documentation

## 2012-03-22 DIAGNOSIS — I4892 Unspecified atrial flutter: Secondary | ICD-10-CM | POA: Insufficient documentation

## 2012-03-22 DIAGNOSIS — I2510 Atherosclerotic heart disease of native coronary artery without angina pectoris: Secondary | ICD-10-CM | POA: Insufficient documentation

## 2012-03-23 ENCOUNTER — Encounter (HOSPITAL_COMMUNITY)
Admission: RE | Admit: 2012-03-23 | Discharge: 2012-03-23 | Disposition: A | Discharge status: Home / Self Care | Payer: Self-pay | Source: Ambulatory Visit | Attending: Interventional Cardiology | Admitting: Interventional Cardiology

## 2012-03-24 ENCOUNTER — Encounter (HOSPITAL_COMMUNITY)
Admission: RE | Admit: 2012-03-24 | Discharge: 2012-03-24 | Disposition: A | Discharge status: Home / Self Care | Payer: Self-pay | Source: Ambulatory Visit | Attending: Interventional Cardiology | Admitting: Interventional Cardiology

## 2012-03-29 ENCOUNTER — Encounter (HOSPITAL_COMMUNITY)
Admission: RE | Admit: 2012-03-29 | Discharge: 2012-03-29 | Disposition: A | Discharge status: Home / Self Care | Payer: Self-pay | Source: Ambulatory Visit | Attending: Interventional Cardiology | Admitting: Interventional Cardiology

## 2012-03-30 ENCOUNTER — Encounter (HOSPITAL_COMMUNITY)
Admission: RE | Admit: 2012-03-30 | Discharge: 2012-03-30 | Disposition: A | Discharge status: Home / Self Care | Payer: Self-pay | Source: Ambulatory Visit | Attending: Interventional Cardiology | Admitting: Interventional Cardiology

## 2012-03-31 ENCOUNTER — Encounter (HOSPITAL_COMMUNITY): Payer: Self-pay

## 2012-04-01 ENCOUNTER — Other Ambulatory Visit: Payer: Self-pay | Admitting: Dermatology

## 2012-04-05 ENCOUNTER — Encounter (HOSPITAL_COMMUNITY)
Admission: RE | Admit: 2012-04-05 | Discharge: 2012-04-05 | Disposition: A | Discharge status: Home / Self Care | Payer: Self-pay | Source: Ambulatory Visit | Attending: Interventional Cardiology | Admitting: Interventional Cardiology

## 2012-04-06 ENCOUNTER — Encounter (HOSPITAL_COMMUNITY)
Admission: RE | Admit: 2012-04-06 | Discharge: 2012-04-06 | Disposition: A | Discharge status: Home / Self Care | Payer: Self-pay | Source: Ambulatory Visit | Attending: Interventional Cardiology | Admitting: Interventional Cardiology

## 2012-04-07 ENCOUNTER — Encounter (HOSPITAL_COMMUNITY)
Admission: RE | Admit: 2012-04-07 | Discharge: 2012-04-07 | Disposition: A | Discharge status: Home / Self Care | Payer: Self-pay | Source: Ambulatory Visit | Attending: Interventional Cardiology | Admitting: Interventional Cardiology

## 2012-04-12 ENCOUNTER — Encounter (HOSPITAL_COMMUNITY): Payer: Self-pay

## 2012-04-14 ENCOUNTER — Encounter (HOSPITAL_COMMUNITY)
Admission: RE | Admit: 2012-04-14 | Discharge: 2012-04-14 | Disposition: A | Discharge status: Home / Self Care | Payer: Self-pay | Source: Ambulatory Visit | Attending: Interventional Cardiology | Admitting: Interventional Cardiology

## 2012-04-14 ENCOUNTER — Other Ambulatory Visit: Payer: Self-pay | Admitting: Dermatology

## 2012-04-19 ENCOUNTER — Encounter (HOSPITAL_COMMUNITY): Payer: Self-pay

## 2012-04-21 ENCOUNTER — Encounter (HOSPITAL_COMMUNITY): Payer: Self-pay

## 2012-04-21 DIAGNOSIS — E785 Hyperlipidemia, unspecified: Secondary | ICD-10-CM | POA: Insufficient documentation

## 2012-04-21 DIAGNOSIS — I4892 Unspecified atrial flutter: Secondary | ICD-10-CM | POA: Insufficient documentation

## 2012-04-21 DIAGNOSIS — I4891 Unspecified atrial fibrillation: Secondary | ICD-10-CM | POA: Insufficient documentation

## 2012-04-21 DIAGNOSIS — I2510 Atherosclerotic heart disease of native coronary artery without angina pectoris: Secondary | ICD-10-CM | POA: Insufficient documentation

## 2012-04-21 DIAGNOSIS — Z951 Presence of aortocoronary bypass graft: Secondary | ICD-10-CM | POA: Insufficient documentation

## 2012-04-21 DIAGNOSIS — I10 Essential (primary) hypertension: Secondary | ICD-10-CM | POA: Insufficient documentation

## 2012-04-21 DIAGNOSIS — Z5189 Encounter for other specified aftercare: Secondary | ICD-10-CM | POA: Insufficient documentation

## 2012-04-26 ENCOUNTER — Encounter (HOSPITAL_COMMUNITY): Payer: Self-pay

## 2012-04-27 ENCOUNTER — Encounter (HOSPITAL_COMMUNITY): Payer: Self-pay

## 2012-04-28 ENCOUNTER — Encounter (HOSPITAL_COMMUNITY): Payer: Self-pay

## 2012-05-03 ENCOUNTER — Encounter (HOSPITAL_COMMUNITY)
Admission: RE | Admit: 2012-05-03 | Discharge: 2012-05-03 | Disposition: A | Discharge status: Home / Self Care | Payer: Self-pay | Source: Ambulatory Visit | Attending: Interventional Cardiology | Admitting: Interventional Cardiology

## 2012-05-04 ENCOUNTER — Encounter (HOSPITAL_COMMUNITY)
Admission: RE | Admit: 2012-05-04 | Discharge: 2012-05-04 | Disposition: A | Discharge status: Home / Self Care | Payer: Self-pay | Source: Ambulatory Visit | Attending: Interventional Cardiology | Admitting: Interventional Cardiology

## 2012-05-05 ENCOUNTER — Encounter (HOSPITAL_COMMUNITY)
Admission: RE | Admit: 2012-05-05 | Discharge: 2012-05-05 | Disposition: A | Discharge status: Home / Self Care | Payer: Self-pay | Source: Ambulatory Visit | Attending: Interventional Cardiology | Admitting: Interventional Cardiology

## 2012-05-10 ENCOUNTER — Encounter (HOSPITAL_COMMUNITY)
Admission: RE | Admit: 2012-05-10 | Discharge: 2012-05-10 | Disposition: A | Discharge status: Home / Self Care | Payer: Self-pay | Source: Ambulatory Visit | Attending: Interventional Cardiology | Admitting: Interventional Cardiology

## 2012-05-11 ENCOUNTER — Encounter (HOSPITAL_COMMUNITY): Payer: Self-pay

## 2012-05-12 ENCOUNTER — Encounter (HOSPITAL_COMMUNITY): Payer: Self-pay

## 2012-05-17 ENCOUNTER — Encounter (HOSPITAL_COMMUNITY): Payer: Self-pay

## 2012-05-18 ENCOUNTER — Encounter (HOSPITAL_COMMUNITY): Payer: Self-pay

## 2012-05-19 ENCOUNTER — Encounter (HOSPITAL_COMMUNITY): Payer: Self-pay

## 2012-05-23 ENCOUNTER — Other Ambulatory Visit: Payer: Self-pay | Admitting: Internal Medicine

## 2012-05-23 DIAGNOSIS — E041 Nontoxic single thyroid nodule: Secondary | ICD-10-CM

## 2012-05-24 ENCOUNTER — Encounter (HOSPITAL_COMMUNITY): Payer: Self-pay

## 2012-05-24 ENCOUNTER — Ambulatory Visit
Admission: RE | Admit: 2012-05-24 | Discharge: 2012-05-24 | Disposition: A | Discharge status: Home / Self Care | Payer: Medicare Other | Source: Ambulatory Visit | Attending: Internal Medicine | Admitting: Internal Medicine

## 2012-05-24 DIAGNOSIS — I4892 Unspecified atrial flutter: Secondary | ICD-10-CM | POA: Insufficient documentation

## 2012-05-24 DIAGNOSIS — I4891 Unspecified atrial fibrillation: Secondary | ICD-10-CM | POA: Insufficient documentation

## 2012-05-24 DIAGNOSIS — Z951 Presence of aortocoronary bypass graft: Secondary | ICD-10-CM | POA: Insufficient documentation

## 2012-05-24 DIAGNOSIS — E785 Hyperlipidemia, unspecified: Secondary | ICD-10-CM | POA: Insufficient documentation

## 2012-05-24 DIAGNOSIS — Z5189 Encounter for other specified aftercare: Secondary | ICD-10-CM | POA: Insufficient documentation

## 2012-05-24 DIAGNOSIS — I10 Essential (primary) hypertension: Secondary | ICD-10-CM | POA: Insufficient documentation

## 2012-05-24 DIAGNOSIS — I2510 Atherosclerotic heart disease of native coronary artery without angina pectoris: Secondary | ICD-10-CM | POA: Insufficient documentation

## 2012-05-24 DIAGNOSIS — E041 Nontoxic single thyroid nodule: Secondary | ICD-10-CM

## 2012-05-25 ENCOUNTER — Encounter (HOSPITAL_COMMUNITY): Payer: Self-pay

## 2012-05-26 ENCOUNTER — Encounter (HOSPITAL_COMMUNITY): Payer: Self-pay

## 2012-05-31 ENCOUNTER — Encounter (HOSPITAL_COMMUNITY)
Admission: RE | Admit: 2012-05-31 | Discharge: 2012-05-31 | Disposition: A | Discharge status: Home / Self Care | Payer: Self-pay | Source: Ambulatory Visit | Attending: Interventional Cardiology | Admitting: Interventional Cardiology

## 2012-06-01 ENCOUNTER — Encounter (HOSPITAL_COMMUNITY): Payer: Self-pay

## 2012-06-02 ENCOUNTER — Encounter (HOSPITAL_COMMUNITY): Payer: Self-pay

## 2012-06-07 ENCOUNTER — Encounter (HOSPITAL_COMMUNITY)
Admission: RE | Admit: 2012-06-07 | Discharge: 2012-06-07 | Disposition: A | Discharge status: Home / Self Care | Payer: Self-pay | Source: Ambulatory Visit | Attending: Interventional Cardiology | Admitting: Interventional Cardiology

## 2012-06-08 ENCOUNTER — Encounter (HOSPITAL_COMMUNITY)
Admission: RE | Admit: 2012-06-08 | Discharge: 2012-06-08 | Disposition: A | Discharge status: Home / Self Care | Payer: Self-pay | Source: Ambulatory Visit | Attending: Interventional Cardiology | Admitting: Interventional Cardiology

## 2012-06-09 ENCOUNTER — Encounter (HOSPITAL_COMMUNITY)
Admission: RE | Admit: 2012-06-09 | Discharge: 2012-06-09 | Disposition: A | Discharge status: Home / Self Care | Payer: Self-pay | Source: Ambulatory Visit | Attending: Interventional Cardiology | Admitting: Interventional Cardiology

## 2012-06-14 ENCOUNTER — Encounter (HOSPITAL_COMMUNITY)
Admission: RE | Admit: 2012-06-14 | Discharge: 2012-06-14 | Disposition: A | Discharge status: Home / Self Care | Payer: Self-pay | Source: Ambulatory Visit | Attending: Interventional Cardiology | Admitting: Interventional Cardiology

## 2012-06-15 ENCOUNTER — Encounter (HOSPITAL_COMMUNITY): Payer: Self-pay

## 2012-06-16 ENCOUNTER — Encounter (HOSPITAL_COMMUNITY)
Admission: RE | Admit: 2012-06-16 | Discharge: 2012-06-16 | Disposition: A | Discharge status: Home / Self Care | Payer: Self-pay | Source: Ambulatory Visit | Attending: Interventional Cardiology | Admitting: Interventional Cardiology

## 2012-06-21 ENCOUNTER — Encounter (HOSPITAL_COMMUNITY): Payer: Self-pay

## 2012-06-21 DIAGNOSIS — I4892 Unspecified atrial flutter: Secondary | ICD-10-CM | POA: Insufficient documentation

## 2012-06-21 DIAGNOSIS — I2510 Atherosclerotic heart disease of native coronary artery without angina pectoris: Secondary | ICD-10-CM | POA: Insufficient documentation

## 2012-06-21 DIAGNOSIS — Z951 Presence of aortocoronary bypass graft: Secondary | ICD-10-CM | POA: Insufficient documentation

## 2012-06-21 DIAGNOSIS — I10 Essential (primary) hypertension: Secondary | ICD-10-CM | POA: Insufficient documentation

## 2012-06-21 DIAGNOSIS — Z5189 Encounter for other specified aftercare: Secondary | ICD-10-CM | POA: Insufficient documentation

## 2012-06-21 DIAGNOSIS — I4891 Unspecified atrial fibrillation: Secondary | ICD-10-CM | POA: Insufficient documentation

## 2012-06-21 DIAGNOSIS — E785 Hyperlipidemia, unspecified: Secondary | ICD-10-CM | POA: Insufficient documentation

## 2012-06-22 ENCOUNTER — Encounter (HOSPITAL_COMMUNITY): Payer: Self-pay

## 2012-06-23 ENCOUNTER — Encounter (HOSPITAL_COMMUNITY): Payer: Self-pay

## 2012-06-28 ENCOUNTER — Encounter (HOSPITAL_COMMUNITY)
Admission: RE | Admit: 2012-06-28 | Discharge: 2012-06-28 | Disposition: A | Discharge status: Home / Self Care | Payer: Self-pay | Source: Ambulatory Visit | Attending: Interventional Cardiology | Admitting: Interventional Cardiology

## 2012-06-29 ENCOUNTER — Encounter (HOSPITAL_COMMUNITY)
Admission: RE | Admit: 2012-06-29 | Discharge: 2012-06-29 | Disposition: A | Discharge status: Home / Self Care | Payer: Self-pay | Source: Ambulatory Visit | Attending: Interventional Cardiology | Admitting: Interventional Cardiology

## 2012-06-30 ENCOUNTER — Encounter (HOSPITAL_COMMUNITY): Payer: Self-pay

## 2012-07-05 ENCOUNTER — Encounter (HOSPITAL_COMMUNITY): Payer: Self-pay

## 2012-07-06 ENCOUNTER — Encounter (HOSPITAL_COMMUNITY): Payer: Self-pay

## 2012-07-07 ENCOUNTER — Encounter (HOSPITAL_COMMUNITY): Payer: Self-pay

## 2012-07-12 ENCOUNTER — Encounter (HOSPITAL_COMMUNITY)
Admission: RE | Admit: 2012-07-12 | Discharge: 2012-07-12 | Disposition: A | Discharge status: Home / Self Care | Payer: Self-pay | Source: Ambulatory Visit | Attending: Interventional Cardiology | Admitting: Interventional Cardiology

## 2012-07-13 ENCOUNTER — Encounter (HOSPITAL_COMMUNITY)
Admission: RE | Admit: 2012-07-13 | Discharge: 2012-07-13 | Disposition: A | Discharge status: Home / Self Care | Payer: Self-pay | Source: Ambulatory Visit | Attending: Interventional Cardiology | Admitting: Interventional Cardiology

## 2012-07-14 ENCOUNTER — Encounter (HOSPITAL_COMMUNITY)
Admission: RE | Admit: 2012-07-14 | Discharge: 2012-07-14 | Disposition: A | Discharge status: Home / Self Care | Payer: Self-pay | Source: Ambulatory Visit | Attending: Interventional Cardiology | Admitting: Interventional Cardiology

## 2012-07-19 ENCOUNTER — Encounter (HOSPITAL_COMMUNITY)
Admission: RE | Admit: 2012-07-19 | Discharge: 2012-07-19 | Disposition: A | Discharge status: Home / Self Care | Payer: Self-pay | Source: Ambulatory Visit | Attending: Interventional Cardiology | Admitting: Interventional Cardiology

## 2012-07-19 DIAGNOSIS — I4892 Unspecified atrial flutter: Secondary | ICD-10-CM | POA: Insufficient documentation

## 2012-07-19 DIAGNOSIS — Z951 Presence of aortocoronary bypass graft: Secondary | ICD-10-CM | POA: Insufficient documentation

## 2012-07-19 DIAGNOSIS — I10 Essential (primary) hypertension: Secondary | ICD-10-CM | POA: Insufficient documentation

## 2012-07-19 DIAGNOSIS — I2510 Atherosclerotic heart disease of native coronary artery without angina pectoris: Secondary | ICD-10-CM | POA: Insufficient documentation

## 2012-07-19 DIAGNOSIS — I4891 Unspecified atrial fibrillation: Secondary | ICD-10-CM | POA: Insufficient documentation

## 2012-07-19 DIAGNOSIS — E785 Hyperlipidemia, unspecified: Secondary | ICD-10-CM | POA: Insufficient documentation

## 2012-07-19 DIAGNOSIS — Z5189 Encounter for other specified aftercare: Secondary | ICD-10-CM | POA: Insufficient documentation

## 2012-07-20 ENCOUNTER — Encounter (HOSPITAL_COMMUNITY)
Admission: RE | Admit: 2012-07-20 | Discharge: 2012-07-20 | Disposition: A | Discharge status: Home / Self Care | Payer: Self-pay | Source: Ambulatory Visit | Attending: Interventional Cardiology | Admitting: Interventional Cardiology

## 2012-07-21 ENCOUNTER — Encounter (HOSPITAL_COMMUNITY)
Admission: RE | Admit: 2012-07-21 | Discharge: 2012-07-21 | Disposition: A | Discharge status: Home / Self Care | Payer: Self-pay | Source: Ambulatory Visit | Attending: Interventional Cardiology | Admitting: Interventional Cardiology

## 2012-07-26 ENCOUNTER — Encounter (HOSPITAL_COMMUNITY)
Admission: RE | Admit: 2012-07-26 | Discharge: 2012-07-26 | Disposition: A | Discharge status: Home / Self Care | Payer: Self-pay | Source: Ambulatory Visit | Attending: Interventional Cardiology | Admitting: Interventional Cardiology

## 2012-07-27 ENCOUNTER — Encounter (HOSPITAL_COMMUNITY)
Admission: RE | Admit: 2012-07-27 | Discharge: 2012-07-27 | Disposition: A | Discharge status: Home / Self Care | Payer: Self-pay | Source: Ambulatory Visit | Attending: Interventional Cardiology | Admitting: Interventional Cardiology

## 2012-07-28 ENCOUNTER — Encounter (HOSPITAL_COMMUNITY)
Admission: RE | Admit: 2012-07-28 | Discharge: 2012-07-28 | Disposition: A | Discharge status: Home / Self Care | Payer: Self-pay | Source: Ambulatory Visit | Attending: Interventional Cardiology | Admitting: Interventional Cardiology

## 2012-08-02 ENCOUNTER — Encounter (HOSPITAL_COMMUNITY): Payer: Self-pay

## 2012-08-03 ENCOUNTER — Encounter (HOSPITAL_COMMUNITY)
Admission: RE | Admit: 2012-08-03 | Discharge: 2012-08-03 | Disposition: A | Discharge status: Home / Self Care | Payer: Self-pay | Source: Ambulatory Visit | Attending: Interventional Cardiology | Admitting: Interventional Cardiology

## 2012-08-04 ENCOUNTER — Encounter (HOSPITAL_COMMUNITY)
Admission: RE | Admit: 2012-08-04 | Discharge: 2012-08-04 | Disposition: A | Discharge status: Home / Self Care | Payer: Self-pay | Source: Ambulatory Visit | Attending: Interventional Cardiology | Admitting: Interventional Cardiology

## 2012-08-09 ENCOUNTER — Encounter (HOSPITAL_COMMUNITY): Payer: Self-pay

## 2012-08-10 ENCOUNTER — Encounter (HOSPITAL_COMMUNITY): Payer: Self-pay

## 2012-08-11 ENCOUNTER — Encounter (HOSPITAL_COMMUNITY): Payer: Self-pay

## 2012-08-16 ENCOUNTER — Encounter (HOSPITAL_COMMUNITY)
Admission: RE | Admit: 2012-08-16 | Discharge: 2012-08-16 | Disposition: A | Discharge status: Home / Self Care | Payer: Self-pay | Source: Ambulatory Visit | Attending: Interventional Cardiology | Admitting: Interventional Cardiology

## 2012-08-17 ENCOUNTER — Encounter (HOSPITAL_COMMUNITY)
Admission: RE | Admit: 2012-08-17 | Discharge: 2012-08-17 | Disposition: A | Discharge status: Home / Self Care | Payer: Self-pay | Source: Ambulatory Visit | Attending: Interventional Cardiology | Admitting: Interventional Cardiology

## 2012-08-18 ENCOUNTER — Encounter (HOSPITAL_COMMUNITY): Payer: Self-pay

## 2012-08-18 DIAGNOSIS — Z951 Presence of aortocoronary bypass graft: Secondary | ICD-10-CM | POA: Insufficient documentation

## 2012-08-18 DIAGNOSIS — I10 Essential (primary) hypertension: Secondary | ICD-10-CM | POA: Insufficient documentation

## 2012-08-18 DIAGNOSIS — E785 Hyperlipidemia, unspecified: Secondary | ICD-10-CM | POA: Insufficient documentation

## 2012-08-18 DIAGNOSIS — I4892 Unspecified atrial flutter: Secondary | ICD-10-CM | POA: Insufficient documentation

## 2012-08-18 DIAGNOSIS — I2510 Atherosclerotic heart disease of native coronary artery without angina pectoris: Secondary | ICD-10-CM | POA: Insufficient documentation

## 2012-08-18 DIAGNOSIS — I4891 Unspecified atrial fibrillation: Secondary | ICD-10-CM | POA: Insufficient documentation

## 2012-08-18 DIAGNOSIS — Z5189 Encounter for other specified aftercare: Secondary | ICD-10-CM | POA: Insufficient documentation

## 2012-08-23 ENCOUNTER — Encounter (HOSPITAL_COMMUNITY): Payer: Self-pay

## 2012-08-24 ENCOUNTER — Encounter (HOSPITAL_COMMUNITY)
Admission: RE | Admit: 2012-08-24 | Discharge: 2012-08-24 | Disposition: A | Discharge status: Home / Self Care | Payer: Self-pay | Source: Ambulatory Visit | Attending: Interventional Cardiology | Admitting: Interventional Cardiology

## 2012-08-25 ENCOUNTER — Encounter (HOSPITAL_COMMUNITY)
Admission: RE | Admit: 2012-08-25 | Discharge: 2012-08-25 | Disposition: A | Discharge status: Home / Self Care | Payer: Self-pay | Source: Ambulatory Visit | Attending: Interventional Cardiology | Admitting: Interventional Cardiology

## 2012-08-30 ENCOUNTER — Encounter (HOSPITAL_COMMUNITY)
Admission: RE | Admit: 2012-08-30 | Discharge: 2012-08-30 | Disposition: A | Discharge status: Home / Self Care | Payer: Self-pay | Source: Ambulatory Visit | Attending: Interventional Cardiology | Admitting: Interventional Cardiology

## 2012-08-31 ENCOUNTER — Encounter (HOSPITAL_COMMUNITY)
Admission: RE | Admit: 2012-08-31 | Discharge: 2012-08-31 | Disposition: A | Discharge status: Home / Self Care | Payer: Self-pay | Source: Ambulatory Visit | Attending: Interventional Cardiology | Admitting: Interventional Cardiology

## 2012-09-01 ENCOUNTER — Encounter (HOSPITAL_COMMUNITY)
Admission: RE | Admit: 2012-09-01 | Discharge: 2012-09-01 | Disposition: A | Discharge status: Home / Self Care | Payer: Self-pay | Source: Ambulatory Visit | Attending: Interventional Cardiology | Admitting: Interventional Cardiology

## 2012-09-06 ENCOUNTER — Encounter (HOSPITAL_COMMUNITY)
Admission: RE | Admit: 2012-09-06 | Discharge: 2012-09-06 | Disposition: A | Discharge status: Home / Self Care | Payer: Self-pay | Source: Ambulatory Visit | Attending: Interventional Cardiology | Admitting: Interventional Cardiology

## 2012-09-07 ENCOUNTER — Encounter (HOSPITAL_COMMUNITY)
Admission: RE | Admit: 2012-09-07 | Discharge: 2012-09-07 | Disposition: A | Discharge status: Home / Self Care | Payer: Self-pay | Source: Ambulatory Visit | Attending: Interventional Cardiology | Admitting: Interventional Cardiology

## 2012-09-08 ENCOUNTER — Encounter (HOSPITAL_COMMUNITY)
Admission: RE | Admit: 2012-09-08 | Discharge: 2012-09-08 | Disposition: A | Discharge status: Home / Self Care | Payer: Self-pay | Source: Ambulatory Visit | Attending: Interventional Cardiology | Admitting: Interventional Cardiology

## 2012-09-13 ENCOUNTER — Encounter (HOSPITAL_COMMUNITY)
Admission: RE | Admit: 2012-09-13 | Discharge: 2012-09-13 | Disposition: A | Discharge status: Home / Self Care | Payer: Self-pay | Source: Ambulatory Visit | Attending: Interventional Cardiology | Admitting: Interventional Cardiology

## 2012-09-14 ENCOUNTER — Encounter (HOSPITAL_COMMUNITY)
Admission: RE | Admit: 2012-09-14 | Discharge: 2012-09-14 | Disposition: A | Discharge status: Home / Self Care | Payer: Self-pay | Source: Ambulatory Visit | Attending: Interventional Cardiology | Admitting: Interventional Cardiology

## 2012-09-15 ENCOUNTER — Encounter (HOSPITAL_COMMUNITY)
Admission: RE | Admit: 2012-09-15 | Discharge: 2012-09-15 | Disposition: A | Discharge status: Home / Self Care | Payer: Self-pay | Source: Ambulatory Visit | Attending: Interventional Cardiology | Admitting: Interventional Cardiology

## 2012-09-20 ENCOUNTER — Encounter (HOSPITAL_COMMUNITY)
Admission: RE | Admit: 2012-09-20 | Discharge: 2012-09-20 | Disposition: A | Discharge status: Home / Self Care | Payer: Self-pay | Source: Ambulatory Visit | Attending: Interventional Cardiology | Admitting: Interventional Cardiology

## 2012-09-20 DIAGNOSIS — Z5189 Encounter for other specified aftercare: Secondary | ICD-10-CM | POA: Insufficient documentation

## 2012-09-20 DIAGNOSIS — I2510 Atherosclerotic heart disease of native coronary artery without angina pectoris: Secondary | ICD-10-CM | POA: Insufficient documentation

## 2012-09-20 DIAGNOSIS — I4891 Unspecified atrial fibrillation: Secondary | ICD-10-CM | POA: Insufficient documentation

## 2012-09-20 DIAGNOSIS — E785 Hyperlipidemia, unspecified: Secondary | ICD-10-CM | POA: Insufficient documentation

## 2012-09-20 DIAGNOSIS — I4892 Unspecified atrial flutter: Secondary | ICD-10-CM | POA: Insufficient documentation

## 2012-09-20 DIAGNOSIS — Z951 Presence of aortocoronary bypass graft: Secondary | ICD-10-CM | POA: Insufficient documentation

## 2012-09-20 DIAGNOSIS — I10 Essential (primary) hypertension: Secondary | ICD-10-CM | POA: Insufficient documentation

## 2012-09-21 ENCOUNTER — Encounter (HOSPITAL_COMMUNITY): Payer: Self-pay

## 2012-09-22 ENCOUNTER — Encounter (HOSPITAL_COMMUNITY)
Admission: RE | Admit: 2012-09-22 | Discharge: 2012-09-22 | Disposition: A | Discharge status: Home / Self Care | Payer: Self-pay | Source: Ambulatory Visit | Attending: Interventional Cardiology | Admitting: Interventional Cardiology

## 2012-09-27 ENCOUNTER — Encounter (HOSPITAL_COMMUNITY)
Admission: RE | Admit: 2012-09-27 | Discharge: 2012-09-27 | Disposition: A | Discharge status: Home / Self Care | Payer: Self-pay | Source: Ambulatory Visit | Attending: Interventional Cardiology | Admitting: Interventional Cardiology

## 2012-09-28 ENCOUNTER — Encounter (HOSPITAL_COMMUNITY)
Admission: RE | Admit: 2012-09-28 | Discharge: 2012-09-28 | Disposition: A | Discharge status: Home / Self Care | Payer: Self-pay | Source: Ambulatory Visit | Attending: Interventional Cardiology | Admitting: Interventional Cardiology

## 2012-09-29 ENCOUNTER — Encounter (HOSPITAL_COMMUNITY): Payer: Self-pay

## 2012-10-04 ENCOUNTER — Encounter (HOSPITAL_COMMUNITY): Payer: Self-pay

## 2012-10-05 ENCOUNTER — Encounter (HOSPITAL_COMMUNITY): Payer: Self-pay

## 2012-10-06 ENCOUNTER — Encounter (HOSPITAL_COMMUNITY)
Admission: RE | Admit: 2012-10-06 | Discharge: 2012-10-06 | Disposition: A | Discharge status: Home / Self Care | Payer: Self-pay | Source: Ambulatory Visit | Attending: Interventional Cardiology | Admitting: Interventional Cardiology

## 2012-10-11 ENCOUNTER — Encounter (HOSPITAL_COMMUNITY)
Admission: RE | Admit: 2012-10-11 | Discharge: 2012-10-11 | Disposition: A | Discharge status: Home / Self Care | Payer: Self-pay | Source: Ambulatory Visit | Attending: Interventional Cardiology | Admitting: Interventional Cardiology

## 2012-10-12 ENCOUNTER — Encounter (HOSPITAL_COMMUNITY)
Admission: RE | Admit: 2012-10-12 | Discharge: 2012-10-12 | Disposition: A | Discharge status: Home / Self Care | Payer: Self-pay | Source: Ambulatory Visit | Attending: Interventional Cardiology | Admitting: Interventional Cardiology

## 2012-10-13 ENCOUNTER — Encounter (HOSPITAL_COMMUNITY)
Admission: RE | Admit: 2012-10-13 | Discharge: 2012-10-13 | Disposition: A | Discharge status: Home / Self Care | Payer: Self-pay | Source: Ambulatory Visit | Attending: Interventional Cardiology | Admitting: Interventional Cardiology

## 2012-10-18 ENCOUNTER — Encounter (HOSPITAL_COMMUNITY)
Admission: RE | Admit: 2012-10-18 | Discharge: 2012-10-18 | Disposition: A | Discharge status: Home / Self Care | Payer: Self-pay | Source: Ambulatory Visit | Attending: Interventional Cardiology | Admitting: Interventional Cardiology

## 2012-10-18 DIAGNOSIS — I4891 Unspecified atrial fibrillation: Secondary | ICD-10-CM | POA: Insufficient documentation

## 2012-10-18 DIAGNOSIS — I2510 Atherosclerotic heart disease of native coronary artery without angina pectoris: Secondary | ICD-10-CM | POA: Insufficient documentation

## 2012-10-18 DIAGNOSIS — Z951 Presence of aortocoronary bypass graft: Secondary | ICD-10-CM | POA: Insufficient documentation

## 2012-10-18 DIAGNOSIS — E785 Hyperlipidemia, unspecified: Secondary | ICD-10-CM | POA: Insufficient documentation

## 2012-10-18 DIAGNOSIS — Z5189 Encounter for other specified aftercare: Secondary | ICD-10-CM | POA: Insufficient documentation

## 2012-10-18 DIAGNOSIS — I10 Essential (primary) hypertension: Secondary | ICD-10-CM | POA: Insufficient documentation

## 2012-10-18 DIAGNOSIS — I4892 Unspecified atrial flutter: Secondary | ICD-10-CM | POA: Insufficient documentation

## 2012-10-19 ENCOUNTER — Encounter (HOSPITAL_COMMUNITY)
Admission: RE | Admit: 2012-10-19 | Discharge: 2012-10-19 | Disposition: A | Discharge status: Home / Self Care | Payer: Self-pay | Source: Ambulatory Visit | Attending: Interventional Cardiology | Admitting: Interventional Cardiology

## 2012-10-20 ENCOUNTER — Encounter (HOSPITAL_COMMUNITY)
Admission: RE | Admit: 2012-10-20 | Discharge: 2012-10-20 | Disposition: A | Discharge status: Home / Self Care | Payer: Self-pay | Source: Ambulatory Visit | Attending: Interventional Cardiology | Admitting: Interventional Cardiology

## 2012-10-25 ENCOUNTER — Encounter (HOSPITAL_COMMUNITY)
Admission: RE | Admit: 2012-10-25 | Discharge: 2012-10-25 | Disposition: A | Discharge status: Home / Self Care | Payer: Self-pay | Source: Ambulatory Visit | Attending: Interventional Cardiology | Admitting: Interventional Cardiology

## 2012-10-26 ENCOUNTER — Encounter (HOSPITAL_COMMUNITY)
Admission: RE | Admit: 2012-10-26 | Discharge: 2012-10-26 | Disposition: A | Discharge status: Home / Self Care | Payer: Self-pay | Source: Ambulatory Visit | Attending: Interventional Cardiology | Admitting: Interventional Cardiology

## 2012-10-27 ENCOUNTER — Encounter (HOSPITAL_COMMUNITY)
Admission: RE | Admit: 2012-10-27 | Discharge: 2012-10-27 | Disposition: A | Discharge status: Home / Self Care | Payer: Self-pay | Source: Ambulatory Visit | Attending: Interventional Cardiology | Admitting: Interventional Cardiology

## 2012-11-01 ENCOUNTER — Encounter (HOSPITAL_COMMUNITY): Payer: Self-pay

## 2012-11-02 ENCOUNTER — Encounter (HOSPITAL_COMMUNITY): Payer: Self-pay

## 2012-11-03 ENCOUNTER — Encounter (HOSPITAL_COMMUNITY): Payer: Self-pay

## 2012-11-08 ENCOUNTER — Encounter (HOSPITAL_COMMUNITY): Payer: Self-pay

## 2012-11-09 ENCOUNTER — Encounter (HOSPITAL_COMMUNITY)
Admission: RE | Admit: 2012-11-09 | Discharge: 2012-11-09 | Disposition: A | Discharge status: Home / Self Care | Payer: Self-pay | Source: Ambulatory Visit | Attending: Interventional Cardiology | Admitting: Interventional Cardiology

## 2012-11-10 ENCOUNTER — Encounter (HOSPITAL_COMMUNITY)
Admission: RE | Admit: 2012-11-10 | Discharge: 2012-11-10 | Disposition: A | Discharge status: Home / Self Care | Payer: Self-pay | Source: Ambulatory Visit | Attending: Interventional Cardiology | Admitting: Interventional Cardiology

## 2012-11-15 ENCOUNTER — Encounter (HOSPITAL_COMMUNITY)
Admission: RE | Admit: 2012-11-15 | Discharge: 2012-11-15 | Disposition: A | Discharge status: Home / Self Care | Payer: Self-pay | Source: Ambulatory Visit | Attending: Interventional Cardiology | Admitting: Interventional Cardiology

## 2012-11-16 ENCOUNTER — Encounter (HOSPITAL_COMMUNITY)
Admission: RE | Admit: 2012-11-16 | Discharge: 2012-11-16 | Disposition: A | Discharge status: Home / Self Care | Payer: Self-pay | Source: Ambulatory Visit | Attending: Interventional Cardiology | Admitting: Interventional Cardiology

## 2012-11-17 ENCOUNTER — Encounter (HOSPITAL_COMMUNITY)
Admission: RE | Admit: 2012-11-17 | Discharge: 2012-11-17 | Disposition: A | Discharge status: Home / Self Care | Payer: Self-pay | Source: Ambulatory Visit | Attending: Interventional Cardiology | Admitting: Interventional Cardiology

## 2012-11-22 ENCOUNTER — Encounter (HOSPITAL_COMMUNITY): Payer: Self-pay

## 2012-11-22 DIAGNOSIS — I10 Essential (primary) hypertension: Secondary | ICD-10-CM | POA: Insufficient documentation

## 2012-11-22 DIAGNOSIS — Z5189 Encounter for other specified aftercare: Secondary | ICD-10-CM | POA: Insufficient documentation

## 2012-11-22 DIAGNOSIS — Z951 Presence of aortocoronary bypass graft: Secondary | ICD-10-CM | POA: Insufficient documentation

## 2012-11-22 DIAGNOSIS — I4891 Unspecified atrial fibrillation: Secondary | ICD-10-CM | POA: Insufficient documentation

## 2012-11-22 DIAGNOSIS — E785 Hyperlipidemia, unspecified: Secondary | ICD-10-CM | POA: Insufficient documentation

## 2012-11-22 DIAGNOSIS — I4892 Unspecified atrial flutter: Secondary | ICD-10-CM | POA: Insufficient documentation

## 2012-11-22 DIAGNOSIS — I2510 Atherosclerotic heart disease of native coronary artery without angina pectoris: Secondary | ICD-10-CM | POA: Insufficient documentation

## 2012-11-23 ENCOUNTER — Encounter (HOSPITAL_COMMUNITY): Payer: Self-pay

## 2012-11-24 ENCOUNTER — Encounter (HOSPITAL_COMMUNITY): Payer: Self-pay

## 2012-11-29 ENCOUNTER — Encounter (HOSPITAL_COMMUNITY): Payer: Self-pay

## 2012-11-30 ENCOUNTER — Encounter (HOSPITAL_COMMUNITY): Payer: Self-pay

## 2012-12-01 ENCOUNTER — Encounter (HOSPITAL_COMMUNITY): Payer: Self-pay

## 2012-12-06 ENCOUNTER — Encounter (HOSPITAL_COMMUNITY): Payer: Self-pay

## 2012-12-07 ENCOUNTER — Encounter (HOSPITAL_COMMUNITY)
Admission: RE | Admit: 2012-12-07 | Discharge: 2012-12-07 | Disposition: A | Discharge status: Home / Self Care | Payer: Self-pay | Source: Ambulatory Visit | Attending: Interventional Cardiology | Admitting: Interventional Cardiology

## 2012-12-08 ENCOUNTER — Encounter (HOSPITAL_COMMUNITY)
Admission: RE | Admit: 2012-12-08 | Discharge: 2012-12-08 | Disposition: A | Discharge status: Home / Self Care | Payer: Self-pay | Source: Ambulatory Visit | Attending: Interventional Cardiology | Admitting: Interventional Cardiology

## 2012-12-13 ENCOUNTER — Encounter (HOSPITAL_COMMUNITY)
Admission: RE | Admit: 2012-12-13 | Discharge: 2012-12-13 | Disposition: A | Discharge status: Home / Self Care | Payer: Self-pay | Source: Ambulatory Visit | Attending: Interventional Cardiology | Admitting: Interventional Cardiology

## 2012-12-14 ENCOUNTER — Encounter (HOSPITAL_COMMUNITY)
Admission: RE | Admit: 2012-12-14 | Discharge: 2012-12-14 | Disposition: A | Discharge status: Home / Self Care | Payer: Self-pay | Source: Ambulatory Visit | Attending: Interventional Cardiology | Admitting: Interventional Cardiology

## 2012-12-15 ENCOUNTER — Encounter (HOSPITAL_COMMUNITY)
Admission: RE | Admit: 2012-12-15 | Discharge: 2012-12-15 | Disposition: A | Discharge status: Home / Self Care | Payer: Self-pay | Source: Ambulatory Visit | Attending: Interventional Cardiology | Admitting: Interventional Cardiology

## 2012-12-20 ENCOUNTER — Encounter (HOSPITAL_COMMUNITY): Payer: Medicare Other

## 2012-12-20 DIAGNOSIS — Z5189 Encounter for other specified aftercare: Secondary | ICD-10-CM | POA: Insufficient documentation

## 2012-12-20 DIAGNOSIS — I2510 Atherosclerotic heart disease of native coronary artery without angina pectoris: Secondary | ICD-10-CM | POA: Insufficient documentation

## 2012-12-20 DIAGNOSIS — I10 Essential (primary) hypertension: Secondary | ICD-10-CM | POA: Insufficient documentation

## 2012-12-20 DIAGNOSIS — Z951 Presence of aortocoronary bypass graft: Secondary | ICD-10-CM | POA: Insufficient documentation

## 2012-12-20 DIAGNOSIS — I4891 Unspecified atrial fibrillation: Secondary | ICD-10-CM | POA: Insufficient documentation

## 2012-12-20 DIAGNOSIS — I4892 Unspecified atrial flutter: Secondary | ICD-10-CM | POA: Insufficient documentation

## 2012-12-20 DIAGNOSIS — E785 Hyperlipidemia, unspecified: Secondary | ICD-10-CM | POA: Insufficient documentation

## 2012-12-21 ENCOUNTER — Encounter (HOSPITAL_COMMUNITY)
Admission: RE | Admit: 2012-12-21 | Discharge: 2012-12-21 | Disposition: A | Discharge status: Home / Self Care | Payer: Medicare Other | Source: Ambulatory Visit | Attending: Interventional Cardiology | Admitting: Interventional Cardiology

## 2012-12-22 ENCOUNTER — Encounter (HOSPITAL_COMMUNITY)
Admission: RE | Admit: 2012-12-22 | Discharge: 2012-12-22 | Disposition: A | Discharge status: Home / Self Care | Payer: Self-pay | Source: Ambulatory Visit | Attending: Interventional Cardiology | Admitting: Interventional Cardiology

## 2012-12-27 ENCOUNTER — Encounter (HOSPITAL_COMMUNITY)
Admission: RE | Admit: 2012-12-27 | Discharge: 2012-12-27 | Disposition: A | Discharge status: Home / Self Care | Payer: Self-pay | Source: Ambulatory Visit | Attending: Interventional Cardiology | Admitting: Interventional Cardiology

## 2012-12-28 ENCOUNTER — Encounter (HOSPITAL_COMMUNITY)
Admission: RE | Admit: 2012-12-28 | Discharge: 2012-12-28 | Disposition: A | Discharge status: Home / Self Care | Payer: Self-pay | Source: Ambulatory Visit | Attending: Interventional Cardiology | Admitting: Interventional Cardiology

## 2012-12-29 ENCOUNTER — Encounter (HOSPITAL_COMMUNITY)
Admission: RE | Admit: 2012-12-29 | Discharge: 2012-12-29 | Disposition: A | Discharge status: Home / Self Care | Payer: Self-pay | Source: Ambulatory Visit | Attending: Interventional Cardiology | Admitting: Interventional Cardiology

## 2013-01-03 ENCOUNTER — Encounter (HOSPITAL_COMMUNITY)
Admission: RE | Admit: 2013-01-03 | Discharge: 2013-01-03 | Disposition: A | Discharge status: Home / Self Care | Payer: Self-pay | Source: Ambulatory Visit | Attending: Interventional Cardiology | Admitting: Interventional Cardiology

## 2013-01-04 ENCOUNTER — Encounter (HOSPITAL_COMMUNITY)
Admission: RE | Admit: 2013-01-04 | Discharge: 2013-01-04 | Disposition: A | Discharge status: Home / Self Care | Payer: Self-pay | Source: Ambulatory Visit | Attending: Interventional Cardiology | Admitting: Interventional Cardiology

## 2013-01-05 ENCOUNTER — Encounter (HOSPITAL_COMMUNITY)
Admission: RE | Admit: 2013-01-05 | Discharge: 2013-01-05 | Disposition: A | Discharge status: Home / Self Care | Payer: Self-pay | Source: Ambulatory Visit | Attending: Interventional Cardiology | Admitting: Interventional Cardiology

## 2013-01-10 ENCOUNTER — Encounter (HOSPITAL_COMMUNITY)
Admission: RE | Admit: 2013-01-10 | Discharge: 2013-01-10 | Disposition: A | Discharge status: Home / Self Care | Payer: Self-pay | Source: Ambulatory Visit | Attending: Interventional Cardiology | Admitting: Interventional Cardiology

## 2013-01-11 ENCOUNTER — Encounter (HOSPITAL_COMMUNITY)
Admission: RE | Admit: 2013-01-11 | Discharge: 2013-01-11 | Disposition: A | Discharge status: Home / Self Care | Payer: Self-pay | Source: Ambulatory Visit | Attending: Interventional Cardiology | Admitting: Interventional Cardiology

## 2013-01-12 ENCOUNTER — Encounter (HOSPITAL_COMMUNITY)
Admission: RE | Admit: 2013-01-12 | Discharge: 2013-01-12 | Disposition: A | Discharge status: Home / Self Care | Payer: Self-pay | Source: Ambulatory Visit | Attending: Interventional Cardiology | Admitting: Interventional Cardiology

## 2013-01-17 ENCOUNTER — Encounter (HOSPITAL_COMMUNITY)
Admission: RE | Admit: 2013-01-17 | Discharge: 2013-01-17 | Disposition: A | Discharge status: Home / Self Care | Payer: Self-pay | Source: Ambulatory Visit | Attending: Interventional Cardiology | Admitting: Interventional Cardiology

## 2013-01-18 ENCOUNTER — Encounter (HOSPITAL_COMMUNITY)
Admission: RE | Admit: 2013-01-18 | Discharge: 2013-01-18 | Disposition: A | Discharge status: Home / Self Care | Payer: Self-pay | Source: Ambulatory Visit | Attending: Interventional Cardiology | Admitting: Interventional Cardiology

## 2013-01-18 DIAGNOSIS — I4891 Unspecified atrial fibrillation: Secondary | ICD-10-CM | POA: Insufficient documentation

## 2013-01-18 DIAGNOSIS — E785 Hyperlipidemia, unspecified: Secondary | ICD-10-CM | POA: Insufficient documentation

## 2013-01-18 DIAGNOSIS — I4892 Unspecified atrial flutter: Secondary | ICD-10-CM | POA: Insufficient documentation

## 2013-01-18 DIAGNOSIS — I2510 Atherosclerotic heart disease of native coronary artery without angina pectoris: Secondary | ICD-10-CM | POA: Insufficient documentation

## 2013-01-18 DIAGNOSIS — Z5189 Encounter for other specified aftercare: Secondary | ICD-10-CM | POA: Insufficient documentation

## 2013-01-18 DIAGNOSIS — I10 Essential (primary) hypertension: Secondary | ICD-10-CM | POA: Insufficient documentation

## 2013-01-18 DIAGNOSIS — Z951 Presence of aortocoronary bypass graft: Secondary | ICD-10-CM | POA: Insufficient documentation

## 2013-01-19 ENCOUNTER — Encounter (HOSPITAL_COMMUNITY)
Admission: RE | Admit: 2013-01-19 | Discharge: 2013-01-19 | Disposition: A | Discharge status: Home / Self Care | Payer: Self-pay | Source: Ambulatory Visit | Attending: Interventional Cardiology | Admitting: Interventional Cardiology

## 2013-01-24 ENCOUNTER — Encounter (HOSPITAL_COMMUNITY)
Admission: RE | Admit: 2013-01-24 | Discharge: 2013-01-24 | Disposition: A | Discharge status: Home / Self Care | Payer: Self-pay | Source: Ambulatory Visit | Attending: Interventional Cardiology | Admitting: Interventional Cardiology

## 2013-01-25 ENCOUNTER — Encounter (HOSPITAL_COMMUNITY): Payer: Medicare Other

## 2013-01-25 ENCOUNTER — Other Ambulatory Visit: Payer: Self-pay | Admitting: Dermatology

## 2013-01-26 ENCOUNTER — Encounter (HOSPITAL_COMMUNITY): Payer: Medicare Other

## 2013-01-29 ENCOUNTER — Encounter: Payer: Self-pay | Admitting: *Deleted

## 2013-01-29 ENCOUNTER — Encounter: Payer: Self-pay | Admitting: Interventional Cardiology

## 2013-01-31 ENCOUNTER — Encounter (HOSPITAL_COMMUNITY)
Admission: RE | Admit: 2013-01-31 | Discharge: 2013-01-31 | Disposition: A | Discharge status: Home / Self Care | Payer: Self-pay | Source: Ambulatory Visit | Attending: Interventional Cardiology | Admitting: Interventional Cardiology

## 2013-02-01 ENCOUNTER — Encounter (HOSPITAL_COMMUNITY)
Admission: RE | Admit: 2013-02-01 | Discharge: 2013-02-01 | Disposition: A | Discharge status: Home / Self Care | Payer: Self-pay | Source: Ambulatory Visit | Attending: Interventional Cardiology | Admitting: Interventional Cardiology

## 2013-02-02 ENCOUNTER — Ambulatory Visit (INDEPENDENT_AMBULATORY_CARE_PROVIDER_SITE_OTHER): Payer: Medicare Other | Admitting: Interventional Cardiology

## 2013-02-02 ENCOUNTER — Encounter (HOSPITAL_COMMUNITY)
Admission: RE | Admit: 2013-02-02 | Discharge: 2013-02-02 | Disposition: A | Discharge status: Home / Self Care | Payer: Self-pay | Source: Ambulatory Visit | Attending: Interventional Cardiology | Admitting: Interventional Cardiology

## 2013-02-02 ENCOUNTER — Telehealth: Payer: Self-pay | Admitting: Cardiology

## 2013-02-02 ENCOUNTER — Encounter: Payer: Self-pay | Admitting: Interventional Cardiology

## 2013-02-02 VITALS — BP 160/72 | HR 62 | Ht 69.0 in | Wt 180.0 lb

## 2013-02-02 DIAGNOSIS — I359 Nonrheumatic aortic valve disorder, unspecified: Secondary | ICD-10-CM

## 2013-02-02 DIAGNOSIS — I739 Peripheral vascular disease, unspecified: Secondary | ICD-10-CM

## 2013-02-02 DIAGNOSIS — I2510 Atherosclerotic heart disease of native coronary artery without angina pectoris: Secondary | ICD-10-CM

## 2013-02-02 DIAGNOSIS — I4891 Unspecified atrial fibrillation: Secondary | ICD-10-CM

## 2013-02-02 DIAGNOSIS — I10 Essential (primary) hypertension: Secondary | ICD-10-CM

## 2013-02-02 MED ORDER — METOLAZONE 2.5 MG PO TABS: ORAL_TABLET | ORAL | Status: DC

## 2013-02-02 NOTE — Telephone Encounter (Signed)
Pt is having Skin Surgery on 02/15/13. Can pt cease coumadin prior to surgery and if so how many days?

## 2013-02-02 NOTE — Patient Instructions (Signed)
Your physician wants you to follow-up in: 6 month follow up. You will receive a reminder letter in the mail two months in advance. If you don't receive a letter, please call our office to schedule the follow-up appointment.  Your physician recommends that you continue on your current medications as directed. Please refer to the Current Medication list given to you today.

## 2013-02-02 NOTE — Progress Notes (Signed)
Patient ID: Terisa Starr, male   DOB: 1930/07/02, 79 y.o.   MRN: [id number]    [address] [place], [place]  [address] Phone: [phone] Fax:  [phone]  Date:  02/02/2013   ID:  [name], DOB 05-13-1930, MRN [id number]  PCP:  Darnelle Bos, MD      History of Present Illness: [name] is a 79 y.o. male who had TAVR done in 2013. His breathing is better. No chest pain with walking. His legs get heavy with walking and balance is off. He is doing cardiac rehab. He is using an inhaler. MRI showed spinal stenosis. He has had gout as well in his feet. He is now treated for OSA.  Using Metolazone when he feels fluid overload.  He weighs himself and fluid overload corresponds to an increase in weight by 3 lbs.  CAD/ASCVD:  Exercises at cardiac rehab 3x/week. No chest pain or SHOB. Denies : Chest pain.  Dyspnea on exertion.  Leg edema.  No problems with using exercise bike.    Wt Readings from Last 3 Encounters:  02/02/13 180 lb (81.647 kg)     Past Medical History  Diagnosis Date  . Aortic valve disorder   . PVD (peripheral vascular disease)   . Hypercholesteremia   . HTN (hypertension)   . Atrial fibrillation   . Atrial flutter   . CKD (chronic kidney disease)   . AAA (abdominal aortic aneurysm)   . 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 CAD (coronary artery disease)   . Bladder cancer   . Gallstone   . Vitamin B12 deficiency   . Colon cancer   . Carotid stenosis     Current Outpatient Prescriptions  Medication Sig Dispense Refill  . atorvastatin (LIPITOR) 40 MG tablet Take 40 mg by mouth Daily.      . citric acid-sodium citrate (ORACIT) 334-500 MG/5ML solution Take 15 mLs by mouth once. 1 Teaspoon Daily      . COUMADIN 5 MG tablet Take 5 mg by mouth as directed.      . furosemide (LASIX) 40 MG tablet Take 80 mg by mouth Daily.      . metoprolol tartrate (LOPRESSOR) 25 MG tablet  Take 50 mg by mouth Twice daily.       No current facility-administered medications for this visit.    Allergies:    Allergies  Allergen Reactions  . Ramipril     Social History:  The patient  reports that he has quit smoking. He does not have any smokeless tobacco history on file. He reports that he does not drink alcohol or use illicit drugs.   Family History:  The patient's family history includes Cancer in his mother.   ROS:  Please see the history of present illness.  No nausea, vomiting.  No fevers, chills.  No focal weakness.  No dysuria. Mild unsteadiness, uses cane   All other systems reviewed and negative.   PHYSICAL EXAM: VS:  BP 160/72  Pulse 62  Ht 5\' 9"  (1.753 m)  Wt 180 lb (81.647 kg)  BMI 26.57 kg/m2 Well nourished, well developed, in no acute distress HEENT: normal Neck: no JVD, no carotid bruits Cardiac:  normal S1, S2; RRR; 2/6 systolic murmur Lungs:  clear to auscultation bilaterally, no wheezing, rhonchi or rales Abd: soft, nontender, no hepatomegaly Ext: no edema Skin: warm and dry Neuro:   no focal abnormalities noted  EKG:  A. fib, rate controlled, right bundle branch block     ASSESSMENT AND PLAN:  Aortic valve disorders  Notes: status post TAVR at [facility] He will followup there in a few weeks. Some diastolic dysfunction as well.   2. Peripheral Vascular Disease  Notes: he has some symptoms of claudication but also has symptoms of nonvascular leg pain. He also has issues with spinal stenosis and arthritis. Given his renal insufficiency, I don't think it's worthwhile trying to revascularize his lower extremity vasculature. I think he would likely still have discomfort since much of his pain sounds nonvascular.  3. Essential hypertension, benign  Continue Metoprolol Tartrate Tablet, 25 MG, 1 tablet, Orally, Twice a day Notes: controlled.  4. Coronary atherosclerosis of native coronary artery  Notes: no angina. Status post diagonal stent at the time of  TAVR.  5. Others  Notes: I encouraged him to use his cane to avoid loss of balance and falling.    Signed, Fredric Mare, MD, [facility] 02/02/2013 9:28 AM

## 2013-02-02 NOTE — Telephone Encounter (Signed)
Patient will have a moh's procedure on his ear.  He did this in past and held warfarin for 2 days prior without complication.  Spoke with patient's wife and notified her to have him hold 2 days prior and to just let me know what date the procedure will be once he knows.  To Dr. Eldridge Dace as fyi.

## 2013-02-03 ENCOUNTER — Encounter: Payer: Self-pay | Admitting: Interventional Cardiology

## 2013-02-03 DIAGNOSIS — I482 Chronic atrial fibrillation, unspecified: Secondary | ICD-10-CM | POA: Insufficient documentation

## 2013-02-03 DIAGNOSIS — I2510 Atherosclerotic heart disease of native coronary artery without angina pectoris: Secondary | ICD-10-CM | POA: Insufficient documentation

## 2013-02-03 DIAGNOSIS — I739 Peripheral vascular disease, unspecified: Secondary | ICD-10-CM | POA: Insufficient documentation

## 2013-02-03 DIAGNOSIS — I6529 Occlusion and stenosis of unspecified carotid artery: Secondary | ICD-10-CM | POA: Insufficient documentation

## 2013-02-03 DIAGNOSIS — I714 Abdominal aortic aneurysm, without rupture: Secondary | ICD-10-CM | POA: Insufficient documentation

## 2013-02-03 DIAGNOSIS — Z8551 Personal history of malignant neoplasm of bladder: Secondary | ICD-10-CM | POA: Insufficient documentation

## 2013-02-03 DIAGNOSIS — I10 Essential (primary) hypertension: Secondary | ICD-10-CM | POA: Insufficient documentation

## 2013-02-03 DIAGNOSIS — I359 Nonrheumatic aortic valve disorder, unspecified: Secondary | ICD-10-CM | POA: Insufficient documentation

## 2013-02-07 ENCOUNTER — Encounter (HOSPITAL_COMMUNITY)
Admission: RE | Admit: 2013-02-07 | Discharge: 2013-02-07 | Disposition: A | Discharge status: Home / Self Care | Payer: Self-pay | Source: Ambulatory Visit | Attending: Interventional Cardiology | Admitting: Interventional Cardiology

## 2013-02-08 ENCOUNTER — Encounter (HOSPITAL_COMMUNITY)
Admission: RE | Admit: 2013-02-08 | Discharge: 2013-02-08 | Disposition: A | Discharge status: Home / Self Care | Payer: Self-pay | Source: Ambulatory Visit | Attending: Interventional Cardiology | Admitting: Interventional Cardiology

## 2013-02-09 ENCOUNTER — Encounter (HOSPITAL_COMMUNITY)
Admission: RE | Admit: 2013-02-09 | Discharge: 2013-02-09 | Disposition: A | Discharge status: Home / Self Care | Payer: Self-pay | Source: Ambulatory Visit | Attending: Interventional Cardiology | Admitting: Interventional Cardiology

## 2013-02-14 ENCOUNTER — Encounter (HOSPITAL_COMMUNITY)
Admission: RE | Admit: 2013-02-14 | Discharge: 2013-02-14 | Disposition: A | Discharge status: Home / Self Care | Payer: Self-pay | Source: Ambulatory Visit | Attending: Interventional Cardiology | Admitting: Interventional Cardiology

## 2013-02-15 ENCOUNTER — Encounter (HOSPITAL_COMMUNITY): Payer: Medicare Other

## 2013-02-16 ENCOUNTER — Encounter (HOSPITAL_COMMUNITY): Payer: Medicare Other

## 2013-02-21 ENCOUNTER — Encounter (HOSPITAL_COMMUNITY): Payer: Self-pay | Attending: Interventional Cardiology

## 2013-02-21 DIAGNOSIS — Z5189 Encounter for other specified aftercare: Secondary | ICD-10-CM | POA: Insufficient documentation

## 2013-02-21 DIAGNOSIS — I2510 Atherosclerotic heart disease of native coronary artery without angina pectoris: Secondary | ICD-10-CM | POA: Insufficient documentation

## 2013-02-21 DIAGNOSIS — Z951 Presence of aortocoronary bypass graft: Secondary | ICD-10-CM | POA: Insufficient documentation

## 2013-02-21 DIAGNOSIS — I4892 Unspecified atrial flutter: Secondary | ICD-10-CM | POA: Insufficient documentation

## 2013-02-21 DIAGNOSIS — I4891 Unspecified atrial fibrillation: Secondary | ICD-10-CM | POA: Insufficient documentation

## 2013-02-22 ENCOUNTER — Ambulatory Visit (INDEPENDENT_AMBULATORY_CARE_PROVIDER_SITE_OTHER): Payer: Medicare Other | Admitting: Pharmacist

## 2013-02-22 ENCOUNTER — Encounter (HOSPITAL_COMMUNITY): Payer: Medicare Other

## 2013-02-22 DIAGNOSIS — I4891 Unspecified atrial fibrillation: Secondary | ICD-10-CM

## 2013-02-22 LAB — POCT INR: INR: 1.8

## 2013-02-23 ENCOUNTER — Encounter (HOSPITAL_COMMUNITY): Payer: Medicare Other

## 2013-02-28 ENCOUNTER — Encounter (HOSPITAL_COMMUNITY): Payer: Medicare Other

## 2013-03-01 ENCOUNTER — Encounter (HOSPITAL_COMMUNITY): Payer: Medicare Other

## 2013-03-02 ENCOUNTER — Encounter (HOSPITAL_COMMUNITY): Payer: Medicare Other

## 2013-03-07 ENCOUNTER — Encounter (HOSPITAL_COMMUNITY): Payer: Medicare Other

## 2013-03-07 ENCOUNTER — Telehealth: Payer: Self-pay | Admitting: Cardiology

## 2013-03-07 MED ORDER — METOPROLOL TARTRATE 25 MG PO TABS: 25.0000 mg | ORAL_TABLET | Freq: Two times a day (BID) | ORAL | Status: DC

## 2013-03-07 MED ORDER — FUROSEMIDE 40 MG PO TABS: 80.0000 mg | ORAL_TABLET | Freq: Every day | ORAL | Status: DC

## 2013-03-07 NOTE — Telephone Encounter (Signed)
Message copied by Theda Sers on Tue Mar 07, 2013  8:43 AM ------      Message from: SMART, Gaspar Skeeters      Created: Tue Mar 07, 2013  8:31 AM      Regarding: refills       Patient stated [facility] has notified us twice for refills on his Metoprolol tartrate and furosemide 40 mg.  If you haven't already addressed these, can you please send refills for these meds to [facility].  Thanks. ------

## 2013-03-07 NOTE — Telephone Encounter (Signed)
Refilled and pt aware °

## 2013-03-08 ENCOUNTER — Encounter (HOSPITAL_COMMUNITY): Payer: Medicare Other

## 2013-03-09 ENCOUNTER — Encounter (HOSPITAL_COMMUNITY): Payer: Medicare Other

## 2013-03-14 ENCOUNTER — Encounter (HOSPITAL_COMMUNITY): Payer: Medicare Other

## 2013-03-15 ENCOUNTER — Encounter (HOSPITAL_COMMUNITY): Payer: Medicare Other

## 2013-03-21 ENCOUNTER — Encounter (HOSPITAL_COMMUNITY)
Admission: RE | Admit: 2013-03-21 | Discharge: 2013-03-21 | Disposition: A | Discharge status: Home / Self Care | Payer: Self-pay | Source: Ambulatory Visit | Attending: Interventional Cardiology | Admitting: Interventional Cardiology

## 2013-03-21 DIAGNOSIS — I4891 Unspecified atrial fibrillation: Secondary | ICD-10-CM | POA: Insufficient documentation

## 2013-03-21 DIAGNOSIS — Z5189 Encounter for other specified aftercare: Secondary | ICD-10-CM | POA: Insufficient documentation

## 2013-03-21 DIAGNOSIS — Z951 Presence of aortocoronary bypass graft: Secondary | ICD-10-CM | POA: Insufficient documentation

## 2013-03-21 DIAGNOSIS — I4892 Unspecified atrial flutter: Secondary | ICD-10-CM | POA: Insufficient documentation

## 2013-03-21 DIAGNOSIS — I2510 Atherosclerotic heart disease of native coronary artery without angina pectoris: Secondary | ICD-10-CM | POA: Insufficient documentation

## 2013-03-22 ENCOUNTER — Encounter (HOSPITAL_COMMUNITY)
Admission: RE | Admit: 2013-03-22 | Discharge: 2013-03-22 | Disposition: A | Discharge status: Home / Self Care | Payer: Self-pay | Source: Ambulatory Visit | Attending: Interventional Cardiology | Admitting: Interventional Cardiology

## 2013-03-22 DIAGNOSIS — I4891 Unspecified atrial fibrillation: Secondary | ICD-10-CM | POA: Insufficient documentation

## 2013-03-22 DIAGNOSIS — E785 Hyperlipidemia, unspecified: Secondary | ICD-10-CM | POA: Insufficient documentation

## 2013-03-22 DIAGNOSIS — I4892 Unspecified atrial flutter: Secondary | ICD-10-CM | POA: Insufficient documentation

## 2013-03-22 DIAGNOSIS — Z5189 Encounter for other specified aftercare: Secondary | ICD-10-CM | POA: Insufficient documentation

## 2013-03-22 DIAGNOSIS — I2510 Atherosclerotic heart disease of native coronary artery without angina pectoris: Secondary | ICD-10-CM | POA: Insufficient documentation

## 2013-03-22 DIAGNOSIS — Z951 Presence of aortocoronary bypass graft: Secondary | ICD-10-CM | POA: Insufficient documentation

## 2013-03-22 DIAGNOSIS — I10 Essential (primary) hypertension: Secondary | ICD-10-CM | POA: Insufficient documentation

## 2013-03-23 ENCOUNTER — Encounter (HOSPITAL_COMMUNITY)
Admission: RE | Admit: 2013-03-23 | Discharge: 2013-03-23 | Disposition: A | Discharge status: Home / Self Care | Payer: Self-pay | Source: Ambulatory Visit | Attending: Interventional Cardiology | Admitting: Interventional Cardiology

## 2013-03-28 ENCOUNTER — Encounter (HOSPITAL_COMMUNITY)
Admission: RE | Admit: 2013-03-28 | Discharge: 2013-03-28 | Disposition: A | Discharge status: Home / Self Care | Payer: Self-pay | Source: Ambulatory Visit | Attending: Interventional Cardiology | Admitting: Interventional Cardiology

## 2013-03-29 ENCOUNTER — Ambulatory Visit (INDEPENDENT_AMBULATORY_CARE_PROVIDER_SITE_OTHER): Payer: Medicare Other | Admitting: Pharmacist

## 2013-03-29 ENCOUNTER — Encounter (HOSPITAL_COMMUNITY)
Admission: RE | Admit: 2013-03-29 | Discharge: 2013-03-29 | Disposition: A | Discharge status: Home / Self Care | Payer: Self-pay | Source: Ambulatory Visit | Attending: Interventional Cardiology | Admitting: Interventional Cardiology

## 2013-03-29 DIAGNOSIS — I4891 Unspecified atrial fibrillation: Secondary | ICD-10-CM

## 2013-03-30 ENCOUNTER — Encounter (HOSPITAL_COMMUNITY)
Admission: RE | Admit: 2013-03-30 | Discharge: 2013-03-30 | Disposition: A | Discharge status: Home / Self Care | Payer: Self-pay | Source: Ambulatory Visit | Attending: Interventional Cardiology | Admitting: Interventional Cardiology

## 2013-04-04 ENCOUNTER — Encounter (HOSPITAL_COMMUNITY)
Admission: RE | Admit: 2013-04-04 | Discharge: 2013-04-04 | Disposition: A | Discharge status: Home / Self Care | Payer: Self-pay | Source: Ambulatory Visit | Attending: Interventional Cardiology | Admitting: Interventional Cardiology

## 2013-04-05 ENCOUNTER — Encounter (HOSPITAL_COMMUNITY)
Admission: RE | Admit: 2013-04-05 | Discharge: 2013-04-05 | Disposition: A | Discharge status: Home / Self Care | Payer: Self-pay | Source: Ambulatory Visit | Attending: Interventional Cardiology | Admitting: Interventional Cardiology

## 2013-04-06 ENCOUNTER — Encounter (HOSPITAL_COMMUNITY): Payer: Medicare Other

## 2013-04-11 ENCOUNTER — Encounter (HOSPITAL_COMMUNITY)
Admission: RE | Admit: 2013-04-11 | Discharge: 2013-04-11 | Disposition: A | Discharge status: Home / Self Care | Payer: Self-pay | Source: Ambulatory Visit | Attending: Interventional Cardiology | Admitting: Interventional Cardiology

## 2013-04-12 ENCOUNTER — Encounter (HOSPITAL_COMMUNITY)
Admission: RE | Admit: 2013-04-12 | Discharge: 2013-04-12 | Disposition: A | Discharge status: Home / Self Care | Payer: Medicare Other | Source: Ambulatory Visit | Attending: Interventional Cardiology | Admitting: Interventional Cardiology

## 2013-04-18 ENCOUNTER — Encounter (HOSPITAL_COMMUNITY)
Admission: RE | Admit: 2013-04-18 | Discharge: 2013-04-18 | Disposition: A | Discharge status: Home / Self Care | Payer: Self-pay | Source: Ambulatory Visit | Attending: Interventional Cardiology | Admitting: Interventional Cardiology

## 2013-04-19 ENCOUNTER — Encounter (HOSPITAL_COMMUNITY): Payer: Medicare Other

## 2013-04-25 ENCOUNTER — Encounter (HOSPITAL_COMMUNITY)
Admission: RE | Admit: 2013-04-25 | Discharge: 2013-04-25 | Disposition: A | Discharge status: Home / Self Care | Payer: Self-pay | Source: Ambulatory Visit | Attending: Interventional Cardiology | Admitting: Interventional Cardiology

## 2013-04-25 DIAGNOSIS — Z951 Presence of aortocoronary bypass graft: Secondary | ICD-10-CM | POA: Insufficient documentation

## 2013-04-25 DIAGNOSIS — I4891 Unspecified atrial fibrillation: Secondary | ICD-10-CM | POA: Insufficient documentation

## 2013-04-25 DIAGNOSIS — I2510 Atherosclerotic heart disease of native coronary artery without angina pectoris: Secondary | ICD-10-CM | POA: Insufficient documentation

## 2013-04-25 DIAGNOSIS — I4892 Unspecified atrial flutter: Secondary | ICD-10-CM | POA: Insufficient documentation

## 2013-04-25 DIAGNOSIS — Z5189 Encounter for other specified aftercare: Secondary | ICD-10-CM | POA: Insufficient documentation

## 2013-04-26 ENCOUNTER — Encounter (HOSPITAL_COMMUNITY)
Admission: RE | Admit: 2013-04-26 | Discharge: 2013-04-26 | Disposition: A | Discharge status: Home / Self Care | Payer: Self-pay | Source: Ambulatory Visit | Attending: Interventional Cardiology | Admitting: Interventional Cardiology

## 2013-04-26 ENCOUNTER — Ambulatory Visit (INDEPENDENT_AMBULATORY_CARE_PROVIDER_SITE_OTHER): Payer: Medicare Other | Admitting: *Deleted

## 2013-04-26 DIAGNOSIS — I4891 Unspecified atrial fibrillation: Secondary | ICD-10-CM

## 2013-04-26 LAB — POCT INR: INR: 2.9

## 2013-04-27 ENCOUNTER — Encounter (HOSPITAL_COMMUNITY): Payer: Medicare Other

## 2013-05-02 ENCOUNTER — Encounter (HOSPITAL_COMMUNITY): Payer: Medicare Other

## 2013-05-03 ENCOUNTER — Encounter: Payer: Self-pay | Admitting: Cardiovascular Disease

## 2013-05-03 ENCOUNTER — Encounter (HOSPITAL_COMMUNITY): Payer: Medicare Other

## 2013-05-03 ENCOUNTER — Ambulatory Visit (HOSPITAL_COMMUNITY): Payer: Medicare Other | Attending: Cardiovascular Disease

## 2013-05-03 DIAGNOSIS — I6529 Occlusion and stenosis of unspecified carotid artery: Secondary | ICD-10-CM

## 2013-05-03 DIAGNOSIS — I10 Essential (primary) hypertension: Secondary | ICD-10-CM | POA: Insufficient documentation

## 2013-05-03 DIAGNOSIS — I2510 Atherosclerotic heart disease of native coronary artery without angina pectoris: Secondary | ICD-10-CM | POA: Insufficient documentation

## 2013-05-03 DIAGNOSIS — I658 Occlusion and stenosis of other precerebral arteries: Secondary | ICD-10-CM | POA: Insufficient documentation

## 2013-05-04 ENCOUNTER — Encounter (HOSPITAL_COMMUNITY): Payer: Medicare Other

## 2013-05-09 ENCOUNTER — Encounter (HOSPITAL_COMMUNITY): Payer: Medicare Other

## 2013-05-10 ENCOUNTER — Encounter (HOSPITAL_COMMUNITY): Payer: Medicare Other

## 2013-05-11 ENCOUNTER — Encounter (HOSPITAL_COMMUNITY): Payer: Medicare Other

## 2013-05-16 ENCOUNTER — Encounter (HOSPITAL_COMMUNITY): Admission: RE | Admit: 2013-05-16 | Payer: Medicare Other | Source: Ambulatory Visit

## 2013-05-17 ENCOUNTER — Encounter (HOSPITAL_COMMUNITY): Payer: Medicare Other

## 2013-05-18 ENCOUNTER — Encounter (HOSPITAL_COMMUNITY): Payer: Medicare Other

## 2013-05-23 ENCOUNTER — Encounter (HOSPITAL_COMMUNITY): Payer: Medicare Other | Attending: Interventional Cardiology

## 2013-05-23 DIAGNOSIS — I4891 Unspecified atrial fibrillation: Secondary | ICD-10-CM | POA: Insufficient documentation

## 2013-05-23 DIAGNOSIS — I4892 Unspecified atrial flutter: Secondary | ICD-10-CM | POA: Insufficient documentation

## 2013-05-23 DIAGNOSIS — Z951 Presence of aortocoronary bypass graft: Secondary | ICD-10-CM | POA: Insufficient documentation

## 2013-05-23 DIAGNOSIS — I2510 Atherosclerotic heart disease of native coronary artery without angina pectoris: Secondary | ICD-10-CM | POA: Insufficient documentation

## 2013-05-23 DIAGNOSIS — Z5189 Encounter for other specified aftercare: Secondary | ICD-10-CM | POA: Insufficient documentation

## 2013-05-24 ENCOUNTER — Encounter (HOSPITAL_COMMUNITY): Payer: Medicare Other

## 2013-05-25 ENCOUNTER — Encounter (HOSPITAL_COMMUNITY): Payer: Medicare Other

## 2013-05-29 ENCOUNTER — Other Ambulatory Visit (INDEPENDENT_AMBULATORY_CARE_PROVIDER_SITE_OTHER): Payer: Medicare Other | Admitting: Neurology

## 2013-05-29 ENCOUNTER — Ambulatory Visit (INDEPENDENT_AMBULATORY_CARE_PROVIDER_SITE_OTHER): Payer: Self-pay

## 2013-05-29 DIAGNOSIS — Z0289 Encounter for other administrative examinations: Secondary | ICD-10-CM

## 2013-05-29 DIAGNOSIS — R209 Unspecified disturbances of skin sensation: Secondary | ICD-10-CM

## 2013-05-29 NOTE — Procedures (Signed)
     HISTORY:  [name] is an 80 year old gentleman with onset of sensory alteration in the feet associated with aching in the arch. The patient has pain that is worse with weightbearing. The problems began around 05/19/2013. The patient is being evaluated for possible neuropathy.  NERVE CONDUCTION STUDIES:  Nerve conduction studies were performed on both lower extremities. The distal motor latencies and motor amplitudes for the peroneal and posterior tibial nerves were within normal limits. The nerve conduction velocities for these nerves were also normal. The H reflex latencies were normal. The sensory latencies for the peroneal nerves were within normal limits.   EMG STUDIES:  EMG evaluation was not performed.  IMPRESSION:  Nerve conduction studies done on both lower extremities were unremarkable, without clear evidence of a peripheral neuropathy. A small fiber neuropathy however, may be missed by a standard nerve conduction studies and clinical correlation is required. An early peripheral neuropathy may be missed by nerve conduction studies as well.  Jill Alexanders MD 05/29/2013 4:02 PM  [facility] [address] [place], [place] [address]  Phone [phone] Fax [phone]

## 2013-05-30 ENCOUNTER — Encounter (HOSPITAL_COMMUNITY): Payer: Medicare Other

## 2013-05-31 ENCOUNTER — Encounter (HOSPITAL_COMMUNITY): Payer: Medicare Other

## 2013-06-01 ENCOUNTER — Ambulatory Visit (INDEPENDENT_AMBULATORY_CARE_PROVIDER_SITE_OTHER): Payer: Medicare Other | Admitting: *Deleted

## 2013-06-01 ENCOUNTER — Encounter (HOSPITAL_COMMUNITY): Payer: Medicare Other

## 2013-06-01 DIAGNOSIS — I4891 Unspecified atrial fibrillation: Secondary | ICD-10-CM

## 2013-06-01 LAB — POCT INR: INR: 2.8

## 2013-06-05 ENCOUNTER — Encounter: Payer: Self-pay | Admitting: Neurology

## 2013-06-05 ENCOUNTER — Ambulatory Visit (INDEPENDENT_AMBULATORY_CARE_PROVIDER_SITE_OTHER): Payer: Medicare Other | Admitting: Neurology

## 2013-06-05 VITALS — BP 140/70 | HR 80 | Ht 68.0 in | Wt 175.0 lb

## 2013-06-05 DIAGNOSIS — I6529 Occlusion and stenosis of unspecified carotid artery: Secondary | ICD-10-CM

## 2013-06-05 DIAGNOSIS — R6889 Other general symptoms and signs: Secondary | ICD-10-CM

## 2013-06-05 DIAGNOSIS — M79609 Pain in unspecified limb: Secondary | ICD-10-CM | POA: Insufficient documentation

## 2013-06-05 NOTE — Progress Notes (Signed)
Reason for visit: Foot pain  [name] is an 80 y.o. male  History of present illness:  [name] is an 80 year old right-handed white male with a history of bilateral foot pain he claims been present for about one year. The patient indicates that over that period time, the pain has been stable, and has not progressed or worsened. The patient indicates that he has a sensation of discomfort as if he is walking on pine needles when he gets up on his feet. The patient notes that when he takes his shoes off, and rests, the pain and discomfort will go away. The patient does not have problems sleeping at night. The patient has the sensation as soon as he bears weight on his feet. The patient does have a history of prior lumbosacral spine surgery, and he does have some low back pain. The patient denies any pain radiating down from the low back to the feet. The patient denies neck pain or pain down the arms. The patient has not had any problems controlling the bowels or the bladder. The patient has had a bladder reconstruction following a bowel-bladder fistula. The patient does have a history of a vitamin B12 deficiency, and he is on B12 shots. The patient denies any numbness or uncomfortable sensations in the hands. The patient denies any unusual sensations on the top of the foot, only on the bottom. Nerve conduction studies done through our office recently were unremarkable. The patient has been using a cane for 5 or 6 months, with some mild gait instability. The patient reports that he recently twisted his right ankle, and he is having pain with weightbearing currently.  Past Medical History  Diagnosis Date  . Aortic valve disorder     s/p TAVR at [facility]   . PVD (peripheral vascular disease)   . Hypercholesteremia   . HTN (hypertension)   . Atrial fibrillation     Chronic  . Atrial flutter   . CKD (chronic kidney disease)   . AAA (abdominal aortic aneurysm)   . Atherosclerosis of native  arteries of the extremities with intermittent claudication   . CAD (coronary artery disease)     Status post CABG, stent to diagonal prior to TAVR  . Bladder cancer   . Gallstone   . Vitamin B12 deficiency   . Colon cancer   . Carotid stenosis   . Gout     Past Surgical History  Procedure Laterality Date  . Coronary artery bypass graft    . Laminectomy    . Appendectomy    . Colectomy    . Toe surgery    . Hemicolectomy    . Mohs surgery    . Angioplasty      Family History  Problem Relation Age of Onset  . Cancer Mother     Colon cancer  . Heart disease Father   . Diabetes Father   . Cancer Brother     esophageal    Social history:  reports that he has quit smoking. He has never used smokeless tobacco. He reports that he drinks alcohol. He reports that he does not use illicit drugs.    Allergies  Allergen Reactions  . Ramipril     Medications:  Current Outpatient Prescriptions on File Prior to Visit  Medication Sig Dispense Refill  . atorvastatin (LIPITOR) 40 MG tablet Take 40 mg by mouth Daily.      . citric acid-sodium citrate (ORACIT) 334-500 MG/5ML solution Take 15  mLs by mouth once. 1 Teaspoon Daily      . COUMADIN 5 MG tablet Take 5 mg by mouth as directed.      . furosemide (LASIX) 40 MG tablet Take 2 tablets (80 mg total) by mouth daily.  90 tablet  1  . metolazone (ZAROXOLYN) 2.5 MG tablet 1 tablet po once a week      . metoprolol tartrate (LOPRESSOR) 25 MG tablet Take 1 tablet (25 mg total) by mouth 2 (two) times daily.  90 tablet  1   No current facility-administered medications on file prior to visit.    ROS:  Out of a complete 14 system review of symptoms, the patient complains only of the following symptoms, and all other reviewed systems are negative.  Heart murmur Shortness of breath Incontinence of bowel and bladder Feeling cold Joint swelling Restless legs  Blood pressure 140/70, pulse 80, height 5\' 8"  (1.727 m), weight 175 lb  (79.379 kg).  Physical Exam   Physical Exam  General: The patient is alert and cooperative at the time of the examination.  Eyes: Pupils are equal, round, and reactive to light. Discs are flat bilaterally.  Neck: The neck is supple, no carotid bruits are noted.  Respiratory: The respiratory examination is clear.  Cardiovascular: The cardiovascular examination reveals an occasional irregular heart rhythm, no obvious murmurs or rubs are noted.  Skin: Extremities are with 3+ edema on the right leg below the knee, 2+ edema on the left leg below-the-knee.  Neurologic Exam  Mental status: The patient is alert and oriented x 3 at the time of the examination. The patient has apparent normal recent and remote memory, with an apparently normal attention span and concentration ability.  Cranial nerves: Facial symmetry is present. There is good sensation of the face to pinprick and soft touch bilaterally. The strength of the facial muscles and the muscles to head turning and shoulder shrug are normal bilaterally. Speech is dysphonic, not aphasic. Extraocular movements are full. Visual fields are full. The tongue is midline, and the patient has symmetric elevation of the soft palate. No obvious hearing deficits are noted.  Motor: The motor testing reveals 5 over 5 strength of all 4 extremities. Good symmetric motor tone is noted throughout.  Sensory: Sensory testing is intact to pinprick, soft touch, vibration sensation, and position sense on all 4 extremities. No evidence of a stocking pattern pinprick sensory deficit is noted. No evidence of extinction is noted.  Coordination: Cerebellar testing reveals good finger-nose-finger and heel-to-shin bilaterally.  Gait and station: Gait is associated with a limping quality on the right leg. Tandem gait was not attempted. Romberg is negative. No drift is seen.  Reflexes: Deep tendon reflexes are symmetric and normal bilaterally in the upper  extremities. On the lower extremities, the left knee jerk reflex is depressed relative to the right. Ankle jerk reflexes are depressed bilaterally. Toes are downgoing bilaterally.     Assessment/Plan:  1. Bilateral foot discomfort  The patient is having discomfort in the feet only with weightbearing, not when off of the feet. The history is not completely typical for a peripheral neuropathy issue. The nerve conduction studies were unremarkable. The patient could have intrinsic issues with the feet such as a plantar fasciitis or arthritis. The patient does have prior lumbosacral spine disease, and spinal stenosis does need to be considered. The patient indicates that even if he knew that he had significant spinal stenosis, he would not want any spinal injections or surgery.  The patient has an appointment to see Dr. Sharol Given in the near future. The patient has not had an x-ray of the right ankle yet to rule out fracture. The patient will have some blood work done today, and he will followup if needed.  Jill Alexanders MD 06/05/2013 8:26 PM  [facility] [address] [place], [place] [address]  Phone [phone] Fax [phone]

## 2013-06-06 ENCOUNTER — Encounter (HOSPITAL_COMMUNITY): Payer: Medicare Other

## 2013-06-07 ENCOUNTER — Encounter (HOSPITAL_COMMUNITY): Payer: Medicare Other

## 2013-06-08 ENCOUNTER — Encounter (HOSPITAL_COMMUNITY): Payer: Medicare Other

## 2013-06-13 ENCOUNTER — Encounter (HOSPITAL_COMMUNITY): Payer: Medicare Other

## 2013-06-14 ENCOUNTER — Encounter (HOSPITAL_COMMUNITY): Payer: Medicare Other

## 2013-06-15 ENCOUNTER — Encounter (HOSPITAL_COMMUNITY): Payer: Medicare Other

## 2013-06-20 ENCOUNTER — Encounter (HOSPITAL_COMMUNITY)
Admission: RE | Admit: 2013-06-20 | Discharge: 2013-06-20 | Disposition: A | Discharge status: Home / Self Care | Payer: Self-pay | Source: Ambulatory Visit | Attending: Interventional Cardiology | Admitting: Interventional Cardiology

## 2013-06-20 DIAGNOSIS — I2510 Atherosclerotic heart disease of native coronary artery without angina pectoris: Secondary | ICD-10-CM | POA: Insufficient documentation

## 2013-06-20 DIAGNOSIS — I4891 Unspecified atrial fibrillation: Secondary | ICD-10-CM | POA: Insufficient documentation

## 2013-06-20 DIAGNOSIS — I4892 Unspecified atrial flutter: Secondary | ICD-10-CM | POA: Insufficient documentation

## 2013-06-20 DIAGNOSIS — Z5189 Encounter for other specified aftercare: Secondary | ICD-10-CM | POA: Insufficient documentation

## 2013-06-20 DIAGNOSIS — Z951 Presence of aortocoronary bypass graft: Secondary | ICD-10-CM | POA: Insufficient documentation

## 2013-06-21 ENCOUNTER — Other Ambulatory Visit: Payer: Self-pay

## 2013-06-21 ENCOUNTER — Encounter (HOSPITAL_COMMUNITY)
Admission: RE | Admit: 2013-06-21 | Discharge: 2013-06-21 | Disposition: A | Discharge status: Home / Self Care | Payer: Self-pay | Source: Ambulatory Visit | Attending: Interventional Cardiology | Admitting: Interventional Cardiology

## 2013-06-21 MED ORDER — METOPROLOL TARTRATE 25 MG PO TABS: 25.0000 mg | ORAL_TABLET | Freq: Two times a day (BID) | ORAL | Status: DC

## 2013-06-22 ENCOUNTER — Encounter (HOSPITAL_COMMUNITY): Payer: Medicare Other

## 2013-06-26 ENCOUNTER — Other Ambulatory Visit: Payer: Self-pay

## 2013-06-26 ENCOUNTER — Inpatient Hospital Stay (HOSPITAL_COMMUNITY)
Admission: EM | Admit: 2013-06-26 | Discharge: 2013-07-08 | DRG: 377 | Disposition: A | Discharge status: Home Health | Payer: Medicare Other | Attending: Internal Medicine | Admitting: Internal Medicine

## 2013-06-26 ENCOUNTER — Encounter (HOSPITAL_COMMUNITY): Payer: Self-pay | Admitting: Emergency Medicine

## 2013-06-26 ENCOUNTER — Telehealth: Payer: Self-pay | Admitting: *Deleted

## 2013-06-26 DIAGNOSIS — N390 Urinary tract infection, site not specified: Secondary | ICD-10-CM | POA: Diagnosis not present

## 2013-06-26 DIAGNOSIS — I4891 Unspecified atrial fibrillation: Secondary | ICD-10-CM

## 2013-06-26 DIAGNOSIS — I714 Abdominal aortic aneurysm, without rupture, unspecified: Secondary | ICD-10-CM

## 2013-06-26 DIAGNOSIS — N319 Neuromuscular dysfunction of bladder, unspecified: Secondary | ICD-10-CM | POA: Diagnosis present

## 2013-06-26 DIAGNOSIS — D500 Iron deficiency anemia secondary to blood loss (chronic): Secondary | ICD-10-CM | POA: Diagnosis present

## 2013-06-26 DIAGNOSIS — N189 Chronic kidney disease, unspecified: Secondary | ICD-10-CM | POA: Diagnosis present

## 2013-06-26 DIAGNOSIS — I10 Essential (primary) hypertension: Secondary | ICD-10-CM

## 2013-06-26 DIAGNOSIS — Z7901 Long term (current) use of anticoagulants: Secondary | ICD-10-CM

## 2013-06-26 DIAGNOSIS — Z9861 Coronary angioplasty status: Secondary | ICD-10-CM

## 2013-06-26 DIAGNOSIS — Z79899 Other long term (current) drug therapy: Secondary | ICD-10-CM

## 2013-06-26 DIAGNOSIS — R710 Precipitous drop in hematocrit: Secondary | ICD-10-CM | POA: Diagnosis not present

## 2013-06-26 DIAGNOSIS — I2510 Atherosclerotic heart disease of native coronary artery without angina pectoris: Secondary | ICD-10-CM | POA: Diagnosis present

## 2013-06-26 DIAGNOSIS — Z87891 Personal history of nicotine dependence: Secondary | ICD-10-CM

## 2013-06-26 DIAGNOSIS — D62 Acute posthemorrhagic anemia: Secondary | ICD-10-CM | POA: Diagnosis present

## 2013-06-26 DIAGNOSIS — N183 Chronic kidney disease, stage 3 unspecified: Secondary | ICD-10-CM | POA: Diagnosis present

## 2013-06-26 DIAGNOSIS — K648 Other hemorrhoids: Secondary | ICD-10-CM | POA: Diagnosis present

## 2013-06-26 DIAGNOSIS — Z951 Presence of aortocoronary bypass graft: Secondary | ICD-10-CM

## 2013-06-26 DIAGNOSIS — R578 Other shock: Secondary | ICD-10-CM | POA: Diagnosis present

## 2013-06-26 DIAGNOSIS — E7800 Pure hypercholesterolemia, unspecified: Secondary | ICD-10-CM | POA: Diagnosis present

## 2013-06-26 DIAGNOSIS — D126 Benign neoplasm of colon, unspecified: Secondary | ICD-10-CM | POA: Diagnosis present

## 2013-06-26 DIAGNOSIS — E872 Acidosis, unspecified: Secondary | ICD-10-CM | POA: Diagnosis present

## 2013-06-26 DIAGNOSIS — K921 Melena: Principal | ICD-10-CM | POA: Diagnosis present

## 2013-06-26 DIAGNOSIS — Z7982 Long term (current) use of aspirin: Secondary | ICD-10-CM

## 2013-06-26 DIAGNOSIS — Z8551 Personal history of malignant neoplasm of bladder: Secondary | ICD-10-CM

## 2013-06-26 DIAGNOSIS — R634 Abnormal weight loss: Secondary | ICD-10-CM | POA: Diagnosis present

## 2013-06-26 DIAGNOSIS — E876 Hypokalemia: Secondary | ICD-10-CM | POA: Diagnosis present

## 2013-06-26 DIAGNOSIS — I482 Chronic atrial fibrillation, unspecified: Secondary | ICD-10-CM | POA: Diagnosis present

## 2013-06-26 DIAGNOSIS — R579 Shock, unspecified: Secondary | ICD-10-CM

## 2013-06-26 DIAGNOSIS — Z6825 Body mass index (BMI) 25.0-25.9, adult: Secondary | ICD-10-CM

## 2013-06-26 DIAGNOSIS — I739 Peripheral vascular disease, unspecified: Secondary | ICD-10-CM | POA: Diagnosis present

## 2013-06-26 DIAGNOSIS — D1330 Benign neoplasm of unspecified part of small intestine: Secondary | ICD-10-CM | POA: Diagnosis present

## 2013-06-26 DIAGNOSIS — K922 Gastrointestinal hemorrhage, unspecified: Secondary | ICD-10-CM

## 2013-06-26 DIAGNOSIS — Z85038 Personal history of other malignant neoplasm of large intestine: Secondary | ICD-10-CM

## 2013-06-26 DIAGNOSIS — E440 Moderate protein-calorie malnutrition: Secondary | ICD-10-CM | POA: Diagnosis present

## 2013-06-26 DIAGNOSIS — C679 Malignant neoplasm of bladder, unspecified: Secondary | ICD-10-CM | POA: Diagnosis present

## 2013-06-26 DIAGNOSIS — I4510 Unspecified right bundle-branch block: Secondary | ICD-10-CM | POA: Diagnosis present

## 2013-06-26 DIAGNOSIS — N179 Acute kidney failure, unspecified: Secondary | ICD-10-CM | POA: Diagnosis present

## 2013-06-26 DIAGNOSIS — I359 Nonrheumatic aortic valve disorder, unspecified: Secondary | ICD-10-CM

## 2013-06-26 DIAGNOSIS — J9600 Acute respiratory failure, unspecified whether with hypoxia or hypercapnia: Secondary | ICD-10-CM | POA: Diagnosis not present

## 2013-06-26 LAB — PROTIME-INR
INR: 4.17 — ABNORMAL HIGH (ref 0.00–1.49)
Prothrombin Time: 38.7 seconds — ABNORMAL HIGH (ref 11.6–15.2)

## 2013-06-26 LAB — CBC WITH DIFFERENTIAL/PLATELET
Basophils Absolute: 0.1 10*3/uL (ref 0.0–0.1)
Basophils Relative: 1 % (ref 0–1)
Eosinophils Absolute: 0.3 10*3/uL (ref 0.0–0.7)
Eosinophils Relative: 3 % (ref 0–5)
HCT: 19.2 % — ABNORMAL LOW (ref 39.0–52.0)
Hemoglobin: 6.3 g/dL — CL (ref 13.0–17.0)
LYMPHS ABS: 1.2 10*3/uL (ref 0.7–4.0)
LYMPHS PCT: 13 % (ref 12–46)
MCH: 32.6 pg (ref 26.0–34.0)
MCHC: 32.8 g/dL (ref 30.0–36.0)
MCV: 99.5 fL (ref 78.0–100.0)
Monocytes Absolute: 0.9 10*3/uL (ref 0.1–1.0)
Monocytes Relative: 10 % (ref 3–12)
NEUTROS PCT: 74 % (ref 43–77)
Neutro Abs: 7.1 10*3/uL (ref 1.7–7.7)
PLATELETS: 283 10*3/uL (ref 150–400)
RBC: 1.93 MIL/uL — ABNORMAL LOW (ref 4.22–5.81)
RDW: 18.3 % — ABNORMAL HIGH (ref 11.5–15.5)
WBC: 9.6 10*3/uL (ref 4.0–10.5)

## 2013-06-26 LAB — COMPREHENSIVE METABOLIC PANEL
ALK PHOS: 99 U/L (ref 39–117)
ALT: 16 U/L (ref 0–53)
AST: 20 U/L (ref 0–37)
Albumin: 3 g/dL — ABNORMAL LOW (ref 3.5–5.2)
BILIRUBIN TOTAL: 0.5 mg/dL (ref 0.3–1.2)
BUN: 91 mg/dL — ABNORMAL HIGH (ref 6–23)
CHLORIDE: 99 meq/L (ref 96–112)
CO2: 21 mEq/L (ref 19–32)
Calcium: 8.6 mg/dL (ref 8.4–10.5)
Creatinine, Ser: 2.78 mg/dL — ABNORMAL HIGH (ref 0.50–1.35)
GFR, EST AFRICAN AMERICAN: 23 mL/min — AB (ref >90)
GFR, EST NON AFRICAN AMERICAN: 20 mL/min — AB (ref >90)
GLUCOSE: 165 mg/dL — AB (ref 70–99)
POTASSIUM: 3.9 meq/L (ref 3.7–5.3)
SODIUM: 136 meq/L — AB (ref 137–147)
Total Protein: 6.5 g/dL (ref 6.0–8.3)

## 2013-06-26 LAB — POC OCCULT BLOOD, ED: Fecal Occult Bld: POSITIVE — AB

## 2013-06-26 LAB — PREPARE RBC (CROSSMATCH)

## 2013-06-26 MED ORDER — PANTOPRAZOLE SODIUM 40 MG IV SOLR
40.0000 mg | Freq: Once | INTRAVENOUS | Status: AC
  Administered 2013-06-26: 40 mg via INTRAVENOUS
  Filled 2013-06-26: qty 40

## 2013-06-26 MED ORDER — PANTOPRAZOLE SODIUM 40 MG IV SOLR
40.0000 mg | Freq: Two times a day (BID) | INTRAVENOUS | Status: DC
Start: 2013-06-30 — End: 2013-06-29
  Filled 2013-06-26 (×2): qty 40

## 2013-06-26 MED ORDER — SODIUM CHLORIDE 0.9 % IV SOLN
8.0000 mg/h | INTRAVENOUS | Status: DC
  Administered 2013-06-26 – 2013-06-29 (×5): 8 mg/h via INTRAVENOUS
  Filled 2013-06-26 (×12): qty 80

## 2013-06-26 NOTE — H&P (Signed)
PCP:  Horton Finer, MD  Cardiology [facility]  Chief Complaint:  melena  HPI: [name] is a 80 y.o. male   has a past medical history of Aortic valve disorder; PVD (peripheral vascular disease); Hypercholesteremia; HTN (hypertension); Atrial fibrillation; Atrial flutter; CKD (chronic kidney disease); AAA (abdominal aortic aneurysm); Atherosclerosis of native arteries of the extremities with intermittent claudication; CAD (coronary artery disease); Bladder cancer; Gallstone; Vitamin B12 deficiency; Colon cancer; Carotid stenosis; Gout; and Gout.   Presented with  10 day hx of black stools, feeling tired and fatigued and cold all the time. Denies any chest pain but endorses SOB and DOE. Patient was evaluated for this today by his PCP and had a CBC done showing Hg of 6.3 he was asked to come to ER. Hospitalist asked to admit. Patient has history of atrial fibrillation, coronary disease aortic valve repair with bioprosthetic valve. Patient is taking Coumadin likely for atrial fibrillation. ER physician spoke with cardiology fellow on call who recommends vitamin K and stopping Coumadin at this point. There is no indication for anticoagulation for an aortic valve which bioprosthetic. Note patient reports having any colonoscopy done by [facility] 4 years ago. Review of Systems:    Pertinent positives include: fatigue, shortness of breath at rest.  dyspnea on exertion,   Constitutional:  No weight loss, night sweats, Fevers, chills, weight loss  HEENT:  No headaches, Difficulty swallowing,Tooth/dental problems,Sore throat,  No sneezing, itching, ear ache, nasal congestion, post nasal drip,  Cardio-vascular:  No chest pain, Orthopnea, PND, anasarca, dizziness, palpitations.no Bilateral lower extremity swelling  GI:  No heartburn, indigestion, abdominal pain, nausea, vomiting, diarrhea, change in bowel habits, loss of appetite, melena, blood in stool, hematemesis Resp:  no No  excess mucus, no productive cough, No non-productive cough, No coughing up of blood.No change in color of mucus.No wheezing. Skin:  no rash or lesions. No jaundice GU:  no dysuria, change in color of urine, no urgency or frequency. No straining to urinate.  No flank pain.  Musculoskeletal:  No joint pain or no joint swelling. No decreased range of motion. No back pain.  Psych:  No change in mood or affect. No depression or anxiety. No memory loss.  Neuro: no localizing neurological complaints, no tingling, no weakness, no double vision, no gait abnormality, no slurred speech, no confusion  Otherwise ROS are negative except for above, 10 systems were reviewed  Past Medical History: Past Medical History  Diagnosis Date  . Aortic valve disorder     s/p [facility]AVR at [facility]   . PVD (peripheral vascular disease)   . Hypercholesteremia   . HTN (hypertension)   . Atrial fibrillation     Chronic  . Atrial flutter   . CKD (chronic kidney disease)   . AAA (abdominal aortic aneurysm)   . 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 CAD (coronary artery disease)     Status post CABG, stent to diagonal prior to TAVR  . Bladder cancer   . Gallstone   . Vitamin B12 deficiency   . Colon cancer   . Carotid stenosis   . Gout   . Gout    Past Surgical History  Procedure Laterality Date  . Coronary artery bypass graft    . Laminectomy    . Appendectomy    . Colectomy    . Toe surgery    . Hemicolectomy    . Mohs surgery    . Angioplasty  Medications: Prior to Admission medications   Medication Sig Start Date End Date Taking? Authorizing Provider  albuterol (PROVENTIL HFA;VENTOLIN HFA) 108 (90 BASE) MCG/ACT inhaler Inhale 2 puffs into the lungs every 6 (six) hours as needed for wheezing or shortness of breath.   Yes Historical Provider, MD  aspirin 81 MG tablet Take 81 mg by mouth every morning.   Yes Historical Provider, MD  atorvastatin  (LIPITOR) 40 MG tablet Take 40 mg by mouth daily at 6 PM.  11/09/11  Yes Historical Provider, MD  citric acid-sodium citrate (ORACIT) 334-500 MG/5ML solution Take 15 mLs by mouth every morning. 1 Tablespoon Daily   Yes Historical Provider, MD  colchicine 0.6 MG tablet Take 0.6 mg by mouth daily as needed (for gout).   Yes Historical Provider, MD  Cyanocobalamin (VITAMIN B-12 IJ) Inject 1 each as directed every 30 (thirty) days.   Yes Historical Provider, MD  famotidine (TH FAMOTIDINE 10) 10 MG tablet Take 10 mg by mouth every morning.   Yes Historical Provider, MD  febuxostat (ULORIC) 40 MG tablet Take 40 mg by mouth every morning.   Yes Historical Provider, MD  furosemide (LASIX) 40 MG tablet Take 40 mg by mouth every morning.   Yes Historical Provider, MD  metolazone (ZAROXOLYN) 2.5 MG tablet Take 2.5 mg by mouth daily as needed (for fluid).   Yes Historical Provider, MD  metoprolol tartrate (LOPRESSOR) 25 MG tablet Take 50 mg by mouth every morning.   Yes Historical Provider, MD  Multiple Vitamins-Iron (MULTIVITAMINS WITH IRON) TABS tablet Take 1 tablet by mouth every evening.    Yes Historical Provider, MD  warfarin (COUMADIN) 5 MG tablet Take 5 mg by mouth every morning. Takes 5mg  on  Monday and Friday . Takes 2.5mg  all other days   Yes Historical Provider, MD    Allergies:   Allergies  Allergen Reactions  . Ramipril Other (See Comments)    unknown    Social History:  Ambulatory  cane Lives at  Home with family   reports that he has quit smoking. He has never used smokeless tobacco. He reports that he drinks alcohol. He reports that he does not use illicit drugs.   Family History: family history includes Cancer in his brother and mother; Diabetes in his father; Heart disease in his father.    Physical Exam: Patient Vitals for the past 24 hrs:  BP Temp Temp src Pulse Resp SpO2 Height Weight  06/26/13 2206 119/52 mmHg - - - 14 100 % - -  06/26/13 2200 105/44 mmHg - - 89 13 97 %  - -  06/26/13 2130 119/41 mmHg - - 91 19 98 % - -  06/26/13 2030 132/98 mmHg - - 93 23 92 % - -  06/26/13 2000 124/43 mmHg - - 78 20 100 % - -  06/26/13 1945 105/45 mmHg - - - 14 100 % - -  06/26/13 1920 107/32 mmHg 98.3 F (36.8 C) Oral 117 20 100 % 5\' 8"  (1.727 m) 76.839 kg (169 lb 6.4 oz)    1. General:  in No Acute distress 2. Psychological: Alert and  Oriented 3. Head/ENT:   Moist  Mucous Membranes pale                          Head Non traumatic, neck supple  Normal   Dentition 4. SKIN:  decreased Skin turgor,  Skin clean Dry and intact no rash 5. Heart: Regular rate and rhythm loud systolic Murmur noted, Rub or gallop 6. Lungs: Clear to auscultation bilaterally, no wheezes or crackles   7. Abdomen: Soft, non-tender, Non distended 8. Lower extremities: no clubbing, cyanosis, or edema 9. Neurologically Grossly intact, moving all 4 extremities equally 10. MSK: Normal range of motion  body mass index is 25.76 kg/(m^2).   Labs on Admission:   Recent Labs  06/26/13 1939  NA 136*  K 3.9  CL 99  CO2 21  GLUCOSE 165*  BUN 91*  CREATININE 2.78*  CALCIUM 8.6    Recent Labs  06/26/13 1939  AST 20  ALT 16  ALKPHOS 99  BILITOT 0.5  PROT 6.5  ALBUMIN 3.0*   No results found for this basename: LIPASE, AMYLASE,  in the last 72 hours  Recent Labs  06/26/13 1939  WBC 9.6  NEUTROABS 7.1  HGB 6.3*  HCT 19.2*  MCV 99.5  PLT 283   No results found for this basename: CKTOTAL, CKMB, CKMBINDEX, TROPONINI,  in the last 72 hours No results found for this basename: TSH, T4TOTAL, FREET3, T3FREE, THYROIDAB,  in the last 72 hours No results found for this basename: VITAMINB12, FOLATE, FERRITIN, TIBC, IRON, RETICCTPCT,  in the last 72 hours No results found for this basename: HGBA1C    Estimated Creatinine Clearance: 20.5 ml/min (by C-G formula based on Cr of 2.78). ABG No results found for this basename: phart, pco2, po2, hco3, tco2, acidbasedef,  o2sat     No results found for this basename: DDIMER     Other results:  I have pearsonaly reviewed this: ECG REPORT  Rate: 71  Rhythm: A.fib with RBBB ST&T Change: no ischemia  Cultures: No results found for this basename: sdes, specrequest, cult, reptstatus       Radiological Exams on Admission: No results found.  Chart has been reviewed  Assessment/Plan 80 year old male with history of coronary disease, atrial fibrillation on Coumadin and aortic valve repair the bioprosthetic valve she has with upper GI bleed.  Present on Admission:  . Upper GI bleed - admit to step down, transfuse 2 units of packed red blood cells, and give vitamin K. CBC every 6 hours, GI consult in a.m. will start on Protonix drip  . CKD (chronic kidney disease) -creatinine at baseline we'll avoid renal toxic medications  . Aortic valve disorder - monitor carefully patient has no indication for anticoagulation from that standpoint  . Atrial fibrillation - hold warfarin and aspirin given GI bleed tenia metoprolol for rate control  . HTN (hypertension) - somewhat slow blood pressure hold the rest of blood pressure medications  . Coronary atherosclerosis of native coronary artery - given worsening shortness of breath we'll cycle cardiac enzymes and does most likely secondary to demand GU 2 anemia   blood loss induced anemia - we'll transfuse 2 units of cycle CBC   Prophylaxis: SCD  , Protonix  CODE STATUS: FULL CODE  Other plan as per orders.  I have spent a total of 55 min on this admission  Cameka Rae 06/26/2013, 10:16 PM

## 2013-06-26 NOTE — ED Notes (Signed)
Patient presents stating that he went to see his doctor and went home and by the time he got home the phone was ringing and was told to come to the ED because his HGB was low

## 2013-06-26 NOTE — Telephone Encounter (Signed)
Patient requested to speak to you regarding some testing and his medications. This is the only info he wanted to disclose. Thanks, MI

## 2013-06-26 NOTE — ED Provider Notes (Signed)
CSN: [id number]     Arrival date & time 06/26/13  1904 History   First MD Initiated Contact with Patient 06/26/13 1953     Chief Complaint  Patient presents with  . Low HGB      (Consider location/radiation/quality/duration/timing/severity/associated sxs/prior Treatment) The history is provided by the patient.   patient was sent in to the ED from his PCP for a hemoglobin of 6. He states he's been fatigued for the last 10 days. He states he has had dark stools for slightly longer than that. He is on Coumadin for atrial fibrillation and has an aortic valve replacement. No fevers. No chest pain. No nausea vomiting. No abdominal pain. He states that he does not have a history of GI bleeding. He states he has had black swelling stools for 2 weeks. He states he just gets fatigued with exertion.  Past Medical History  Diagnosis Date  . Aortic valve disorder     s/p TAVR at [facility]   . PVD (peripheral vascular disease)   . Hypercholesteremia   . HTN (hypertension)   . Atrial fibrillation     Chronic  . Atrial flutter   . CKD (chronic kidney disease)   . AAA (abdominal aortic aneurysm)   . 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 CAD (coronary artery disease)     Status post CABG, stent to diagonal prior to TAVR  . Bladder cancer   . Gallstone   . Vitamin B12 deficiency   . Colon cancer   . Carotid stenosis   . Gout    Past Surgical History  Procedure Laterality Date  . Coronary artery bypass graft    . Laminectomy    . Appendectomy    . Colectomy    . Toe surgery    . Hemicolectomy    . Mohs surgery    . Angioplasty     Family History  Problem Relation Age of Onset  . Cancer Mother     Colon cancer  . Heart disease Father   . Diabetes Father   . Cancer Brother     esophageal   History  Substance Use Topics  . Smoking status: Former Research scientist (life sciences)  . Smokeless tobacco: Never Used  . Alcohol Use: Yes     Comment: occasional     Review of Systems  Constitutional: Positive for fatigue. Negative for activity change and appetite change.  Eyes: Negative for pain.  Respiratory: Negative for chest tightness and shortness of breath.   Cardiovascular: Negative for chest pain and leg swelling.  Gastrointestinal: Positive for blood in stool. Negative for nausea, vomiting, abdominal pain and diarrhea.  Genitourinary: Negative for flank pain.  Musculoskeletal: Negative for back pain and neck stiffness.  Skin: Negative for rash.  Neurological: Negative for weakness, numbness and headaches.  Psychiatric/Behavioral: Negative for behavioral problems.      Allergies  Ramipril  Home Medications   Current Outpatient Rx  Name  Route  Sig  Dispense  Refill  . atorvastatin (LIPITOR) 40 MG tablet   Oral   Take 40 mg by mouth Daily.         . citric acid-sodium citrate (ORACIT) 334-500 MG/5ML solution   Oral   Take 15 mLs by mouth once. 1 Teaspoon Daily         . COUMADIN 5 MG tablet   Oral   Take 5 mg by mouth as directed.         . Cyanocobalamin (  VITAMIN B-12 IJ)   Injection   Inject 1 each as directed every 30 (thirty) days.         . furosemide (LASIX) 40 MG tablet   Oral   Take 2 tablets (80 mg total) by mouth daily.   90 tablet   1   . metolazone (ZAROXOLYN) 2.5 MG tablet      1 tablet po once a week         . metoprolol tartrate (LOPRESSOR) 25 MG tablet   Oral   Take 1 tablet (25 mg total) by mouth 2 (two) times daily.   90 tablet   1   . Multiple Vitamin (MULTIVITAMIN WITH MINERALS) TABS tablet   Oral   Take 1 tablet by mouth daily.          BP 105/45  Pulse 117  Temp(Src) 98.3 F (36.8 C) (Oral)  Resp 14  Ht 5\' 8"  (1.727 m)  Wt 169 lb 6.4 oz (76.839 kg)  BMI 25.76 kg/m2  SpO2 100% Physical Exam  Nursing note and vitals reviewed. Constitutional: He is oriented to person, place, and time. He appears well-developed and well-nourished.  HENT:  Head: Normocephalic and  atraumatic.  Eyes: EOM are normal. Pupils are equal, round, and reactive to light.  Neck: Normal range of motion. Neck supple.  Cardiovascular: Normal rate.   Murmur heard. Pulmonary/Chest: Effort normal and breath sounds normal.  Abdominal: Soft. Bowel sounds are normal. He exhibits no distension and no mass. There is no tenderness. There is no rebound and no guarding.  Musculoskeletal: Normal range of motion. He exhibits no edema.  Neurological: He is alert and oriented to person, place, and time. No cranial nerve deficit.  Skin: Skin is warm and dry. There is pallor.  Psychiatric: He has a normal mood and affect.    ED Course  Procedures (including critical care time) Labs Review Labs Reviewed  CBC WITH DIFFERENTIAL - Abnormal; Notable for the following:    RBC 1.93 (*)    Hemoglobin 6.3 (*)    HCT 19.2 (*)    RDW 18.3 (*)    All other components within normal limits  PROTIME-INR - Abnormal; Notable for the following:    Prothrombin Time 38.7 (*)    INR 4.17 (*)    All other components within normal limits  COMPREHENSIVE METABOLIC PANEL   Imaging Review No results found.   EKG Interpretation None      MDM   Final diagnoses:  None    Patient presents with anemia from a GI bleed. He is on Coumadin for A. fib and his valve. After discussion with cardiology he is not need severe anticoagulation for the bioprosthetic valve. He recommends vitamin K only. Will admit to internal medicine.     Jasper Riling. Alvino Chapel, MD 06/26/13 2210

## 2013-06-26 NOTE — Telephone Encounter (Signed)
Left a message for patient to call back. 

## 2013-06-26 NOTE — ED Notes (Signed)
Critical HGB 6.3 from Lab. Dr. Alvino Chapel aware.

## 2013-06-26 NOTE — ED Notes (Signed)
Family states she thinks the call from the doctor told them his hemoglobin was 6

## 2013-06-27 ENCOUNTER — Encounter (HOSPITAL_COMMUNITY): Admission: RE | Admit: 2013-06-27 | Payer: Medicare Other | Source: Ambulatory Visit

## 2013-06-27 ENCOUNTER — Inpatient Hospital Stay (HOSPITAL_COMMUNITY): Payer: Medicare Other

## 2013-06-27 DIAGNOSIS — E440 Moderate protein-calorie malnutrition: Secondary | ICD-10-CM | POA: Diagnosis present

## 2013-06-27 LAB — COMPREHENSIVE METABOLIC PANEL
ALT: 13 U/L (ref 0–53)
AST: 17 U/L (ref 0–37)
Albumin: 2.9 g/dL — ABNORMAL LOW (ref 3.5–5.2)
Alkaline Phosphatase: 96 U/L (ref 39–117)
BUN: 89 mg/dL — ABNORMAL HIGH (ref 6–23)
CO2: 20 meq/L (ref 19–32)
CREATININE: 2.66 mg/dL — AB (ref 0.50–1.35)
Calcium: 8.4 mg/dL (ref 8.4–10.5)
Chloride: 102 mEq/L (ref 96–112)
GFR, EST AFRICAN AMERICAN: 24 mL/min — AB (ref >90)
GFR, EST NON AFRICAN AMERICAN: 21 mL/min — AB (ref >90)
GLUCOSE: 113 mg/dL — AB (ref 70–99)
Potassium: 4.1 mEq/L (ref 3.7–5.3)
SODIUM: 139 meq/L (ref 137–147)
Total Bilirubin: 0.6 mg/dL (ref 0.3–1.2)
Total Protein: 6 g/dL (ref 6.0–8.3)

## 2013-06-27 LAB — CBC
HCT: 26.2 % — ABNORMAL LOW (ref 39.0–52.0)
HEMATOCRIT: 21.5 % — AB (ref 39.0–52.0)
Hemoglobin: 7.3 g/dL — ABNORMAL LOW (ref 13.0–17.0)
Hemoglobin: 8.9 g/dL — ABNORMAL LOW (ref 13.0–17.0)
MCH: 32.3 pg (ref 26.0–34.0)
MCH: 32 pg (ref 26.0–34.0)
MCHC: 34 g/dL (ref 30.0–36.0)
MCHC: 34 g/dL (ref 30.0–36.0)
MCV: 95.1 fL (ref 78.0–100.0)
MCV: 94.2 fL (ref 78.0–100.0)
Platelets: 250 10*3/uL (ref 150–400)
Platelets: 249 10*3/uL (ref 150–400)
RBC: 2.26 MIL/uL — ABNORMAL LOW (ref 4.22–5.81)
RBC: 2.78 MIL/uL — ABNORMAL LOW (ref 4.22–5.81)
RDW: 19.4 % — AB (ref 11.5–15.5)
RDW: 20.3 % — ABNORMAL HIGH (ref 11.5–15.5)
WBC: 9.9 10*3/uL (ref 4.0–10.5)
WBC: 11.7 10*3/uL — ABNORMAL HIGH (ref 4.0–10.5)

## 2013-06-27 LAB — TSH: TSH: 1.482 u[IU]/mL (ref 0.350–4.500)

## 2013-06-27 LAB — PROTIME-INR
INR: 4.05 — AB (ref 0.00–1.49)
Prothrombin Time: 37.8 seconds — ABNORMAL HIGH (ref 11.6–15.2)

## 2013-06-27 LAB — PHOSPHORUS: PHOSPHORUS: 4.6 mg/dL (ref 2.3–4.6)

## 2013-06-27 LAB — TROPONIN I
Troponin I: <0.3 ng/mL (ref <0.30)
Troponin I: <0.3 ng/mL (ref <0.30)

## 2013-06-27 LAB — MRSA PCR SCREENING: MRSA by PCR: NEGATIVE

## 2013-06-27 LAB — MAGNESIUM: MAGNESIUM: 2.3 mg/dL (ref 1.5–2.5)

## 2013-06-27 MED ORDER — COLCHICINE 0.6 MG PO TABS
0.6000 mg | ORAL_TABLET | Freq: Every day | ORAL | Status: DC | PRN
  Filled 2013-06-27: qty 1

## 2013-06-27 MED ORDER — ONDANSETRON HCL 4 MG PO TABS: 4.0000 mg | ORAL_TABLET | Freq: Four times a day (QID) | ORAL | Status: DC | PRN

## 2013-06-27 MED ORDER — ATORVASTATIN CALCIUM 40 MG PO TABS
40.0000 mg | ORAL_TABLET | Freq: Every day | ORAL | Status: DC
  Administered 2013-06-27 – 2013-06-30 (×4): 40 mg via ORAL
  Filled 2013-06-27 (×5): qty 1

## 2013-06-27 MED ORDER — SODIUM CHLORIDE 0.9 % IJ SOLN
3.0000 mL | Freq: Two times a day (BID) | INTRAMUSCULAR | Status: DC
Start: 2013-06-27 — End: 2013-06-29
  Administered 2013-06-27 – 2013-06-29 (×3): 3 mL via INTRAVENOUS

## 2013-06-27 MED ORDER — HYDROCODONE-ACETAMINOPHEN 5-325 MG PO TABS: 1.0000 | ORAL_TABLET | ORAL | Status: DC | PRN

## 2013-06-27 MED ORDER — SODIUM CHLORIDE 0.9 % IV SOLN: 500.0000 mL | Freq: Once | INTRAVENOUS | Status: DC

## 2013-06-27 MED ORDER — DEXTROSE-NACL 5-0.9 % IV SOLN
INTRAVENOUS | Status: AC
  Administered 2013-06-27: 1000 mL via INTRAVENOUS

## 2013-06-27 MED ORDER — ONDANSETRON HCL 4 MG/2ML IJ SOLN: 4.0000 mg | Freq: Four times a day (QID) | INTRAMUSCULAR | Status: DC | PRN

## 2013-06-27 MED ORDER — PHYTONADIONE 5 MG PO TABS
2.5000 mg | ORAL_TABLET | Freq: Once | ORAL | Status: AC
  Administered 2013-06-27: 2.5 mg via ORAL
  Filled 2013-06-27: qty 1

## 2013-06-27 MED ORDER — FAMOTIDINE 10 MG PO TABS
10.0000 mg | ORAL_TABLET | Freq: Every morning | ORAL | Status: DC
  Administered 2013-06-27: 10 mg via ORAL
  Filled 2013-06-27 (×3): qty 1

## 2013-06-27 MED ORDER — ALBUTEROL SULFATE (2.5 MG/3ML) 0.083% IN NEBU
3.0000 mL | INHALATION_SOLUTION | Freq: Four times a day (QID) | RESPIRATORY_TRACT | Status: DC | PRN
  Administered 2013-07-01: 3 mL via RESPIRATORY_TRACT
  Filled 2013-06-27: qty 3

## 2013-06-27 MED ORDER — SODIUM CHLORIDE 0.9 % IJ SOLN
3.0000 mL | Freq: Two times a day (BID) | INTRAMUSCULAR | Status: DC
  Administered 2013-06-27 – 2013-06-29 (×2): 3 mL via INTRAVENOUS

## 2013-06-27 MED ORDER — DIPHENHYDRAMINE HCL 25 MG PO CAPS
25.0000 mg | ORAL_CAPSULE | Freq: Once | ORAL | Status: DC
  Filled 2013-06-27: qty 1

## 2013-06-27 MED ORDER — ACETAMINOPHEN 325 MG PO TABS: 650.0000 mg | ORAL_TABLET | Freq: Four times a day (QID) | ORAL | Status: DC | PRN

## 2013-06-27 MED ORDER — SODIUM CHLORIDE 0.9 % IV SOLN
250.0000 mL | INTRAVENOUS | Status: DC | PRN
  Administered 2013-07-01: 13:00:00 via INTRAVENOUS

## 2013-06-27 MED ORDER — METOPROLOL TARTRATE 25 MG PO TABS
25.0000 mg | ORAL_TABLET | Freq: Two times a day (BID) | ORAL | Status: DC
  Administered 2013-06-27 – 2013-06-30 (×7): 25 mg via ORAL
  Filled 2013-06-27 (×10): qty 1

## 2013-06-27 MED ORDER — ACETAMINOPHEN 650 MG RE SUPP: 650.0000 mg | Freq: Four times a day (QID) | RECTAL | Status: DC | PRN

## 2013-06-27 MED ORDER — ACETAMINOPHEN 325 MG PO TABS
650.0000 mg | ORAL_TABLET | Freq: Once | ORAL | Status: DC
  Filled 2013-06-27: qty 2

## 2013-06-27 MED ORDER — SODIUM CHLORIDE 0.9 % IJ SOLN: 3.0000 mL | INTRAMUSCULAR | Status: DC | PRN

## 2013-06-27 MED ORDER — FEBUXOSTAT 40 MG PO TABS
40.0000 mg | ORAL_TABLET | Freq: Every morning | ORAL | Status: DC
  Administered 2013-06-27 – 2013-07-04 (×5): 40 mg via ORAL
  Filled 2013-06-27 (×8): qty 1

## 2013-06-27 MED ORDER — DOCUSATE SODIUM 100 MG PO CAPS
100.0000 mg | ORAL_CAPSULE | Freq: Two times a day (BID) | ORAL | Status: DC
  Administered 2013-06-27 – 2013-06-29 (×3): 100 mg via ORAL
  Filled 2013-06-27 (×10): qty 1

## 2013-06-27 NOTE — Progress Notes (Signed)
Utilization Review Completed.  

## 2013-06-27 NOTE — Progress Notes (Signed)
Spoke with cardiology who will see him. Also spoke to GI (Dr. Paulita Fujita) who wants his INR < 2 before he has EGD. He has been given Vit K po (small dose) and orders are in for daily INR

## 2013-06-27 NOTE — Telephone Encounter (Signed)
Patient went to hospital yesterday with low hemoglobin, and hope to find source of it today.  Patient stated he was running out of metolazone, and that is the reason he called.  He will get it refilled when he is discharged from hospital, so doesn't need any further assistance from [place] at this time.

## 2013-06-27 NOTE — Progress Notes (Signed)
Assessment/Plan: Principal Problem:   Anemia, blood loss - has now received 2 U PRBCs. Recheck CBC at 1 PM and in AM.  Active Problems:   CKD (chronic kidney disease) - has acute worsening due to volume. Creatinine is usually around 2   Aortic valve disorder - s/p TAVR. I am not that familiar with management of this so will ask cardiology to look over and be sure that I am doing everything correctly.    HTN (hypertension)   Atrial fibrillation - basis for coumadin is afib, not AVR as suggested in admit note.    Coronary atherosclerosis of native coronary artery   Upper GI bleed - will consult GI for EGD. Ice chips for now.    Subjective: Feels okay. No real complaints except he is thirsty!  Objective:  Vital Signs: Filed Vitals:   06/27/13 0300 06/27/13 0400 06/27/13 0500 06/27/13 0600  BP: 126/50 114/42 140/60 123/104  Pulse: 76 63 90 64  Temp: 98.1 F (36.7 C) 98.1 F (36.7 C) 98 F (36.7 C) 98.1 F (36.7 C)  TempSrc: Oral Oral Oral Oral  Resp: 22 17 19 26   Height:      Weight:      SpO2: 98% 96% 96% 96%     EXAM: Color looks better than in office yesterday!   Intake/Output Summary (Last 24 hours) at 06/27/13 0724 Last data filed at 06/27/13 0600  Gross per 24 hour  Intake 473.33 ml  Output      0 ml  Net 473.33 ml    Lab Results:  Recent Labs  06/26/13 1939 06/27/13 0305  NA 136* 139  K 3.9 4.1  CL 99 102  CO2 21 20  GLUCOSE 165* 113*  BUN 91* 89*  CREATININE 2.78* 2.66*  CALCIUM 8.6 8.4  MG  --  2.3  PHOS  --  4.6    Recent Labs  06/26/13 1939 06/27/13 0305  AST 20 17  ALT 16 13  ALKPHOS 99 96  BILITOT 0.5 0.6  PROT 6.5 6.0  ALBUMIN 3.0* 2.9*   No results found for this basename: LIPASE, AMYLASE,  in the last 72 hours  Recent Labs  06/26/13 1939 06/27/13 0305  WBC 9.6 9.9  NEUTROABS 7.1  --   HGB 6.3* 7.3*  HCT 19.2* 21.5*  MCV 99.5 95.1  PLT 283 250    Recent Labs  06/27/13 0305  TROPONINI <0.30   BNP No results found  for this basename: probnp   No results found for this basename: DDIMER,  in the last 72 hours No results found for this basename: HGBA1C,  in the last 72 hours No results found for this basename: CHOL, HDL, LDLCALC, TRIG, CHOLHDL, LDLDIRECT,  in the last 72 hours No results found for this basename: TSH, T4TOTAL, FREET3, T3FREE, THYROIDAB,  in the last 72 hours No results found for this basename: VITAMINB12, FOLATE, FERRITIN, TIBC, IRON, RETICCTPCT,  in the last 72 hours  Studies/Results: No results found. Medications: Medications administered in the last 24 hours reviewed.  Current Medication List reviewed.    LOS: 1 day   [facility] [place] [facility] @ [facility] [phone]) 06/27/2013, 7:24 AM

## 2013-06-27 NOTE — Progress Notes (Signed)
INITIAL NUTRITION ASSESSMENT  DOCUMENTATION CODES Per approved criteria  -Non-severe (moderate) malnutrition in the context of acute illness or injury   INTERVENTION: No nutrition intervention at this time --- patient declined RD to follow for nutrition care plan  NUTRITION DIAGNOSIS: Inadequate protein-energy intake related to clear liquid diet as evidenced by diet provision of ~ 1,000 kcals, 0 gm protein  Goal: Pt to meet >/= 90% of their estimated nutrition needs   Monitor:  PO intake, weight, labs, I/O's  Reason for Assessment: Malnutrition Screening Tool Report  80 y.o. male  Admitting Dx: Anemia, blood loss  ASSESSMENT: Patient with PMH of PVD, HTN, A-fib, CKD, AAA and colon cancer; presented with 10 day hx of black stools and fatigue; evaluated by PCP and had CBC showing Hg of 6.3 -- asked to come to ER.  Patient reports his appetite is fair; decreased PTA with smaller portions at meals (on average was consuming 2 meals per day); pt reports a 10 lb weight loss x 1 month (weight records also reflect 9 lbs) -- equates to 5% which is significant for time frame; RD offered Resource Breeze supplement to help meet kcal, protein needs -- pt declines at this time.  Nutrition focused physical exam completed.  No muscle or subcutaneous fat depletion noticed.  Patient meets criteria for non-severe (moderate) malnutrition in the context of acute illness of injury as evidenced by < 75% intake of estimated energy requirement for > 7 days and 5% weight loss x 1 month.  Height: Ht Readings from Last 1 Encounters:  06/27/13 5\' 8"  (1.727 m)    Weight: Wt Readings from Last 1 Encounters:  06/27/13 166 lb 3.6 oz (75.4 kg)    Ideal Body Weight: 154 lb  % Ideal Body Weight: 107%  Wt Readings from Last 10 Encounters:  06/27/13 166 lb 3.6 oz (75.4 kg)  06/05/13 175 lb (79.379 kg)  02/02/13 180 lb (81.647 kg)    Usual Body Weight: 175 lb  % Usual Body Weight: 95%  BMI:  Body  mass index is 25.28 kg/(m^2).  Estimated Nutritional Needs: Kcal: 1850-2050 Protein: 90-100 gm Fluid: 1.9-2.1 L  Skin: Intact  Diet Order: Clear Liquid  EDUCATION NEEDS: -No education needs identified at this time   Intake/Output Summary (Last 24 hours) at 06/27/13 1415 Last data filed at 06/27/13 1400  Gross per 24 hour  Intake 1218.33 ml  Output    600 ml  Net 618.33 ml    Labs:   Recent Labs Lab 06/26/13 1939 06/27/13 0305  NA 136* 139  K 3.9 4.1  CL 99 102  CO2 21 20  BUN 91* 89*  CREATININE 2.78* 2.66*  CALCIUM 8.6 8.4  MG  --  2.3  PHOS  --  4.6  GLUCOSE 165* 113*    Scheduled Meds: . sodium chloride  500 mL Intravenous Once  . acetaminophen  650 mg Oral Once  . atorvastatin  40 mg Oral q1800  . diphenhydrAMINE  25 mg Oral Once  . docusate sodium  100 mg Oral BID  . famotidine  10 mg Oral q morning - 10a  . febuxostat  40 mg Oral q morning - 10a  . metoprolol tartrate  25 mg Oral BID  . [START ON 06/30/2013] pantoprazole (PROTONIX) IV  40 mg Intravenous Q12H  . sodium chloride  3 mL Intravenous Q12H  . sodium chloride  3 mL Intravenous Q12H    Continuous Infusions: . dextrose 5 % and 0.9% NaCl 1,000 mL (  06/27/13 0827)  . pantoprozole (PROTONIX) infusion 8 mg/hr (06/27/13 1400)    Past Medical History  Diagnosis Date  . Aortic valve disorder     s/p TAVR at [facility]   . PVD (peripheral vascular disease)   . Hypercholesteremia   . HTN (hypertension)   . Atrial fibrillation     Chronic  . Atrial flutter   . CKD (chronic kidney disease)   . AAA (abdominal aortic aneurysm)   . 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 CAD (coronary artery disease)     Status post CABG, stent to diagonal prior to TAVR  . Bladder cancer   . Gallstone   . Vitamin B12 deficiency   . Colon cancer   . Carotid stenosis   . Gout   . Gout     Past Surgical History  Procedure Laterality Date  . Coronary artery  bypass graft    . Laminectomy    . Appendectomy    . Colectomy    . Toe surgery    . Hemicolectomy    . Mohs surgery    . Angioplasty      Arthur Holms, RD, LDN Pager #: [phone] After-Hours Pager #: [phone]

## 2013-06-27 NOTE — Consult Note (Signed)
CARDIOLOGY CONSULT NOTE   Patient ID: [name] MRN: [id number], DOB/AGE: 1930/06/11   Admit date: 06/26/2013 Date of Consult: 06/27/2013   Primary Physician: Horton Finer, MD Primary Cardiologist: Dr. Irish Lack  Pt. Profile  This 80 year old gentleman with chronic atrial fibrillation on Coumadin was admitted yesterday with hemoglobin of 6 and a history of recent melena.  Problem List  Past Medical History  Diagnosis Date  . Aortic valve disorder     s/p TAVR at [facility]   . PVD (peripheral vascular disease)   . Hypercholesteremia   . HTN (hypertension)   . Atrial fibrillation     Chronic  . Atrial flutter   . CKD (chronic kidney disease)   . AAA (abdominal aortic aneurysm)   . 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 CAD (coronary artery disease)     Status post CABG, stent to diagonal prior to TAVR  . Bladder cancer   . Gallstone   . Vitamin B12 deficiency   . Colon cancer   . Carotid stenosis   . Gout   . Gout     Past Surgical History  Procedure Laterality Date  . Coronary artery bypass graft    . Laminectomy    . Appendectomy    . Colectomy    . Toe surgery    . Hemicolectomy    . Mohs surgery    . Angioplasty       Allergies  Allergies  Allergen Reactions  . Ramipril Other (See Comments)    unknown    HPI   This 80 year old gentleman has a past history of ischemic heart disease and valvular heart disease.  He has known coronary disease and has had prior stents most recently in January of 2012.  He has not been experiencing any recurrent chest pain or angina.  The patient also has a history of severe aortic stenosis and on 09/09/10 underwent TAVR at [facility] as part of the TAVR study.  His last echocardiogram was done at [facility] on 08/11/12 and showed normal left ventricular function with left ventricular hypertrophy.  The prosthetic aortic valve was working well.  There was no significant stenosis.   Peak systolic gradient across the aortic valve was 19 with mean gradient of 9.  There was mild aortic regurgitation, mild mitral stenosis, mild pulmonic regurgitation, and trivial tricuspid regurgitation.  The patient has a long history of atrial fibrillation.  He has had prior unsuccessful cardioversions.  He has also had atrial fibrillation ablation at [facility] years ago by Dr. Ola Spurr.  As noted he remains in chronic atrial fibrillation on Coumadin.  Generally his INRs are in the therapeutic range of 2.0-3.0.  However yesterday on admission his INR was 4. 17 and today is 4.05.  His previous monthly INRs in December, at January, and February were all in the therapeutic range.  The patient has not been experiencing any pain but approximately 8 or 9 days ago began to note black tarry stools.  He went to see his PCP Dr. Maxwell Caul yesterday and a CBC was obtained.  His hemoglobin was found to be 6.3 on arrival to the emergency room last night and this morning after transfusion of 2 units of packed cells hemoglobin is 7.3.  GI consultation is pending.  The patient does have a past history of colon cancer.  He does not have any history of known peptic ulcer disease.  The patient also has a past history of bladder  cancer and has a neobladder created by Dr. Risa Grill.   Inpatient Medications  . sodium chloride  500 mL Intravenous Once  . acetaminophen  650 mg Oral Once  . atorvastatin  40 mg Oral q1800  . diphenhydrAMINE  25 mg Oral Once  . docusate sodium  100 mg Oral BID  . famotidine  10 mg Oral q morning - 10a  . febuxostat  40 mg Oral q morning - 10a  . metoprolol tartrate  25 mg Oral BID  . [START ON 06/30/2013] pantoprazole (PROTONIX) IV  40 mg Intravenous Q12H  . sodium chloride  3 mL Intravenous Q12H  . sodium chloride  3 mL Intravenous Q12H    Family History Family History  Problem Relation Age of Onset  . Cancer Mother     Colon cancer  . Heart disease Father   . Diabetes Father     . Cancer Brother     esophageal     Social History History   Social History  . Marital Status: Married    Spouse Name: N/A    Number of Children: 3  . Years of Education: college   Occupational History  . RETIRED    Social History Main Topics  . Smoking status: Former Research scientist (life sciences)  . Smokeless tobacco: Never Used  . Alcohol Use: Yes     Comment: occasional  . Drug Use: No  . Sexual Activity: Not on file   Other Topics Concern  . Not on file   Social History Narrative  . No narrative on file     Review of Systems  General:  The patient has noted progressive fatigue over the past several weeks. Cardiovascular:  No chest pain, dyspnea on exertion, edema, orthopnea, palpitations, paroxysmal nocturnal dyspnea. Dermatological: No rash, lesions/masses Respiratory: No cough, dyspnea Urologic: No hematuria, dysuria Abdominal:   No nausea, vomiting, diarrhea, bright red blood per rectum,  or hematemesis.  The patient has noted black tarry stools for the past 9 days Neurologic:  No visual changes, wkns, changes in mental status. All other systems reviewed and are otherwise negative except as noted above.  Physical Exam  Blood pressure 126/62, pulse 77, temperature 97.6 F (36.4 C), temperature source Oral, resp. rate 24, height 5\' 8"  (1.727 m), weight 166 lb 3.6 oz (75.4 kg), SpO2 96.00%.  General: Pleasant, NAD Psych: Normal affect. Neuro: Alert and oriented X 3. Moves all extremities spontaneously. HEENT: Normal  Neck: Supple without bruits.  The jugular venous pressure is mildly increased. Lungs:  Resp regular and unlabored, CTA. Heart: Irregularly irregular rhythm.  Grade 2/6 systolic ejection murmur at the base. Abdomen: Soft, non-tender, non-distended, BS + x 4.  Extremities: No clubbing, cyanosis or edema. DP/PT/Radials 2+ and equal bilaterally.  Labs   Recent Labs  06/27/13 0305 06/27/13 0700  TROPONINI <0.30 <0.30   Lab Results  Component Value Date   WBC  9.9 06/27/2013   HGB 7.3* 06/27/2013   HCT 21.5* 06/27/2013   MCV 95.1 06/27/2013   PLT 250 06/27/2013     Recent Labs Lab 06/27/13 0305  NA 139  K 4.1  CL 102  CO2 20  BUN 89*  CREATININE 2.66*  CALCIUM 8.4  PROT 6.0  BILITOT 0.6  ALKPHOS 96  ALT 13  AST 17  GLUCOSE 113*   No results found for this basename: CHOL,  HDL,  LDLCALC,  TRIG   No results found for this basename: DDIMER    Radiology/Studies  No results found.  ECG Atrial fibrillation with premature ventricular or aberrantly conducted complexes Right bundle branch block Cannot rule out Inferior infarct , age undetermined   ASSESSMENT AND PLAN 1. severe anemia secondary to GI blood loss 2. chronic warfarin therapy for atrial fibrillation. 3. permanent atrial fibrillation status post atrial fibrillation ablation at [place] score is at least 4. 4. ischemic heart disease status post artery stents most recently in January 2012.  No recurrent angina pectoris 5. Hypertension 6. status post TAVR at [facility] on 09/09/10.  Good aortic valve function.  Plan: He is on warfarin for his atrial fibrillation.  This is now being held because of his active GI bleed.  GI workup to localize the source of bleeding is pending.  Dr. Florene Glen lab we will do EGD after his INR has dropped to below 2.  His INR is being gently corrected.  We'll check a chest x-ray.  Will follow with you.    Signed, Darlin Coco, MD  06/27/2013, 10:42 AM

## 2013-06-27 NOTE — Consult Note (Signed)
[facility] Gastroenterology Consultation Note  Referring Provider: Dr. Nehemiah Settle [facility]) Primary Care Physician:  Horton Finer, MD Primary Gastroenterologist:  Dr. Earle Gell  Reason for Consultation:  Melena, anemia  HPI: [name] is a 80 y.o. male admitted for, and whom we've been asked to see for, melena.  Started about 10 days ago, 1-2 episodes per day.  No abdominal pain, hematemesis, hematochezia.  No change in bowel habits.  Has been progressively weak over the past few weeks.  Thinks his appetite has diminished, some subjective weight loss.  Is on warfarin for atrial fibrillation and bioprosthetic aortic valve, admission INR ~ 4.0.  No prior endoscopy.  Colonoscopy in 2011 by Dr. Wynetta Emery showed left-sided diverticulosis, and several medium-sized adenomatous polyps, with one year colon recall suggested, which patient never had done.   Past Medical History  Diagnosis Date  . Aortic valve disorder     s/p TAVR at [facility]   . PVD (peripheral vascular disease)   . Hypercholesteremia   . HTN (hypertension)   . Atrial fibrillation     Chronic  . Atrial flutter   . CKD (chronic kidney disease)   . AAA (abdominal aortic aneurysm)   . 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 CAD (coronary artery disease)     Status post CABG, stent to diagonal prior to TAVR  . Bladder cancer   . Gallstone   . Vitamin B12 deficiency   . Colon cancer   . Carotid stenosis   . Gout   . Gout     Past Surgical History  Procedure Laterality Date  . Coronary artery bypass graft    . Laminectomy    . Appendectomy    . Colectomy    . Toe surgery    . Hemicolectomy    . Mohs surgery    . Angioplasty      Prior to Admission medications   Medication Sig Start Date End Date Taking? Authorizing Provider  albuterol (PROVENTIL HFA;VENTOLIN HFA) 108 (90 BASE) MCG/ACT inhaler Inhale 2 puffs into the lungs every 6 (six) hours as  needed for wheezing or shortness of breath.   Yes Historical Provider, MD  aspirin 81 MG tablet Take 81 mg by mouth every morning.   Yes Historical Provider, MD  atorvastatin (LIPITOR) 40 MG tablet Take 40 mg by mouth daily at 6 PM.  11/09/11  Yes Historical Provider, MD  citric acid-sodium citrate (ORACIT) 334-500 MG/5ML solution Take 15 mLs by mouth every morning. 1 Tablespoon Daily   Yes Historical Provider, MD  colchicine 0.6 MG tablet Take 0.6 mg by mouth daily as needed (for gout).   Yes Historical Provider, MD  Cyanocobalamin (VITAMIN B-12 IJ) Inject 1 each as directed every 30 (thirty) days.   Yes Historical Provider, MD  famotidine (TH FAMOTIDINE 10) 10 MG tablet Take 10 mg by mouth every morning.   Yes Historical Provider, MD  febuxostat (ULORIC) 40 MG tablet Take 40 mg by mouth every morning.   Yes Historical Provider, MD  furosemide (LASIX) 40 MG tablet Take 40 mg by mouth every morning.   Yes Historical Provider, MD  metolazone (ZAROXOLYN) 2.5 MG tablet Take 2.5 mg by mouth daily as needed (for fluid).   Yes Historical Provider, MD  metoprolol tartrate (LOPRESSOR) 25 MG tablet Take 50 mg by mouth every morning.   Yes Historical Provider, MD  Multiple Vitamins-Iron (MULTIVITAMINS WITH IRON) TABS tablet Take 1 tablet by mouth every evening.  Yes Historical Provider, MD  warfarin (COUMADIN) 5 MG tablet Take 5 mg by mouth every morning. Takes 5mg  on  Monday and Friday . Takes 2.5mg  all other days   Yes Historical Provider, MD    Current Facility-Administered Medications  Medication Dose Route Frequency Provider Last Rate Last Dose  . 0.9 %  sodium chloride infusion  500 mL Intravenous Once Anastassia Doutova, MD      . 0.9 %  sodium chloride infusion  250 mL Intravenous PRN Toy Baker, MD      . acetaminophen (TYLENOL) tablet 650 mg  650 mg Oral Q6H PRN Toy Baker, MD       Or  . acetaminophen (TYLENOL) suppository 650 mg  650 mg Rectal Q6H PRN Toy Baker, MD       . acetaminophen (TYLENOL) tablet 650 mg  650 mg Oral Once Toy Baker, MD      . albuterol (PROVENTIL) (2.5 MG/3ML) 0.083% nebulizer solution 3 mL  3 mL Inhalation Q6H PRN Toy Baker, MD      . atorvastatin (LIPITOR) tablet 40 mg  40 mg Oral q1800 Toy Baker, MD      . colchicine tablet 0.6 mg  0.6 mg Oral Daily PRN Toy Baker, MD      . dextrose 5 %-0.9 % sodium chloride infusion   Intravenous Continuous Horton Finer, MD 75 mL/hr at 06/27/13 0827 1,000 mL at 06/27/13 0827  . diphenhydrAMINE (BENADRYL) capsule 25 mg  25 mg Oral Once Toy Baker, MD      . docusate sodium (COLACE) capsule 100 mg  100 mg Oral BID Toy Baker, MD   100 mg at 06/27/13 1044  . famotidine (PEPCID) tablet 10 mg  10 mg Oral q morning - 10a Toy Baker, MD   10 mg at 06/27/13 1044  . febuxostat (ULORIC) tablet 40 mg  40 mg Oral q morning - 10a Toy Baker, MD   40 mg at 06/27/13 1044  . HYDROcodone-acetaminophen (NORCO/VICODIN) 5-325 MG per tablet 1-2 tablet  1-2 tablet Oral Q4H PRN Toy Baker, MD      . metoprolol tartrate (LOPRESSOR) tablet 25 mg  25 mg Oral BID Toy Baker, MD   25 mg at 06/27/13 1044  . ondansetron (ZOFRAN) tablet 4 mg  4 mg Oral Q6H PRN Toy Baker, MD       Or  . ondansetron (ZOFRAN) injection 4 mg  4 mg Intravenous Q6H PRN Toy Baker, MD      . pantoprazole (PROTONIX) 80 mg in sodium chloride 0.9 % 250 mL infusion  8 mg/hr Intravenous Continuous Jasper Riling. Pickering, MD 25 mL/hr at 06/27/13 1400 8 mg/hr at 06/27/13 1400  . [START ON 06/30/2013] pantoprazole (PROTONIX) injection 40 mg  40 mg Intravenous Q12H Nathan R. Pickering, MD      . sodium chloride 0.9 % injection 3 mL  3 mL Intravenous Q12H Anastassia Doutova, MD      . sodium chloride 0.9 % injection 3 mL  3 mL Intravenous Q12H Toy Baker, MD   3 mL at 06/27/13 1045  . sodium chloride 0.9 % injection 3 mL  3 mL Intravenous PRN Toy Baker, MD        Allergies as of 06/26/2013 - Review Complete 06/26/2013  Allergen Reaction Noted  . Ramipril Other (See Comments) 02/02/2013    Family History  Problem Relation Age of Onset  . Cancer Mother     Colon cancer  . Heart disease Father   . Diabetes  Father   . Cancer Brother     esophageal    History   Social History  . Marital Status: Married    Spouse Name: N/A    Number of Children: 3  . Years of Education: college   Occupational History  . RETIRED    Social History Main Topics  . Smoking status: Former Research scientist (life sciences)  . Smokeless tobacco: Never Used  . Alcohol Use: Yes     Comment: occasional  . Drug Use: No  . Sexual Activity: Not on file   Other Topics Concern  . Not on file   Social History Narrative  . No narrative on file    Review of Systems: ROS Dr. Roel Cluck 06/26/13 reviewed and I agree  Physical Exam: Vital signs in last 24 hours: Temp:  [97.6 F (36.4 C)-99.6 F (37.6 C)] 99.6 F (37.6 C) (03/10 1606) Pulse Rate:  [63-117] 80 (03/10 1606) Resp:  [13-26] 19 (03/10 1606) BP: (105-142)/(31-104) 131/38 mmHg (03/10 1606) SpO2:  [92 %-100 %] 98 % (03/10 1606) Weight:  [75.4 kg (166 lb 3.6 oz)-76.839 kg (169 lb 6.4 oz)] 75.4 kg (166 lb 3.6 oz) (03/10 0030) Last BM Date: 06/26/13 General:   Alert,  Well-developed, well-nourished, pleasant and cooperative in NAD Head:  Normocephalic and atraumatic. Eyes:  Sclera clear, no icterus.   Conjunctiva pale Ears:  Normal auditory acuity. Nose:  No deformity, discharge,  or lesions. Mouth:  No deformity or lesions.  Oropharynx pink but somewhat dry Neck:  Supple; no masses or thyromegaly. Lungs:  Clear throughout to auscultation.   No wheezes, crackles, or rhonchi. No acute distress. Heart:  Irregularly irrregular rate and rhythm; II/VI SEM LUSB, no clicks, rubs,  or gallops. Abdomen:  Soft, nontender and nondistended. No masses, hepatosplenomegaly or hernias noted. Normal bowel sounds, without  guarding, and without rebound.     Msk:  Diffuse muscular atrophy, but symmetrical without gross deformities. Normal posture. Pulses:  Normal pulses noted. Extremities:  Without clubbing or edema. Neurologic:  Alert and  oriented x4;  Diffusely weak, otherwise grossly normal neurologically. Skin:  Scattered ecchymoses, otherwise intact without significant lesions or rashes. Psych:  Alert and cooperative. Normal mood and affect.   Lab Results:  Recent Labs  06/26/13 1939 06/27/13 0305 06/27/13 1228  WBC 9.6 9.9 11.7*  HGB 6.3* 7.3* 8.9*  HCT 19.2* 21.5* 26.2*  PLT 283 250 249   BMET  Recent Labs  06/26/13 1939 06/27/13 0305  NA 136* 139  K 3.9 4.1  CL 99 102  CO2 21 20  GLUCOSE 165* 113*  BUN 91* 89*  CREATININE 2.78* 2.66*  CALCIUM 8.6 8.4   LFT  Recent Labs  06/27/13 0305  PROT 6.0  ALBUMIN 2.9*  AST 17  ALT 13  ALKPHOS 96  BILITOT 0.6   PT/INR  Recent Labs  06/26/13 1939 06/27/13 0305  LABPROT 38.7* 37.8*  INR 4.17* 4.05*    Studies/Results: Dg Chest Port 1 View  06/27/2013   CLINICAL DATA Atrial fibrillation, history of TAVR that GI bleed.  EXAM PORTABLE CHEST - 1 VIEW  COMPARISON DG CHEST 2V dated 09/05/2012; DG CHEST 2V dated 09/24/2010; DG CHEST 2V dated 05/13/2010  FINDINGS Transcatheter aortic valve visualized. There is stable mild cardiomegaly. Lungs show chronic disease and interstitial prominence without overt airspace edema or pleural fluid. There may be a component of mild pulmonary venous hypertension/ early interstitial edema. No focal consolidation is identified.  IMPRESSION Stable chronic lung disease. There may be a  mild component of pulmonary venous hypertension/early interstitial edema.  SIGNATURE  Electronically Signed   By: Aletta Edouard M.D.   On: 06/27/2013 14:07   Impression:  1.  Melena.  Hemodynamically stable, subacute (10 days) duration. 2.  Acute blood loss anemia. 3.  Anticoagulation in setting of atrial  fibrillation.  Plan:  1.  Continue pantoprazole. 2.  Check serial CBCs. 3.  EGD in am, if INR </= 2.0 4.  Risks (bleeding, infection, bowel perforation that could require surgery, sedation-related changes in cardiopulmonary systems), benefits (identification and possible treatment of source of symptoms, exclusion of certain causes of symptoms), and alternatives (watchful waiting, radiographic imaging studies, empiric medical treatment) of upper endoscopy (EGD) were explained to patient/family in detail and patient wishes to proceed. 5.  If endoscopy is unrevealing, would likely need colonoscopy as next step in management. 6.  Holding anticoagulation, pending GI tract evaluation.   LOS: 1 day   Sherley Mckenney M  06/27/2013, 4:40 PM

## 2013-06-28 ENCOUNTER — Encounter (HOSPITAL_COMMUNITY)
Admission: EM | Disposition: A | Discharge status: Home Health | Payer: Self-pay | Source: Home / Self Care | Attending: Internal Medicine

## 2013-06-28 ENCOUNTER — Encounter (HOSPITAL_COMMUNITY): Payer: Self-pay | Admitting: *Deleted

## 2013-06-28 ENCOUNTER — Encounter (HOSPITAL_COMMUNITY): Payer: Medicare Other

## 2013-06-28 HISTORY — PX: ESOPHAGOGASTRODUODENOSCOPY: SHX5428

## 2013-06-28 LAB — TYPE AND SCREEN
ABO/RH(D): O POS
Antibody Screen: NEGATIVE
UNIT DIVISION: 0
Unit division: 0

## 2013-06-28 LAB — BASIC METABOLIC PANEL
BUN: 63 mg/dL — ABNORMAL HIGH (ref 6–23)
CO2: 21 mEq/L (ref 19–32)
Calcium: 8.6 mg/dL (ref 8.4–10.5)
Chloride: 105 mEq/L (ref 96–112)
Creatinine, Ser: 2.3 mg/dL — ABNORMAL HIGH (ref 0.50–1.35)
GFR calc Af Amer: 29 mL/min — ABNORMAL LOW (ref >90)
GFR calc non Af Amer: 25 mL/min — ABNORMAL LOW (ref >90)
Glucose, Bld: 126 mg/dL — ABNORMAL HIGH (ref 70–99)
Potassium: 3.6 mEq/L — ABNORMAL LOW (ref 3.7–5.3)
Sodium: 141 mEq/L (ref 137–147)

## 2013-06-28 LAB — CBC
HCT: 26.7 % — ABNORMAL LOW (ref 39.0–52.0)
Hemoglobin: 8.8 g/dL — ABNORMAL LOW (ref 13.0–17.0)
MCH: 31.2 pg (ref 26.0–34.0)
MCHC: 33 g/dL (ref 30.0–36.0)
MCV: 94.7 fL (ref 78.0–100.0)
Platelets: 276 10*3/uL (ref 150–400)
RBC: 2.82 MIL/uL — ABNORMAL LOW (ref 4.22–5.81)
RDW: 19.9 % — ABNORMAL HIGH (ref 11.5–15.5)
WBC: 9.7 10*3/uL (ref 4.0–10.5)

## 2013-06-28 LAB — PROTIME-INR
INR: 2.59 — ABNORMAL HIGH (ref 0.00–1.49)
Prothrombin Time: 26.9 seconds — ABNORMAL HIGH (ref 11.6–15.2)

## 2013-06-28 SURGERY — EGD (ESOPHAGOGASTRODUODENOSCOPY)
Anesthesia: Moderate Sedation

## 2013-06-28 MED ORDER — FENTANYL CITRATE 0.05 MG/ML IJ SOLN
INTRAMUSCULAR | Status: AC
  Filled 2013-06-28: qty 2

## 2013-06-28 MED ORDER — MIDAZOLAM HCL 5 MG/ML IJ SOLN
INTRAMUSCULAR | Status: AC
  Filled 2013-06-28: qty 1

## 2013-06-28 MED ORDER — POLYETHYLENE GLYCOL 3350 17 G PO PACK
17.0000 g | PACK | Freq: Three times a day (TID) | ORAL | Status: DC
  Administered 2013-06-29: 17 g via ORAL
  Filled 2013-06-28 (×6): qty 1

## 2013-06-28 MED ORDER — BUTAMBEN-TETRACAINE-BENZOCAINE 2-2-14 % EX AERO
INHALATION_SPRAY | CUTANEOUS | Status: DC | PRN
  Administered 2013-06-28: 1 via TOPICAL

## 2013-06-28 MED ORDER — MIDAZOLAM HCL 10 MG/2ML IJ SOLN
INTRAMUSCULAR | Status: DC | PRN
  Administered 2013-06-28: 2 mg via INTRAVENOUS

## 2013-06-28 MED ORDER — FENTANYL CITRATE 0.05 MG/ML IJ SOLN
INTRAMUSCULAR | Status: DC | PRN
  Administered 2013-06-28: 25 ug via INTRAVENOUS

## 2013-06-28 MED ORDER — SODIUM CHLORIDE 0.9 % IV SOLN
INTRAVENOUS | Status: DC
  Administered 2013-06-28: 500 mL via INTRAVENOUS

## 2013-06-28 NOTE — Clinical Documentation Improvement (Signed)
Patient presents with GIB with acute blood loss anemia (GI Consult).   Patient's Creatinine has decreased from 2.78 (admission draw) to 2.30 (drawn on 3/11).  Please clarify if the patient has any of the following conditions and document findings in next progress note and discharge summary.    Acute Renal Failure/Acute Kidney Injury Acute on Chronic Renal Failure Chronic Renal Failure Other Condition    Thank You, Zoila Shutter ,RN Clinical Documentation Specialist:  [phone]; Cell [phone]  [place] Information Management

## 2013-06-28 NOTE — Op Note (Signed)
[facility] [place], [address]   ENDOSCOPY PROCEDURE REPORT  PATIENT: [name], [name]  MR#: [id number] BIRTHDATE: [birth year] , 82  yrs. old GENDER: Male ENDOSCOPIST:Masiah Woody Oletta Lamas, MD REFERRED BY:  Dr. Maxwell Caul PROCEDURE DATE:  06/28/2013 PROCEDURE:   EGD and biopsy ASA CLASS:   class III INDICATIONS:  G.I. bleeding from a gentleman on Coumadin for atrial fibrillation and mechanical heart valves. INR 2.5 today MEDICATION:   fentanyl 25 mcg, versed 2 mg IV TOPICAL ANESTHETIC:   cetacaine spray  DESCRIPTION OF PROCEDURE:   The Pentax upper endoscope was inserted into the esophagus with swallowing without difficulty. The esophagus was carefully examine and there was no sign of active bleeding. GE junction appeared normal with no esophagitis or varices. The stomach was entered and there was no ulceration or signs of recent or active bleeding. It was examined in the forward and retroflex view. The pyloric channel was normal. The duodenal bulb was seen well and was normal. The duodenum was seen well down to the 2nd /3rd portion and there was no active bleeding. In the 2nd portion of the duodenum was a 1/2 cm sessile polyp. This was not ulcerated and had no signs of recent bleeding. A single box via the polyp was obtain with minimal bleeding. The scope was withdrawn in the patient tolerated procedure well. There were no immediate complications.     COMPLICATIONS: None  ENDOSCOPIC IMPRESSION: 1. G.I. Bleeding. No obvious source of bleeding in upper endoscopy 2. Small Duodenal Polyp. No signs at this polyp is been the source of bleeding. A single biopsy was obtained.  RECOMMENDATIONS: 1. we'll go ahead and resume clear liquids. 2. Follow clinically. 3. He has had a history of previous: polyps and may need colonoscopy if his bleeding does not stop.    _______________________________ Lorrin MaisLaurence Spates, MD 06/28/2013 9:42  AM

## 2013-06-28 NOTE — H&P (View-Only) (Signed)
[facility] Gastroenterology Consultation Note  Referring Provider: Dr. Nehemiah Settle [facility]) Primary Care Physician:  Horton Finer, MD Primary Gastroenterologist:  Dr. Earle Gell  Reason for Consultation:  Melena, anemia  HPI: [name] is a 80 y.o. male admitted for, and whom we've been asked to see for, melena.  Started about 10 days ago, 1-2 episodes per day.  No abdominal pain, hematemesis, hematochezia.  No change in bowel habits.  Has been progressively weak over the past few weeks.  Thinks his appetite has diminished, some subjective weight loss.  Is on warfarin for atrial fibrillation and bioprosthetic aortic valve, admission INR ~ 4.0.  No prior endoscopy.  Colonoscopy in 2011 by Dr. Wynetta Emery showed left-sided diverticulosis, and several medium-sized adenomatous polyps, with one year colon recall suggested, which patient never had done.   Past Medical History  Diagnosis Date  . Aortic valve disorder     s/p TAVR at [facility]   . PVD (peripheral vascular disease)   . Hypercholesteremia   . HTN (hypertension)   . Atrial fibrillation     Chronic  . Atrial flutter   . CKD (chronic kidney disease)   . AAA (abdominal aortic aneurysm)   . 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 CAD (coronary artery disease)     Status post CABG, stent to diagonal prior to TAVR  . Bladder cancer   . Gallstone   . Vitamin B12 deficiency   . Colon cancer   . Carotid stenosis   . Gout   . Gout     Past Surgical History  Procedure Laterality Date  . Coronary artery bypass graft    . Laminectomy    . Appendectomy    . Colectomy    . Toe surgery    . Hemicolectomy    . Mohs surgery    . Angioplasty      Prior to Admission medications   Medication Sig Start Date End Date Taking? Authorizing Provider  albuterol (PROVENTIL HFA;VENTOLIN HFA) 108 (90 BASE) MCG/ACT inhaler Inhale 2 puffs into the lungs every 6 (six) hours as  needed for wheezing or shortness of breath.   Yes Historical Provider, MD  aspirin 81 MG tablet Take 81 mg by mouth every morning.   Yes Historical Provider, MD  atorvastatin (LIPITOR) 40 MG tablet Take 40 mg by mouth daily at 6 PM.  11/09/11  Yes Historical Provider, MD  citric acid-sodium citrate (ORACIT) 334-500 MG/5ML solution Take 15 mLs by mouth every morning. 1 Tablespoon Daily   Yes Historical Provider, MD  colchicine 0.6 MG tablet Take 0.6 mg by mouth daily as needed (for gout).   Yes Historical Provider, MD  Cyanocobalamin (VITAMIN B-12 IJ) Inject 1 each as directed every 30 (thirty) days.   Yes Historical Provider, MD  famotidine (TH FAMOTIDINE 10) 10 MG tablet Take 10 mg by mouth every morning.   Yes Historical Provider, MD  febuxostat (ULORIC) 40 MG tablet Take 40 mg by mouth every morning.   Yes Historical Provider, MD  furosemide (LASIX) 40 MG tablet Take 40 mg by mouth every morning.   Yes Historical Provider, MD  metolazone (ZAROXOLYN) 2.5 MG tablet Take 2.5 mg by mouth daily as needed (for fluid).   Yes Historical Provider, MD  metoprolol tartrate (LOPRESSOR) 25 MG tablet Take 50 mg by mouth every morning.   Yes Historical Provider, MD  Multiple Vitamins-Iron (MULTIVITAMINS WITH IRON) TABS tablet Take 1 tablet by mouth every evening.  Yes Historical Provider, MD  warfarin (COUMADIN) 5 MG tablet Take 5 mg by mouth every morning. Takes 5mg  on  Monday and Friday . Takes 2.5mg  all other days   Yes Historical Provider, MD    Current Facility-Administered Medications  Medication Dose Route Frequency Provider Last Rate Last Dose  . 0.9 %  sodium chloride infusion  500 mL Intravenous Once Anastassia Doutova, MD      . 0.9 %  sodium chloride infusion  250 mL Intravenous PRN Toy Baker, MD      . acetaminophen (TYLENOL) tablet 650 mg  650 mg Oral Q6H PRN Toy Baker, MD       Or  . acetaminophen (TYLENOL) suppository 650 mg  650 mg Rectal Q6H PRN Toy Baker, MD       . acetaminophen (TYLENOL) tablet 650 mg  650 mg Oral Once Toy Baker, MD      . albuterol (PROVENTIL) (2.5 MG/3ML) 0.083% nebulizer solution 3 mL  3 mL Inhalation Q6H PRN Toy Baker, MD      . atorvastatin (LIPITOR) tablet 40 mg  40 mg Oral q1800 Toy Baker, MD      . colchicine tablet 0.6 mg  0.6 mg Oral Daily PRN Toy Baker, MD      . dextrose 5 %-0.9 % sodium chloride infusion   Intravenous Continuous Horton Finer, MD 75 mL/hr at 06/27/13 0827 1,000 mL at 06/27/13 0827  . diphenhydrAMINE (BENADRYL) capsule 25 mg  25 mg Oral Once Toy Baker, MD      . docusate sodium (COLACE) capsule 100 mg  100 mg Oral BID Toy Baker, MD   100 mg at 06/27/13 1044  . famotidine (PEPCID) tablet 10 mg  10 mg Oral q morning - 10a Toy Baker, MD   10 mg at 06/27/13 1044  . febuxostat (ULORIC) tablet 40 mg  40 mg Oral q morning - 10a Toy Baker, MD   40 mg at 06/27/13 1044  . HYDROcodone-acetaminophen (NORCO/VICODIN) 5-325 MG per tablet 1-2 tablet  1-2 tablet Oral Q4H PRN Toy Baker, MD      . metoprolol tartrate (LOPRESSOR) tablet 25 mg  25 mg Oral BID Toy Baker, MD   25 mg at 06/27/13 1044  . ondansetron (ZOFRAN) tablet 4 mg  4 mg Oral Q6H PRN Toy Baker, MD       Or  . ondansetron (ZOFRAN) injection 4 mg  4 mg Intravenous Q6H PRN Toy Baker, MD      . pantoprazole (PROTONIX) 80 mg in sodium chloride 0.9 % 250 mL infusion  8 mg/hr Intravenous Continuous Jasper Riling. Pickering, MD 25 mL/hr at 06/27/13 1400 8 mg/hr at 06/27/13 1400  . [START ON 06/30/2013] pantoprazole (PROTONIX) injection 40 mg  40 mg Intravenous Q12H Nathan R. Pickering, MD      . sodium chloride 0.9 % injection 3 mL  3 mL Intravenous Q12H Anastassia Doutova, MD      . sodium chloride 0.9 % injection 3 mL  3 mL Intravenous Q12H Toy Baker, MD   3 mL at 06/27/13 1045  . sodium chloride 0.9 % injection 3 mL  3 mL Intravenous PRN Toy Baker, MD        Allergies as of 06/26/2013 - Review Complete 06/26/2013  Allergen Reaction Noted  . Ramipril Other (See Comments) 02/02/2013    Family History  Problem Relation Age of Onset  . Cancer Mother     Colon cancer  . Heart disease Father   . Diabetes  Father   . Cancer Brother     esophageal    History   Social History  . Marital Status: Married    Spouse Name: N/A    Number of Children: 3  . Years of Education: college   Occupational History  . RETIRED    Social History Main Topics  . Smoking status: Former Research scientist (life sciences)  . Smokeless tobacco: Never Used  . Alcohol Use: Yes     Comment: occasional  . Drug Use: No  . Sexual Activity: Not on file   Other Topics Concern  . Not on file   Social History Narrative  . No narrative on file    Review of Systems: ROS Dr. Roel Cluck 06/26/13 reviewed and I agree  Physical Exam: Vital signs in last 24 hours: Temp:  [97.6 F (36.4 C)-99.6 F (37.6 C)] 99.6 F (37.6 C) (03/10 1606) Pulse Rate:  [63-117] 80 (03/10 1606) Resp:  [13-26] 19 (03/10 1606) BP: (105-142)/(31-104) 131/38 mmHg (03/10 1606) SpO2:  [92 %-100 %] 98 % (03/10 1606) Weight:  [75.4 kg (166 lb 3.6 oz)-76.839 kg (169 lb 6.4 oz)] 75.4 kg (166 lb 3.6 oz) (03/10 0030) Last BM Date: 06/26/13 General:   Alert,  Well-developed, well-nourished, pleasant and cooperative in NAD Head:  Normocephalic and atraumatic. Eyes:  Sclera clear, no icterus.   Conjunctiva pale Ears:  Normal auditory acuity. Nose:  No deformity, discharge,  or lesions. Mouth:  No deformity or lesions.  Oropharynx pink but somewhat dry Neck:  Supple; no masses or thyromegaly. Lungs:  Clear throughout to auscultation.   No wheezes, crackles, or rhonchi. No acute distress. Heart:  Irregularly irrregular rate and rhythm; II/VI SEM LUSB, no clicks, rubs,  or gallops. Abdomen:  Soft, nontender and nondistended. No masses, hepatosplenomegaly or hernias noted. Normal bowel sounds, without  guarding, and without rebound.     Msk:  Diffuse muscular atrophy, but symmetrical without gross deformities. Normal posture. Pulses:  Normal pulses noted. Extremities:  Without clubbing or edema. Neurologic:  Alert and  oriented x4;  Diffusely weak, otherwise grossly normal neurologically. Skin:  Scattered ecchymoses, otherwise intact without significant lesions or rashes. Psych:  Alert and cooperative. Normal mood and affect.   Lab Results:  Recent Labs  06/26/13 1939 06/27/13 0305 06/27/13 1228  WBC 9.6 9.9 11.7*  HGB 6.3* 7.3* 8.9*  HCT 19.2* 21.5* 26.2*  PLT 283 250 249   BMET  Recent Labs  06/26/13 1939 06/27/13 0305  NA 136* 139  K 3.9 4.1  CL 99 102  CO2 21 20  GLUCOSE 165* 113*  BUN 91* 89*  CREATININE 2.78* 2.66*  CALCIUM 8.6 8.4   LFT  Recent Labs  06/27/13 0305  PROT 6.0  ALBUMIN 2.9*  AST 17  ALT 13  ALKPHOS 96  BILITOT 0.6   PT/INR  Recent Labs  06/26/13 1939 06/27/13 0305  LABPROT 38.7* 37.8*  INR 4.17* 4.05*    Studies/Results: Dg Chest Port 1 View  06/27/2013   CLINICAL DATA Atrial fibrillation, history of TAVR that GI bleed.  EXAM PORTABLE CHEST - 1 VIEW  COMPARISON DG CHEST 2V dated 09/05/2012; DG CHEST 2V dated 09/24/2010; DG CHEST 2V dated 05/13/2010  FINDINGS Transcatheter aortic valve visualized. There is stable mild cardiomegaly. Lungs show chronic disease and interstitial prominence without overt airspace edema or pleural fluid. There may be a component of mild pulmonary venous hypertension/ early interstitial edema. No focal consolidation is identified.  IMPRESSION Stable chronic lung disease. There may be a  mild component of pulmonary venous hypertension/early interstitial edema.  SIGNATURE  Electronically Signed   By: Aletta Edouard M.D.   On: 06/27/2013 14:07   Impression:  1.  Melena.  Hemodynamically stable, subacute (10 days) duration. 2.  Acute blood loss anemia. 3.  Anticoagulation in setting of atrial  fibrillation.  Plan:  1.  Continue pantoprazole. 2.  Check serial CBCs. 3.  EGD in am, if INR </= 2.0 4.  Risks (bleeding, infection, bowel perforation that could require surgery, sedation-related changes in cardiopulmonary systems), benefits (identification and possible treatment of source of symptoms, exclusion of certain causes of symptoms), and alternatives (watchful waiting, radiographic imaging studies, empiric medical treatment) of upper endoscopy (EGD) were explained to patient/family in detail and patient wishes to proceed. 5.  If endoscopy is unrevealing, would likely need colonoscopy as next step in management. 6.  Holding anticoagulation, pending GI tract evaluation.   LOS: 1 day   Lois Slagel M  06/27/2013, 4:40 PM

## 2013-06-28 NOTE — Progress Notes (Signed)
Assessment/Plan: Principal Problem:   Anemia, blood loss - labs in process. Hopefully, INR < 2 today so can undergo EGD. Appreciated Dr. Erlinda Hong help and observation re: colonoscopy.  Active Problems:   CKD (chronic kidney disease)   Aortic valve disorder - s/p TAVR. Appreciate Dr. Mare Ferrari seeing patient. I am not familiar with longer term issues with TAVR if any.    HTN (hypertension)   Atrial fibrillation - stable.    Coronary atherosclerosis of native coronary artery   Upper GI bleed - presumed   Malnutrition of moderate degree - patient has lost weight over last few years and there is some temporal wasting. Will discuss further toward discharge.    Subjective: Feels okay. Still having some black but formed stool. No CP, dyspnea, abd pain.   Objective:  Vital Signs: Filed Vitals:   06/28/13 0300 06/28/13 0400 06/28/13 0500 06/28/13 0600  BP: 125/51 125/47 133/47 121/48  Pulse: 53 81    Temp:  98.4 F (36.9 C)    TempSrc:  Oral    Resp: 24 22 23 22   Height:      Weight:      SpO2: 93% 96%       EXAM: Awake and alert.    Intake/Output Summary (Last 24 hours) at 06/28/13 0629 Last data filed at 06/28/13 0600  Gross per 24 hour  Intake   1928 ml  Output   2100 ml  Net   -172 ml    Lab Results:  Recent Labs  06/26/13 1939 06/27/13 0305  NA 136* 139  K 3.9 4.1  CL 99 102  CO2 21 20  GLUCOSE 165* 113*  BUN 91* 89*  CREATININE 2.78* 2.66*  CALCIUM 8.6 8.4  MG  --  2.3  PHOS  --  4.6    Recent Labs  06/26/13 1939 06/27/13 0305  AST 20 17  ALT 16 13  ALKPHOS 99 96  BILITOT 0.5 0.6  PROT 6.5 6.0  ALBUMIN 3.0* 2.9*   No results found for this basename: LIPASE, AMYLASE,  in the last 72 hours  Recent Labs  06/26/13 1939 06/27/13 0305 06/27/13 1228  WBC 9.6 9.9 11.7*  NEUTROABS 7.1  --   --   HGB 6.3* 7.3* 8.9*  HCT 19.2* 21.5* 26.2*  MCV 99.5 95.1 94.2  PLT 283 250 249    Recent Labs  06/27/13 0305 06/27/13 0700 06/27/13 1228   TROPONINI <0.30 <0.30 <0.30   BNP No results found for this basename: probnp   No results found for this basename: DDIMER,  in the last 72 hours No results found for this basename: HGBA1C,  in the last 72 hours No results found for this basename: CHOL, HDL, LDLCALC, TRIG, CHOLHDL, LDLDIRECT,  in the last 72 hours  Recent Labs  06/27/13 0305  TSH 1.482   No results found for this basename: VITAMINB12, FOLATE, FERRITIN, TIBC, IRON, RETICCTPCT,  in the last 72 hours  Studies/Results: Dg Chest Port 1 View  06/27/2013   CLINICAL DATA Atrial fibrillation, history of TAVR that GI bleed.  EXAM PORTABLE CHEST - 1 VIEW  COMPARISON DG CHEST 2V dated 09/05/2012; DG CHEST 2V dated 09/24/2010; DG CHEST 2V dated 05/13/2010  FINDINGS Transcatheter aortic valve visualized. There is stable mild cardiomegaly. Lungs show chronic disease and interstitial prominence without overt airspace edema or pleural fluid. There may be a component of mild pulmonary venous hypertension/ early interstitial edema. No focal consolidation is identified.  IMPRESSION Stable chronic lung disease. There  may be a mild component of pulmonary venous hypertension/early interstitial edema.  SIGNATURE  Electronically Signed   By: Aletta Edouard M.D.   On: 06/27/2013 14:07   Medications: Medications administered in the last 24 hours reviewed.  Current Medication List reviewed.    LOS: 2 days   [facility] Internal Medicine @ [facility] [phone]) 06/28/2013, 6:29 AM

## 2013-06-28 NOTE — Interval H&P Note (Signed)
History and Physical Interval Note:  06/28/2013 9:06 AM  [name]  has presented today for surgery, with the diagnosis of GIbleed  The various methods of treatment have been discussed with the patient and family. After consideration of risks, benefits and other options for treatment, the patient has consented to  Procedure(s): ESOPHAGOGASTRODUODENOSCOPY (EGD) (N/A) as a surgical intervention .  The patient's history has been reviewed, patient examined, no change in status, stable for surgery.  I have reviewed the patient's chart and labs.  Questions were answered to the patient's satisfaction.     Fallon Haecker JR,Reghan Thul L

## 2013-06-28 NOTE — Clinical Documentation Improvement (Signed)
Presents with GIB and acute blood loss anemia.   CKD is documented  Patient is a white male  GFR is ranging between 20 to 25  Please clarify the stage of CKD the patient has from list below and document findings in next progress note and discharge summary.  _______CKD Stage I - GFR > OR = 90 _______CKD Stage II - GFR 60-80 _______CKD Stage III - GFR 30-59 _______CKD Stage IV - GFR 15-29 _______CKD Stage V - GFR < 15 _______ESRD (End Stage Renal Disease) _______Other condition_____________    Angela Adam ,RN Clinical Documentation Specialist:  [phone]  [place] Information Management

## 2013-06-29 ENCOUNTER — Encounter (HOSPITAL_COMMUNITY): Payer: Self-pay | Admitting: Gastroenterology

## 2013-06-29 ENCOUNTER — Encounter (HOSPITAL_COMMUNITY): Payer: Medicare Other

## 2013-06-29 LAB — PROTIME-INR
INR: 2.12 — ABNORMAL HIGH (ref 0.00–1.49)
Prothrombin Time: 23.1 seconds — ABNORMAL HIGH (ref 11.6–15.2)

## 2013-06-29 MED ORDER — PEG 3350-KCL-NA BICARB-NACL 420 G PO SOLR
4000.0000 mL | Freq: Once | ORAL | Status: AC
  Administered 2013-06-29: 4000 mL via ORAL
  Filled 2013-06-29: qty 4000

## 2013-06-29 MED ORDER — SODIUM CHLORIDE 0.9 % IV SOLN
INTRAVENOUS | Status: DC
  Administered 2013-06-29: 11:00:00 via INTRAVENOUS

## 2013-06-29 MED ORDER — PANTOPRAZOLE SODIUM 40 MG PO TBEC
40.0000 mg | DELAYED_RELEASE_TABLET | Freq: Every day | ORAL | Status: DC
  Administered 2013-06-30: 40 mg via ORAL

## 2013-06-29 NOTE — Progress Notes (Signed)
Pt received into room 2w33, pt oriented to room, wife at bedside, tele placed on pt, pt states no complaints at this time

## 2013-06-29 NOTE — Progress Notes (Signed)
[facility] PROGRESS NOTE Subjective No gross bleeding the stools are still black. Didn't take miralax.  Objective: Vital signs in last 24 hours: Temp:  [98 F (36.7 C)-98.5 F (36.9 C)] 98.1 F (36.7 C) (03/12 0730) Pulse Rate:  [43-130] 94 (03/12 0730) Resp:  [15-25] 16 (03/12 0730) BP: (85-141)/(12-93) 121/61 mmHg (03/12 0730) SpO2:  [92 %-100 %] 92 % (03/12 0730) Last BM Date: 06/28/13  Intake/Output from previous day: 03/11 0701 - 03/12 0700 In: 1855 [P.O.:1180; I.V.:675] Out: 2650 [Urine:2650] Intake/Output this shift:    PE: Alert no change -  Lab Results:  Recent Labs  06/26/13 1939 06/27/13 0305 06/27/13 1228 06/28/13 0523  WBC 9.6 9.9 11.7* 9.7  HGB 6.3* 7.3* 8.9* 8.8*  HCT 19.2* 21.5* 26.2* 26.7*  PLT 283 250 249 276   BMET  Recent Labs  06/26/13 1939 06/27/13 0305 06/28/13 0523  NA 136* 139 141  K 3.9 4.1 3.6*  CL 99 102 105  CO2 21 20 21  CREATININE 2.78* 2.66* 2.30*   LFT  Recent Labs  06/26/13 1939 06/27/13 0305  PROT 6.5 6.0  AST 20 17  ALT 16 13  ALKPHOS 99 96  BILITOT 0.5 0.6   PT/INR  Recent Labs  06/27/13 0305 06/28/13 0523 06/29/13 0414  LABPROT 37.8* 26.9* 23.1*  INR 4.05* 2.59* 2.12*   PANCREAS No results found for this basename: LIPASE,  in the last 72 hours       Studies/Results: Dg Chest Port 1 View  06/27/2013   CLINICAL DATA Atrial fibrillation, history of TAVR that GI bleed.  EXAM PORTABLE CHEST - 1 VIEW  COMPARISON DG CHEST 2V dated 09/05/2012; DG CHEST 2V dated 09/24/2010; DG CHEST 2V dated 05/13/2010  FINDINGS Transcatheter aortic valve visualized. There is stable mild cardiomegaly. Lungs show chronic disease and interstitial prominence without overt airspace edema or pleural fluid. There may be a component of mild pulmonary venous hypertension/ early interstitial edema. No focal consolidation is identified.  IMPRESSION Stable chronic lung disease. There may be a mild component of pulmonary  venous hypertension/early interstitial edema.  SIGNATURE  Electronically Signed   By: Glenn  Yamagata M.D.   On: 06/27/2013 14:07    Medications: I have reviewed the patient's current medications.  Assessment/Plan: 1. GI Bleed. Hg stable INR drifting down. Agree reasonable to do colon now. Pt with history of prior polyps but has refused surveillance colonoscopy. Have scheduled for tomorrow at 8 :30. Discussed removing polyps controlling bleeding ? Coag AVM etc  INR should be ok for procedure by tomorrow.    Graysen Woodyard JR,Wendall Isabell L 06/29/2013, 8:01 AM   

## 2013-06-29 NOTE — Progress Notes (Signed)
Called report to [facility] on [place] pt to be transferred to [place] room 33. Wife aware. Pt eager to move and get rid of some of the wires. Packed up all personal belongings.

## 2013-06-29 NOTE — Progress Notes (Signed)
Pt transferred per w/c with all personal belongings. Wife & RN  accompanied pt. Placed in room 2W33. Telemetry monitor placed - pt oriented to room and met his nurse Minette Brine. Pt placed in recliner - says he's comfortable.

## 2013-06-29 NOTE — Progress Notes (Signed)
Assessment/Plan: Principal Problem:   Anemia, blood loss - Hb is stable. Bleeding seems to contniue. I talked with him about colonoscopy and we agreed that he should proceed. I will touch base with GI about this.  Active Problems:   CKD (chronic kidney disease) - low stage III chronically. Transiently worse right now   Aortic valve disorder   HTN (hypertension)   Atrial fibrillation   Coronary atherosclerosis of native coronary artery   Upper GI bleed - no apparent bleeding site. Now need to look at LGI tract.    Malnutrition of moderate degree   Subjective: Feels okay. Kind of antsy. We discussed his EGD findings. Still with black stools he states.   Objective:  Vital Signs: Filed Vitals:   06/28/13 2209 06/28/13 2300 06/29/13 0005 06/29/13 0417  BP: 138/38  117/93 119/44  Pulse: 113  82 84  Temp:   98 F (36.7 C) 98 F (36.7 C)  TempSrc:   Oral Oral  Resp:   19 21  Height:      Weight:      SpO2:  92% 99% 97%     EXAM: Alert, comfortable.    Intake/Output Summary (Last 24 hours) at 06/29/13 0702 Last data filed at 06/29/13 0600  Gross per 24 hour  Intake   1855 ml  Output   2650 ml  Net   -795 ml    Lab Results:  Recent Labs  06/26/13 1939 06/27/13 0305 06/28/13 0523  NA 136* 139 141  K 3.9 4.1 3.6*  CL 99 102 105  CO2 21 20 21   GLUCOSE 165* 113* 126*  BUN 91* 89* 63*  CREATININE 2.78* 2.66* 2.30*  CALCIUM 8.6 8.4 8.6  MG  --  2.3  --   PHOS  --  4.6  --     Recent Labs  06/26/13 1939 06/27/13 0305  AST 20 17  ALT 16 13  ALKPHOS 99 96  BILITOT 0.5 0.6  PROT 6.5 6.0  ALBUMIN 3.0* 2.9*   No results found for this basename: LIPASE, AMYLASE,  in the last 72 hours  Recent Labs  06/26/13 1939  06/27/13 1228 06/28/13 0523  WBC 9.6  < > 11.7* 9.7  NEUTROABS 7.1  --   --   --   HGB 6.3*  < > 8.9* 8.8*  HCT 19.2*  < > 26.2* 26.7*  MCV 99.5  < > 94.2 94.7  PLT 283  < > 249 276  < > = values in this interval not displayed.  Recent Labs  06/27/13 0305 06/27/13 0700 06/27/13 1228  TROPONINI <0.30 <0.30 <0.30   BNP No results found for this basename: probnp   No results found for this basename: DDIMER,  in the last 72 hours No results found for this basename: HGBA1C,  in the last 72 hours No results found for this basename: CHOL, HDL, LDLCALC, TRIG, CHOLHDL, LDLDIRECT,  in the last 72 hours  Recent Labs  06/27/13 0305  TSH 1.482   No results found for this basename: VITAMINB12, FOLATE, FERRITIN, TIBC, IRON, RETICCTPCT,  in the last 72 hours  Studies/Results: Dg Chest Port 1 View  06/27/2013   CLINICAL DATA Atrial fibrillation, history of TAVR that GI bleed.  EXAM PORTABLE CHEST - 1 VIEW  COMPARISON DG CHEST 2V dated 09/05/2012; DG CHEST 2V dated 09/24/2010; DG CHEST 2V dated 05/13/2010  FINDINGS Transcatheter aortic valve visualized. There is stable mild cardiomegaly. Lungs show chronic disease and interstitial prominence without overt airspace  edema or pleural fluid. There may be a component of mild pulmonary venous hypertension/ early interstitial edema. No focal consolidation is identified.  IMPRESSION Stable chronic lung disease. There may be a mild component of pulmonary venous hypertension/early interstitial edema.  SIGNATURE  Electronically Signed   By: Aletta Edouard M.D.   On: 06/27/2013 14:07   Medications: Medications administered in the last 24 hours reviewed.  Current Medication List reviewed.    LOS: 3 days   [facility] Internal Medicine @ [facility] [phone]) 06/29/2013, 7:02 AM

## 2013-06-30 ENCOUNTER — Encounter (HOSPITAL_COMMUNITY)
Admission: EM | Disposition: A | Discharge status: Home Health | Payer: Self-pay | Source: Home / Self Care | Attending: Internal Medicine

## 2013-06-30 ENCOUNTER — Encounter (HOSPITAL_COMMUNITY): Payer: Self-pay

## 2013-06-30 DIAGNOSIS — N179 Acute kidney failure, unspecified: Secondary | ICD-10-CM | POA: Diagnosis present

## 2013-06-30 DIAGNOSIS — N189 Chronic kidney disease, unspecified: Secondary | ICD-10-CM | POA: Diagnosis present

## 2013-06-30 HISTORY — PX: COLONOSCOPY: SHX5424

## 2013-06-30 LAB — BASIC METABOLIC PANEL
BUN: 47 mg/dL — ABNORMAL HIGH (ref 6–23)
CO2: 21 mEq/L (ref 19–32)
Calcium: 8.9 mg/dL (ref 8.4–10.5)
Chloride: 104 mEq/L (ref 96–112)
Creatinine, Ser: 2.16 mg/dL — ABNORMAL HIGH (ref 0.50–1.35)
GFR calc Af Amer: 31 mL/min — ABNORMAL LOW (ref >90)
GFR calc non Af Amer: 27 mL/min — ABNORMAL LOW (ref >90)
Glucose, Bld: 101 mg/dL — ABNORMAL HIGH (ref 70–99)
Potassium: 3.9 mEq/L (ref 3.7–5.3)
Sodium: 140 mEq/L (ref 137–147)

## 2013-06-30 LAB — PROTIME-INR
INR: 1.96 — AB (ref 0.00–1.49)
Prothrombin Time: 21.7 seconds — ABNORMAL HIGH (ref 11.6–15.2)

## 2013-06-30 LAB — CBC
HCT: 25 % — ABNORMAL LOW (ref 39.0–52.0)
Hemoglobin: 8.2 g/dL — ABNORMAL LOW (ref 13.0–17.0)
MCH: 30.8 pg (ref 26.0–34.0)
MCHC: 32.8 g/dL (ref 30.0–36.0)
MCV: 94 fL (ref 78.0–100.0)
Platelets: 244 10*3/uL (ref 150–400)
RBC: 2.66 MIL/uL — ABNORMAL LOW (ref 4.22–5.81)
RDW: 18.4 % — ABNORMAL HIGH (ref 11.5–15.5)
WBC: 8.2 10*3/uL (ref 4.0–10.5)

## 2013-06-30 SURGERY — COLONOSCOPY
Anesthesia: Moderate Sedation

## 2013-06-30 MED ORDER — MIDAZOLAM HCL 5 MG/ML IJ SOLN
INTRAMUSCULAR | Status: AC
  Filled 2013-06-30: qty 1

## 2013-06-30 MED ORDER — FENTANYL CITRATE 0.05 MG/ML IJ SOLN
INTRAMUSCULAR | Status: DC | PRN
Start: 2013-06-30 — End: 2013-06-30
  Administered 2013-06-30: 25 ug via INTRAVENOUS

## 2013-06-30 MED ORDER — MIDAZOLAM HCL 5 MG/5ML IJ SOLN
INTRAMUSCULAR | Status: DC | PRN
  Administered 2013-06-30: 1 mg via INTRAVENOUS
  Administered 2013-06-30: 2 mg via INTRAVENOUS

## 2013-06-30 MED ORDER — FENTANYL CITRATE 0.05 MG/ML IJ SOLN
INTRAMUSCULAR | Status: AC
  Filled 2013-06-30: qty 2

## 2013-06-30 MED ORDER — DIPHENHYDRAMINE HCL 50 MG/ML IJ SOLN
INTRAMUSCULAR | Status: AC
  Filled 2013-06-30: qty 1

## 2013-06-30 NOTE — Op Note (Signed)
[facility] [place], [address]   COLONOSCOPY PROCEDURE REPORT  PATIENT: [name], [name]  MR#: [id number] BIRTHDATE: 1930/07/25 , 82  yrs. old GENDER: Male ENDOSCOPIST: Laurence Spates, MD REFERRED BY: Dr Isidoro Donning PROCEDURE DATE:  06/30/2013 PROCEDURE:   Colonoscopy and  Polypectomy ASA CLASS:   class III INDICATIONS:  G.I. bleeding with prior history of colon polyps and negative EGD MEDICATIONS:  fentanyl 25 mcg, versed 3 mg IV  DESCRIPTION OF PROCEDURE: The procedure had been explained to the patient and consent obtained. A digital rectal exam was performed in the Pentax adult colonoscope was inserted. The prep was adequate. Using slight abdominal pressure we were able to advance to the cecum. Ileocecal valve and appendiceal orifice were identified and photographed. The scope was withdrawn and in the distal ascending colon a 1 cm sessile polyp was encountered and removed with a hot snare. It was too large to suck through the scope so we sucked against the tip of the scope and removed. The scope was reinserted back to the polypectomy site. There was no bleeding. The remainder of the colon was examined upon withdrawal. There were scattered diverticulitis in the leftcolon. There were one or 2 small ones 2 mm sessile polyp's seen in the Lt colon .these were left. Internal hemorrhoids were seen in the rectum and the retroflex view. There was no active bleeding seen throughout the entire colon. The scope was withdrawn the patient tolerated procedure well, there were no immediate complications.     COMPLICATIONS: None  ENDOSCOPIC IMPRESSION: 1. Ascending Colon Polyp. Removed 2. G.I. bleeding. No G.I. bleeding currently 3. Internal hemorrhoids 4. Scattered diverticulosis  RECOMMENDATIONS: 1. Follow Clinically. 2. Avoid antiplatelet agents for at least 5 days.    _______________________________ Lorrin MaisLaurence Spates, MD  06/30/2013 9:28 AM CC: Dr Isidoro Donning    PATIENT NAME:  [name], [name] MR#: [id number]

## 2013-06-30 NOTE — Interval H&P Note (Signed)
History and Physical Interval Note:  06/30/2013 8:32 AM  [name]  has presented today for surgery, with the diagnosis of gi bleed  The various methods of treatment have been discussed with the patient and family. After consideration of risks, benefits and other options for treatment, the patient has consented to  Procedure(s): COLONOSCOPY (N/A) as a surgical intervention .  The patient's history has been reviewed, patient examined, no change in status, stable for surgery.  I have reviewed the patient's chart and labs.  Questions were answered to the patient's satisfaction.     Mickey Esguerra JR,Keyleen Cerrato L

## 2013-06-30 NOTE — Clinical Documentation Improvement (Signed)
Presents with GIB, CKD3, Acute Blood Loss Anemia, Malnutrition of Moderate Degree.    Creatinine has dropped from 2.78 on admit to 2.16 this morning  Fluids given initially to hydrate  Besides the patient's Chronic Kidney Disease Stage 3, please clarify if the patient has a more acute condition and document your findings in next progress note and discharge summary.  Acute Renal Failure/Acute Kidney Injury Acute on Chronic Renal Failure Chronic Renal Failure Other Condition    Thank You, Zoila Shutter ,RN Clinical Documentation Specialist:  [place] Information Management

## 2013-06-30 NOTE — H&P (View-Only) (Signed)
[facility] PROGRESS NOTE Subjective No gross bleeding the stools are still black. Didn't take miralax.  Objective: Vital signs in last 24 hours: Temp:  [98 F (36.7 C)-98.5 F (36.9 C)] 98.1 F (36.7 C) (03/12 0730) Pulse Rate:  [43-130] 94 (03/12 0730) Resp:  [15-25] 16 (03/12 0730) BP: (85-141)/(12-93) 121/61 mmHg (03/12 0730) SpO2:  [92 %-100 %] 92 % (03/12 0730) Last BM Date: 06/28/13  Intake/Output from previous day: 03/11 0701 - 03/12 0700 In: 1855 [P.O.:1180; I.V.:675] Out: 2650 [Urine:2650] Intake/Output this shift:    PE: Alert no change -  Lab Results:  Recent Labs  06/26/13 1939 06/27/13 0305 06/27/13 1228 06/28/13 0523  WBC 9.6 9.9 11.7* 9.7  HGB 6.3* 7.3* 8.9* 8.8*  HCT 19.2* 21.5* 26.2* 26.7*  PLT 283 250 249 276   BMET  Recent Labs  06/26/13 1939 06/27/13 0305 06/28/13 0523  NA 136* 139 141  K 3.9 4.1 3.6*  CL 99 102 105  CO2 21 20 21   CREATININE 2.78* 2.66* 2.30*   LFT  Recent Labs  06/26/13 1939 06/27/13 0305  PROT 6.5 6.0  AST 20 17  ALT 16 13  ALKPHOS 99 96  BILITOT 0.5 0.6   PT/INR  Recent Labs  06/27/13 0305 06/28/13 0523 06/29/13 0414  LABPROT 37.8* 26.9* 23.1*  INR 4.05* 2.59* 2.12*   PANCREAS No results found for this basename: LIPASE,  in the last 72 hours       Studies/Results: Dg Chest Port 1 View  06/27/2013   CLINICAL DATA Atrial fibrillation, history of TAVR that GI bleed.  EXAM PORTABLE CHEST - 1 VIEW  COMPARISON DG CHEST 2V dated 09/05/2012; DG CHEST 2V dated 09/24/2010; DG CHEST 2V dated 05/13/2010  FINDINGS Transcatheter aortic valve visualized. There is stable mild cardiomegaly. Lungs show chronic disease and interstitial prominence without overt airspace edema or pleural fluid. There may be a component of mild pulmonary venous hypertension/ early interstitial edema. No focal consolidation is identified.  IMPRESSION Stable chronic lung disease. There may be a mild component of pulmonary  venous hypertension/early interstitial edema.  SIGNATURE  Electronically Signed   By: Aletta Edouard M.D.   On: 06/27/2013 14:07    Medications: I have reviewed the patient's current medications.  Assessment/Plan: 1. GI Bleed. Hg stable INR drifting down. Agree reasonable to do colon now. Pt with history of prior polyps but has refused surveillance colonoscopy. Have scheduled for tomorrow at 8 :30. Discussed removing polyps controlling bleeding ? Coag AVM etc  INR should be ok for procedure by tomorrow.    [name],[name] 06/29/2013, 8:01 AM

## 2013-06-30 NOTE — Progress Notes (Signed)
.    Per Dr. Oletta Lamas note, resume anticoagulants in 5 days.  Will sign off.  Call for questions.   Thayer Headings, Brooke Bonito., MD, [facility] 06/30/2013, 1:24 PM Office - [phone] Pager [phone]

## 2013-06-30 NOTE — Progress Notes (Signed)
Assessment/Plan: Principal Problem:   Anemia, blood loss - Hb pending today. Plan will be for colonoscopy today. With his med issues, I think we need to observe today and recheck CBC and BMET in AM prior to considering d/c assuming that his colonoscopy does not show any pathology.  Active Problems:   CKD (chronic kidney disease) - recheck today and in AM.    Aortic valve disorder   HTN (hypertension)   Atrial fibrillation - if no findings on colonoscopy will need to rethink anticoagulation. Will certainly not resume it during this admit.    Coronary atherosclerosis of native coronary artery   Malnutrition of moderate degree   Subjective: Has had a rough night with the prep. Pretty tired, but no chest pain, dyspnea, pain. Stools initially black as he prepped but now clear.   Objective:  Vital Signs: Filed Vitals:   06/29/13 1009 06/29/13 1328 06/29/13 1900 06/30/13 0522  BP: 126/41 108/46 117/64 99/41  Pulse: 102 91 111 83  Temp: 98.3 F (36.8 C) 98.3 F (36.8 C) 97.3 F (36.3 C) 98.6 F (37 C)  TempSrc: Oral Oral Oral Oral  Resp: 22 18 18 18   Height:      Weight:      SpO2: 98% 100% 100% 100%     EXAM: Alert, comfortable   Lab Results:  Recent Labs  06/28/13 0523  NA 141  K 3.6*  CL 105  CO2 21  GLUCOSE 126*  BUN 63*  CREATININE 2.30*  CALCIUM 8.6   No results found for this basename: AST, ALT, ALKPHOS, BILITOT, PROT, ALBUMIN,  in the last 72 hours No results found for this basename: LIPASE, AMYLASE,  in the last 72 hours  Recent Labs  06/27/13 1228 06/28/13 0523  WBC 11.7* 9.7  HGB 8.9* 8.8*  HCT 26.2* 26.7*  MCV 94.2 94.7  PLT 249 276    Recent Labs  06/27/13 0700 06/27/13 1228  TROPONINI <0.30 <0.30   BNP No results found for this basename: probnp   No results found for this basename: DDIMER,  in the last 72 hours No results found for this basename: HGBA1C,  in the last 72 hours No results found for this basename: CHOL, HDL, LDLCALC,  TRIG, CHOLHDL, LDLDIRECT,  in the last 72 hours No results found for this basename: TSH, T4TOTAL, FREET3, T3FREE, THYROIDAB,  in the last 72 hours No results found for this basename: VITAMINB12, FOLATE, FERRITIN, TIBC, IRON, RETICCTPCT,  in the last 72 hours  Studies/Results: No results found. Medications: Medications administered in the last 24 hours reviewed.  Current Medication List reviewed.    LOS: 4 days   [facility] Internal Medicine @ [facility] [phone]) 06/30/2013, 6:16 AM

## 2013-07-01 ENCOUNTER — Inpatient Hospital Stay (HOSPITAL_COMMUNITY): Payer: Medicare Other

## 2013-07-01 DIAGNOSIS — N189 Chronic kidney disease, unspecified: Secondary | ICD-10-CM

## 2013-07-01 DIAGNOSIS — N179 Acute kidney failure, unspecified: Secondary | ICD-10-CM

## 2013-07-01 DIAGNOSIS — R579 Shock, unspecified: Secondary | ICD-10-CM

## 2013-07-01 DIAGNOSIS — D500 Iron deficiency anemia secondary to blood loss (chronic): Secondary | ICD-10-CM

## 2013-07-01 DIAGNOSIS — K922 Gastrointestinal hemorrhage, unspecified: Secondary | ICD-10-CM | POA: Diagnosis not present

## 2013-07-01 LAB — CBC
HCT: 20.8 % — ABNORMAL LOW (ref 39.0–52.0)
HCT: 22.4 % — ABNORMAL LOW (ref 39.0–52.0)
HCT: 22.9 % — ABNORMAL LOW (ref 39.0–52.0)
Hemoglobin: 7.4 g/dL — ABNORMAL LOW (ref 13.0–17.0)
Hemoglobin: 8 g/dL — ABNORMAL LOW (ref 13.0–17.0)
Hemoglobin: 8 g/dL — ABNORMAL LOW (ref 13.0–17.0)
MCH: 31 pg (ref 26.0–34.0)
MCH: 31.1 pg (ref 26.0–34.0)
MCH: 30.3 pg (ref 26.0–34.0)
MCHC: 35.6 g/dL (ref 30.0–36.0)
MCHC: 35.7 g/dL (ref 30.0–36.0)
MCHC: 34.9 g/dL (ref 30.0–36.0)
MCV: 87 fL (ref 78.0–100.0)
MCV: 87.2 fL (ref 78.0–100.0)
MCV: 86.7 fL (ref 78.0–100.0)
PLATELETS: 137 10*3/uL — AB (ref 150–400)
PLATELETS: 140 10*3/uL — AB (ref 150–400)
Platelets: 136 10*3/uL — ABNORMAL LOW (ref 150–400)
RBC: 2.39 MIL/uL — AB (ref 4.22–5.81)
RBC: 2.57 MIL/uL — ABNORMAL LOW (ref 4.22–5.81)
RBC: 2.64 MIL/uL — AB (ref 4.22–5.81)
RDW: 14.9 % (ref 11.5–15.5)
RDW: 15.3 % (ref 11.5–15.5)
RDW: 15.4 % (ref 11.5–15.5)
WBC: 10.7 10*3/uL — ABNORMAL HIGH (ref 4.0–10.5)
WBC: 12.6 10*3/uL — ABNORMAL HIGH (ref 4.0–10.5)
WBC: 12.6 10*3/uL — ABNORMAL HIGH (ref 4.0–10.5)

## 2013-07-01 LAB — PREPARE RBC (CROSSMATCH)

## 2013-07-01 LAB — BASIC METABOLIC PANEL
BUN: 61 mg/dL — ABNORMAL HIGH (ref 6–23)
BUN: 63 mg/dL — AB (ref 6–23)
CO2: 15 mEq/L — ABNORMAL LOW (ref 19–32)
CO2: 18 mEq/L — ABNORMAL LOW (ref 19–32)
CREATININE: 2.69 mg/dL — AB (ref 0.50–1.35)
Calcium: 7.8 mg/dL — ABNORMAL LOW (ref 8.4–10.5)
Calcium: 7.1 mg/dL — ABNORMAL LOW (ref 8.4–10.5)
Chloride: 104 mEq/L (ref 96–112)
Chloride: 109 mEq/L (ref 96–112)
Creatinine, Ser: 2.55 mg/dL — ABNORMAL HIGH (ref 0.50–1.35)
GFR calc Af Amer: 26 mL/min — ABNORMAL LOW (ref >90)
GFR calc non Af Amer: 22 mL/min — ABNORMAL LOW (ref >90)
GFR, EST AFRICAN AMERICAN: 24 mL/min — AB (ref >90)
GFR, EST NON AFRICAN AMERICAN: 21 mL/min — AB (ref >90)
Glucose, Bld: 165 mg/dL — ABNORMAL HIGH (ref 70–99)
Glucose, Bld: 143 mg/dL — ABNORMAL HIGH (ref 70–99)
Potassium: 4 mEq/L (ref 3.7–5.3)
Potassium: 3.9 mEq/L (ref 3.7–5.3)
Sodium: 138 mEq/L (ref 137–147)
Sodium: 139 mEq/L (ref 137–147)

## 2013-07-01 LAB — TROPONIN I
Troponin I: <0.3 ng/mL (ref <0.30)
Troponin I: <0.3 ng/mL (ref <0.30)

## 2013-07-01 LAB — LACTIC ACID, PLASMA: LACTIC ACID, VENOUS: 1.1 mmol/L (ref 0.5–2.2)

## 2013-07-01 LAB — PROTIME-INR
INR: 2.21 — ABNORMAL HIGH (ref 0.00–1.49)
Prothrombin Time: 23.8 seconds — ABNORMAL HIGH (ref 11.6–15.2)

## 2013-07-01 LAB — MAGNESIUM: Magnesium: 2 mg/dL (ref 1.5–2.5)

## 2013-07-01 LAB — GLUCOSE, CAPILLARY: GLUCOSE-CAPILLARY: 119 mg/dL — AB (ref 70–99)

## 2013-07-01 LAB — HEMOGLOBIN AND HEMATOCRIT, BLOOD
HEMATOCRIT: 19.1 % — AB (ref 39.0–52.0)
Hemoglobin: 6.4 g/dL — CL (ref 13.0–17.0)

## 2013-07-01 LAB — PHOSPHORUS: Phosphorus: 3.9 mg/dL (ref 2.3–4.6)

## 2013-07-01 MED ORDER — PHENYLEPHRINE HCL 10 MG/ML IJ SOLN
30.0000 ug/min | INTRAVENOUS | Status: DC
  Administered 2013-07-01: 120 ug/min via INTRAVENOUS
  Administered 2013-07-01: 166.667 ug/min via INTRAVENOUS
  Administered 2013-07-01: 90 ug/min via INTRAVENOUS
  Filled 2013-07-01 (×5): qty 2

## 2013-07-01 MED ORDER — BIOTENE DRY MOUTH MT LIQD
15.0000 mL | Freq: Two times a day (BID) | OROMUCOSAL | Status: DC
  Administered 2013-07-01 – 2013-07-07 (×12): 15 mL via OROMUCOSAL

## 2013-07-01 MED ORDER — POLYETHYLENE GLYCOL 3350 17 G PO PACK
17.0000 g | PACK | Freq: Two times a day (BID) | ORAL | Status: DC
  Administered 2013-07-05 – 2013-07-08 (×2): 17 g via ORAL
  Filled 2013-07-01 (×16): qty 1

## 2013-07-01 MED ORDER — CHLORHEXIDINE GLUCONATE 0.12 % MT SOLN
15.0000 mL | Freq: Two times a day (BID) | OROMUCOSAL | Status: DC
  Administered 2013-07-02 – 2013-07-08 (×12): 15 mL via OROMUCOSAL
  Filled 2013-07-01 (×13): qty 15

## 2013-07-01 MED ORDER — SODIUM CHLORIDE 0.9 % IV BOLUS (SEPSIS)
500.0000 mL | Freq: Once | INTRAVENOUS | Status: AC
  Administered 2013-07-01: 500 mL via INTRAVENOUS

## 2013-07-01 MED ORDER — VITAMIN K1 10 MG/ML IJ SOLN
5.0000 mg | Freq: Once | INTRAVENOUS | Status: AC
  Administered 2013-07-01: 5 mg via INTRAVENOUS
  Filled 2013-07-01: qty 0.5

## 2013-07-01 MED ORDER — PANTOPRAZOLE SODIUM 40 MG IV SOLR
40.0000 mg | Freq: Every day | INTRAVENOUS | Status: DC
  Administered 2013-07-01 – 2013-07-05 (×5): 40 mg via INTRAVENOUS
  Filled 2013-07-01 (×6): qty 40

## 2013-07-01 MED ORDER — TECHNETIUM TC 99M-LABELED RED BLOOD CELLS IV KIT
25.0000 | PACK | Freq: Once | INTRAVENOUS | Status: AC | PRN
  Administered 2013-07-01: 25 via INTRAVENOUS

## 2013-07-01 MED ORDER — PHENYLEPHRINE HCL 10 MG/ML IJ SOLN
30.0000 ug/min | INTRAVENOUS | Status: DC
  Administered 2013-07-01: 30 ug/min via INTRAVENOUS
  Filled 2013-07-01 (×2): qty 1

## 2013-07-01 NOTE — Progress Notes (Signed)
[facility] Progress Note Patient Name: [name] DOB: Aug 30, 1930 MRN: [id number]  Date of Service  07/01/2013   HPI/Events of Note   RN unable to pass Foley.  eICU Interventions   Re consult Urology for Placement.    Intervention Category Intermediate Interventions: Oliguria - evaluation and management  Zhi Geier R. 07/01/2013, 7:44 PM

## 2013-07-01 NOTE — Progress Notes (Signed)
eLink Physician-Brief Progress Note Patient Name: MAKEL MCMANN DOB: February 07, 1931 MRN: [id number]  Date of Service  07/01/2013   HPI/Events of Note   Oliguric. Bladder scan + for 400cc. Neobladder. Wife claims that they have had difficulty getting Foley catheters in the past. Confirmed via chart review.  eICU Interventions   Consult urology for Foley placement.   Intervention Category Intermediate Interventions: Oliguria - evaluation and management  Faizan Geraci R. 07/01/2013, 5:32 PM

## 2013-07-01 NOTE — Progress Notes (Signed)
Patient notified this RN of the patient's recent stool with the patient states contain virtually all frank blood. The patient flushed the stool before it could be viewed, however there was red blood splatter around and outside the commode The on-call physician has ordered stat hemoglobin will continue to monitor.

## 2013-07-01 NOTE — Procedures (Signed)
Central Venous Catheter Insertion Procedure Note LUCAS WINOGRAD [id number] 15-Dec-1930  Procedure: Insertion of Central Venous Catheter Indications: Assessment of intravascular volume, Drug and/or fluid administration and Frequent blood sampling  Procedure Details Consent: Risks of procedure as well as the alternatives and risks of each were explained to the (patient/caregiver).  Consent for procedure obtained. Time Out: Verified patient identification, verified procedure, site/side was marked, verified correct patient position, special equipment/implants available, medications/allergies/relevent history reviewed, required imaging and test results available.  Performed  Maximum sterile technique was used including antiseptics, cap, gloves, gown, hand hygiene, mask and sheet. Skin prep: Chlorhexidine; local anesthetic administered A antimicrobial bonded/coated triple lumen catheter was placed in the left internal jugular vein using the Seldinger technique. Ultrasound guidance used.yes Catheter placed to 20 cm. Blood aspirated via all 3 ports and then flushed x 3. Line sutured x 2 and dressing applied.  Evaluation Blood flow good Complications: No apparent complications Patient did tolerate procedure well. Chest X-ray ordered to verify placement.  CXR: pending.  Richardson Landry Savannah Morford ACNP Maryanna Shape PCCM Pager [phone] till 3 pm If no answer page [phone] 07/01/2013, 12:06 PM

## 2013-07-01 NOTE — Consult Note (Signed)
PULMONARY  / CRITICAL CARE MEDICINE  Name: [name] MRN: [id number] DOB: Dec 27, 1930 PCP Horton Finer, MD    ADMISSION DATE:  06/26/2013  LOS 5 days   CONSULTATION DATE:  07/01/13  REFERRING MD :  Dr Delfina Redwood  PRIMARY SERVICE: Sadie Haber -> PCCM  CHIEF COMPLAINT:  LGI bleed with hemodynamic instability  BRIEF PATIENT DESCRIPTION: see HPI  SIGNIFICANT EVENTS / STUDIES:  06/26/2013 - admit 3./11/15 - ugi scope - normal 06/30/13 - Colonsocpsy - ascending colon polypectomy. Scattered diverticulosis. Hemorrhoids. No active bleed 07/01/13 - Move tO ICU    LINES / TUBES: CVL 07/01/13 - planned  CULTURES: none  ANTIBIOTICS: Anti-infectives   None    \   HISTORY OF PRESENT ILLNESS:  80 year old male with hx of polyps but refused screening scopies. Also A Fib and bioprosthetic aortic coumadin.s/p neo bladder early 200s Admitted 06/26/2013 wih 10 day shx of melena in setting of high INR 4. Underwent upper and lower endoscopies as above without sign of bleed. Also s/p polypectomnt Then on 07/01/13: had futher bleed but BRPR this time with bp/hr instability. Getting 3 units PRBC an 1 unit FFP and moed to ICU. PCCM now asuming primary service. GI wants tagged nuclear scan first.  Has one large bloody bowel movement after arrival to ICU   PAST MEDICAL HISTORY :  Past Medical History  Diagnosis Date  . Aortic valve disorder     s/p TAVR at [facility]   . PVD (peripheral vascular disease)   . Hypercholesteremia   . HTN (hypertension)   . Atrial fibrillation     Chronic  . Atrial flutter   . CKD (chronic kidney disease)   . AAA (abdominal aortic aneurysm)   . 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 CAD (coronary artery disease)     Status post CABG, stent to diagonal prior to TAVR  . Bladder cancer   . Gallstone   . Vitamin B12 deficiency   . Colon cancer   . Carotid stenosis   . Gout   . Gout      Family History   Problem Relation Age of Onset  . Cancer Mother     Colon cancer  . Heart disease Father   . Diabetes Father   . Cancer Brother     esophageal     History   Social History  . Marital Status: Married    Spouse Name: N/A    Number of Children: 3  . Years of Education: college   Occupational History  . RETIRED    Social History Main Topics  . Smoking status: Former Research scientist (life sciences)  . Smokeless tobacco: Never Used  . Alcohol Use: Yes     Comment: occasional  . Drug Use: No  . Sexual Activity: Not on file   Other Topics Concern  . Not on file   Social History Narrative  . No narrative on file     Allergies  Allergen Reactions  . Ramipril Other (See Comments)    unknown      (Not in an outpatient encounter)     REVIEW OF SYSTEMS:  Dehydrated and thirsty. REst per HPI  SUBJECTIVE:   VITAL SIGNS: Filed Vitals:   07/01/13 0932 07/01/13 0944 07/01/13 1004 07/01/13 1017  BP: 74/32 72/34 72/32  78/40  Pulse:    111  Temp:  97.5 F (36.4 C)    TempSrc:  Axillary    Resp: 22 20 20  Height:      Weight:      SpO2:          HEMODYNAMICS:   VENTILATOR SETTINGS:  INTAKE / OUTPUT: I/O last 3 completed shifts: In: 480 [P.O.:480] Out: -      PHYSICAL EXAMINATION: General:  Frail, deconditioned, plesant Neuro:  AxOx3. Moves all 4s HEENT:  Neck supple Cardiovascular:  S1S2 +. No murmurs Lungs:  CTA bilaterally. No distress Abdomen:  surgial scar +. Soft, non tender, hyper active bowel sounds Musculoskeletal:  No cyanosis, no clubbing, no eema Skin:  intact  LABS: PULMONARY No results found for this basename: PHART, PCO2, PCO2ART, PO2, PO2ART, HCO3, TCO2, O2SAT,  in the last 168 hours  CBC  Recent Labs Lab 06/27/13 1228 06/28/13 0523 06/30/13 0545 07/01/13 0215  HGB 8.9* 8.8* 8.2* 6.4*  HCT 26.2* 26.7* 25.0* 19.1*  WBC 11.7* 9.7 8.2  --   PLT 249 276 244  --     COAGULATION  Recent Labs Lab 06/27/13 0305 06/28/13 0523 06/29/13 0414  06/30/13 0545 07/01/13 0523  INR 4.05* 2.59* 2.12* 1.96* 2.21*    CARDIAC   Recent Labs Lab 06/27/13 0305 06/27/13 0700 06/27/13 1228  TROPONINI <0.30 <0.30 <0.30   No results found for this basename: PROBNP,  in the last 168 hours   CHEMISTRY  Recent Labs Lab 06/26/13 1939 06/27/13 0305 06/28/13 0523 06/30/13 0545 07/01/13 0523  NA 136* 139 141 140 138  K 3.9 4.1 3.6* 3.9 4.0  CL 99 102 105 104 104  CO2 21 20 21 21  15*  GLUCOSE 165* 113* 126* 101* 165*  BUN 91* 89* 63* 47* 61*  CREATININE 2.78* 2.66* 2.30* 2.16* 2.55*  CALCIUM 8.6 8.4 8.6 8.9 7.8*  MG  --  2.3  --   --   --   PHOS  --  4.6  --   --   --    Estimated Creatinine Clearance: 22.4 ml/min (by C-G formula based on Cr of 2.55).   LIVER  Recent Labs Lab 06/26/13 1939 06/27/13 0305 06/28/13 0523 06/29/13 0414 06/30/13 0545 07/01/13 0523  AST 20 17  --   --   --   --   ALT 16 13  --   --   --   --   ALKPHOS 99 96  --   --   --   --   BILITOT 0.5 0.6  --   --   --   --   PROT 6.5 6.0  --   --   --   --   ALBUMIN 3.0* 2.9*  --   --   --   --   INR 4.17* 4.05* 2.59* 2.12* 1.96* 2.21*     INFECTIOUS No results found for this basename: LATICACIDVEN, PROCALCITON,  in the last 168 hours   ENDOCRINE CBG (last 3)   Recent Labs  07/01/13 1031  GLUCAP 119*         IMAGING x48h  No results found.     ASSESSMENT / PLAN:  PULMONARY A: No curernt resp distress P:   Monitor  CARDIOVASCULAR A:  Baseline A Fib and bioprosthetic aortic valve Circulatory shock due to hemrrhage P:  Blood product resus  FFP, PRBC Start neo  RENAL A:  Acute on chronic renal insuff P:   kee up with bp/hr  GASTROINTESTINAL A:  Repeat GI blled ? source P:   NM scan after bp/hr stabilization [facility] Dr Paulita Fujita following ? Needs repeat colonoscopy  HEMATOLOGIC A:  Anemia of bleeding. High INR from residual coumadin P:  PRBC for hgb < 7gm% of bp/hr instability FFP and Vit K Track  INR  INFECTIOUS A:  No evidence of infection P:   monitor  ENDOCRINE A:  Nil acute P:   monitor  NEUROLOGIC A:  intact P:   monitor  TODAY'S SUMMARY: VOlume resus. Wife updated. Placed cvl. Patient updated. Full code     The patient is critically ill with multiple organ systems failure and requires high complexity decision making for assessment and support, frequent evaluation and titration of therapies, application of advanced monitoring technologies and extensive interpretation of multiple databases.   Critical Care Time devoted to patient care services described in this note is  45  Minutes.  Dr. Brand Males, M.D., [facility].C.P Pulmonary and Critical Care Medicine Staff Physician [place] [facility] Pager: [phone], If no answer or between  15:00h - 7:00h: call [phone]  07/01/2013 11:30 AM

## 2013-07-01 NOTE — Significant Event (Signed)
Rapid Response Event Note  Overview: Time Called: 0812 Arrival Time: 0813 Event Type: Cardiac  Initial Focused Assessment:  Called by Rn, patient noted to by hypotensive SBP 70's.  Upon my arrival to bedside, RN at bedside.  Patient lying supine in bed, pale, lethargic.  Nasal cannula in place.  Patient answers appropriately with intermittent confusion.  PRBC's had been infusing, lost PIV's.  RN unable to obtain PIV.  IV team called and placed 2 PIV.  PRBC's restarted.   Interventions:  250cc NS bolus given, 2nd unit of PRBC's started.  Last BP 72/34, HR 110's.  Patient now having active GI bleeding.  Dr. Delfina Redwood paged and notified.  Dr. Paulita Fujita paged and notified.  Orders received, will transfer patient to ICU.  Wife at bedside and updated   Event Summary:   at      at          Kennedy Bucker

## 2013-07-01 NOTE — Progress Notes (Signed)
eLink Physician-Brief Progress Note Patient Name: [name] DOB: [birth year] MRN: [id number]  Date of Service  07/01/2013   HPI/Events of Note   Spoke with Dr. Risa Grill of Urology.  eICU Interventions   RN to try to place Foley, Urology consult if unsuccessful.    Intervention Category Intermediate Interventions: Oliguria - evaluation and management  Rodney Yera R. 07/01/2013, 5:52 PM

## 2013-07-01 NOTE — Consult Note (Signed)
Urology Consult  Referring physician: Margarette Asal, MD Reason for referral: Inability to Pl., Foley catheter, question urinary retention, acute renal failure  History of Present Illness: [name] is very well known to me. He is status post cystectomy with neobladder in the distant past. He has had some additional procedures over the years as well. He is currently being seen on annual basis in our office for routine followup and is continued to do well urologically. He is currently in the intensive care unit secondary to a GI bleed with the etiology has not been established. He has been voiding fine according to the patient. He has not voided all day today really does not have any significant pain or pressure. Apparently a bladder scan was performed which suggested a distended bladder and one attempt at catheter placement was unsuccessful by the nursing staff. We were contacted for assistance in assessment of possible urinary retention and help with placement of a Foley catheter  Past Medical History  Diagnosis Date  . Aortic valve disorder     s/[facility]   . PVD (peripheral vascular disease)   . Hypercholesteremia   . HTN (hypertension)   . Atrial fibrillation     Chronic  . Atrial flutter   . CKD (chronic kidney disease)   . AAA (abdominal aortic aneurysm)   . 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 CAD (coronary artery disease)     Status post CABG, stent to diagonal prior to TAVR  . Bladder cancer   . Gallstone   . Vitamin B12 deficiency   . Colon cancer   . Carotid stenosis   . Gout   . Gout    Past Surgical History  Procedure Laterality Date  . Coronary artery bypass graft    . Laminectomy    . Appendectomy    . Colectomy    . Toe surgery    . Hemicolectomy    . Mohs surgery    . Angioplasty    . Esophagogastroduodenoscopy N/A 06/28/2013    Procedure: ESOPHAGOGASTRODUODENOSCOPY (EGD);  Surgeon: Winfield Cunas.,  MD;  Location: [facility] ENDOSCOPY;  Service: Endoscopy;  Laterality: N/A;    Medications:  Scheduled: . antiseptic oral rinse  15 mL Mouth Rinse q12n4p  . chlorhexidine  15 mL Mouth Rinse BID  . febuxostat  40 mg Oral q morning - 10a  . pantoprazole (PROTONIX) IV  40 mg Intravenous Q1200  . polyethylene glycol  17 g Oral BID    Allergies:  Allergies  Allergen Reactions  . Ramipril Other (See Comments)    unknown    Family History  Problem Relation Age of Onset  . Cancer Mother     Colon cancer  . Heart disease Father   . Diabetes Father   . Cancer Brother     esophageal    Social History:  reports that he has quit smoking. He has never used smokeless tobacco. He reports that he drinks alcohol. He reports that he does not use illicit drugs.  ROS negative for any recent voiding difficulties. No current complaints of abdominal or flank pain. He has been having some fatigue secondary to the anemia. Currently having no discomfort shortness of breath or chest pain. Otherwise negative  Physical Exam:  Vital signs in last 24 hours: Temp:  [97.2 F (36.2 C)-98.7 F (37.1 C)] 97.8 F (36.6 C) (03/14 1758) Pulse Rate:  [38-113] 84 (03/14 1930) Resp:  [11-26] 15 (  03/14 1930) BP: (71-152)/(25-93) 152/48 mmHg (03/14 1930) SpO2:  [79 %-100 %] 100 % (03/14 1930)  Constitutional: Vital signs reviewed. WD WN in NAD Head: Normocephalic and atraumatic   Eyes: PERRL, No scleral icterus.  Neck: Supple No  Gross JVD, mass, thyromegaly, or carotid bruit present.  Cardiovascular: RRR Pulmonary/Chest: Normal effort Abdominal: Soft. Non-tender, non-distended, no gross palpable bladder distention Genitourinary: Normal external genitalia Extremities: No cyanosis or edema  Neurological: Grossly non-focal.  Skin: Warm,very dry and intact. No rash, cyanosis   Laboratory Data:  Results for orders placed during the hospital encounter of 06/26/13 (from the past 72 hour(s))  PROTIME-INR     Status:  Abnormal   Collection Time    06/29/13  4:14 AM      Result Value Ref Range   Prothrombin Time 23.1 (*) 11.6 - 15.2 seconds   INR 2.12 (*) 0.00 - 1.49  PROTIME-INR     Status: Abnormal   Collection Time    06/30/13  5:45 AM      Result Value Ref Range   Prothrombin Time 21.7 (*) 11.6 - 15.2 seconds   INR 1.96 (*) 0.00 - 1.49  CBC     Status: Abnormal   Collection Time    06/30/13  5:45 AM      Result Value Ref Range   WBC 8.2  4.0 - 10.5 K/uL   RBC 2.66 (*) 4.22 - 5.81 MIL/uL   Hemoglobin 8.2 (*) 13.0 - 17.0 g/dL   HCT 25.0 (*) 39.0 - 52.0 %   MCV 94.0  78.0 - 100.0 fL   MCH 30.8  26.0 - 34.0 pg   MCHC 32.8  30.0 - 36.0 g/dL   RDW 18.4 (*) 11.5 - 15.5 %   Platelets 244  150 - 400 K/uL  BASIC METABOLIC PANEL     Status: Abnormal   Collection Time    06/30/13  5:45 AM      Result Value Ref Range   Sodium 140  137 - 147 mEq/L   Potassium 3.9  3.7 - 5.3 mEq/L   Chloride 104  96 - 112 mEq/L   CO2 21  19 - 32 mEq/L   Glucose, Bld 101 (*) 70 - 99 mg/dL   BUN 47 (*) 6 - 23 mg/dL   Creatinine, Ser 2.16 (*) 0.50 - 1.35 mg/dL   Calcium 8.9  8.4 - 10.5 mg/dL   GFR calc non Af Amer 27 (*) >90 mL/min   GFR calc Af Amer 31 (*) >90 mL/min   Comment: (NOTE)     The eGFR has been calculated using the CKD EPI equation.     This calculation has not been validated in all clinical situations.     eGFR's persistently <90 mL/min signify possible Chronic Kidney     Disease.  HEMOGLOBIN AND HEMATOCRIT, BLOOD     Status: Abnormal   Collection Time    07/01/13  2:15 AM      Result Value Ref Range   Hemoglobin 6.4 (*) 13.0 - 17.0 g/dL   Comment: DELTA CHECK NOTED     REPEATED TO VERIFY     CRITICAL RESULT CALLED TO, READ BACK BY AND VERIFIED WITH:     Madelon Lips (RN) [phone] 07/01/2013 L. LOMAX   HCT 19.1 (*) 39.0 - 52.0 %  PREPARE RBC (CROSSMATCH)     Status: None   Collection Time    07/01/13  3:24 AM      Result Value Ref Range  Order Confirmation ORDER PROCESSED BY BLOOD BANK    TYPE  AND SCREEN     Status: None   Collection Time    07/01/13  4:07 AM      Result Value Ref Range   ABO/RH(D) O POS     Antibody Screen NEG     Sample Expiration 07/04/2013     Unit Number [id number]     Blood Component Type RED CELLS,LR     Unit division 00     Status of Unit ISSUED     Transfusion Status OK TO TRANSFUSE     Crossmatch Result Compatible     Unit Number [id number]     Blood Component Type RBC LR PHER1     Unit division 00     Status of Unit ISSUED     Transfusion Status OK TO TRANSFUSE     Crossmatch Result Compatible     Unit Number [id number]     Blood Component Type RED CELLS,LR     Unit division 00     Status of Unit ISSUED     Transfusion Status OK TO TRANSFUSE     Crossmatch Result Compatible     Unit Number [id number]     Blood Component Type RED CELLS,LR     Unit division 00     Status of Unit ISSUED     Transfusion Status OK TO TRANSFUSE     Crossmatch Result Compatible     Unit Number [id number]     Blood Component Type RBC LR PHER1     Unit division 00     Status of Unit ISSUED     Transfusion Status OK TO TRANSFUSE     Crossmatch Result Compatible    PROTIME-INR     Status: Abnormal   Collection Time    07/01/13  5:23 AM      Result Value Ref Range   Prothrombin Time 23.8 (*) 11.6 - 15.2 seconds   INR 2.21 (*) 0.00 - 9.38  BASIC METABOLIC PANEL     Status: Abnormal   Collection Time    07/01/13  5:23 AM      Result Value Ref Range   Sodium 138  137 - 147 mEq/L   Potassium 4.0  3.7 - 5.3 mEq/L   Chloride 104  96 - 112 mEq/L   CO2 15 (*) 19 - 32 mEq/L   Glucose, Bld 165 (*) 70 - 99 mg/dL   BUN 61 (*) 6 - 23 mg/dL   Creatinine, Ser 2.55 (*) 0.50 - 1.35 mg/dL   Calcium 7.8 (*) 8.4 - 10.5 mg/dL   GFR calc non Af Amer 22 (*) >90 mL/min   GFR calc Af Amer 26 (*) >90 mL/min   Comment: (NOTE)     The eGFR has been calculated using the CKD EPI equation.     This calculation has not been validated in all clinical situations.      eGFR's persistently <90 mL/min signify possible Chronic Kidney     Disease.  GLUCOSE, CAPILLARY     Status: Abnormal   Collection Time    07/01/13 10:31 AM      Result Value Ref Range   Glucose-Capillary 119 (*) 70 - 99 mg/dL  PREPARE RBC (CROSSMATCH)     Status: None   Collection Time    07/01/13 10:46 AM      Result Value Ref Range   Order Confirmation ORDER PROCESSED BY BLOOD BANK    PREPARE FRESH FROZEN  PLASMA     Status: None   Collection Time    07/01/13 10:50 AM      Result Value Ref Range   Unit Number [id number]     Blood Component Type THAWED PLASMA     Unit division 00     Status of Unit ISSUED     Transfusion Status OK TO TRANSFUSE     Unit Number [id number]     Blood Component Type THAWED PLASMA     Unit division 00     Status of Unit ISSUED     Transfusion Status OK TO TRANSFUSE    CBC     Status: Abnormal   Collection Time    07/01/13  3:00 PM      Result Value Ref Range   WBC 10.7 (*) 4.0 - 10.5 K/uL   RBC 2.39 (*) 4.22 - 5.81 MIL/uL   Hemoglobin 7.4 (*) 13.0 - 17.0 g/dL   HCT 20.8 (*) 39.0 - 52.0 %   MCV 87.0  78.0 - 100.0 fL   Comment: POST TRANSFUSION SPECIMEN   MCH 31.0  26.0 - 34.0 pg   MCHC 35.6  30.0 - 36.0 g/dL   RDW 14.9  11.5 - 15.5 %   Platelets 137 (*) 150 - 400 K/uL   Comment: DELTA CHECK NOTED     REPEATED TO VERIFY  LACTIC ACID, PLASMA     Status: None   Collection Time    07/01/13  3:00 PM      Result Value Ref Range   Lactic Acid, Venous 1.1  0.5 - 2.2 mmol/L  TROPONIN I     Status: None   Collection Time    07/01/13  3:00 PM      Result Value Ref Range   Troponin I <0.30  <0.30 ng/mL   Comment:            Due to the release kinetics of cTnI,     a negative result within the first hours     of the onset of symptoms does not rule out     myocardial infarction with certainty.     If myocardial infarction is still suspected,     repeat the test at appropriate intervals.  BASIC METABOLIC PANEL     Status: Abnormal    Collection Time    07/01/13  4:47 PM      Result Value Ref Range   Sodium 139  137 - 147 mEq/L   Potassium 3.9  3.7 - 5.3 mEq/L   Chloride 109  96 - 112 mEq/L   CO2 18 (*) 19 - 32 mEq/L   Glucose, Bld 143 (*) 70 - 99 mg/dL   BUN 63 (*) 6 - 23 mg/dL   Creatinine, Ser 2.69 (*) 0.50 - 1.35 mg/dL   Calcium 7.1 (*) 8.4 - 10.5 mg/dL   GFR calc non Af Amer 21 (*) >90 mL/min   GFR calc Af Amer 24 (*) >90 mL/min   Comment: (NOTE)     The eGFR has been calculated using the CKD EPI equation.     This calculation has not been validated in all clinical situations.     eGFR's persistently <90 mL/min signify possible Chronic Kidney     Disease.  MAGNESIUM     Status: None   Collection Time    07/01/13  4:47 PM      Result Value Ref Range   Magnesium 2.0  1.5 - 2.5 mg/dL  PHOSPHORUS  Status: None   Collection Time    07/01/13  4:47 PM      Result Value Ref Range   Phosphorus 3.9  2.3 - 4.6 mg/dL  CBC     Status: Abnormal   Collection Time    07/01/13  7:00 PM      Result Value Ref Range   WBC 12.6 (*) 4.0 - 10.5 K/uL   RBC 2.57 (*) 4.22 - 5.81 MIL/uL   Hemoglobin 8.0 (*) 13.0 - 17.0 g/dL   HCT 22.4 (*) 39.0 - 52.0 %   MCV 87.2  78.0 - 100.0 fL   MCH 31.1  26.0 - 34.0 pg   MCHC 35.7  30.0 - 36.0 g/dL   RDW 15.3  11.5 - 15.5 %   Platelets 136 (*) 150 - 400 K/uL  CBC     Status: Abnormal   Collection Time    07/01/13  7:50 PM      Result Value Ref Range   WBC 12.6 (*) 4.0 - 10.5 K/uL   RBC 2.64 (*) 4.22 - 5.81 MIL/uL   Hemoglobin 8.0 (*) 13.0 - 17.0 g/dL   HCT 22.9 (*) 39.0 - 52.0 %   MCV 86.7  78.0 - 100.0 fL   MCH 30.3  26.0 - 34.0 pg   MCHC 34.9  30.0 - 36.0 g/dL   RDW 15.4  11.5 - 15.5 %   Platelets 140 (*) 150 - 400 K/uL  TROPONIN I     Status: None   Collection Time    07/01/13  7:57 PM      Result Value Ref Range   Troponin I <0.30  <0.30 ng/mL   Comment:            Due to the release kinetics of cTnI,     a negative result within the first hours     of the onset  of symptoms does not rule out     myocardial infarction with certainty.     If myocardial infarction is still suspected,     repeat the test at appropriate intervals.   Recent Results (from the past 240 hour(s))  MRSA PCR SCREENING     Status: None   Collection Time    06/27/13  1:40 AM      Result Value Ref Range Status   MRSA by PCR NEGATIVE  NEGATIVE Final   Comment:            The GeneXpert MRSA Assay (FDA     approved for NASAL specimens     only), is one component of a     comprehensive MRSA colonization     surveillance program. It is not     intended to diagnose MRSA     infection nor to guide or     monitor treatment for     MRSA infections.   Creatinine:  Recent Labs  06/26/13 1939 06/27/13 0305 06/28/13 0523 06/30/13 0545 07/01/13 0523 07/01/13 1647  CREATININE 2.78* 2.66* 2.30* 2.16* 2.55* 2.69*   Baseline Creatinine: 2.1  Impression/Assessment:  16 French Foley catheter inserted in the neobladder without any difficulty. Approximately 75-100 cc of clear urine was obtained. No evidence of urinary retention. New bladder scan produce mucus and therefore irrigation the catheter should be undertaken when necessary if drainage stops   Plan:  Foley catheter can be discontinued when no longer necessary from a management perspective.  Inez Stantz S 07/01/2013, 8:50 PM      2

## 2013-07-01 NOTE — Progress Notes (Signed)
Subjective: Had significant bleeding overnight with hemoglobin drop and drop in blood pressure. Transferred to ICU.  Objective: Vital signs in last 24 hours: Temp:  [97.2 F (36.2 C)-98.7 F (37.1 C)] 97.5 F (36.4 C) (03/14 0944) Pulse Rate:  [53-113] 53 (03/14 1145) Resp:  [16-26] 22 (03/14 1145) BP: (72-109)/(25-71) 97/46 mmHg (03/14 1145) SpO2:  [93 %-100 %] 97 % (03/14 1145) Weight change:  Last BM Date: 06/30/13  PE: GEN:  Chronically frail- and ill-appearing, alert, a bit tachypneic-appearing ABD:  Soft  Lab Results: CBC    Component Value Date/Time   WBC 8.2 06/30/2013 0545   RBC 2.66* 06/30/2013 0545   HGB 6.4* 07/01/2013 0215   HCT 19.1* 07/01/2013 0215   PLT 244 06/30/2013 0545   MCV 94.0 06/30/2013 0545   MCH 30.8 06/30/2013 0545   MCHC 32.8 06/30/2013 0545   RDW 18.4* 06/30/2013 0545   LYMPHSABS 1.2 06/26/2013 1939   MONOABS 0.9 06/26/2013 1939   EOSABS 0.3 06/26/2013 1939   BASOSABS 0.1 06/26/2013 1939   CMP     Component Value Date/Time   NA 138 07/01/2013 0523   K 4.0 07/01/2013 0523   CL 104 07/01/2013 0523   CO2 15* 07/01/2013 0523   GLUCOSE 165* 07/01/2013 0523   BUN 61* 07/01/2013 0523   CREATININE 2.55* 07/01/2013 0523   CALCIUM 7.8* 07/01/2013 0523   PROT 6.0 06/27/2013 0305   ALBUMIN 2.9* 06/27/2013 0305   AST 17 06/27/2013 0305   ALT 13 06/27/2013 0305   ALKPHOS 96 06/27/2013 0305   BILITOT 0.6 06/27/2013 0305   GFRNONAA 22* 07/01/2013 0523   GFRAA 26* 07/01/2013 0523   INR ~ 2  Assessment:  1.  Recurrent GI bleeding in setting of coagulopathy.  Differential considerations include (presumed) recurrent diverticular bleeding versus post-polypectomy bleeding (1cm ascending colon polyp removed on colonoscopy yesterday). 2.  Therapeutic anticoagulation in setting of atrial fibrillation. 3.  Acute blood loss anemia.  Plan:  1.  Agree with transfer to ICU. 2.  Agree with reversal of coagulopathy. 3.  NPO except occasional ice chips. 4.  Once patient is  stabilized,  I have recommended nuclear bleeding study. 5.  If patient's bleeding persists without clear source despite above strategies, would consider colonoscopy in the next day or two to exclude possibility of post-polypectomy (which can be endoscopically treated) as opposed to presumed initial cause of bleeding (diverticular bleeding, which can't be treated). 6.  Will follow; case discussed with Dr. Delfina Redwood.   Landry Dyke 07/01/2013, 11:56 AM

## 2013-07-01 NOTE — Progress Notes (Signed)
Subjective: Patient is now in the intensive care unit, he had another GI bleed he is hypotensive and followup hemoglobin was 6. Labs reveal coagulopathy INR greater than 2. Patient has already received 2 units of packed red blood cells however had an additional bleeding shortly thereafter. Plans are for another 2 units, FFP and vitamin K. GI is aware upon stabilization plans are for a bleeding scan. Due to patient's hypotension and need for frequent monitoring critical care medicine has been asked to place central line. Patient is now ICU status, his care will be assumed by pulmonary critical care medicine  Objective: Vital signs in last 24 hours: Temp:  [97.2 F (36.2 C)-98.7 F (37.1 C)] 97.5 F (36.4 C) (03/14 0944) Pulse Rate:  [78-113] 111 (03/14 1017) Resp:  [18-22] 20 (03/14 1004) BP: (72-99)/(32-61) 78/40 mmHg (03/14 1017) SpO2:  [95 %-100 %] 100 % (03/14 0854) Weight change:  Last BM Date: 06/30/13  Intake/Output from previous day: 03/13 0701 - 03/14 0700 In: 480 [P.O.:480] Out: -  Intake/Output this shift: Total I/O In: 12.5 [Blood:12.5] Out: -   Resp: clear to auscultation bilaterally Cardio: irregularly irregular rhythm Extremities: No edema, cool extremities  Lab Results:  Results for orders placed during the hospital encounter of 06/26/13 (from the past 24 hour(s))  HEMOGLOBIN AND HEMATOCRIT, BLOOD     Status: Abnormal   Collection Time    07/01/13  2:15 AM      Result Value Ref Range   Hemoglobin 6.4 (*) 13.0 - 17.0 g/dL   HCT 19.1 (*) 39.0 - 52.0 %  PREPARE RBC (CROSSMATCH)     Status: None   Collection Time    07/01/13  3:24 AM      Result Value Ref Range   Order Confirmation ORDER PROCESSED BY BLOOD BANK    TYPE AND SCREEN     Status: None   Collection Time    07/01/13  4:07 AM      Result Value Ref Range   ABO/RH(D) O POS     Antibody Screen NEG     Sample Expiration 07/04/2013     Unit Number [id number]     Blood Component Type RED CELLS,LR      Unit division 00     Status of Unit ISSUED     Transfusion Status OK TO TRANSFUSE     Crossmatch Result Compatible     Unit Number [id number]     Blood Component Type RBC LR PHER1     Unit division 00     Status of Unit ISSUED     Transfusion Status OK TO TRANSFUSE     Crossmatch Result Compatible     Unit Number [id number]     Blood Component Type RED CELLS,LR     Unit division 00     Status of Unit ISSUED     Transfusion Status OK TO TRANSFUSE     Crossmatch Result Compatible     Unit Number [id number]     Blood Component Type RED CELLS,LR     Unit division 00     Status of Unit ALLOCATED     Transfusion Status OK TO TRANSFUSE     Crossmatch Result Compatible    PROTIME-INR     Status: Abnormal   Collection Time    07/01/13  5:23 AM      Result Value Ref Range   Prothrombin Time 23.8 (*) 11.6 - 15.2 seconds   INR 2.21 (*) 0.00 - 1.49  BASIC  METABOLIC PANEL     Status: Abnormal   Collection Time    07/01/13  5:23 AM      Result Value Ref Range   Sodium 138  137 - 147 mEq/L   Potassium 4.0  3.7 - 5.3 mEq/L   Chloride 104  96 - 112 mEq/L   CO2 15 (*) 19 - 32 mEq/L   Glucose, Bld 165 (*) 70 - 99 mg/dL   BUN 61 (*) 6 - 23 mg/dL   Creatinine, Ser 2.55 (*) 0.50 - 1.35 mg/dL   Calcium 7.8 (*) 8.4 - 10.5 mg/dL   GFR calc non Af Amer 22 (*) >90 mL/min   GFR calc Af Amer 26 (*) >90 mL/min  PREPARE RBC (CROSSMATCH)     Status: None   Collection Time    07/01/13 10:46 AM      Result Value Ref Range   Order Confirmation ORDER PROCESSED BY BLOOD BANK    PREPARE FRESH FROZEN PLASMA     Status: None   Collection Time    07/01/13 10:50 AM      Result Value Ref Range   Unit Number [id number]     Blood Component Type THAWED PLASMA     Unit division 00     Status of Unit ISSUED     Transfusion Status OK TO TRANSFUSE     Unit Number [id number]     Blood Component Type THAWED PLASMA     Unit division 00     Status of Unit ALLOCATED     Transfusion Status OK TO  TRANSFUSE        Studies/Results: No results found.  Medications:  Prior to Admission:  Prescriptions prior to admission  Medication Sig Dispense Refill  . albuterol (PROVENTIL HFA;VENTOLIN HFA) 108 (90 BASE) MCG/ACT inhaler Inhale 2 puffs into the lungs every 6 (six) hours as needed for wheezing or shortness of breath.      Marland Kitchen atorvastatin (LIPITOR) 40 MG tablet Take 40 mg by mouth daily at 6 PM.       . citric acid-sodium citrate (ORACIT) 334-500 MG/5ML solution Take 15 mLs by mouth every morning. 1 Tablespoon Daily      . colchicine 0.6 MG tablet Take 0.6 mg by mouth daily as needed (for gout).      . Cyanocobalamin (VITAMIN B-12 IJ) Inject 1 each as directed every 30 (thirty) days.      . famotidine (TH FAMOTIDINE 10) 10 MG tablet Take 10 mg by mouth every morning.      . febuxostat (ULORIC) 40 MG tablet Take 40 mg by mouth every morning.      . furosemide (LASIX) 40 MG tablet Take 40 mg by mouth every morning.      . metolazone (ZAROXOLYN) 2.5 MG tablet Take 2.5 mg by mouth daily as needed (for fluid).      . metoprolol tartrate (LOPRESSOR) 25 MG tablet Take 50 mg by mouth every morning.      . [DISCONTINUED] aspirin 81 MG tablet Take 81 mg by mouth every morning.      . [DISCONTINUED] Multiple Vitamins-Iron (MULTIVITAMINS WITH IRON) TABS tablet Take 1 tablet by mouth every evening.       . [DISCONTINUED] warfarin (COUMADIN) 5 MG tablet Take 5 mg by mouth every morning. Takes 5mg  on  Monday and Friday . Takes 2.5mg  all other days       Scheduled: . atorvastatin  40 mg Oral q1800  . docusate sodium  100 mg  Oral BID  . febuxostat  40 mg Oral q morning - 10a  . metoprolol tartrate  25 mg Oral BID  . pantoprazole  40 mg Oral Daily  . phytonadione (VITAMIN K) IV  5 mg Intravenous Once  . polyethylene glycol  17 g Oral BID   Continuous:  GYI:RSWNIO chloride, acetaminophen, albuterol, colchicine, HYDROcodone-acetaminophen, ondansetron (ZOFRAN) IV,  ondansetron  Assessment/Plan: Principal Problem:   Anemia, blood loss the patient is status post EGD and colonoscopy without a source for blood loss. He did undergo polypectomy yesterday. At this time he will be stabilized in intensive care unit, INR will be reversed, additional blood products FFP, vitamin K and packed red blood cells will be provided. Case has been discussed with GI, plans are for bleeding scan and possible repeat colonoscopy. Also as stated above pulmonary critical care medicine will assume care. I have discussed case with patient's wife in detail and the above multiple consultants in detail. 45 minutes were spent involved in the care of this case. Active Problems:   CKD (chronic kidney disease)   Aortic valve disorder   HTN (hypertension)   Atrial fibrillation   Coronary atherosclerosis of native coronary artery   Upper GI bleed   Malnutrition of moderate degree   Acute on chronic renal failure      LOS: 5 days   Khalessi Blough D 07/01/2013, 11:20 AM

## 2013-07-01 NOTE — Progress Notes (Signed)
CRITICAL VALUE ALERT  Critical value received:  Hgb level   Date of notification:  07/01/13  Time of notification:  2:15am  Critical value read back:6.4  Nurse who received alert: Mathews Robinsons  MD notified (1st page):  Dr. Delfina Redwood  Time of first page:  2:15am  MD notified (2nd page):  Time of second page:  Responding MD:  Polite  Time MD responded:  2:20am

## 2013-07-02 LAB — CBC
HCT: 20.4 % — ABNORMAL LOW (ref 39.0–52.0)
HCT: 21 % — ABNORMAL LOW (ref 39.0–52.0)
HCT: 19 % — ABNORMAL LOW (ref 39.0–52.0)
HEMOGLOBIN: 7.4 g/dL — AB (ref 13.0–17.0)
HEMOGLOBIN: 6.7 g/dL — AB (ref 13.0–17.0)
Hemoglobin: 7.1 g/dL — ABNORMAL LOW (ref 13.0–17.0)
MCH: 30.5 pg (ref 26.0–34.0)
MCH: 31 pg (ref 26.0–34.0)
MCH: 30.9 pg (ref 26.0–34.0)
MCHC: 34.8 g/dL (ref 30.0–36.0)
MCHC: 35.2 g/dL (ref 30.0–36.0)
MCHC: 35.3 g/dL (ref 30.0–36.0)
MCV: 87.6 fL (ref 78.0–100.0)
MCV: 87.9 fL (ref 78.0–100.0)
MCV: 87.6 fL (ref 78.0–100.0)
PLATELETS: 123 10*3/uL — AB (ref 150–400)
Platelets: 143 10*3/uL — ABNORMAL LOW (ref 150–400)
Platelets: 130 10*3/uL — ABNORMAL LOW (ref 150–400)
RBC: 2.33 MIL/uL — ABNORMAL LOW (ref 4.22–5.81)
RBC: 2.39 MIL/uL — AB (ref 4.22–5.81)
RBC: 2.17 MIL/uL — ABNORMAL LOW (ref 4.22–5.81)
RDW: 15.7 % — AB (ref 11.5–15.5)
RDW: 15.6 % — ABNORMAL HIGH (ref 11.5–15.5)
RDW: 15.6 % — AB (ref 11.5–15.5)
WBC: 11.2 10*3/uL — ABNORMAL HIGH (ref 4.0–10.5)
WBC: 14 10*3/uL — AB (ref 4.0–10.5)
WBC: 12.5 10*3/uL — AB (ref 4.0–10.5)

## 2013-07-02 LAB — PREPARE FRESH FROZEN PLASMA
UNIT DIVISION: 0
Unit division: 0

## 2013-07-02 LAB — PREPARE RBC (CROSSMATCH)

## 2013-07-02 LAB — BASIC METABOLIC PANEL
BUN: 60 mg/dL — AB (ref 6–23)
CHLORIDE: 104 meq/L (ref 96–112)
CO2: 18 meq/L — AB (ref 19–32)
Calcium: 7.5 mg/dL — ABNORMAL LOW (ref 8.4–10.5)
Creatinine, Ser: 2.7 mg/dL — ABNORMAL HIGH (ref 0.50–1.35)
GFR calc Af Amer: 24 mL/min — ABNORMAL LOW (ref >90)
GFR calc non Af Amer: 21 mL/min — ABNORMAL LOW (ref >90)
Glucose, Bld: 98 mg/dL (ref 70–99)
Potassium: 3.7 mEq/L (ref 3.7–5.3)
Sodium: 137 mEq/L (ref 137–147)

## 2013-07-02 LAB — PROTIME-INR
INR: 1.15 (ref 0.00–1.49)
INR: 1.2 (ref 0.00–1.49)
Prothrombin Time: 14.5 seconds (ref 11.6–15.2)
Prothrombin Time: 14.9 seconds (ref 11.6–15.2)

## 2013-07-02 LAB — MAGNESIUM: Magnesium: 2 mg/dL (ref 1.5–2.5)

## 2013-07-02 LAB — TROPONIN I: Troponin I: <0.3 ng/mL (ref <0.30)

## 2013-07-02 LAB — PHOSPHORUS: PHOSPHORUS: 3.7 mg/dL (ref 2.3–4.6)

## 2013-07-02 MED ORDER — TRANEXAMIC ACID 100 MG/ML IV SOLN
1000.0000 mg | Freq: Once | INTRAVENOUS | Status: AC
  Administered 2013-07-02: 1000 mg via INTRAVENOUS
  Filled 2013-07-02: qty 10

## 2013-07-02 MED ORDER — SODIUM CHLORIDE 0.9 % IV BOLUS (SEPSIS)
1000.0000 mL | Freq: Once | INTRAVENOUS | Status: AC
  Administered 2013-07-02: 1000 mL via INTRAVENOUS

## 2013-07-02 MED ORDER — PHENYLEPHRINE HCL 10 MG/ML IJ SOLN
30.0000 ug/min | INTRAMUSCULAR | Status: DC
Start: 2013-07-02 — End: 2013-07-03
  Administered 2013-07-02: 100 ug/min via INTRAVENOUS
  Administered 2013-07-02: 75 ug/min via INTRAVENOUS
  Administered 2013-07-02 (×2): 40 ug/min via INTRAVENOUS
  Administered 2013-07-03: 200 ug/min via INTRAVENOUS
  Filled 2013-07-02 (×4): qty 4

## 2013-07-02 NOTE — Consult Note (Addendum)
PULMONARY  / CRITICAL CARE MEDICINE  Name: [name] MRN: [id number] DOB: 01/26/1931 PCP Horton Finer, MD    ADMISSION DATE:  06/26/2013  LOS 6 days   CONSULTATION DATE:  07/01/13  REFERRING MD :  Dr Delfina Redwood  PRIMARY SERVICE: Sadie Haber -> PCCM  CHIEF COMPLAINT:  LGI bleed with hemodynamic instability  BRIEF PATIENT DESCRIPTION: 80 year old male with hx of polyps but refused screening scopies. Also A Fib and bioprosthetic aortic coumadin.s/p neo bladder early 200s Admitted 06/26/2013 wih 10 day shx of melena in setting of high INR 4. Underwent upper and lower endoscopies as above without sign of bleed. Also s/p polypectomnt Then on 07/01/13: had futher bleed but BRPR this time with bp/hr instability. Getting 3 units PRBC an 1 unit FFP and moed to ICU. PCCM now asuming primary service. GI wants tagged nuclear scan first.  Has one large bloody bowel movement after arrival to ICU  SIGNIFICANT EVENTS / STUDIES:  06/26/2013 - admit 3./11/15 - ugi scope - normal 06/30/13 - Colonsocpsy - ascending colon polypectomy. Scattered diverticulosis. Hemorrhoids. No active bleed 07/01/13 - Move tO ICU. Nuclear medicine scan negative for bleed. Foley inserted by Dr Risa Grill    LINES / TUBES: CVL 07/01/13 - Foley (Dr Risa Grill) 07/01/13  CULTURES: none  ANTIBIOTICS: Anti-infectives   None    \   SUBJECTIVE:   07/02/13: No active bleeding other than smeared blood in pad. Still perssor dependent on 175mcg of neo. More awake.  Nuclear scan negative for bleed. Hungry: wants to eat  VITAL SIGNS: Filed Vitals:   07/02/13 0900 07/02/13 1000 07/02/13 1100 07/02/13 1138  BP: 100/47 124/48 119/39   Pulse: 67 76 75   Temp:    98.5 F (36.9 C)  TempSrc:    Oral  Resp: 14 20 17    Height:      Weight:      SpO2: 100% 96% 100%          VENTILATOR SETTINGS:  INTAKE / OUTPUT: I/O last 3 completed shifts: In: 3207.4 [I.V.:2293.4; Blood:864; IV Piggyback:50] Out: 600 [Urine:600]      PHYSICAL EXAMINATION: General:  Frail, deconditioned, plesant Neuro:  AxOx3. Moves all 4s HEENT:  Neck supple Cardiovascular:  S1S2 +. No murmurs Lungs:  CTA bilaterally. No distress Abdomen:  surgial scar +. Soft, non tender, hyper active bowel sounds Musculoskeletal:  No cyanosis, no clubbing, no eema Skin:  intact  LABS: PULMONARY No results found for this basename: PHART, PCO2, PCO2ART, PO2, PO2ART, HCO3, TCO2, O2SAT,  in the last 168 hours  CBC  Recent Labs Lab 07/01/13 1900 07/01/13 1950 07/02/13 0716  HGB 8.0* 8.0* 7.1*  HCT 22.4* 22.9* 20.4*  WBC 12.6* 12.6* 11.2*  PLT 136* 140* 123*    COAGULATION  Recent Labs Lab 06/28/13 0523 06/29/13 0414 06/30/13 0545 07/01/13 0523 07/02/13 0716  INR 2.59* 2.12* 1.96* 2.21* 1.15    CARDIAC    Recent Labs Lab 06/27/13 0305 06/27/13 0700 06/27/13 1228 07/01/13 1500 07/01/13 1957  TROPONINI <0.30 <0.30 <0.30 <0.30 <0.30   No results found for this basename: PROBNP,  in the last 168 hours   CHEMISTRY  Recent Labs Lab 06/26/13 1939 06/27/13 0305 06/28/13 0523 06/30/13 0545 07/01/13 0523 07/01/13 1647 07/02/13 0716  NA 136* 139 141 140 138 139 137  K 3.9 4.1 3.6* 3.9 4.0 3.9 3.7  CL 99 102 105 104 104 109 104  CO2 21 20 21 21  15* 18* 18*  GLUCOSE 165* 113* 126* 101*  165* 143* 98  BUN 91* 89* 63* 47* 61* 63* 60*  CREATININE 2.78* 2.66* 2.30* 2.16* 2.55* 2.69* 2.70*  CALCIUM 8.6 8.4 8.6 8.9 7.8* 7.1* 7.5*  MG  --  2.3  --   --   --  2.0 2.0  PHOS  --  4.6  --   --   --  3.9 3.7   Estimated Creatinine Clearance: 21.1 ml/min (by C-G formula based on Cr of 2.7).   LIVER  Recent Labs Lab 06/26/13 1939 06/27/13 0305 06/28/13 0523 06/29/13 0414 06/30/13 0545 07/01/13 0523 07/02/13 0716  AST 20 17  --   --   --   --   --   ALT 16 13  --   --   --   --   --   ALKPHOS 99 96  --   --   --   --   --   BILITOT 0.5 0.6  --   --   --   --   --   PROT 6.5 6.0  --   --   --   --   --   ALBUMIN  3.0* 2.9*  --   --   --   --   --   INR 4.17* 4.05* 2.59* 2.12* 1.96* 2.21* 1.15     INFECTIOUS  Recent Labs Lab 07/01/13 1500  LATICACIDVEN 1.1     ENDOCRINE CBG (last 3)   Recent Labs  07/01/13 1031  GLUCAP 119*         IMAGING x48h  Nm Gi Blood Loss  07/01/2013   CLINICAL DATA:  GI bleed  EXAM: NUCLEAR MEDICINE GASTROINTESTINAL BLEEDING SCAN  TECHNIQUE: Sequential abdominal images were obtained following intravenous administration of Tc-72m labeled red blood cells.  RADIOPHARMACEUTICALS:  25MILLI CURIE ULTRATAG TECHNETIUM TC 46M-LABELED RED BLOOD CELLS IV KITmCi Tc-24m in-vitro labeled red cells.  COMPARISON:  None.  FINDINGS: Imaging was performed over 120 min.  Expected uptake is seen in the aortoiliac vasculature, the liver, urinary bladder, and heart. No changing/mobile radiotracer activity is seen in the expected location of the bowel during this two jour study to suggest an active bleed.  IMPRESSION: Negative.  No active gastrointestinal bleed identified.   Electronically Signed   By: Curlene Dolphin M.D.   On: 07/01/2013 15:47   Dg Chest Port 1 View  07/01/2013   CLINICAL DATA:  Central line placement  EXAM: PORTABLE CHEST - 1 VIEW  COMPARISON:  Prior chest x-ray 06/27/2013  FINDINGS: Newly placed left IJ central venous catheter. Catheter tip projects over the distal SVC. No evidence of pneumothorax. Trace left pleural effusion appears improved compared to prior imaging. There is residual left basilar atelectasis. The right lung is relatively clear. Background bronchitic changes and interstitial prominence are improved compared to prior. Stable cardiomegaly. Patient is status post median sternotomy with evidence of prior multivessel CABG. Aortic atherosclerosis again noted. No acute osseous abnormality.  IMPRESSION: 1. The tip of a left IJ approach central venous catheter projects over the distal SVC. No evidence of pneumothorax or other acute complication. 2. Slightly  improved pulmonary interstitial edema and decreasing left pleural effusion with residual left basilar atelectasis compared to 06/27/2013.   Electronically Signed   By: Jacqulynn Cadet M.D.   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 Source. UGI and LGI and nuclear medicine scan without source, s/p polypectomy this admit  P:   [facility] Dr Paulita Fujita following (d/w him) ? Needs repeat colonoscopy ? Needs repeat CT abd  NPO for now  HEMATOLOGIC A:  Anemia of bleeding. High INR from residual coumadin P:  PRBC for hgb < 7gm% or  Clinically based on bp/hr instability Stat CBC and then q12h   INFECTIOUS A:  No evidence of infection P:   monitor  ENDOCRINE A:  Nil acute P:   monitor  NEUROLOGIC A:  intact P:   monitor  TODAY'S SUMMARY: wife and daughter updated. D./w Dr Paulita Fujita who will come by.  Will give fluid bolus to get him off pressors    The patient is critically ill with multiple organ systems failure and requires high complexity decision making for assessment and support, frequent evaluation and titration of therapies, application of advanced monitoring technologies and extensive interpretation of multiple databases.   Critical Care Time devoted to patient care services described in this note is  35  Minutes.  Dr. Brand Males, M.D., [facility]P Pulmonary and Critical Care Medicine Staff  Physician [place] [facility] Pager: [phone], If no answer or between  15:00h - 7:00h: call [phone]  07/02/2013 11:42 AM

## 2013-07-02 NOTE — Progress Notes (Signed)
eLink Physician-Brief Progress Note Patient Name: [name] DOB: 17-Aug-1930 MRN: [id number]  Date of Service  07/02/2013   HPI/Events of Note   3 large bloody bowel movements since the beginning of shift   Recent Labs Lab 07/01/13 1900 07/01/13 1950 07/02/13 0716 07/02/13 1230 07/02/13 1940  HGB 8.0* 8.0* 7.1* 7.4* 6.7*    Recent Labs Lab 06/29/13 0414 06/30/13 0545 07/01/13 0523 07/02/13 0716 07/02/13 1600  INR 2.12* 1.96* 2.21* 1.15 1.20   Neo-Synephrine @ 30  Upper / lower endoscopy / nuclear bleeding scan - unrevealing   eICU Interventions   PRBC 2 units Tranexamic acid bolus and gtt     Intervention Category Major Interventions: Hemorrhage - evaluation and management;Shock - evaluation and management  Jesselyn Rask 07/02/2013, 8:22 PM

## 2013-07-02 NOTE — Procedures (Signed)
Supervised procedure yesterday at bedside  Dr. Brand Males, M.D., [facility].C.P Pulmonary and Critical Care Medicine Staff Physician [place] [facility] Pager: [phone], If no answer or between  15:00h - 7:00h: call [phone]  07/02/2013 10:17 AM

## 2013-07-02 NOTE — Progress Notes (Signed)
Subjective: No further bleeding. No abdominal pain.  Objective: Vital signs in last 24 hours: Temp:  [97.6 F (36.4 C)-98.5 F (36.9 C)] 98.5 F (36.9 C) (03/15 1138) Pulse Rate:  [38-153] 65 (03/15 1200) Resp:  [11-21] 19 (03/15 1200) BP: (71-152)/(31-93) 116/46 mmHg (03/15 1200) SpO2:  [79 %-100 %] 97 % (03/15 1200) Weight change:  Last BM Date: 07/01/13  PE: GEN:  Much more upbeat- and alert-appearing ABD:  Soft  Lab Results: CBC    Component Value Date/Time   WBC 11.2* 07/02/2013 0716   RBC 2.33* 07/02/2013 0716   HGB 7.1* 07/02/2013 0716   HCT 20.4* 07/02/2013 0716   PLT 123* 07/02/2013 0716   MCV 87.6 07/02/2013 0716   MCH 30.5 07/02/2013 0716   MCHC 34.8 07/02/2013 0716   RDW 15.7* 07/02/2013 0716   LYMPHSABS 1.2 06/26/2013 1939   MONOABS 0.9 06/26/2013 1939   EOSABS 0.3 06/26/2013 1939   BASOSABS 0.1 06/26/2013 1939   CMP     Component Value Date/Time   NA 137 07/02/2013 0716   K 3.7 07/02/2013 0716   CL 104 07/02/2013 0716   CO2 18* 07/02/2013 0716   GLUCOSE 98 07/02/2013 0716   BUN 60* 07/02/2013 0716   CREATININE 2.70* 07/02/2013 0716   CALCIUM 7.5* 07/02/2013 0716   PROT 6.0 06/27/2013 0305   ALBUMIN 2.9* 06/27/2013 0305   AST 17 06/27/2013 0305   ALT 13 06/27/2013 0305   ALKPHOS 96 06/27/2013 0305   BILITOT 0.6 06/27/2013 0305   GFRNONAA 21* 07/02/2013 0716   GFRAA 24* 07/02/2013 0716   INR 1.1  Studies/Results: Tagged RBC study:  No active source of bleeding identified  Assessment:  1. Recurrent GI bleeding in setting of coagulopathy. Bleeding resolved upon correction of coagulopathy.  Differential considerations include (presumed) recurrent diverticular bleeding versus post-polypectomy bleeding (1cm ascending colon polyp removed on colonoscopy yesterday).  2. Therapeutic anticoagulation in setting of atrial fibrillation. Reversed.  INR currently normal today. 3. Acute blood loss anemia.  Plan:  1.  Hold anticoagulation at this time; might consider cardiology  consult to help gauge risks of holding anticoagulation for a protracted period of time.  If felt to be relatively safe, I would suggest holding anticoagulation for at least the next couple weeks, given his recent and significant bleeding. 2.  Clear liquid diet. 3.  Serial CBCs, tranfuse as needed. 4.  [facility] will follow.   Landry Dyke 07/02/2013, 12:49 PM

## 2013-07-03 ENCOUNTER — Inpatient Hospital Stay (HOSPITAL_COMMUNITY): Payer: Medicare Other

## 2013-07-03 ENCOUNTER — Encounter (HOSPITAL_COMMUNITY)
Admission: EM | Disposition: A | Discharge status: Home Health | Payer: Self-pay | Source: Home / Self Care | Attending: Internal Medicine

## 2013-07-03 ENCOUNTER — Encounter (HOSPITAL_COMMUNITY): Payer: Self-pay | Admitting: Gastroenterology

## 2013-07-03 DIAGNOSIS — J9600 Acute respiratory failure, unspecified whether with hypoxia or hypercapnia: Secondary | ICD-10-CM

## 2013-07-03 DIAGNOSIS — D62 Acute posthemorrhagic anemia: Secondary | ICD-10-CM

## 2013-07-03 DIAGNOSIS — K625 Hemorrhage of anus and rectum: Secondary | ICD-10-CM

## 2013-07-03 DIAGNOSIS — K922 Gastrointestinal hemorrhage, unspecified: Secondary | ICD-10-CM

## 2013-07-03 HISTORY — PX: COLONOSCOPY WITH ESOPHAGOGASTRODUODENOSCOPY (EGD): SHX5779

## 2013-07-03 HISTORY — PX: ESOPHAGOGASTRODUODENOSCOPY: SHX5428

## 2013-07-03 HISTORY — PX: GIVENS CAPSULE STUDY: SHX5432

## 2013-07-03 LAB — URINALYSIS, ROUTINE W REFLEX MICROSCOPIC
Bilirubin Urine: NEGATIVE
Glucose, UA: NEGATIVE mg/dL
Ketones, ur: NEGATIVE mg/dL
NITRITE: NEGATIVE
PH: 6 (ref 5.0–8.0)
Protein, ur: 30 mg/dL — AB
Specific Gravity, Urine: 1.021 (ref 1.005–1.030)
Urobilinogen, UA: 0.2 mg/dL (ref 0.0–1.0)

## 2013-07-03 LAB — CBC
HCT: 24.3 % — ABNORMAL LOW (ref 39.0–52.0)
HCT: 26 % — ABNORMAL LOW (ref 39.0–52.0)
HCT: 25.4 % — ABNORMAL LOW (ref 39.0–52.0)
HCT: 24.9 % — ABNORMAL LOW (ref 39.0–52.0)
HCT: 24.6 % — ABNORMAL LOW (ref 39.0–52.0)
HEMOGLOBIN: 9.2 g/dL — AB (ref 13.0–17.0)
HEMOGLOBIN: 9 g/dL — AB (ref 13.0–17.0)
Hemoglobin: 8.6 g/dL — ABNORMAL LOW (ref 13.0–17.0)
Hemoglobin: 8.9 g/dL — ABNORMAL LOW (ref 13.0–17.0)
Hemoglobin: 8.8 g/dL — ABNORMAL LOW (ref 13.0–17.0)
MCH: 30.6 pg (ref 26.0–34.0)
MCH: 30.9 pg (ref 26.0–34.0)
MCH: 29.8 pg (ref 26.0–34.0)
MCH: 30.8 pg (ref 26.0–34.0)
MCH: 30.4 pg (ref 26.0–34.0)
MCHC: 35.4 g/dL (ref 30.0–36.0)
MCHC: 35.4 g/dL (ref 30.0–36.0)
MCHC: 35 g/dL (ref 30.0–36.0)
MCHC: 36.1 g/dL — ABNORMAL HIGH (ref 30.0–36.0)
MCHC: 35.8 g/dL (ref 30.0–36.0)
MCV: 86.5 fL (ref 78.0–100.0)
MCV: 87.2 fL (ref 78.0–100.0)
MCV: 84.9 fL (ref 78.0–100.0)
MCV: 85.3 fL (ref 78.0–100.0)
MCV: 85.1 fL (ref 78.0–100.0)
PLATELETS: 77 10*3/uL — AB (ref 150–400)
PLATELETS: 95 10*3/uL — AB (ref 150–400)
PLATELETS: 95 10*3/uL — AB (ref 150–400)
PLATELETS: 98 10*3/uL — AB (ref 150–400)
Platelets: 89 10*3/uL — ABNORMAL LOW (ref 150–400)
RBC: 2.81 MIL/uL — ABNORMAL LOW (ref 4.22–5.81)
RBC: 2.98 MIL/uL — AB (ref 4.22–5.81)
RBC: 2.99 MIL/uL — ABNORMAL LOW (ref 4.22–5.81)
RBC: 2.92 MIL/uL — ABNORMAL LOW (ref 4.22–5.81)
RBC: 2.89 MIL/uL — AB (ref 4.22–5.81)
RDW: 14.5 % (ref 11.5–15.5)
RDW: 14.6 % (ref 11.5–15.5)
RDW: 14.8 % (ref 11.5–15.5)
RDW: 15.1 % (ref 11.5–15.5)
RDW: 15.2 % (ref 11.5–15.5)
WBC: 17.4 10*3/uL — ABNORMAL HIGH (ref 4.0–10.5)
WBC: 19.2 10*3/uL — ABNORMAL HIGH (ref 4.0–10.5)
WBC: 20.2 10*3/uL — AB (ref 4.0–10.5)
WBC: 21.7 10*3/uL — ABNORMAL HIGH (ref 4.0–10.5)
WBC: 22.1 10*3/uL — ABNORMAL HIGH (ref 4.0–10.5)

## 2013-07-03 LAB — COMPREHENSIVE METABOLIC PANEL
ALT: 9 U/L (ref 0–53)
AST: 13 U/L (ref 0–37)
Albumin: 1.2 g/dL — ABNORMAL LOW (ref 3.5–5.2)
Alkaline Phosphatase: 42 U/L (ref 39–117)
BUN: 56 mg/dL — ABNORMAL HIGH (ref 6–23)
CO2: 13 meq/L — AB (ref 19–32)
Calcium: 6.1 mg/dL — CL (ref 8.4–10.5)
Chloride: 114 mEq/L — ABNORMAL HIGH (ref 96–112)
Creatinine, Ser: 2.29 mg/dL — ABNORMAL HIGH (ref 0.50–1.35)
GFR calc Af Amer: 29 mL/min — ABNORMAL LOW (ref >90)
GFR calc non Af Amer: 25 mL/min — ABNORMAL LOW (ref >90)
Glucose, Bld: 170 mg/dL — ABNORMAL HIGH (ref 70–99)
Potassium: 4.1 mEq/L (ref 3.7–5.3)
Sodium: 140 mEq/L (ref 137–147)
TOTAL PROTEIN: 2.9 g/dL — AB (ref 6.0–8.3)
Total Bilirubin: 1.1 mg/dL (ref 0.3–1.2)

## 2013-07-03 LAB — BASIC METABOLIC PANEL
BUN: 56 mg/dL — ABNORMAL HIGH (ref 6–23)
CO2: 12 mEq/L — ABNORMAL LOW (ref 19–32)
Calcium: 6.5 mg/dL — ABNORMAL LOW (ref 8.4–10.5)
Chloride: 111 mEq/L (ref 96–112)
Creatinine, Ser: 2.43 mg/dL — ABNORMAL HIGH (ref 0.50–1.35)
GFR calc non Af Amer: 24 mL/min — ABNORMAL LOW (ref >90)
GFR, EST AFRICAN AMERICAN: 27 mL/min — AB (ref >90)
Glucose, Bld: 188 mg/dL — ABNORMAL HIGH (ref 70–99)
POTASSIUM: 3.9 meq/L (ref 3.7–5.3)
Sodium: 137 mEq/L (ref 137–147)

## 2013-07-03 LAB — APTT: aPTT: 33 seconds (ref 24–37)

## 2013-07-03 LAB — GLUCOSE, CAPILLARY
Glucose-Capillary: 209 mg/dL — ABNORMAL HIGH (ref 70–99)
Glucose-Capillary: 209 mg/dL — ABNORMAL HIGH (ref 70–99)
Glucose-Capillary: 213 mg/dL — ABNORMAL HIGH (ref 70–99)
Glucose-Capillary: 248 mg/dL — ABNORMAL HIGH (ref 70–99)

## 2013-07-03 LAB — POCT I-STAT 3, ART BLOOD GAS (G3+)
Acid-base deficit: 12 mmol/L — ABNORMAL HIGH (ref 0.0–2.0)
Acid-base deficit: 13 mmol/L — ABNORMAL HIGH (ref 0.0–2.0)
Acid-base deficit: 11 mmol/L — ABNORMAL HIGH (ref 0.0–2.0)
Bicarbonate: 14.8 mEq/L — ABNORMAL LOW (ref 20.0–24.0)
Bicarbonate: 12.6 mEq/L — ABNORMAL LOW (ref 20.0–24.0)
Bicarbonate: 14.5 mEq/L — ABNORMAL LOW (ref 20.0–24.0)
O2 Saturation: 100 %
O2 Saturation: 99 %
O2 Saturation: 99 %
PCO2 ART: 35.7 mmHg (ref 35.0–45.0)
PCO2 ART: 28.6 mmHg — AB (ref 35.0–45.0)
PCO2 ART: 29.8 mmHg — AB (ref 35.0–45.0)
PH ART: 7.254 — AB (ref 7.350–7.450)
PH ART: 7.293 — AB (ref 7.350–7.450)
PO2 ART: 425 mmHg — AB (ref 80.0–100.0)
PO2 ART: 170 mmHg — AB (ref 80.0–100.0)
PO2 ART: 143 mmHg — AB (ref 80.0–100.0)
Patient temperature: 98.2
Patient temperature: 98.6
Patient temperature: 98.1
TCO2: 16 mmol/L (ref 0–100)
TCO2: 13 mmol/L (ref 0–100)
TCO2: 15 mmol/L (ref 0–100)
pH, Arterial: 7.223 — ABNORMAL LOW (ref 7.350–7.450)

## 2013-07-03 LAB — URINE MICROSCOPIC-ADD ON

## 2013-07-03 LAB — PROTIME-INR
INR: 1.44 (ref 0.00–1.49)
PROTHROMBIN TIME: 17.2 s — AB (ref 11.6–15.2)

## 2013-07-03 LAB — LACTIC ACID, PLASMA
Lactic Acid, Venous: 3.5 mmol/L — ABNORMAL HIGH (ref 0.5–2.2)
Lactic Acid, Venous: 1.6 mmol/L (ref 0.5–2.2)
Lactic Acid, Venous: 0.9 mmol/L (ref 0.5–2.2)

## 2013-07-03 LAB — FIBRINOGEN: Fibrinogen: 197 mg/dL — ABNORMAL LOW (ref 204–475)

## 2013-07-03 LAB — TROPONIN I: Troponin I: <0.3 ng/mL (ref <0.30)

## 2013-07-03 LAB — MAGNESIUM: Magnesium: 1.8 mg/dL (ref 1.5–2.5)

## 2013-07-03 LAB — PREPARE RBC (CROSSMATCH)

## 2013-07-03 LAB — PHOSPHORUS: Phosphorus: 4.2 mg/dL (ref 2.3–4.6)

## 2013-07-03 LAB — PRO B NATRIURETIC PEPTIDE: Pro B Natriuretic peptide (BNP): 7905 pg/mL — ABNORMAL HIGH (ref 0–450)

## 2013-07-03 SURGERY — IMAGING PROCEDURE, GI TRACT, INTRALUMINAL, VIA CAPSULE
Anesthesia: LOCAL

## 2013-07-03 SURGERY — COLONOSCOPY WITH ESOPHAGOGASTRODUODENOSCOPY (EGD)
Anesthesia: Moderate Sedation

## 2013-07-03 MED ORDER — SODIUM BICARBONATE 8.4 % IV SOLN
INTRAVENOUS | Status: DC
  Administered 2013-07-03: 06:00:00 via INTRAVENOUS
  Filled 2013-07-03: qty 150

## 2013-07-03 MED ORDER — SODIUM CHLORIDE 0.9 % IV BOLUS (SEPSIS)
1000.0000 mL | Freq: Once | INTRAVENOUS | Status: AC
  Administered 2013-07-03: 1000 mL via INTRAVENOUS

## 2013-07-03 MED ORDER — SODIUM CHLORIDE 0.9 % IV SOLN: INTRAVENOUS | Status: DC

## 2013-07-03 MED ORDER — MIDAZOLAM HCL 2 MG/2ML IJ SOLN: 4.0000 mg | Freq: Once | INTRAMUSCULAR | Status: DC

## 2013-07-03 MED ORDER — FENTANYL BOLUS VIA INFUSION
25.0000 ug | INTRAVENOUS | Status: DC | PRN
  Filled 2013-07-03: qty 50

## 2013-07-03 MED ORDER — CEFAZOLIN SODIUM-DEXTROSE 2-3 GM-% IV SOLR
INTRAVENOUS | Status: AC
  Administered 2013-07-03: 2000 mg
  Filled 2013-07-03: qty 50

## 2013-07-03 MED ORDER — FENTANYL CITRATE 0.05 MG/ML IJ SOLN: 200.0000 ug | Freq: Once | INTRAMUSCULAR | Status: DC

## 2013-07-03 MED ORDER — FENTANYL CITRATE 0.05 MG/ML IJ SOLN
INTRAMUSCULAR | Status: AC
  Filled 2013-07-03: qty 2

## 2013-07-03 MED ORDER — FENTANYL CITRATE 0.05 MG/ML IJ SOLN
0.0000 ug/h | INTRAMUSCULAR | Status: DC
  Administered 2013-07-03: 50 ug/h via INTRAVENOUS
  Filled 2013-07-03: qty 50

## 2013-07-03 MED ORDER — MIDAZOLAM HCL 2 MG/2ML IJ SOLN
INTRAMUSCULAR | Status: AC
  Administered 2013-07-03: 4 mg
  Filled 2013-07-03: qty 2

## 2013-07-03 MED ORDER — DEXTROSE 5 % IV SOLN
1.0000 g | Freq: Two times a day (BID) | INTRAVENOUS | Status: DC
  Administered 2013-07-03 – 2013-07-05 (×5): 1 g via INTRAVENOUS
  Filled 2013-07-03 (×6): qty 1

## 2013-07-03 MED ORDER — MIDAZOLAM HCL 2 MG/2ML IJ SOLN
INTRAMUSCULAR | Status: AC
  Filled 2013-07-03: qty 2

## 2013-07-03 MED ORDER — IOHEXOL 300 MG/ML  SOLN
150.0000 mL | Freq: Once | INTRAMUSCULAR | Status: AC | PRN
  Administered 2013-07-03: 120 mL via INTRAVENOUS

## 2013-07-03 MED ORDER — PROPOFOL 10 MG/ML IV BOLUS
INTRAVENOUS | Status: AC
  Administered 2013-07-03: 50 mg via INTRAMUSCULAR
  Filled 2013-07-03: qty 20

## 2013-07-03 MED ORDER — PROPOFOL 10 MG/ML IV BOLUS: 50.0000 mg | Freq: Once | INTRAVENOUS | Status: DC

## 2013-07-03 MED ORDER — FENTANYL CITRATE 0.05 MG/ML IJ SOLN
INTRAMUSCULAR | Status: AC | PRN
  Administered 2013-07-03 (×3): 25 ug via INTRAVENOUS

## 2013-07-03 MED ORDER — FENTANYL CITRATE 0.05 MG/ML IJ SOLN
INTRAMUSCULAR | Status: AC
  Administered 2013-07-03: 01:00:00
  Filled 2013-07-03: qty 4

## 2013-07-03 MED ORDER — VANCOMYCIN HCL IN DEXTROSE 750-5 MG/150ML-% IV SOLN
750.0000 mg | INTRAVENOUS | Status: DC
  Administered 2013-07-04 – 2013-07-05 (×2): 750 mg via INTRAVENOUS
  Filled 2013-07-03 (×3): qty 150

## 2013-07-03 MED ORDER — INSULIN ASPART 100 UNIT/ML ~~LOC~~ SOLN
1.0000 [IU] | SUBCUTANEOUS | Status: DC
  Administered 2013-07-03: 1 [IU] via SUBCUTANEOUS
  Administered 2013-07-03: 3 [IU] via SUBCUTANEOUS

## 2013-07-03 MED ORDER — PEG 3350-KCL-NA BICARB-NACL 420 G PO SOLR
2000.0000 mL | Freq: Once | ORAL | Status: AC
  Administered 2013-07-03: 2000 mL
  Filled 2013-07-03: qty 4000

## 2013-07-03 MED ORDER — NOREPINEPHRINE BITARTRATE 1 MG/ML IJ SOLN
2.0000 ug/min | INTRAVENOUS | Status: DC
  Administered 2013-07-03: 20 ug/min via INTRAVENOUS
  Administered 2013-07-03: 10 ug/min via INTRAVENOUS
  Administered 2013-07-03: 22 ug/min via INTRAVENOUS
  Filled 2013-07-03 (×3): qty 8

## 2013-07-03 MED ORDER — METOCLOPRAMIDE HCL 5 MG/ML IJ SOLN
10.0000 mg | Freq: Once | INTRAMUSCULAR | Status: AC
  Administered 2013-07-03: 10 mg via INTRAVENOUS
  Filled 2013-07-03: qty 2

## 2013-07-03 MED ORDER — MIDAZOLAM HCL 2 MG/2ML IJ SOLN
INTRAMUSCULAR | Status: AC | PRN
  Administered 2013-07-03: 1 mg via INTRAVENOUS
  Administered 2013-07-03 (×5): 0.5 mg via INTRAVENOUS

## 2013-07-03 MED ORDER — VANCOMYCIN HCL IN DEXTROSE 1-5 GM/200ML-% IV SOLN
1000.0000 mg | Freq: Once | INTRAVENOUS | Status: AC
  Administered 2013-07-03: 1000 mg via INTRAVENOUS
  Filled 2013-07-03: qty 200

## 2013-07-03 MED ORDER — MIDAZOLAM HCL 2 MG/2ML IJ SOLN
INTRAMUSCULAR | Status: AC
  Administered 2013-07-03: 4 mg via INTRAVENOUS
  Filled 2013-07-03: qty 4

## 2013-07-03 MED ORDER — SODIUM BICARBONATE 8.4 % IV SOLN
INTRAVENOUS | Status: DC
  Administered 2013-07-03 – 2013-07-04 (×3): via INTRAVENOUS
  Filled 2013-07-03 (×7): qty 50

## 2013-07-03 MED ORDER — HYDROCORTISONE NA SUCCINATE PF 100 MG IJ SOLR
50.0000 mg | Freq: Four times a day (QID) | INTRAMUSCULAR | Status: DC
  Administered 2013-07-03 – 2013-07-04 (×6): 50 mg via INTRAVENOUS
  Filled 2013-07-03 (×9): qty 1

## 2013-07-03 MED ORDER — FENTANYL CITRATE 0.05 MG/ML IJ SOLN: 50.0000 ug | Freq: Once | INTRAMUSCULAR | Status: AC

## 2013-07-03 MED ORDER — MIDAZOLAM HCL 5 MG/ML IJ SOLN
INTRAMUSCULAR | Status: AC
  Filled 2013-07-03: qty 2

## 2013-07-03 SURGICAL SUPPLY — 1 items: TOWEL COTTON PACK 4EA (MISCELLANEOUS) ×6 IMPLANT

## 2013-07-03 NOTE — Procedures (Signed)
Interventional Radiology Procedure Note  Procedure: Selective visceral angiogram with interrogation of the common hepatic, superior mesenteric, right colic, middle colic and inferior mesenteric arteries.  No source for active bleeding identified.   Complications:  None immediate Recommendations: - 37F sheath left in right common femoral artery to use for arterial pressure measurements - Continue supportive care and transfusion as needed  Signed,  Criselda Peaches, MD [facility]

## 2013-07-03 NOTE — Op Note (Signed)
[facility] [place], [id number]   ENDOSCOPY PROCEDURE REPORT  PATIENT: [name], [name]  MR#: [id number] BIRTHDATE: 1930-12-21 , 82  yrs. old GENDER: Male ENDOSCOPIST:Demarea Lorey Amedeo Plenty, MD REFERRED BY: PROCEDURE DATE:  07/03/2013 PROCEDURE: ASA CLASS: INDICATIONS: GI bleeding MEDICATION:    general anesthesia TOPICAL ANESTHETIC:  DESCRIPTION OF PROCEDURE:   esophagus: Normal  stomach: Normal Duodenum: Normal Givens video capsule endoscope placed with endoscopically attached deployment device in the distal antrum. Could not get deployment advice beyond the pylorus but subsequently to get the regular endoscope passed it.     COMPLICATIONS: None  ENDOSCOPIC IMPRESSION:basically normal upper endoscopy with no potential bleeding sources  RECOMMENDATIONS:continue to monitor stools and hemoglobin, repeat capsule endoscopy report tomorrow.    _______________________________ Lorrin MaisTeena Irani, MD 07/03/2013 5:04 PM

## 2013-07-03 NOTE — Procedures (Signed)
Intubation Procedure Note MARIEL GAUDIN [id number] February 14, 1931  Procedure: Intubation Indications: Airway protection and maintenance  Procedure Details Consent: Risks of procedure as well as the alternatives and risks of each were explained to the (patient/caregiver).  Consent for procedure obtained. Time Out: Verified patient identification, verified procedure, site/side was marked, verified correct patient position, special equipment/implants available, medications/allergies/relevent history reviewed, required imaging and test results available.  Performed  Maximum sterile technique was used including gloves and mask.   Glidescope 3 used. Induced with 50 Propofol, 200 mcg Fentanyl and Paralyzed with 40 Roc. Grade 1 view.    Evaluation Hemodynamic Status: Persistent hypotension treated with pressors and fluid; O2 sats: transiently fell during during procedure Patient's Current Condition: stable Complications: No apparent complications Patient did tolerate procedure well. Chest X-ray ordered to verify placement.  CXR: pending.   Talli Kimmer R. 07/03/2013

## 2013-07-03 NOTE — Consult Note (Signed)
Pt seen examined.  Chart reviewed.  No source yet identified. Will follow. D/w family and CCM.

## 2013-07-03 NOTE — Progress Notes (Signed)
ANTIBIOTIC CONSULT NOTE - INITIAL  Pharmacy Consult for Fortaz, Vancomycin Indication: r/o transient bacteremia from fistula  Allergies  Allergen Reactions  . Ramipril Other (See Comments)    unknown   Patient Measurements: Height: 5\' 8"  (172.7 cm) Weight: 166 lb 3.6 oz (75.4 kg) IBW/kg (Calculated) : 68.4 Vital Signs: Temp: 98.3 F (36.8 C) (03/16 1245) Temp src: Oral (03/16 1245) BP: 130/30 mmHg (03/16 1200) Pulse Rate: 109 (03/16 1215) Intake/Output from previous day: 03/15 0701 - 03/16 0700 In: 1510.5 [I.V.:1096.7; Blood:350; IV Piggyback:63.8] Out: 6237 [Urine:1550; Stool:15] Intake/Output from this shift: Total I/O In: 1468.3 [I.V.:550.8; Blood:267.5; NG/GT:400; IV Piggyback:250] Out: -   Labs:  Recent Labs  07/02/13 0716  07/02/13 1940 07/03/13 0100 07/03/13 0500 07/03/13 0750 07/03/13 0808  WBC 11.2*  < > 12.5*  --   --  19.2* 17.4*  HGB 7.1*  < > 6.7*  --   --  9.2* 8.6*  PLT 123*  < > 130*  --   --  95* 77*  CREATININE 2.70*  --   --  2.29* 2.43*  --   --   < > = values in this interval not displayed. Estimated Creatinine Clearance: 23.5 ml/min (by C-G formula based on Cr of 2.43).   Microbiology: Recent Results (from the past 720 hour(s))  MRSA PCR SCREENING     Status: None   Collection Time    06/27/13  1:40 AM      Result Value Ref Range Status   MRSA by PCR NEGATIVE  NEGATIVE Final   Comment:            The GeneXpert MRSA Assay (FDA     approved for NASAL specimens     only), is one component of a     comprehensive MRSA colonization     surveillance program. It is not     intended to diagnose MRSA     infection nor to guide or     monitor treatment for     MRSA infections.    Medical History: Past Medical History  Diagnosis Date  . Aortic valve disorder     s/p [facility]AVR at [facility]   . PVD (peripheral vascular disease)   . Hypercholesteremia   . HTN (hypertension)   . Atrial fibrillation     Chronic  . Atrial flutter   . CKD (chronic  kidney disease)   . AAA (abdominal aortic aneurysm)   . 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 CAD (coronary artery disease)     Status post CABG, stent to diagonal prior to TAVR  . Bladder cancer   . Gallstone   . Vitamin B12 deficiency   . Colon cancer   . Carotid stenosis   . Gout   . Gout    Assessment: 71 YOM with hx enteric fistula and continued shock to start empiric vancomycin and ceftazidime. WBC 17.4. Afebrile. Blood cultures pending. SCr slightly improved at 2.43 (peak ~2.7)/estimated CrCl~20-67mL/min.   Goal of Therapy:  Vancomycin trough level 15-20 mcg/ml  Plan:  1. Vancomycin 1g now, then 750mg  IV q24h.  2. Tressie Ellis 1g IV q12h.  3. Follow-up renal function, cultures, clinical status.   Sloan Leiter, PharmD, BCPS Clinical Pharmacist [phone] 07/03/2013,12:50 PM

## 2013-07-03 NOTE — Progress Notes (Signed)
NUTRITION FOLLOW UP  DOCUMENTATION CODES  Per approved criteria   -Non-severe (moderate) malnutrition in the context of acute illness or injury    Intervention:    When able to begin enteral nutrition, recommend utilize 10M PEPuP Protocol: initiate TF via OGT with Vital AF 1.2 at 25 ml/h on day 1; on day 2, increase to goal rate of 60 ml/h (1440 ml per day) to provide 1728 kcals, 108 gm protein, 1168 ml free water daily.  New Nutrition Dx:   Inadequate oral intake related to inability to eat and altered GI function as evidenced by NPO status.  Goal:   Intake to meet >90% of estimated nutrition needs. Unmet.  Monitor:   Ability to start enteral nutrition, weight trend, labs, vent status.  Assessment:   Patient with PMH of PVD, HTN, A-fib, CKD, AAA and colon cancer; presented with 10 day hx of black stools and fatigue; evaluated by PCP and had CBC showing Hg of 6.3 -- asked to come to ER. Diagnosed with moderate malnutrition in the context of acute illness on admission. Underwent upper and lower endoscopies as above without sign of bleed. Also s/p polypectomnt Then on 07/01/13: had futher bleed but BRPR this time with bp/hr instability.  Transferred to the MICU on 3/14.   Patient is currently intubated on ventilator support.  MV: 13.7 L/min Temp (24hrs), Avg:98.1 F (36.7 C), Min:97.9 F (36.6 C), Max:98.6 F (37 C)   Height: Ht Readings from Last 1 Encounters:  07/01/13 5\' 8"  (1.727 m)    Weight Status:   Wt Readings from Last 1 Encounters:  06/27/13 166 lb 3.6 oz (75.4 kg)    Re-estimated needs:  Kcal: 1800 Protein: 100-115 gm Fluid: 1.8-2 L  Skin: right groin incision  Diet Order:  NPO   Intake/Output Summary (Last 24 hours) at 07/03/13 1556 Last data filed at 07/03/13 1428  Gross per 24 hour  Intake 4251.37 ml  Output    915 ml  Net 3336.37 ml    Last BM: 3/16   Labs:   Recent Labs Lab 07/01/13 1647 07/02/13 0716 07/03/13 0100 07/03/13 0500  NA  139 137 140 137  K 3.9 3.7 4.1 3.9  CL 109 104 114* 111  CO2 18* 18* 13* 12*  BUN 63* 60* 56* 56*  CREATININE 2.69* 2.70* 2.29* 2.43*  CALCIUM 7.1* 7.5* 6.1* 6.5*  MG 2.0 2.0  --  1.8  PHOS 3.9 3.7  --  4.2  GLUCOSE 143* 98 170* 188*    CBG (last 3)   Recent Labs  07/01/13 1031 07/03/13 1213  GLUCAP 119* 248*    Scheduled Meds: . antiseptic oral rinse  15 mL Mouth Rinse q12n4p  . cefTAZidime (FORTAZ)  IV  1 g Intravenous Q12H  . chlorhexidine  15 mL Mouth Rinse BID  . febuxostat  40 mg Oral q morning - 10a  . fentaNYL  200 mcg Intravenous Once  . hydrocortisone sodium succinate  50 mg Intravenous Q6H  . insulin aspart  1-3 Units Subcutaneous 6 times per day  . pantoprazole (PROTONIX) IV  40 mg Intravenous Q1200  . polyethylene glycol  17 g Oral BID  . [START ON 07/04/2013] vancomycin  750 mg Intravenous Q24H    Continuous Infusions: . fentaNYL infusion INTRAVENOUS 50 mcg/hr (07/03/13 0914)  . norepinephrine (LEVOPHED) Adult infusion 22 mcg/min (07/03/13 1322)  .  sodium bicarbonate  infusion 1000 mL 100 mL/hr at 07/03/13 0911     Molli Barrows, RD, LDN, CNSC  Pager [phone] After Hours Pager [phone]

## 2013-07-03 NOTE — Op Note (Signed)
[facility] [place], [id number]   OPERATIVE PROCEDURE REPORT  PATIENT: [name], [name]  MR#: [id number] BIRTHDATE: 11-04-1930  GENDER: Male ENDOSCOPIST: Teena Irani, MD ASSISTANT:   Jiles Harold, technician Verlon Au, RN, BSN PROCEDURE DATE: 07/03/2013 PROCEDURE: ASA CLASS:   4 INDICATIONS:severe GI bleeding MEDICATIONS: general anesthesia  DESCRIPTION OF PROCEDURE:   After the risks benefits and alternatives of the procedure were thoroughly explained, informed consent was obtained.        The Pentax Ped Colon [id number] endoscope was introduced through the anus  and advanced to the cecum      , No adverse events experienced.    The quality of the prep was poor do to active bleeding with old blood and clots throughout the colon      . this was consistent in most areas of the colon. Visualization was poor in many areas despite vigorous lavage and suction. The ileocecal valve was difficult to identify do to pooling blood and I could never intubate the terminal ileum. It was not clear whether blood was coming from the terminal ileum into the cecum but there is some fairly fresh-appearing blood in the cecum. Fairly fresh and old blood and clots were seen throughout the colon but with a little greater amounts in the distal half of the colon. There were numerous diverticuli which appeared to be primarily in the left colon.  I was unable to localize the previous a descending colon polypectomy site despite extensive searching and vigorous lavage. There was a lot of blood that could not be adequately suctioned. The instrument was then slowly withdrawn as the colon was fully examined.   The scope was then withdrawn from the patient and the procedure terminated.  COMPLICATIONS: There were no complications.  IMPRESSION:old and fresh blood and clots throughout the colon with failure to localize the site or source of GI bleeding .  previous polypectomysite not seen. Multiple diverticuli primarily in the descending and sigmoid colon. RECOMMENDATIONS:proceed to EGD with capsule endoscopy placement.  _______________________________ eSignedTeena Irani, MD 07/03/2013 5:01 PM

## 2013-07-03 NOTE — Progress Notes (Signed)
Patient had, by report, approximately 14 bloody bowel movements through the night, associated with hypotension, acidosis, and need for intubation. Patient now sedated on ventilator, on Levophed infusion to maintain systolic pressure around 540.   He has received 4 units of packed cells, with his hemoglobin rising from a level of 6.7 to a current level of 9.2. His platelets have fallen to 95,000.   Yesterday's bleeding scan was negative, but he went on to an arteriogram around 4:00 this morning, which also was negative by report (final report on computer not yet available).  The patient is in renal failure. 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  There may be some past history of an abdominal aortic aneurysm, for which he is undergoing an abdominal ultrasound at this time.  Exam: Sedated on ventilator, heart rate irregular approximately 981, systolic pressure 19-147. Chest clear. Heart without murmur. Abdomen soft and nontender, bowel sounds present and nonobstructive in character.  Impression: Recurrent bleeding, apparently without active bleeding at this time, but with evidence of volume contraction characterized by severe hypotension requiring pressors. I suspect that his hemoglobin is artificially high, due to absence of equilibration. The patient exhibits multisystem dysfunction and is clearly critically ill.  Recommendation: The patient initially presented with bleeding last week, he was over anticoagulated, and no discrete source of bleeding was seen on endoscopy or colonoscopy. He does have diverticulosis which would be a potential cause.   In any event, now he is status post polypectomy, and the recurrence of bleeding, off anticoagulation, following a polypectomy brings to mind at least the possibility of post-polypectomy hemorrhage.   Because of the severity of his bleeding and ongoing evidence of clinical instability, I feel that diagnostic, and potentially  therapeutic, endoscopic evaluation is warranted. I would favor starting with colonoscopy to see if we can find and "clear" his polypectomy site. If there is evidence of bleeding from a more proximal source, I would then favor endoscopic evaluation with deployment of a small bowel video capsule into the duodenum.  I have discussed all this with Dr. Titus Mould of the critical care service, my partner Dr. Teena Irani who would be doing the procedure this afternoon, with the patient's wife and daughters at the bedside, and with the endoscopy staff. Everyone seems to be in agreement with this approach. The risks of endoscopic procedures were again discussed with the patient's wife.  Total time for today's encounter, including coordination of care, was approximately 45 minutes.  Cleotis Nipper, M.D. [phone]

## 2013-07-03 NOTE — Consult Note (Signed)
[name] [birth year]  [id number].    Requesting MD: Dr. Titus Mould Chief Complaint/Reason for Consult: Lower GI bleeding  HPI:  The patient is intubated and critically ill and no family members were at bedside to aid in the history taking, thus most of the history was obtained from the chart, nursing, and involved medical providers.    80 y.o. White male admitted to [facility] for melena which started  10 days prior to admission on 06/27/13, 1-2 episodes per day. No abdominal pain, hematemesis, hematochezia. No change in bowel habits. Has been progressively weak over the past few weeks. Thinks his appetite has diminished, some subjective weight loss.  He Is on warfarin for atrial fibrillation and bioprosthetic aortic valve, admission INR ~ 4.0 (now down to 1.4). No prior endoscopy. Colonoscopy in 2011 by Dr. Wynetta Emery showed left-sided diverticulosis, and several medium-sized adenomatous polyps, with one year colon recall suggested, but the patient never followed up with those recommendations.    During his admission he had a UGI which was normal on 06/28/13, a colonoscopy on 06/30/13 in which an ascending colon polypectomy occurred.  Scattered diverticulosis and hemorrhoids, but no active bleeding.  He was transferred to the ICU and a NM scan was done, but negative for bleed.  Dr. Risa Grill inserted a foley due to phimosis.  On 07/02/13 he was intubated and taken to angio due to continued bleeding which was negative.  On 07/03/13 he remains intubated on pressors and has had multiple bloody BM's and administration of 4 units pRBC's.  We were asked to come on board in case surgical intervention was needed.  GI (Dr. Amedeo Plenty) is planning for a repeat colonoscopy to check for post-polypectomy hemorrhage.     ROS: All systems reviewed and otherwise negative except for as above  Family History  Problem Relation Age of Onset  . Cancer Mother     Colon cancer  . Heart disease Father   . Diabetes Father   . Cancer  Brother     esophageal    Past Medical History  Diagnosis Date  . Aortic valve disorder     s/p TAVR at [facility]   . PVD (peripheral vascular disease)   . Hypercholesteremia   . HTN (hypertension)   . Atrial fibrillation     Chronic  . Atrial flutter   . CKD (chronic kidney disease)   . AAA (abdominal aortic aneurysm)   . 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 CAD (coronary artery disease)     Status post CABG, stent to diagonal prior to TAVR  . Bladder cancer   . Gallstone   . Vitamin B12 deficiency   . Colon cancer   . Carotid stenosis   . Gout   . Gout     Past Surgical History  Procedure Laterality Date  . Coronary artery bypass graft    . Laminectomy    . Appendectomy    . Colectomy    . Toe surgery    . Hemicolectomy    . Mohs surgery    . Angioplasty    . Esophagogastroduodenoscopy N/A 06/28/2013    Procedure: ESOPHAGOGASTRODUODENOSCOPY (EGD);  Surgeon: Winfield Cunas., MD;  Location: [facility] ENDOSCOPY;  Service: Endoscopy;  Laterality: N/A;  . Colonoscopy N/A 06/30/2013    Procedure: COLONOSCOPY;  Surgeon: Winfield Cunas., MD;  Location: [facility] ENDOSCOPY;  Service: Endoscopy;  Laterality: N/A;    Social History:  reports that he has quit smoking.  He has never used smokeless tobacco. He reports that he drinks alcohol. He reports that he does not use illicit drugs.  Allergies:  Allergies  Allergen Reactions  . Ramipril Other (See Comments)    unknown    Medications Prior to Admission  Medication Sig Dispense Refill  . albuterol (PROVENTIL HFA;VENTOLIN HFA) 108 (90 BASE) MCG/ACT inhaler Inhale 2 puffs into the lungs every 6 (six) hours as needed for wheezing or shortness of breath.      Marland Kitchen atorvastatin (LIPITOR) 40 MG tablet Take 40 mg by mouth daily at 6 PM.       . citric acid-sodium citrate (ORACIT) 334-500 MG/5ML solution Take 15 mLs by mouth every morning. 1 Tablespoon Daily      . colchicine 0.6 MG tablet  Take 0.6 mg by mouth daily as needed (for gout).      . Cyanocobalamin (VITAMIN B-12 IJ) Inject 1 each as directed every 30 (thirty) days.      . famotidine (TH FAMOTIDINE 10) 10 MG tablet Take 10 mg by mouth every morning.      . febuxostat (ULORIC) 40 MG tablet Take 40 mg by mouth every morning.      . furosemide (LASIX) 40 MG tablet Take 40 mg by mouth every morning.      . metolazone (ZAROXOLYN) 2.5 MG tablet Take 2.5 mg by mouth daily as needed (for fluid).      . metoprolol tartrate (LOPRESSOR) 25 MG tablet Take 50 mg by mouth every morning.      . [DISCONTINUED] aspirin 81 MG tablet Take 81 mg by mouth every morning.      . [DISCONTINUED] Multiple Vitamins-Iron (MULTIVITAMINS WITH IRON) TABS tablet Take 1 tablet by mouth every evening.       . [DISCONTINUED] warfarin (COUMADIN) 5 MG tablet Take 5 mg by mouth every morning. Takes $RemoveBefore'5mg'yRlGDbMxNXyIu$  on  Monday and Friday . Takes 2.$RemoveB'5mg'EFdAADam$  all other days        Blood pressure 128/43, pulse 98, temperature 98.1 F (36.7 C), temperature source Oral, resp. rate 26, height $RemoveBe'5\' 8"'PqnJrsQAW$  (1.727 m), weight 166 lb 3.6 oz (75.4 kg), SpO2 100.00%. Physical Exam: General: pleasant, WD/WN white male who is laying in bed sedated on vent but arrousable HEENT: head is normocephalic, atraumatic.  Sclera are noninjected.  PERRL.  Ears and nose without any masses or lesions.  Mouth is pink but dry. Heart: irregular rhythm, tachycardic.  Murmur heard, no G/R.  Palpable pedal pulses bilaterally Lungs: CTAB, no wheezes, rhonchi, or rales noted.  Respiratory effort nonlabored Abd: soft, distended, NT, +BS, no masses, hernias, or organomegaly MS: all 4 extremities are symmetrical with no cyanosis, clubbing, some non-pitting edema Skin: warm and dry with no masses, lesions, or rashes Psych: sedated on vent, but arrousable   Results for orders placed during the hospital encounter of 06/26/13 (from the past 48 hour(s))  CBC     Status: Abnormal   Collection Time    07/01/13  3:00 PM       Result Value Ref Range   WBC 10.7 (*) 4.0 - 10.5 K/uL   RBC 2.39 (*) 4.22 - 5.81 MIL/uL   Hemoglobin 7.4 (*) 13.0 - 17.0 g/dL   HCT 20.8 (*) 39.0 - 52.0 %   MCV 87.0  78.0 - 100.0 fL   Comment: POST TRANSFUSION SPECIMEN   MCH 31.0  26.0 - 34.0 pg   MCHC 35.6  30.0 - 36.0 g/dL   RDW 14.9  11.5 - 15.5 %  Platelets 137 (*) 150 - 400 K/uL   Comment: DELTA CHECK NOTED     REPEATED TO VERIFY  LACTIC ACID, PLASMA     Status: None   Collection Time    07/01/13  3:00 PM      Result Value Ref Range   Lactic Acid, Venous 1.1  0.5 - 2.2 mmol/L  TROPONIN I     Status: None   Collection Time    07/01/13  3:00 PM      Result Value Ref Range   Troponin I <0.30  <0.30 ng/mL   Comment:            Due to the release kinetics of cTnI,     a negative result within the first hours     of the onset of symptoms does not rule out     myocardial infarction with certainty.     If myocardial infarction is still suspected,     repeat the test at appropriate intervals.  BASIC METABOLIC PANEL     Status: Abnormal   Collection Time    07/01/13  4:47 PM      Result Value Ref Range   Sodium 139  137 - 147 mEq/L   Potassium 3.9  3.7 - 5.3 mEq/L   Chloride 109  96 - 112 mEq/L   CO2 18 (*) 19 - 32 mEq/L   Glucose, Bld 143 (*) 70 - 99 mg/dL   BUN 63 (*) 6 - 23 mg/dL   Creatinine, Ser 2.69 (*) 0.50 - 1.35 mg/dL   Calcium 7.1 (*) 8.4 - 10.5 mg/dL   GFR calc non Af Amer 21 (*) >90 mL/min   GFR calc Af Amer 24 (*) >90 mL/min   Comment: (NOTE)     The eGFR has been calculated using the CKD EPI equation.     This calculation has not been validated in all clinical situations.     eGFR's persistently <90 mL/min signify possible Chronic Kidney     Disease.  MAGNESIUM     Status: None   Collection Time    07/01/13  4:47 PM      Result Value Ref Range   Magnesium 2.0  1.5 - 2.5 mg/dL  PHOSPHORUS     Status: None   Collection Time    07/01/13  4:47 PM      Result Value Ref Range   Phosphorus 3.9  2.3  - 4.6 mg/dL  CBC     Status: Abnormal   Collection Time    07/01/13  7:00 PM      Result Value Ref Range   WBC 12.6 (*) 4.0 - 10.5 K/uL   RBC 2.57 (*) 4.22 - 5.81 MIL/uL   Hemoglobin 8.0 (*) 13.0 - 17.0 g/dL   HCT 22.4 (*) 39.0 - 52.0 %   MCV 87.2  78.0 - 100.0 fL   MCH 31.1  26.0 - 34.0 pg   MCHC 35.7  30.0 - 36.0 g/dL   RDW 15.3  11.5 - 15.5 %   Platelets 136 (*) 150 - 400 K/uL  CBC     Status: Abnormal   Collection Time    07/01/13  7:50 PM      Result Value Ref Range   WBC 12.6 (*) 4.0 - 10.5 K/uL   RBC 2.64 (*) 4.22 - 5.81 MIL/uL   Hemoglobin 8.0 (*) 13.0 - 17.0 g/dL   HCT 22.9 (*) 39.0 - 52.0 %   MCV 86.7  78.0 -  100.0 fL   MCH 30.3  26.0 - 34.0 pg   MCHC 34.9  30.0 - 36.0 g/dL   RDW 15.4  11.5 - 15.5 %   Platelets 140 (*) 150 - 400 K/uL  TROPONIN I     Status: None   Collection Time    07/01/13  7:57 PM      Result Value Ref Range   Troponin I <0.30  <0.30 ng/mL   Comment:            Due to the release kinetics of cTnI,     a negative result within the first hours     of the onset of symptoms does not rule out     myocardial infarction with certainty.     If myocardial infarction is still suspected,     repeat the test at appropriate intervals.  CBC     Status: Abnormal   Collection Time    07/02/13  7:16 AM      Result Value Ref Range   WBC 11.2 (*) 4.0 - 10.5 K/uL   RBC 2.33 (*) 4.22 - 5.81 MIL/uL   Hemoglobin 7.1 (*) 13.0 - 17.0 g/dL   HCT 20.4 (*) 39.0 - 52.0 %   MCV 87.6  78.0 - 100.0 fL   MCH 30.5  26.0 - 34.0 pg   MCHC 34.8  30.0 - 36.0 g/dL   RDW 15.7 (*) 11.5 - 15.5 %   Platelets 123 (*) 150 - 400 K/uL  PROTIME-INR     Status: None   Collection Time    07/02/13  7:16 AM      Result Value Ref Range   Prothrombin Time 14.5  11.6 - 15.2 seconds   INR 1.15  0.00 - 4.31  BASIC METABOLIC PANEL     Status: Abnormal   Collection Time    07/02/13  7:16 AM      Result Value Ref Range   Sodium 137  137 - 147 mEq/L   Potassium 3.7  3.7 - 5.3 mEq/L    Chloride 104  96 - 112 mEq/L   CO2 18 (*) 19 - 32 mEq/L   Glucose, Bld 98  70 - 99 mg/dL   BUN 60 (*) 6 - 23 mg/dL   Creatinine, Ser 2.70 (*) 0.50 - 1.35 mg/dL   Calcium 7.5 (*) 8.4 - 10.5 mg/dL   GFR calc non Af Amer 21 (*) >90 mL/min   GFR calc Af Amer 24 (*) >90 mL/min   Comment: (NOTE)     The eGFR has been calculated using the CKD EPI equation.     This calculation has not been validated in all clinical situations.     eGFR's persistently <90 mL/min signify possible Chronic Kidney     Disease.  PHOSPHORUS     Status: None   Collection Time    07/02/13  7:16 AM      Result Value Ref Range   Phosphorus 3.7  2.3 - 4.6 mg/dL  MAGNESIUM     Status: None   Collection Time    07/02/13  7:16 AM      Result Value Ref Range   Magnesium 2.0  1.5 - 2.5 mg/dL  CBC     Status: Abnormal   Collection Time    07/02/13 12:30 PM      Result Value Ref Range   WBC 14.0 (*) 4.0 - 10.5 K/uL   RBC 2.39 (*) 4.22 - 5.81 MIL/uL   Hemoglobin  7.4 (*) 13.0 - 17.0 g/dL   HCT 34.9 (*) 75.3 - 04.5 %   MCV 87.9  78.0 - 100.0 fL   MCH 31.0  26.0 - 34.0 pg   MCHC 35.2  30.0 - 36.0 g/dL   RDW 96.6 (*) 23.2 - 54.1 %   Platelets 143 (*) 150 - 400 K/uL  PROTIME-INR     Status: None   Collection Time    07/02/13  4:00 PM      Result Value Ref Range   Prothrombin Time 14.9  11.6 - 15.2 seconds   INR 1.20  0.00 - 1.49  TROPONIN I     Status: None   Collection Time    07/02/13  6:33 PM      Result Value Ref Range   Troponin I <0.30  <0.30 ng/mL   Comment:            Due to the release kinetics of cTnI,     a negative result within the first hours     of the onset of symptoms does not rule out     myocardial infarction with certainty.     If myocardial infarction is still suspected,     repeat the test at appropriate intervals.  CBC     Status: Abnormal   Collection Time    07/02/13  7:40 PM      Result Value Ref Range   WBC 12.5 (*) 4.0 - 10.5 K/uL   RBC 2.17 (*) 4.22 - 5.81 MIL/uL   Hemoglobin  6.7 (*) 13.0 - 17.0 g/dL   Comment: REPEATED TO VERIFY     CRITICAL RESULT CALLED TO, READ BACK BY AND VERIFIED WITH:     GROCE S. RN AT 2008 ON 07/02/13 BY INGOLD L   HCT 19.0 (*) 39.0 - 52.0 %   MCV 87.6  78.0 - 100.0 fL   MCH 30.9  26.0 - 34.0 pg   MCHC 35.3  30.0 - 36.0 g/dL   RDW 73.6 (*) 13.8 - 06.4 %   Platelets 130 (*) 150 - 400 K/uL  PREPARE RBC (CROSSMATCH)     Status: None   Collection Time    07/02/13  8:20 PM      Result Value Ref Range   Order Confirmation ORDER PROCESSED BY BLOOD BANK    LACTIC ACID, PLASMA     Status: Abnormal   Collection Time    07/03/13 12:36 AM      Result Value Ref Range   Lactic Acid, Venous 3.5 (*) 0.5 - 2.2 mmol/L  TROPONIN I     Status: None   Collection Time    07/03/13 12:36 AM      Result Value Ref Range   Troponin I <0.30  <0.30 ng/mL   Comment:            Due to the release kinetics of cTnI,     a negative result within the first hours     of the onset of symptoms does not rule out     myocardial infarction with certainty.     If myocardial infarction is still suspected,     repeat the test at appropriate intervals.  PREPARE RBC (CROSSMATCH)     Status: None   Collection Time    07/03/13  1:00 AM      Result Value Ref Range   Order Confirmation ORDER PROCESSED BY BLOOD BANK    COMPREHENSIVE METABOLIC PANEL     Status:  Abnormal   Collection Time    07/03/13  1:00 AM      Result Value Ref Range   Sodium 140  137 - 147 mEq/L   Potassium 4.1  3.7 - 5.3 mEq/L   Chloride 114 (*) 96 - 112 mEq/L   CO2 13 (*) 19 - 32 mEq/L   Glucose, Bld 170 (*) 70 - 99 mg/dL   BUN 56 (*) 6 - 23 mg/dL   Creatinine, Ser 2.29 (*) 0.50 - 1.35 mg/dL   Calcium 6.1 (*) 8.4 - 10.5 mg/dL   Comment: CRITICAL RESULT CALLED TO, READ BACK BY AND VERIFIED WITH:     A. STOPHEL (RN) [id number] 07/03/2013 L. LOMAX   Total Protein 2.9 (*) 6.0 - 8.3 g/dL   Albumin 1.2 (*) 3.5 - 5.2 g/dL   AST 13  0 - 37 U/L   ALT 9  0 - 53 U/L   Alkaline Phosphatase 42  39 - 117 U/L    Total Bilirubin 1.1  0.3 - 1.2 mg/dL   GFR calc non Af Amer 25 (*) >90 mL/min   GFR calc Af Amer 29 (*) >90 mL/min   Comment: (NOTE)     The eGFR has been calculated using the CKD EPI equation.     This calculation has not been validated in all clinical situations.     eGFR's persistently <90 mL/min signify possible Chronic Kidney     Disease.  POCT I-STAT 3, BLOOD GAS (G3+)     Status: Abnormal   Collection Time    07/03/13  4:59 AM      Result Value Ref Range   pH, Arterial 7.223 (*) 7.350 - 7.450   pCO2 arterial 35.7  35.0 - 45.0 mmHg   pO2, Arterial 425.0 (*) 80.0 - 100.0 mmHg   Bicarbonate 14.8 (*) 20.0 - 24.0 mEq/L   TCO2 16  0 - 100 mmol/L   O2 Saturation 100.0     Acid-base deficit 12.0 (*) 0.0 - 2.0 mmol/L   Patient temperature 98.2 F     Collection site ARTERIAL LINE     Drawn by RT     Sample type ARTERIAL    BASIC METABOLIC PANEL     Status: Abnormal   Collection Time    07/03/13  5:00 AM      Result Value Ref Range   Sodium 137  137 - 147 mEq/L   Potassium 3.9  3.7 - 5.3 mEq/L   Chloride 111  96 - 112 mEq/L   CO2 12 (*) 19 - 32 mEq/L   Glucose, Bld 188 (*) 70 - 99 mg/dL   BUN 56 (*) 6 - 23 mg/dL   Creatinine, Ser 2.43 (*) 0.50 - 1.35 mg/dL   Calcium 6.5 (*) 8.4 - 10.5 mg/dL   GFR calc non Af Amer 24 (*) >90 mL/min   GFR calc Af Amer 27 (*) >90 mL/min   Comment: (NOTE)     The eGFR has been calculated using the CKD EPI equation.     This calculation has not been validated in all clinical situations.     eGFR's persistently <90 mL/min signify possible Chronic Kidney     Disease.  PHOSPHORUS     Status: None   Collection Time    07/03/13  5:00 AM      Result Value Ref Range   Phosphorus 4.2  2.3 - 4.6 mg/dL  MAGNESIUM     Status: None   Collection Time  07/03/13  5:00 AM      Result Value Ref Range   Magnesium 1.8  1.5 - 2.5 mg/dL  PRO B NATRIURETIC PEPTIDE     Status: Abnormal   Collection Time    07/03/13  5:00 AM      Result Value Ref Range    Pro B Natriuretic peptide (BNP) 7905.0 (*) 0 - 450 pg/mL  TROPONIN I     Status: None   Collection Time    07/03/13  5:20 AM      Result Value Ref Range   Troponin I <0.30  <0.30 ng/mL   Comment:            Due to the release kinetics of cTnI,     a negative result within the first hours     of the onset of symptoms does not rule out     myocardial infarction with certainty.     If myocardial infarction is still suspected,     repeat the test at appropriate intervals.  LACTIC ACID, PLASMA     Status: None   Collection Time    07/03/13  5:55 AM      Result Value Ref Range   Lactic Acid, Venous 1.6  0.5 - 2.2 mmol/L  POCT I-STAT 3, BLOOD GAS (G3+)     Status: Abnormal   Collection Time    07/03/13  5:56 AM      Result Value Ref Range   pH, Arterial 7.254 (*) 7.350 - 7.450   pCO2 arterial 28.6 (*) 35.0 - 45.0 mmHg   pO2, Arterial 170.0 (*) 80.0 - 100.0 mmHg   Bicarbonate 12.6 (*) 20.0 - 24.0 mEq/L   TCO2 13  0 - 100 mmol/L   O2 Saturation 99.0     Acid-base deficit 13.0 (*) 0.0 - 2.0 mmol/L   Patient temperature 98.6 F     Collection site ARTERIAL LINE     Drawn by RT     Sample type ARTERIAL    CBC     Status: Abnormal   Collection Time    07/03/13  7:50 AM      Result Value Ref Range   WBC 19.2 (*) 4.0 - 10.5 K/uL   RBC 2.98 (*) 4.22 - 5.81 MIL/uL   Hemoglobin 9.2 (*) 13.0 - 17.0 g/dL   Comment: RESULT REPEATED AND VERIFIED     POST TRANSFUSION SPECIMEN   HCT 26.0 (*) 39.0 - 52.0 %   MCV 87.2  78.0 - 100.0 fL   MCH 30.9  26.0 - 34.0 pg   MCHC 35.4  30.0 - 36.0 g/dL   RDW 14.5  11.5 - 15.5 %   Platelets 95 (*) 150 - 400 K/uL   Comment: PLATELET COUNT CONFIRMED BY SMEAR  FIBRINOGEN     Status: Abnormal   Collection Time    07/03/13  8:08 AM      Result Value Ref Range   Fibrinogen 197 (*) 204 - 475 mg/dL  CBC     Status: Abnormal   Collection Time    07/03/13  8:08 AM      Result Value Ref Range   WBC 17.4 (*) 4.0 - 10.5 K/uL   RBC 2.81 (*) 4.22 - 5.81 MIL/uL    Hemoglobin 8.6 (*) 13.0 - 17.0 g/dL   HCT 24.3 (*) 39.0 - 52.0 %   MCV 86.5  78.0 - 100.0 fL   MCH 30.6  26.0 - 34.0 pg   MCHC 35.4  30.0 - 36.0 g/dL   RDW 14.6  11.5 - 15.5 %   Platelets 77 (*) 150 - 400 K/uL   Comment: CONSISTENT WITH PREVIOUS RESULT  APTT     Status: None   Collection Time    07/03/13  8:30 AM      Result Value Ref Range   aPTT 33  24 - 37 seconds  PROTIME-INR     Status: Abnormal   Collection Time    07/03/13  8:30 AM      Result Value Ref Range   Prothrombin Time 17.2 (*) 11.6 - 15.2 seconds   INR 1.44  0.00 - 1.49  POCT I-STAT 3, BLOOD GAS (G3+)     Status: Abnormal   Collection Time    07/03/13  9:13 AM      Result Value Ref Range   pH, Arterial 7.293 (*) 7.350 - 7.450   pCO2 arterial 29.8 (*) 35.0 - 45.0 mmHg   pO2, Arterial 143.0 (*) 80.0 - 100.0 mmHg   Bicarbonate 14.5 (*) 20.0 - 24.0 mEq/L   TCO2 15  0 - 100 mmol/L   O2 Saturation 99.0     Acid-base deficit 11.0 (*) 0.0 - 2.0 mmol/L   Patient temperature 98.1 F     Collection site ARTERIAL LINE     Drawn by RT     Sample type ARTERIAL    URINALYSIS, ROUTINE W REFLEX MICROSCOPIC     Status: Abnormal   Collection Time    07/03/13  9:32 AM      Result Value Ref Range   Color, Urine YELLOW  YELLOW   APPearance CLOUDY (*) CLEAR   Specific Gravity, Urine 1.021  1.005 - 1.030   pH 6.0  5.0 - 8.0   Glucose, UA NEGATIVE  NEGATIVE mg/dL   Hgb urine dipstick LARGE (*) NEGATIVE   Bilirubin Urine NEGATIVE  NEGATIVE   Ketones, ur NEGATIVE  NEGATIVE mg/dL   Protein, ur 30 (*) NEGATIVE mg/dL   Urobilinogen, UA 0.2  0.0 - 1.0 mg/dL   Nitrite NEGATIVE  NEGATIVE   Leukocytes, UA MODERATE (*) NEGATIVE  URINE MICROSCOPIC-ADD ON     Status: Abnormal   Collection Time    07/03/13  9:32 AM      Result Value Ref Range   Squamous Epithelial / LPF FEW (*) RARE   WBC, UA TOO NUMEROUS TO COUNT  <3 WBC/hpf   RBC / HPF 11-20  <3 RBC/hpf   Bacteria, UA MANY (*) RARE   Nm Gi Blood Loss  07/01/2013   CLINICAL  DATA:  GI bleed  EXAM: NUCLEAR MEDICINE GASTROINTESTINAL BLEEDING SCAN  TECHNIQUE: Sequential abdominal images were obtained following intravenous administration of Tc-90m labeled red blood cells.  RADIOPHARMACEUTICALS:  25MILLI CURIE ULTRATAG TECHNETIUM TC 68M-LABELED RED BLOOD CELLS IV KITmCi Tc-56m in-vitro labeled red cells.  COMPARISON:  None.  FINDINGS: Imaging was performed over 120 min.  Expected uptake is seen in the aortoiliac vasculature, the liver, urinary bladder, and heart. No changing/mobile radiotracer activity is seen in the expected location of the bowel during this two jour study to suggest an active bleed.  IMPRESSION: Negative.  No active gastrointestinal bleed identified.   Electronically Signed   By: Curlene Dolphin M.D.   On: 07/01/2013 15:47   US Aorta Port  07/03/2013   CLINICAL DATA:  Aortic aneurysm, hypertension  EXAM: ULTRASOUND OF ABDOMINAL AORTA  TECHNIQUE: Ultrasound examination of the abdominal aorta was performed to evaluate for abdominal aortic aneurysm.  COMPARISON:  Arteriogram from earlier the same day, and prior studies  FINDINGS: Abdominal Aorta  There is a fusiform distal abdominal aortic aneurysm.  Maximum AP  Diameter:  3.5 cm  Maximum TRV  Diameter: 3.9 cm (previously 3.5 cm on 06/16/2012 by my measurement)  Common iliac arteries are dilated, right 28 mm diameter, left 24 mm.  IMPRESSION: 1. Slight enlargement of 3.9 cm fusiform abdominal aortic aneurysm and aneurysmal common iliac arteries as above. Recommend follow up by [place] in 2years. This recommendation follows ACR consensus guidelines: White Paper of the ACR Incidental Findings Committee II on Vascular Findings. Joellyn Rued [id number]; 97:[id number].   Electronically Signed   By: Arne Cleveland M.D.   On: 07/03/2013 10:46   Dg Chest Port 1 View  07/03/2013   CLINICAL DATA:  Intubation.  EXAM: PORTABLE CHEST - 1 VIEW  COMPARISON:  DG CHEST 1V PORT dated 07/01/2013  FINDINGS: Endotracheal tube noted with its tip  approximately 6 cm above the carina. Left IJ tube noted with tip projected over superior vena cava. Cardiomegaly, normal pulmonary vascularity. Bilateral basilar interstitial lung infiltrates with Kerley B-lines are noted. Left-sided pleural effusion. These findings are consistent with congestive heart failure with pulmonary edema. Prior CABG. No pneumothorax or acute bony abnormality.  IMPRESSION: 1. Endotracheal tube and left IJ line in good anatomic position. 2. Findings suggesting congestive heart failure with interstitial edema and small left pleural effusion.   Electronically Signed   By: Marcello Moores  Register   On: 07/03/2013 01:56   Dg Chest Port 1 View  07/01/2013   CLINICAL DATA:  Central line placement  EXAM: PORTABLE CHEST - 1 VIEW  COMPARISON:  Prior chest x-ray 06/27/2013  FINDINGS: Newly placed left IJ central venous catheter. Catheter tip projects over the distal SVC. No evidence of pneumothorax. Trace left pleural effusion appears improved compared to prior imaging. There is residual left basilar atelectasis. The right lung is relatively clear. Background bronchitic changes and interstitial prominence are improved compared to prior. Stable cardiomegaly. Patient is status post median sternotomy with evidence of prior multivessel CABG. Aortic atherosclerosis again noted. No acute osseous abnormality.  IMPRESSION: 1. The tip of a left IJ approach central venous catheter projects over the distal SVC. No evidence of pneumothorax or other acute complication. 2. Slightly improved pulmonary interstitial edema and decreasing left pleural effusion with residual left basilar atelectasis compared to 06/27/2013.   Electronically Signed   By: Jacqulynn Cadet M.D.   On: 07/01/2013 12:44      Assessment/Plan Lower GI bleeding Rectal bleeding ABL Anemia Acute respiratory failure Hypotension requiring pressors S/p CSP on 06/30/13 with polypectomy AFIB - with bioprosthetic aortic valve normally on  coumadin  1.  CCM 2.  NPO, IVF, pain control, antiemetics, PPI 3.  SCD's, holding coumadin, INR now subtherapeutic 4.  We have not have a source of the bleeding.  Pending colonoscopy which can hopefully be diagnostic and therapeutic 5.  Hopefully he will not need surgical intervention 6.  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  will discuss with Dr. Hilbert Odor, [place], [facility] Surgery 07/03/2013, 11:45 AM Pager: [phone]

## 2013-07-03 NOTE — Progress Notes (Addendum)
Called to see patient for worsening shock in the setting of 11 maroon colored bowel movements. On exam somnolent, tachycardic in afib, cool extremities. Large maroon colored bowel movement present. CVP 0, BP 130/30 on 50 mcg neo.   Discussed case with Drs. Gannum of GI and of VIR. Plan for angiogram with possible intervention tonight.   Will place A line and give 2 more U PRBCs. Blood bank will keep 4 units ahead of patient. Ideally, will check CBC after the additional 2 Units I have ordered are administered. Given patient's diminished mental status will intubate prior to procedure.   CRITICAL CARE: The patient is critically ill with multiple organ systems failure and requires high complexity decision making for assessment and support, frequent evaluation and titration of therapies, application of advanced monitoring technologies and extensive interpretation of multiple databases. Critical Care Time devoted to patient care services described in this note is 60 minutes.

## 2013-07-03 NOTE — Progress Notes (Signed)
Addendum: Patient ultrasound showed a 3.8 cm abdominal aortic aneurysm. He did not pass any of the MiraLAX given by NG tube. Colonoscopy revealed blood and clots throughout the colon with difficult visualization throughout and multiple diverticula especially distally. I could identify the cecum by the appendiceal orifice but could not intubate the terminal ileum and a lot of areas were obscured by blood and blood clots.. It was unclear if there was bleeding coming from the terminal ileum. There were old blood and clots throughout the colon which appeared to be more prominent in the distal half. The previous ascending colon polypectomy site could not be located.  Subsequent to that an EGD was performed and a Givens capsule endoscope was deployed in the distal antrum of the stomach. I could not get the deploying device through the pylorus. After this was removed, the scope was reintroduced into the duodenum with some difficulty and there was no fresh or old blood in the duodenum stomach or esophagus.  Will continue to hold anticoagulation, transfuse as necessary and await results of capsule endoscopy.

## 2013-07-03 NOTE — Progress Notes (Signed)
Chaplain responded to page concerning gravely ill pt. Pt's wife was present and stated that they would like a priest. Bonney Roussel called priest from [place]. Pt's wife then stated that pt was about to go into procedure and that the priest would have to come later. Chaplain called back priest and explained. Idelle Crouch stated he would be here later in the morning.

## 2013-07-03 NOTE — Consult Note (Addendum)
PULMONARY  / CRITICAL CARE MEDICINE  Name: [name] MRN: [id number] DOB: 05-03-1930 PCP Horton Finer, MD    ADMISSION DATE:  06/26/2013  LOS 7 days   CONSULTATION DATE:  07/01/13  REFERRING MD :  Dr Delfina Redwood  PRIMARY SERVICE: [facility]ie [facility]er -> PCCM  CHIEF COMPLAINT:  LGI bleed with hemodynamic instability  BRIEF PATIENT DESCRIPTION: 80 year old male with hx of polyps but refused screening scopies. Also A Fib and bioprosthetic aortic coumadin.s/p neo bladder early 200s Admitted 06/26/2013 wih 10 day shx of melena in setting of high INR 4. Underwent upper and lower endoscopies as above without sign of bleed. Also s/p polypectomnt Then on 07/01/13: had futher bleed but BRPR this time with bp/hr instability.  SIGNIFICANT EVENTS / STUDIES:  06/26/2013 - admit 3./11/15 - ugi scope - normal 06/30/13 - Colonsocpsy - ascending colon polypectomy. Scattered diverticulosis. Hemorrhoids. No active bleed 07/01/13 - Move tO ICU. Nuclear medicine scan negative for bleed. Foley inserted by Dr Risa Grill 3/15 angio neg, ett 3/16- remains on pressors, further bloody stools noted, 4 units prbc   LINES / TUBES: CVL 07/01/13 - Foley (Dr Risa Grill) 07/01/13 3/16 ett>>>  CULTURES: none  ANTIBIOTICS: Anti-infectives   Start     Dose/Rate Route Frequency Ordered Stop   07/03/13 0204  ceFAZolin (ANCEF) 2-3 GM-% IVPB SOLR    Comments:  Thelma Barge   : cabinet override      07/03/13 0204 07/03/13 0313    \   SUBJECTIVE:   3/16- angio done , neg, remains on 10 levophed  VITAL SIGNS: Filed Vitals:   07/03/13 0614 07/03/13 0700 07/03/13 0715 07/03/13 0730  BP: 113/40 133/66  117/46  Pulse:  43 32 42  Temp:      TempSrc:      Resp:  26 26 26   Height:      Weight:      SpO2:  100% 100% 100%         VENTILATOR SETTINGS:  INTAKE / OUTPUT: I/O last 3 completed shifts: In: 2654.8 [I.V.:2241; Blood:350; IV Piggyback:63.8] Out: 2165 [Urine:2150; Stool:15]     PHYSICAL  EXAMINATION: General: sedarted Neuro: rass -2, not following commands HEENT:  Neck supple, ett wnl Cardiovascular:  S1S2 +. No murmurs Lungs:  Coarse Abdomen:  surgial scar +. Soft, non tender, normo active bowel sounds Musculoskeletal: mild edema Skin:  intact  LABS: PULMONARY  Recent Labs Lab 07/03/13 0459 07/03/13 0556  PHART 7.223* 7.254*  PCO2ART 35.7 28.6*  PO2ART 425.0* 170.0*  HCO3 14.8* 12.6*  TCO2 16 13  O2SAT 100.0 99.0    CBC  Recent Labs Lab 07/02/13 0716 07/02/13 1230 07/02/13 1940  HGB 7.1* 7.4* 6.7*  HCT 20.4* 21.0* 19.0*  WBC 11.2* 14.0* 12.5*  PLT 123* 143* 130*    COAGULATION  Recent Labs Lab 06/29/13 0414 06/30/13 0545 07/01/13 0523 07/02/13 0716 07/02/13 1600  INR 2.12* 1.96* 2.21* 1.15 1.20    CARDIAC    Recent Labs Lab 07/01/13 1500 07/01/13 1957 07/02/13 1833 07/03/13 0036 07/03/13 0520  TROPONINI <0.30 <0.30 <0.30 <0.30 <0.30    Recent Labs Lab 07/03/13 0500  PROBNP 7905.0*     CHEMISTRY  Recent Labs Lab 06/26/13 1939 06/27/13 0305  07/01/13 0523 07/01/13 1647 07/02/13 0716 07/03/13 0100 07/03/13 0500  NA 136* 139  < > 138 139 137 140 137  K 3.9 4.1  < > 4.0 3.9 3.7 4.1 3.9  CL 99 102  < > 104 109 104 114* 111  CO2 21 20  < > 15* 18* 18* 13* 12*  GLUCOSE 165* 113*  < > 165* 143* 98 170* 188*  BUN 91* 89*  < > 61* 63* 60* 56* 56*  CREATININE 2.78* 2.66*  < > 2.55* 2.69* 2.70* 2.29* 2.43*  CALCIUM 8.6 8.4  < > 7.8* 7.1* 7.5* 6.1* 6.5*  MG  --  2.3  --   --  2.0 2.0  --  1.8  PHOS  --  4.6  --   --  3.9 3.7  --  4.2  < > = values in this interval not displayed. Estimated Creatinine Clearance: 23.5 ml/min (by C-G formula based on Cr of 2.43).   LIVER  Recent Labs Lab 06/26/13 1939 06/27/13 0305  06/29/13 0414 06/30/13 0545 07/01/13 0523 07/02/13 0716 07/02/13 1600 07/03/13 0100  AST 20 17  --   --   --   --   --   --  13  ALT 16 13  --   --   --   --   --   --  9  ALKPHOS 99 96  --   --    --   --   --   --  42  BILITOT 0.5 0.6  --   --   --   --   --   --  1.1  PROT 6.5 6.0  --   --   --   --   --   --  2.9*  ALBUMIN 3.0* 2.9*  --   --   --   --   --   --  1.2*  INR 4.17* 4.05*  < > 2.12* 1.96* 2.21* 1.15 1.20  --   < > = values in this interval not displayed.   INFECTIOUS  Recent Labs Lab 07/01/13 1500 07/03/13 0036 07/03/13 0555  LATICACIDVEN 1.1 3.5* 1.6     ENDOCRINE CBG (last 3)   Recent Labs  07/01/13 1031  GLUCAP 119*      IMAGING x48h  Nm Gi Blood Loss  07/01/2013   CLINICAL DATA:  GI bleed  EXAM: NUCLEAR MEDICINE GASTROINTESTINAL BLEEDING SCAN  TECHNIQUE: Sequential abdominal images were obtained following intravenous administration of Tc-29m labeled red blood cells.  RADIOPHARMACEUTICALS:  25MILLI CURIE ULTRATAG TECHNETIUM TC 36M-LABELED RED BLOOD CELLS IV KITmCi Tc-52m in-vitro labeled red cells.  COMPARISON:  None.  FINDINGS: Imaging was performed over 120 min.  Expected uptake is seen in the aortoiliac vasculature, the liver, urinary bladder, and heart. No changing/mobile radiotracer activity is seen in the expected location of the bowel during this two jour study to suggest an active bleed.  IMPRESSION: Negative.  No active gastrointestinal bleed identified.   Electronically Signed   By: Curlene Dolphin M.D.   On: 07/01/2013 15:47   Dg Chest Port 1 View  07/03/2013   CLINICAL DATA:  Intubation.  EXAM: PORTABLE CHEST - 1 VIEW  COMPARISON:  DG CHEST 1V PORT dated 07/01/2013  FINDINGS: Endotracheal tube noted with its tip approximately 6 cm above the carina. Left IJ tube noted with tip projected over superior vena cava. Cardiomegaly, normal pulmonary vascularity. Bilateral basilar interstitial lung infiltrates with Kerley B-lines are noted. Left-sided pleural effusion. These findings are consistent with congestive heart failure with pulmonary edema. Prior CABG. No pneumothorax or acute bony abnormality.  IMPRESSION: 1. Endotracheal tube and left IJ line in  good anatomic position. 2. Findings suggesting congestive heart failure with interstitial edema and small left pleural effusion.  Electronically Signed   By: Marcello Moores  Register   On: 07/03/2013 01:56   Dg Chest Port 1 View  07/01/2013   CLINICAL DATA:  Central line placement  EXAM: PORTABLE CHEST - 1 VIEW  COMPARISON:  Prior chest x-ray 06/27/2013  FINDINGS: Newly placed left IJ central venous catheter. Catheter tip projects over the distal SVC. No evidence of pneumothorax. Trace left pleural effusion appears improved compared to prior imaging. There is residual left basilar atelectasis. The right lung is relatively clear. Background bronchitic changes and interstitial prominence are improved compared to prior. Stable cardiomegaly. Patient is status post median sternotomy with evidence of prior multivessel CABG. Aortic atherosclerosis again noted. No acute osseous abnormality.  IMPRESSION: 1. The tip of a left IJ approach central venous catheter projects over the distal SVC. No evidence of pneumothorax or other acute complication. 2. Slightly improved pulmonary interstitial edema and decreasing left pleural effusion with residual left basilar atelectasis compared to 06/27/2013.   Electronically Signed   By: Jacqulynn Cadet M.D.   On: 07/01/2013 12:44    ASSESSMENT / PLAN:  PULMONARY A: Acute resp failure, increased effusion atx rt? P:   Keep same peep, O2 needs Repeat a line with raised MV Keep 8 cc/kg TV Repeat pcxr for bases rt Allow pos balance  CARDIOVASCULAR A:  Baseline A Fib and bioprosthetic aortic valve Circulatory shock due to hemorrhage P:  Neo to off, use levophed as main pressor cvp assessment, goal 10-12, repeat this , error? 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 Source. UGI and LGI and nuclear medicine scan without source, s/p polypectomy this admit, neg angio  P:   [facility] Dr Paulita Fujita following NPO, consider CT if fistula a concern with AAA, will call [facility] for consult, will order [place] abdo for now, has renal failurwe as far as contrast NPO for now Cbc frequent ppi lft in am  Lactic acid re assuring  HEMATOLOGIC A:  Anemia of bleeding. Resolved coagulapathy from coumadin, at risk consumption coags P:  Repeat coags Cbc to q4h See gi Fibrinogen Avoid factor 7 if able  INFECTIOUS A: Continued shock, at risk transient bacteremia P:   Add empiric vanc, ceftaz BC UA  ENDOCRINE A:  R/o rel AI P:   Monitor cortisol now then stress steroids  NEUROLOGIC A:  Vent dyschronny P:   fent if needed Dc versed  TODAY'S SUMMARY:  Dc neo, call ccs, cbc q4h, repeat abg  Ccm time 30 min   Lavon Paganini. Titus Mould, MD, [place] Pgr: [facility]

## 2013-07-03 NOTE — Progress Notes (Signed)
[facility] Progress Note Patient Name: [name] DOB: 1930/11/04 MRN: [id number]  Date of Service  07/03/2013   HPI/Events of Note   ABG results reviewed   eICU Interventions   Vent f increased 26 Bicarbonate gtt started ABG 1 hour    Intervention Category Intermediate Interventions: Other:  [name] 07/03/2013, 5:04 AM

## 2013-07-04 ENCOUNTER — Encounter (HOSPITAL_COMMUNITY): Payer: Medicare Other

## 2013-07-04 ENCOUNTER — Encounter (HOSPITAL_COMMUNITY): Payer: Self-pay | Admitting: Gastroenterology

## 2013-07-04 ENCOUNTER — Inpatient Hospital Stay (HOSPITAL_COMMUNITY): Payer: Medicare Other

## 2013-07-04 DIAGNOSIS — I714 Abdominal aortic aneurysm, without rupture, unspecified: Secondary | ICD-10-CM

## 2013-07-04 LAB — GLUCOSE, CAPILLARY
GLUCOSE-CAPILLARY: 157 mg/dL — AB (ref 70–99)
GLUCOSE-CAPILLARY: 152 mg/dL — AB (ref 70–99)
GLUCOSE-CAPILLARY: 139 mg/dL — AB (ref 70–99)
Glucose-Capillary: 132 mg/dL — ABNORMAL HIGH (ref 70–99)
Glucose-Capillary: 120 mg/dL — ABNORMAL HIGH (ref 70–99)

## 2013-07-04 LAB — CBC
HCT: 21.9 % — ABNORMAL LOW (ref 39.0–52.0)
HCT: 21.9 % — ABNORMAL LOW (ref 39.0–52.0)
HCT: 22.2 % — ABNORMAL LOW (ref 39.0–52.0)
HCT: 24 % — ABNORMAL LOW (ref 39.0–52.0)
HEMOGLOBIN: 7.8 g/dL — AB (ref 13.0–17.0)
HEMOGLOBIN: 8.5 g/dL — AB (ref 13.0–17.0)
HEMOGLOBIN: 7.9 g/dL — AB (ref 13.0–17.0)
Hemoglobin: 7.7 g/dL — ABNORMAL LOW (ref 13.0–17.0)
MCH: 30.2 pg (ref 26.0–34.0)
MCH: 30.3 pg (ref 26.0–34.0)
MCH: 30.8 pg (ref 26.0–34.0)
MCH: 30.5 pg (ref 26.0–34.0)
MCHC: 35.2 g/dL (ref 30.0–36.0)
MCHC: 35.6 g/dL (ref 30.0–36.0)
MCHC: 35.6 g/dL (ref 30.0–36.0)
MCHC: 35.4 g/dL (ref 30.0–36.0)
MCV: 85.4 fL (ref 78.0–100.0)
MCV: 85.7 fL (ref 78.0–100.0)
MCV: 86.2 fL (ref 78.0–100.0)
MCV: 86.6 fL (ref 78.0–100.0)
PLATELETS: 83 10*3/uL — AB (ref 150–400)
Platelets: 82 10*3/uL — ABNORMAL LOW (ref 150–400)
Platelets: 84 10*3/uL — ABNORMAL LOW (ref 150–400)
Platelets: 89 10*3/uL — ABNORMAL LOW (ref 150–400)
RBC: 2.53 MIL/uL — ABNORMAL LOW (ref 4.22–5.81)
RBC: 2.54 MIL/uL — AB (ref 4.22–5.81)
RBC: 2.59 MIL/uL — AB (ref 4.22–5.81)
RBC: 2.81 MIL/uL — ABNORMAL LOW (ref 4.22–5.81)
RDW: 15.3 % (ref 11.5–15.5)
RDW: 15.5 % (ref 11.5–15.5)
RDW: 15.6 % — ABNORMAL HIGH (ref 11.5–15.5)
RDW: 16.1 % — ABNORMAL HIGH (ref 11.5–15.5)
WBC: 18.1 10*3/uL — ABNORMAL HIGH (ref 4.0–10.5)
WBC: 18.2 10*3/uL — AB (ref 4.0–10.5)
WBC: 18.8 10*3/uL — ABNORMAL HIGH (ref 4.0–10.5)
WBC: 22.3 10*3/uL — ABNORMAL HIGH (ref 4.0–10.5)

## 2013-07-04 LAB — POCT I-STAT 3, ART BLOOD GAS (G3+)
Acid-base deficit: 9 mmol/L — ABNORMAL HIGH (ref 0.0–2.0)
Acid-base deficit: 8 mmol/L — ABNORMAL HIGH (ref 0.0–2.0)
Bicarbonate: 16 mEq/L — ABNORMAL LOW (ref 20.0–24.0)
Bicarbonate: 17.2 mEq/L — ABNORMAL LOW (ref 20.0–24.0)
O2 SAT: 99 %
O2 Saturation: 99 %
PCO2 ART: 28.9 mmHg — AB (ref 35.0–45.0)
PH ART: 7.348 — AB (ref 7.350–7.450)
PO2 ART: 144 mmHg — AB (ref 80.0–100.0)
PO2 ART: 158 mmHg — AB (ref 80.0–100.0)
Patient temperature: 98
Patient temperature: 97.7
TCO2: 17 mmol/L (ref 0–100)
TCO2: 18 mmol/L (ref 0–100)
pCO2 arterial: 31.1 mmHg — ABNORMAL LOW (ref 35.0–45.0)
pH, Arterial: 7.347 — ABNORMAL LOW (ref 7.350–7.450)

## 2013-07-04 LAB — COMPREHENSIVE METABOLIC PANEL
ALK PHOS: 60 U/L (ref 39–117)
ALT: 8 U/L (ref 0–53)
AST: 15 U/L (ref 0–37)
Albumin: 1.4 g/dL — ABNORMAL LOW (ref 3.5–5.2)
BILIRUBIN TOTAL: 0.4 mg/dL (ref 0.3–1.2)
BUN: 51 mg/dL — AB (ref 6–23)
CHLORIDE: 108 meq/L (ref 96–112)
CO2: 16 meq/L — AB (ref 19–32)
Calcium: 6.7 mg/dL — ABNORMAL LOW (ref 8.4–10.5)
Creatinine, Ser: 2.63 mg/dL — ABNORMAL HIGH (ref 0.50–1.35)
GFR calc Af Amer: 25 mL/min — ABNORMAL LOW (ref >90)
GFR calc non Af Amer: 21 mL/min — ABNORMAL LOW (ref >90)
Glucose, Bld: 161 mg/dL — ABNORMAL HIGH (ref 70–99)
POTASSIUM: 3.2 meq/L — AB (ref 3.7–5.3)
Sodium: 137 mEq/L (ref 137–147)
Total Protein: 3.3 g/dL — ABNORMAL LOW (ref 6.0–8.3)

## 2013-07-04 LAB — PHOSPHORUS: Phosphorus: 3.3 mg/dL (ref 2.3–4.6)

## 2013-07-04 LAB — CORTISOL: Cortisol, Plasma: 62.4 ug/dL

## 2013-07-04 LAB — MAGNESIUM: Magnesium: 1.8 mg/dL (ref 1.5–2.5)

## 2013-07-04 MED ORDER — PEG 3350-KCL-NA BICARB-NACL 420 G PO SOLR
2000.0000 mL | Freq: Once | ORAL | Status: AC
  Administered 2013-07-04: 2000 mL
  Filled 2013-07-04: qty 4000

## 2013-07-04 MED ORDER — INSULIN ASPART 100 UNIT/ML ~~LOC~~ SOLN
0.0000 [IU] | Freq: Three times a day (TID) | SUBCUTANEOUS | Status: DC
  Administered 2013-07-06 – 2013-07-07 (×2): 2 [IU] via SUBCUTANEOUS

## 2013-07-04 MED ORDER — METOCLOPRAMIDE HCL 5 MG/ML IJ SOLN
10.0000 mg | Freq: Once | INTRAMUSCULAR | Status: AC
  Administered 2013-07-04: 10 mg via INTRAVENOUS
  Filled 2013-07-04: qty 2

## 2013-07-04 MED ORDER — POTASSIUM CHLORIDE 10 MEQ/50ML IV SOLN
10.0000 meq | INTRAVENOUS | Status: AC
  Administered 2013-07-04 (×6): 10 meq via INTRAVENOUS
  Filled 2013-07-04 (×6): qty 50

## 2013-07-04 MED ORDER — INSULIN ASPART 100 UNIT/ML ~~LOC~~ SOLN
0.0000 [IU] | SUBCUTANEOUS | Status: DC
  Administered 2013-07-04: 3 [IU] via SUBCUTANEOUS
  Administered 2013-07-04: 5 [IU] via SUBCUTANEOUS
  Administered 2013-07-04: 2 [IU] via SUBCUTANEOUS
  Administered 2013-07-04: 3 [IU] via SUBCUTANEOUS
  Administered 2013-07-04: 2 [IU] via SUBCUTANEOUS

## 2013-07-04 NOTE — Progress Notes (Signed)
[facility] Progress Note Patient Name: [name] DOB: [birth year] MRN: [id number]  Date of Service  07/04/2013   HPI/Events of Note  Hypokalemia   eICU Interventions  Potassium replaced   Intervention Category Intermediate Interventions: Electrolyte abnormality - evaluation and management  DETERDING,ELIZABETH 07/04/2013, 5:42 AM

## 2013-07-04 NOTE — Progress Notes (Signed)
Had some bloody bowel movements through the night, but nowhere near the amount he had had the night before. Hemoglobin, following 4 units of packed cells, has drifted down slightly, but seems to be leveling off around 8. Still in acute renal failure, so BUN is difficult to interpret.  On exam, the patient is alert, while awaiting possible extubation later today. Blood pressure is much better, but he is more tachycardic, heart rate around 120.  Abdomen has active bowel sounds.  The capsule endoscopy results from yesterday should be available by later today.  Discussed with Dr. Titus Mould.  In the meantime, I will administer further Nulytely to clarify whether he is having ongoing bleeding, by hopefully purging the blood out of his GI tract, and thereby facilitate repeat colonoscopy if that should be needed.  Cleotis Nipper, M.D. [phone]

## 2013-07-04 NOTE — Progress Notes (Signed)
1 Day Post-Op  Subjective: On vent but awake.  Colonoscopy showed blood without specific bleeding site.  EGD unremarkable.  Capsule deployed.   Objective: Vital signs in last 24 hours: Temp:  [97 F (36.1 C)-98.3 F (36.8 C)] 97.7 F (36.5 C) (03/17 0840) Pulse Rate:  [46-159] 104 (03/17 0820) Resp:  [10-29] 10 (03/17 0820) BP: (67-141)/(20-73) 116/51 mmHg (03/17 0401) SpO2:  [94 %-100 %] 100 % (03/17 0820) Arterial Line BP: (99-165)/(38-101) 106/47 mmHg (03/17 0600) FiO2 (%):  [40 %] 40 % (03/17 0820) Last BM Date: 07/03/13  Intake/Output from previous day: 03/16 0701 - 03/17 0700 In: 5101.2 [I.V.:2783.7; Blood:267.5; NG/GT:1700; IV Piggyback:350] Out: 9563 [Urine:1255] Intake/Output this shift:    GI: soft non tender slight distension pos BS  multiple bloody BM.   Lab Results:   Recent Labs  07/04/13 0008 07/04/13 0408  WBC 22.3* 18.8*  HGB 8.5* 7.7*  HCT 24.0* 21.9*  PLT 89* 84*   BMET  Recent Labs  07/03/13 0500 07/04/13 0408  NA 137 137  K 3.9 3.2*  CL 111 108  CO2 12* 16*  GLUCOSE 188* 161*  BUN 56* 51*  CREATININE 2.43* 2.63*  CALCIUM 6.5* 6.7*   PT/INR  Recent Labs  07/02/13 1600 07/03/13 0830  LABPROT 14.9 17.2*  INR 1.20 1.44   ABG  Recent Labs  07/03/13 0913 07/04/13 0409  PHART 7.293* 7.348*  HCO3 14.5* 16.0*    Studies/Results: US Aorta Port  07/03/2013   CLINICAL DATA:  Aortic aneurysm, hypertension  EXAM: ULTRASOUND OF ABDOMINAL AORTA  TECHNIQUE: Ultrasound examination of the abdominal aorta was performed to evaluate for abdominal aortic aneurysm.  COMPARISON:  Arteriogram from earlier the same day, and prior studies  FINDINGS: Abdominal Aorta  There is a fusiform distal abdominal aortic aneurysm.  Maximum AP  Diameter:  3.5 cm  Maximum TRV  Diameter: 3.9 cm (previously 3.5 cm on 06/16/2012 by my measurement)  Common iliac arteries are dilated, right 28 mm diameter, left 24 mm.  IMPRESSION: 1. Slight enlargement of 3.9 cm  fusiform abdominal aortic aneurysm and aneurysmal common iliac arteries as above. Recommend follow up by [place] in 2years. This recommendation follows ACR consensus guidelines: White Paper of the ACR Incidental Findings Committee II on Vascular Findings. Joellyn Rued [id number]; 18:841-660.   Electronically Signed   By: Arne Cleveland M.D.   On: 07/03/2013 10:46   Dg Chest Port 1 View  07/04/2013   CLINICAL DATA:  Evaluate endotracheal tube position  EXAM: PORTABLE CHEST - 1 VIEW  COMPARISON:  Portable chest x-ray of 07/03/2013  FINDINGS: The tip of the endotracheal tube is approximately 7.8 cm above the carina. Haziness at both lung bases most likely represents effusions and atelectasis. Mild cardiomegaly is stable. The left central venous line tip overlies the mid SVC.  IMPRESSION: 1. Endotracheal tube tip 7.8 cm above the carina. 2. Little change in probable small effusions and basilar atelectasis.   Electronically Signed   By: Ivar Drape M.D.   On: 07/04/2013 08:12   Dg Chest Port 1 View  07/03/2013   CLINICAL DATA:  Intubation.  EXAM: PORTABLE CHEST - 1 VIEW  COMPARISON:  DG CHEST 1V PORT dated 07/01/2013  FINDINGS: Endotracheal tube noted with its tip approximately 6 cm above the carina. Left IJ tube noted with tip projected over superior vena cava. Cardiomegaly, normal pulmonary vascularity. Bilateral basilar interstitial lung infiltrates with Kerley B-lines are noted. Left-sided pleural effusion. These findings are consistent with congestive heart  failure with pulmonary edema. Prior CABG. No pneumothorax or acute bony abnormality.  IMPRESSION: 1. Endotracheal tube and left IJ line in good anatomic position. 2. Findings suggesting congestive heart failure with interstitial edema and small left pleural effusion.   Electronically Signed   By: Marcello Moores  Register   On: 07/03/2013 01:56   Dg Abd Portable 1v  07/03/2013   CLINICAL DATA:  Nasogastric tube placement.  EXAM: PORTABLE ABDOMEN - 1 VIEW  COMPARISON:  US  AORTA PORT dated 07/03/2013; DG BE dated 11/16/2006; CT ABDOMEN W/O CM dated 10/01/2006  FINDINGS: Prior median sternotomy, CABG, and left lower thoracic stent. Small left pleural effusion.  Nasogastric tube side port is in the gastric cardia with distal tip in the gastric body.  Thoracolumbar spondylosis.  IMPRESSION: 1. Nasogastric tube side port in the gastric cardia with tip in the gastric body. 2. Small left pleural effusion.   Electronically Signed   By: Sherryl Barters M.D.   On: 07/03/2013 12:01    Anti-infectives: Anti-infectives   Start     Dose/Rate Route Frequency Ordered Stop   07/04/13 0800  vancomycin (VANCOCIN) IVPB 750 mg/150 ml premix     750 mg 150 mL/hr over 60 Minutes Intravenous Every 24 hours 07/03/13 0814     07/03/13 0900  cefTAZidime (FORTAZ) 1 g in dextrose 5 % 50 mL IVPB     1 g 100 mL/hr over 30 Minutes Intravenous Every 12 hours 07/03/13 0814     07/03/13 0815  vancomycin (VANCOCIN) IVPB 1000 mg/200 mL premix     1,000 mg 200 mL/hr over 60 Minutes Intravenous  Once 07/03/13 0814 07/03/13 0937   07/03/13 0204  ceFAZolin (ANCEF) 2-3 GM-% IVPB SOLR    Comments:  Thelma Barge   : cabinet override      07/03/13 0204 07/03/13 0313      Assessment/Plan: s/p Procedure(s) with comments: COLONOSCOPY  (N/A) - Patient is already sedated on the ventilator and may need minimal if any additional sedation.  Please do at bedside.  Will place NG and give some Nulytely as prep. ESOPHAGOGASTRODUODENOSCOPY (EGD) (N/A) GIVENS CAPSULE STUDY (N/A) GIB  Source undetermined.  Looks stable at this point.  Would continue to follow.  No acute surgery indicated.   LOS: 8 days    Raine Blodgett A. 07/04/2013

## 2013-07-04 NOTE — Procedures (Signed)
Extubation Procedure Note  Patient Details:   Name: [name] DOB: 1930/11/14 MRN: [id number]   Airway Documentation:     Evaluation  O2 sats: stable throughout Complications: No apparent complications Patient did tolerate procedure well. Bilateral Breath Sounds: Clear;Diminished Suctioning: Airway Yes  Mcneil Sober 07/04/2013, 9:44 AM

## 2013-07-04 NOTE — Progress Notes (Signed)
PULMONARY  / CRITICAL CARE MEDICINE  Name: [name] MRN: [id number] DOB: 28-Nov-1930 PCP Horton Finer, MD    ADMISSION DATE:  06/26/2013  LOS 8 days  CONSULTATION DATE:  07/01/13  REFERRING MD :  Dr Delfina Redwood  PRIMARY SERVICE: Sadie Haber -> PCCM  CHIEF COMPLAINT:  LGI bleed with hemodynamic instability  BRIEF PATIENT DESCRIPTION: 80 year old male with hx of polyps but refused screening scopies. Also A Fib and bioprosthetic aortic coumadin.s/p neo bladder early 67s Admitted 06/26/2013 wih 10 day shx of melena in setting of high INR 4. Underwent upper and lower endoscopies as above without sign of bleed. Also s/p polypectomnt Then on 07/01/13: had futher bleed but BRPR this time with bp/hr instability.  SIGNIFICANT EVENTS / STUDIES:  06/26/2013 - admit 3./11/15 - ugi scope - normal 06/30/13 - Colonsocpsy - ascending colon polypectomy. Scattered diverticulosis. Hemorrhoids. No active bleed 07/01/13 - Move tO ICU. Nuclear medicine scan negative for bleed. Foley inserted by Dr Risa Grill 3/15 angio neg, ett 3/16- remains on pressors, further bloody stools noted, 4 units prbc  3/16- colo- blood throughout colon, capsule endo deployed, HBG improved 3/16 [place] ABDo for AAA>>Slight enlargement of 3.9 cm fusiform abdominal aortic aneurysm<BR>and aneurysmal common iliac arteries as above  LINES / TUBES: CVL 07/01/13 - Foley (Dr Risa Grill) 07/01/13 3/16 ett>>>  CULTURES: none  ANTIBIOTICS: Anti-infectives   Start     Dose/Rate Route Frequency Ordered Stop   07/04/13 0800  vancomycin (VANCOCIN) IVPB 750 mg/150 ml premix     750 mg 150 mL/hr over 60 Minutes Intravenous Every 24 hours 07/03/13 0814     07/03/13 0900  cefTAZidime (FORTAZ) 1 g in dextrose 5 % 50 mL IVPB     1 g 100 mL/hr over 30 Minutes Intravenous Every 12 hours 07/03/13 0814     07/03/13 0815  vancomycin (VANCOCIN) IVPB 1000 mg/200 mL premix     1,000 mg 200 mL/hr over 60 Minutes Intravenous  Once 07/03/13 0814 07/03/13 0937    07/03/13 0204  ceFAZolin (ANCEF) 2-3 GM-% IVPB SOLR    Comments:  Thelma Barge   : cabinet override      07/03/13 0204 07/03/13 0313    \   SUBJECTIVE:  Awake, colono blood, hgb better  VITAL SIGNS: Filed Vitals:   07/04/13 0500 07/04/13 0600 07/04/13 0820 07/04/13 0840  BP:      Pulse: 104 93 104   Temp:    97.7 F (36.5 C)  TempSrc:    Oral  Resp: $Remo'19 26 10   'xorkl$ Height:      Weight:      SpO2: 94% 100% 100%       VENTILATOR SETTINGS:  INTAKE / OUTPUT: I/O last 3 completed shifts: In: 6195.3 [I.V.:3464; Blood:617.5; NG/GT:1700; IV Piggyback:413.8] Out: 1920 [Urine:1905; Stool:15]     PHYSICAL EXAMINATION: General: awake, appears stong Neuro: rass 0 following commands HEENT:  Neck supple, ett wnl Cardiovascular:  S1S2 +. No murmurs Lungs:  Cta Abdomen:  surgial scar +. Soft, non tender, normo active bowel sounds Musculoskeletal: mild edema Skin:  intact  LABS: PULMONARY  Recent Labs Lab 07/03/13 0459 07/03/13 0556 07/03/13 0913 07/04/13 0409 07/04/13 0904  PHART 7.223* 7.254* 7.293* 7.348* 7.347*  PCO2ART 35.7 28.6* 29.8* 28.9* 31.1*  PO2ART 425.0* 170.0* 143.0* 144.0* 158.0*  HCO3 14.8* 12.6* 14.5* 16.0* 17.2*  TCO2 $Rem'16 13 15 17 18  'FIyG$ O2SAT 100.0 99.0 99.0 99.0 99.0    CBC  Recent Labs Lab 07/04/13 0008 07/04/13 0408 07/04/13  0830  HGB 8.5* 7.7* 7.9*  HCT 24.0* 21.9* 22.2*  WBC 22.3* 18.8* 18.2*  PLT 89* 84* 82*    COAGULATION  Recent Labs Lab 06/30/13 0545 07/01/13 0523 07/02/13 0716 07/02/13 1600 07/03/13 0830  INR 1.96* 2.21* 1.15 1.20 1.44    CARDIAC    Recent Labs Lab 07/01/13 1957 07/02/13 1833 07/03/13 0036 07/03/13 0520 07/03/13 1236  TROPONINI <0.30 <0.30 <0.30 <0.30 <0.30    Recent Labs Lab 07/03/13 0500  PROBNP 7905.0*     CHEMISTRY  Recent Labs Lab 07/01/13 1647 07/02/13 0716 07/03/13 0100 07/03/13 0500 07/04/13 0408  NA 139 137 140 137 137  K 3.9 3.7 4.1 3.9 3.2*  CL 109 104 114* 111  108  CO2 18* 18* 13* 12* 16*  GLUCOSE 143* 98 170* 188* 161*  BUN 63* 60* 56* 56* 51*  CREATININE 2.69* 2.70* 2.29* 2.43* 2.63*  CALCIUM 7.1* 7.5* 6.1* 6.5* 6.7*  MG 2.0 2.0  --  1.8 1.8  PHOS 3.9 3.7  --  4.2 3.3   Estimated Creatinine Clearance: 21.7 ml/min (by C-G formula based on Cr of 2.63).   LIVER  Recent Labs Lab 06/30/13 0545 07/01/13 0523 07/02/13 0716 07/02/13 1600 07/03/13 0100 07/03/13 0830 07/04/13 0408  AST  --   --   --   --  13  --  15  ALT  --   --   --   --  9  --  8  ALKPHOS  --   --   --   --  42  --  60  BILITOT  --   --   --   --  1.1  --  0.4  PROT  --   --   --   --  2.9*  --  3.3*  ALBUMIN  --   --   --   --  1.2*  --  1.4*  INR 1.96* 2.21* 1.15 1.20  --  1.44  --      INFECTIOUS  Recent Labs Lab 07/03/13 0036 07/03/13 0555 07/03/13 1236  LATICACIDVEN 3.5* 1.6 0.9     ENDOCRINE CBG (last 3)   Recent Labs  07/03/13 2349 07/04/13 0358 07/04/13 0814  GLUCAP 209* 157* 152*      IMAGING x48h  US Aorta Port  07/03/2013   CLINICAL DATA:  Aortic aneurysm, hypertension  EXAM: ULTRASOUND OF ABDOMINAL AORTA  TECHNIQUE: Ultrasound examination of the abdominal aorta was performed to evaluate for abdominal aortic aneurysm.  COMPARISON:  Arteriogram from earlier the same day, and prior studies  FINDINGS: Abdominal Aorta  There is a fusiform distal abdominal aortic aneurysm.  Maximum AP  Diameter:  3.5 cm  Maximum TRV  Diameter: 3.9 cm (previously 3.5 cm on 06/16/2012 by my measurement)  Common iliac arteries are dilated, right 28 mm diameter, left 24 mm.  IMPRESSION: 1. Slight enlargement of 3.9 cm fusiform abdominal aortic aneurysm and aneurysmal common iliac arteries as above. Recommend follow up by [place] in 2years. This recommendation follows ACR consensus guidelines: White Paper of the ACR Incidental Findings Committee II on Vascular Findings. Joellyn Rued [id number]; 13:244-010.   Electronically Signed   By: Arne Cleveland M.D.   On: 07/03/2013  10:46   Dg Chest Port 1 View  07/04/2013   CLINICAL DATA:  Evaluate endotracheal tube position  EXAM: PORTABLE CHEST - 1 VIEW  COMPARISON:  Portable chest x-ray of 07/03/2013  FINDINGS: The tip of the endotracheal tube is approximately 7.8 cm  above the carina. Haziness at both lung bases most likely represents effusions and atelectasis. Mild cardiomegaly is stable. The left central venous line tip overlies the mid SVC.  IMPRESSION: 1. Endotracheal tube tip 7.8 cm above the carina. 2. Little change in probable small effusions and basilar atelectasis.   Electronically Signed   By: Ivar Drape M.D.   On: 07/04/2013 08:12   Dg Chest Port 1 View  07/03/2013   CLINICAL DATA:  Intubation.  EXAM: PORTABLE CHEST - 1 VIEW  COMPARISON:  DG CHEST 1V PORT dated 07/01/2013  FINDINGS: Endotracheal tube noted with its tip approximately 6 cm above the carina. Left IJ tube noted with tip projected over superior vena cava. Cardiomegaly, normal pulmonary vascularity. Bilateral basilar interstitial lung infiltrates with Kerley B-lines are noted. Left-sided pleural effusion. These findings are consistent with congestive heart failure with pulmonary edema. Prior CABG. No pneumothorax or acute bony abnormality.  IMPRESSION: 1. Endotracheal tube and left IJ line in good anatomic position. 2. Findings suggesting congestive heart failure with interstitial edema and small left pleural effusion.   Electronically Signed   By: Marcello Moores  Register   On: 07/03/2013 01:56   Dg Abd Portable 1v  07/03/2013   CLINICAL DATA:  Nasogastric tube placement.  EXAM: PORTABLE ABDOMEN - 1 VIEW  COMPARISON:  US AORTA PORT dated 07/03/2013; DG BE dated 11/16/2006; CT ABDOMEN W/O CM dated 10/01/2006  FINDINGS: Prior median sternotomy, CABG, and left lower thoracic stent. Small left pleural effusion.  Nasogastric tube side port is in the gastric cardia with distal tip in the gastric body.  Thoracolumbar spondylosis.  IMPRESSION: 1. Nasogastric tube side port in  the gastric cardia with tip in the gastric body. 2. Small left pleural effusion.   Electronically Signed   By: Sherryl Barters M.D.   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 Source not clear P:   NPO [place] AAA reviewed For info from capsule today Cbc follow up ppi  HEMATOLOGIC A:  Anemia of bleeding. Resolved coagulapathy from coumadin resolved P:  Cbc to q8h See gi Fibrinogen 197, no cryo scd  INFECTIOUS A: Continued shock, at risk transient bacteremia, UTI (difficult to place, bladder ca) P:   empiric vanc, ceftaz UA pos, culture May need foley change, but would need urology for this, will need serial UA  ENDOCRINE A:  R/o rel AI P:   Monitor cortisol awaited stress steroids  NEUROLOGIC A:  Vent dyschronny P:   fent dc this am  upright   TODAY'S SUMMARY:  UTI, keep abx, weaning well, hope to extubate, await capsule  Ccm time 30 min   Lavon Paganini. Titus Mould, MD, [place] Pgr: [phone] [facility]

## 2013-07-04 NOTE — Consult Note (Signed)
HPI: 80 yo male with past medical history of coronary artery disease status post coronary artery bypass and graft, TAVR in 2013 and permanent atrial fibrillation for evaluation of atrial fibrillation. Last echocardiogram at [facility] in April of 2014 showed normal LV function, moderate left ventricular hypertrophy, prosthetic aortic valve with mild aortic insufficiency, mild mitral stenosis and regurgitation. Patient has chronic dyspnea on exertion and lower extremity edema. No orthopnea or PND. No chest pain, palpitations or syncope. Admitted with weakness and fatigue as well as increasing dyspnea. Also complained of melena. Initial hemoglobin 6.3. Patient has been transfused. Patient had polypectomy on March 14 but then suffered another GI bleed with associated hemodynamic instability. EGD was unremarkable. Capsule endoscopy showed no bleeding source. Arteriogram essentially negative by report. Nuclear scan negative as well. Nuclear scan negative as well. Patient at present is without complaints. Cardiology is asked to evaluate for his atrial fibrillation.  Medications Prior to Admission  Medication Sig Dispense Refill  . albuterol (PROVENTIL HFA;VENTOLIN HFA) 108 (90 BASE) MCG/ACT inhaler Inhale 2 puffs into the lungs every 6 (six) hours as needed for wheezing or shortness of breath.      Marland Kitchen atorvastatin (LIPITOR) 40 MG tablet Take 40 mg by mouth daily at 6 PM.       . citric acid-sodium citrate (ORACIT) 334-500 MG/5ML solution Take 15 mLs by mouth every morning. 1 Tablespoon Daily      . colchicine 0.6 MG tablet Take 0.6 mg by mouth daily as needed (for gout).      . Cyanocobalamin (VITAMIN B-12 IJ) Inject 1 each as directed every 30 (thirty) days.      . famotidine (TH FAMOTIDINE 10) 10 MG tablet Take 10 mg by mouth every morning.      . febuxostat (ULORIC) 40 MG tablet Take 40 mg by mouth every morning.      . furosemide (LASIX) 40 MG tablet Take 40 mg by mouth every morning.      .  metolazone (ZAROXOLYN) 2.5 MG tablet Take 2.5 mg by mouth daily as needed (for fluid).      . metoprolol tartrate (LOPRESSOR) 25 MG tablet Take 50 mg by mouth every morning.      . [DISCONTINUED] aspirin 81 MG tablet Take 81 mg by mouth every morning.      . [DISCONTINUED] Multiple Vitamins-Iron (MULTIVITAMINS WITH IRON) TABS tablet Take 1 tablet by mouth every evening.       . [DISCONTINUED] warfarin (COUMADIN) 5 MG tablet Take 5 mg by mouth every morning. Takes $RemoveBefore'5mg'QOlKowPYelNfd$  on  Monday and Friday . Takes 2.$RemoveB'5mg'PBmadHAe$  all other days        Allergies  Allergen Reactions  . Ramipril Other (See Comments)    unknown    Past Medical History  Diagnosis Date  . Aortic valve disorder     s/p TAVR at [facility]   . PVD (peripheral vascular disease)   . Hypercholesteremia   . HTN (hypertension)   . Atrial fibrillation     Chronic  . Atrial flutter   . CKD (chronic kidney disease)   . AAA (abdominal aortic aneurysm)   . CAD (coronary artery disease)     Status post CABG, stent to diagonal prior to TAVR  . Bladder cancer   . Gallstone   . Vitamin B12 deficiency   . Colon cancer   . Carotid stenosis   . Gout     Past Surgical History  Procedure Laterality Date  . Coronary artery  bypass graft    . Laminectomy    . Appendectomy    . Colectomy    . Toe surgery    . Hemicolectomy    . Mohs surgery    . Angioplasty    . Esophagogastroduodenoscopy N/A 06/28/2013    Procedure: ESOPHAGOGASTRODUODENOSCOPY (EGD);  Surgeon: Winfield Cunas., MD;  Location: [facility] ENDOSCOPY;  Service: Endoscopy;  Laterality: N/A;  . Colonoscopy N/A 06/30/2013    Procedure: COLONOSCOPY;  Surgeon: Winfield Cunas., MD;  Location: [facility] ENDOSCOPY;  Service: Endoscopy;  Laterality: N/A;  . Colonoscopy with esophagogastroduodenoscopy (egd) N/A 07/03/2013    Procedure: COLONOSCOPY ;  Surgeon: Missy Sabins, MD;  Location: [place];  Service: Endoscopy;  Laterality: N/A;  Patient is already sedated on the ventilator and may need minimal  if any additional sedation.  Please do at bedside.  Will place NG and give some Nulytely as prep.  . Esophagogastroduodenoscopy N/A 07/03/2013    Procedure: ESOPHAGOGASTRODUODENOSCOPY (EGD);  Surgeon: Missy Sabins, MD;  Location: [facility] ENDOSCOPY;  Service: Endoscopy;  Laterality: N/A;  . Givens capsule study N/A 07/03/2013    Procedure: GIVENS CAPSULE STUDY;  Surgeon: Missy Sabins, MD;  Location: [place];  Service: Endoscopy;  Laterality: N/A;    History   Social History  . Marital Status: Married    Spouse Name: N/A    Number of Children: 3  . Years of Education: college   Occupational History  . RETIRED    Social History Main Topics  . Smoking status: Former Research scientist (life sciences)  . Smokeless tobacco: Never Used  . Alcohol Use: Yes     Comment: occasional  . Drug Use: No  . Sexual Activity: Not on file   Other Topics Concern  . Not on file   Social History Narrative  . No narrative on file    Family History  Problem Relation Age of Onset  . Cancer Mother     Colon cancer  . Heart disease Father   . Diabetes Father   . Cancer Brother     esophageal    ROS:  no fevers or chills, productive cough, hemoptysis, dysphasia, odynophagia, dysuria, hematuria, rash, seizure activity, orthopnea, PND, claudication. Remaining systems are negative.  Physical Exam:   Blood pressure 121/63, pulse 132, temperature 97.9 F (36.6 C), temperature source Oral, resp. rate 16, height $RemoveBe'5\' 8"'pzWZCNRJH$  (1.727 m), weight 166 lb 3.6 oz (75.4 kg), SpO2 100.00%.  General:  Well developed/well nourished in NAD Skin warm/dry Patient not depressed No peripheral clubbing Back-normal HEENT-normal/normal eyelids Neck supple/normal carotid upstroke bilaterally; no bruits; no JVD; no thyromegaly chest - Mildly diminished BS bases CV - irregular and tachycardic/normal S1 and S2; no rubs or gallops; 2/6 systolic murmur Abdomen -NT/ND, no HSM, no mass, + bowel sounds, no bruit 2+ femoral pulses, no  bruits Ext-1+edema, no chords Neuro-grossly nonfocal  ECG 06/26/2013-atrial fibrillation, right bundle branch block, nonspecific T-wave changes.  Results for orders placed during the hospital encounter of 06/26/13 (from the past 48 hour(s))  PROTIME-INR     Status: None   Collection Time    07/02/13  4:00 PM      Result Value Ref Range   Prothrombin Time 14.9  11.6 - 15.2 seconds   INR 1.20  0.00 - 1.49  TROPONIN I     Status: None   Collection Time    07/02/13  6:33 PM      Result Value Ref Range   Troponin I <0.30  <  0.30 ng/mL   Comment:            Due to the release kinetics of cTnI,     a negative result within the first hours     of the onset of symptoms does not rule out     myocardial infarction with certainty.     If myocardial infarction is still suspected,     repeat the test at appropriate intervals.  CBC     Status: Abnormal   Collection Time    07/02/13  7:40 PM      Result Value Ref Range   WBC 12.5 (*) 4.0 - 10.5 K/uL   RBC 2.17 (*) 4.22 - 5.81 MIL/uL   Hemoglobin 6.7 (*) 13.0 - 17.0 g/dL   Comment: REPEATED TO VERIFY     CRITICAL RESULT CALLED TO, READ BACK BY AND VERIFIED WITH:     GROCE S. RN AT 2008 ON 07/02/13 BY INGOLD L   HCT 19.0 (*) 39.0 - 52.0 %   MCV 87.6  78.0 - 100.0 fL   MCH 30.9  26.0 - 34.0 pg   MCHC 35.3  30.0 - 36.0 g/dL   RDW 15.6 (*) 11.5 - 15.5 %   Platelets 130 (*) 150 - 400 K/uL  PREPARE RBC (CROSSMATCH)     Status: None   Collection Time    07/02/13  8:20 PM      Result Value Ref Range   Order Confirmation ORDER PROCESSED BY BLOOD BANK    LACTIC ACID, PLASMA     Status: Abnormal   Collection Time    07/03/13 12:36 AM      Result Value Ref Range   Lactic Acid, Venous 3.5 (*) 0.5 - 2.2 mmol/L  TROPONIN I     Status: None   Collection Time    07/03/13 12:36 AM      Result Value Ref Range   Troponin I <0.30  <0.30 ng/mL   Comment:            Due to the release kinetics of cTnI,     a negative result within the first hours      of the onset of symptoms does not rule out     myocardial infarction with certainty.     If myocardial infarction is still suspected,     repeat the test at appropriate intervals.  PREPARE RBC (CROSSMATCH)     Status: None   Collection Time    07/03/13  1:00 AM      Result Value Ref Range   Order Confirmation ORDER PROCESSED BY BLOOD BANK    COMPREHENSIVE METABOLIC PANEL     Status: Abnormal   Collection Time    07/03/13  1:00 AM      Result Value Ref Range   Sodium 140  137 - 147 mEq/L   Potassium 4.1  3.7 - 5.3 mEq/L   Chloride 114 (*) 96 - 112 mEq/L   CO2 13 (*) 19 - 32 mEq/L   Glucose, Bld 170 (*) 70 - 99 mg/dL   BUN 56 (*) 6 - 23 mg/dL   Creatinine, Ser 2.29 (*) 0.50 - 1.35 mg/dL   Calcium 6.1 (*) 8.4 - 10.5 mg/dL   Comment: CRITICAL RESULT CALLED TO, READ BACK BY AND VERIFIED WITH:     A. STOPHEL (RN) [id number] 07/03/2013 L. LOMAX   Total Protein 2.9 (*) 6.0 - 8.3 g/dL   Albumin 1.2 (*) 3.5 - 5.2 g/dL   AST  13  0 - 37 U/L   ALT 9  0 - 53 U/L   Alkaline Phosphatase 42  39 - 117 U/L   Total Bilirubin 1.1  0.3 - 1.2 mg/dL   GFR calc non Af Amer 25 (*) >90 mL/min   GFR calc Af Amer 29 (*) >90 mL/min   Comment: (NOTE)     The eGFR has been calculated using the CKD EPI equation.     This calculation has not been validated in all clinical situations.     eGFR's persistently <90 mL/min signify possible Chronic Kidney     Disease.  POCT I-STAT 3, BLOOD GAS (G3+)     Status: Abnormal   Collection Time    07/03/13  4:59 AM      Result Value Ref Range   pH, Arterial 7.223 (*) 7.350 - 7.450   pCO2 arterial 35.7  35.0 - 45.0 mmHg   pO2, Arterial 425.0 (*) 80.0 - 100.0 mmHg   Bicarbonate 14.8 (*) 20.0 - 24.0 mEq/L   TCO2 16  0 - 100 mmol/L   O2 Saturation 100.0     Acid-base deficit 12.0 (*) 0.0 - 2.0 mmol/L   Patient temperature 98.2 F     Collection site ARTERIAL LINE     Drawn by RT     Sample type ARTERIAL    BASIC METABOLIC PANEL     Status: Abnormal   Collection Time     07/03/13  5:00 AM      Result Value Ref Range   Sodium 137  137 - 147 mEq/L   Potassium 3.9  3.7 - 5.3 mEq/L   Chloride 111  96 - 112 mEq/L   CO2 12 (*) 19 - 32 mEq/L   Glucose, Bld 188 (*) 70 - 99 mg/dL   BUN 56 (*) 6 - 23 mg/dL   Creatinine, Ser 2.43 (*) 0.50 - 1.35 mg/dL   Calcium 6.5 (*) 8.4 - 10.5 mg/dL   GFR calc non Af Amer 24 (*) >90 mL/min   GFR calc Af Amer 27 (*) >90 mL/min   Comment: (NOTE)     The eGFR has been calculated using the CKD EPI equation.     This calculation has not been validated in all clinical situations.     eGFR's persistently <90 mL/min signify possible Chronic Kidney     Disease.  PHOSPHORUS     Status: None   Collection Time    07/03/13  5:00 AM      Result Value Ref Range   Phosphorus 4.2  2.3 - 4.6 mg/dL  MAGNESIUM     Status: None   Collection Time    07/03/13  5:00 AM      Result Value Ref Range   Magnesium 1.8  1.5 - 2.5 mg/dL  PRO B NATRIURETIC PEPTIDE     Status: Abnormal   Collection Time    07/03/13  5:00 AM      Result Value Ref Range   Pro B Natriuretic peptide (BNP) 7905.0 (*) 0 - 450 pg/mL  TROPONIN I     Status: None   Collection Time    07/03/13  5:20 AM      Result Value Ref Range   Troponin I <0.30  <0.30 ng/mL   Comment:            Due to the release kinetics of cTnI,     a negative result within the first hours     of the  onset of symptoms does not rule out     myocardial infarction with certainty.     If myocardial infarction is still suspected,     repeat the test at appropriate intervals.  LACTIC ACID, PLASMA     Status: None   Collection Time    07/03/13  5:55 AM      Result Value Ref Range   Lactic Acid, Venous 1.6  0.5 - 2.2 mmol/L  POCT I-STAT 3, BLOOD GAS (G3+)     Status: Abnormal   Collection Time    07/03/13  5:56 AM      Result Value Ref Range   pH, Arterial 7.254 (*) 7.350 - 7.450   pCO2 arterial 28.6 (*) 35.0 - 45.0 mmHg   pO2, Arterial 170.0 (*) 80.0 - 100.0 mmHg   Bicarbonate 12.6 (*) 20.0 -  24.0 mEq/L   TCO2 13  0 - 100 mmol/L   O2 Saturation 99.0     Acid-base deficit 13.0 (*) 0.0 - 2.0 mmol/L   Patient temperature 98.6 F     Collection site ARTERIAL LINE     Drawn by RT     Sample type ARTERIAL    CBC     Status: Abnormal   Collection Time    07/03/13  7:50 AM      Result Value Ref Range   WBC 19.2 (*) 4.0 - 10.5 K/uL   RBC 2.98 (*) 4.22 - 5.81 MIL/uL   Hemoglobin 9.2 (*) 13.0 - 17.0 g/dL   Comment: RESULT REPEATED AND VERIFIED     POST TRANSFUSION SPECIMEN   HCT 26.0 (*) 39.0 - 52.0 %   MCV 87.2  78.0 - 100.0 fL   MCH 30.9  26.0 - 34.0 pg   MCHC 35.4  30.0 - 36.0 g/dL   RDW 14.5  11.5 - 15.5 %   Platelets 95 (*) 150 - 400 K/uL   Comment: PLATELET COUNT CONFIRMED BY SMEAR  FIBRINOGEN     Status: Abnormal   Collection Time    07/03/13  8:08 AM      Result Value Ref Range   Fibrinogen 197 (*) 204 - 475 mg/dL  CBC     Status: Abnormal   Collection Time    07/03/13  8:08 AM      Result Value Ref Range   WBC 17.4 (*) 4.0 - 10.5 K/uL   RBC 2.81 (*) 4.22 - 5.81 MIL/uL   Hemoglobin 8.6 (*) 13.0 - 17.0 g/dL   HCT 24.3 (*) 39.0 - 52.0 %   MCV 86.5  78.0 - 100.0 fL   MCH 30.6  26.0 - 34.0 pg   MCHC 35.4  30.0 - 36.0 g/dL   RDW 14.6  11.5 - 15.5 %   Platelets 77 (*) 150 - 400 K/uL   Comment: CONSISTENT WITH PREVIOUS RESULT  APTT     Status: None   Collection Time    07/03/13  8:30 AM      Result Value Ref Range   aPTT 33  24 - 37 seconds  PROTIME-INR     Status: Abnormal   Collection Time    07/03/13  8:30 AM      Result Value Ref Range   Prothrombin Time 17.2 (*) 11.6 - 15.2 seconds   INR 1.44  0.00 - 1.49  POCT I-STAT 3, BLOOD GAS (G3+)     Status: Abnormal   Collection Time    07/03/13  9:13 AM  Result Value Ref Range   pH, Arterial 7.293 (*) 7.350 - 7.450   pCO2 arterial 29.8 (*) 35.0 - 45.0 mmHg   pO2, Arterial 143.0 (*) 80.0 - 100.0 mmHg   Bicarbonate 14.5 (*) 20.0 - 24.0 mEq/L   TCO2 15  0 - 100 mmol/L   O2 Saturation 99.0     Acid-base  deficit 11.0 (*) 0.0 - 2.0 mmol/L   Patient temperature 98.1 F     Collection site ARTERIAL LINE     Drawn by RT     Sample type ARTERIAL    URINALYSIS, ROUTINE W REFLEX MICROSCOPIC     Status: Abnormal   Collection Time    07/03/13  9:32 AM      Result Value Ref Range   Color, Urine YELLOW  YELLOW   APPearance CLOUDY (*) CLEAR   Specific Gravity, Urine 1.021  1.005 - 1.030   pH 6.0  5.0 - 8.0   Glucose, UA NEGATIVE  NEGATIVE mg/dL   Hgb urine dipstick LARGE (*) NEGATIVE   Bilirubin Urine NEGATIVE  NEGATIVE   Ketones, ur NEGATIVE  NEGATIVE mg/dL   Protein, ur 30 (*) NEGATIVE mg/dL   Urobilinogen, UA 0.2  0.0 - 1.0 mg/dL   Nitrite NEGATIVE  NEGATIVE   Leukocytes, UA MODERATE (*) NEGATIVE  URINE MICROSCOPIC-ADD ON     Status: Abnormal   Collection Time    07/03/13  9:32 AM      Result Value Ref Range   Squamous Epithelial / LPF FEW (*) RARE   WBC, UA TOO NUMEROUS TO COUNT  <3 WBC/hpf   RBC / HPF 11-20  <3 RBC/hpf   Bacteria, UA MANY (*) RARE  CULTURE, BLOOD (ROUTINE X 2)     Status: None   Collection Time    07/03/13  9:35 AM      Result Value Ref Range   Specimen Description BLOOD RIGHT HAND     Special Requests       Value: BOTTLES DRAWN AEROBIC AND ANAEROBIC 10CC BLUE, 5CC RED   Culture  Setup Time       Value: 07/03/2013 14:00     Performed at [facility]   Culture       Value:        BLOOD CULTURE RECEIVED NO GROWTH TO DATE CULTURE WILL BE HELD FOR 5 DAYS BEFORE ISSUING A FINAL NEGATIVE REPORT     Performed at [facility]   Report Status PENDING    CULTURE, BLOOD (ROUTINE X 2)     Status: None   Collection Time    07/03/13  9:52 AM      Result Value Ref Range   Specimen Description BLOOD RIGHT HAND     Special Requests BOTTLES DRAWN AEROBIC ONLY 10CC     Culture  Setup Time       Value: 07/03/2013 14:00     Performed at [facility]   Culture       Value:        BLOOD CULTURE RECEIVED NO GROWTH TO DATE CULTURE WILL BE HELD FOR 5 DAYS  BEFORE ISSUING A FINAL NEGATIVE REPORT     Performed at [facility]   Report Status PENDING    CBC     Status: Abnormal   Collection Time    07/03/13 12:08 PM      Result Value Ref Range   WBC 20.2 (*) 4.0 - 10.5 K/uL   RBC 2.99 (*) 4.22 -  5.81 MIL/uL   Hemoglobin 8.9 (*) 13.0 - 17.0 g/dL   HCT 00.7 (*) 54.0 - 99.8 %   MCV 84.9  78.0 - 100.0 fL   MCH 29.8  26.0 - 34.0 pg   MCHC 35.0  30.0 - 36.0 g/dL   RDW 88.4  14.9 - 53.9 %   Platelets 89 (*) 150 - 400 K/uL   Comment: SPECIMEN CHECKED FOR CLOTS     REPEATED TO VERIFY     CONSISTENT WITH PREVIOUS RESULT  GLUCOSE, CAPILLARY     Status: Abnormal   Collection Time    07/03/13 12:13 PM      Result Value Ref Range   Glucose-Capillary 248 (*) 70 - 99 mg/dL  LACTIC ACID, PLASMA     Status: None   Collection Time    07/03/13 12:36 PM      Result Value Ref Range   Lactic Acid, Venous 0.9  0.5 - 2.2 mmol/L  TROPONIN I     Status: None   Collection Time    07/03/13 12:36 PM      Result Value Ref Range   Troponin I <0.30  <0.30 ng/mL   Comment:            Due to the release kinetics of cTnI,     a negative result within the first hours     of the onset of symptoms does not rule out     myocardial infarction with certainty.     If myocardial infarction is still suspected,     repeat the test at appropriate intervals.  CBC     Status: Abnormal   Collection Time    07/03/13  4:08 PM      Result Value Ref Range   WBC 21.7 (*) 4.0 - 10.5 K/uL   RBC 2.92 (*) 4.22 - 5.81 MIL/uL   Hemoglobin 9.0 (*) 13.0 - 17.0 g/dL   HCT 84.0 (*) 58.6 - 41.0 %   MCV 85.3  78.0 - 100.0 fL   MCH 30.8  26.0 - 34.0 pg   MCHC 36.1 (*) 30.0 - 36.0 g/dL   RDW 51.7  10.7 - 85.0 %   Platelets 95 (*) 150 - 400 K/uL   Comment: CONSISTENT WITH PREVIOUS RESULT  GLUCOSE, CAPILLARY     Status: Abnormal   Collection Time    07/03/13  6:11 PM      Result Value Ref Range   Glucose-Capillary 209 (*) 70 - 99 mg/dL  CBC     Status: Abnormal    Collection Time    07/03/13  8:08 PM      Result Value Ref Range   WBC 22.1 (*) 4.0 - 10.5 K/uL   RBC 2.89 (*) 4.22 - 5.81 MIL/uL   Hemoglobin 8.8 (*) 13.0 - 17.0 g/dL   HCT 20.7 (*) 98.1 - 74.7 %   MCV 85.1  78.0 - 100.0 fL   MCH 30.4  26.0 - 34.0 pg   MCHC 35.8  30.0 - 36.0 g/dL   RDW 86.8  97.6 - 93.0 %   Platelets 98 (*) 150 - 400 K/uL   Comment: CONSISTENT WITH PREVIOUS RESULT  GLUCOSE, CAPILLARY     Status: Abnormal   Collection Time    07/03/13  8:16 PM      Result Value Ref Range   Glucose-Capillary 213 (*) 70 - 99 mg/dL  GLUCOSE, CAPILLARY     Status: Abnormal   Collection Time    07/03/13 11:49  PM      Result Value Ref Range   Glucose-Capillary 209 (*) 70 - 99 mg/dL  CBC     Status: Abnormal   Collection Time    07/04/13 12:08 AM      Result Value Ref Range   WBC 22.3 (*) 4.0 - 10.5 K/uL   RBC 2.81 (*) 4.22 - 5.81 MIL/uL   Hemoglobin 8.5 (*) 13.0 - 17.0 g/dL   HCT 24.0 (*) 39.0 - 52.0 %   MCV 85.4  78.0 - 100.0 fL   MCH 30.2  26.0 - 34.0 pg   MCHC 35.4  30.0 - 36.0 g/dL   RDW 15.3  11.5 - 15.5 %   Platelets 89 (*) 150 - 400 K/uL   Comment: CONSISTENT WITH PREVIOUS RESULT  GLUCOSE, CAPILLARY     Status: Abnormal   Collection Time    07/04/13  3:58 AM      Result Value Ref Range   Glucose-Capillary 157 (*) 70 - 99 mg/dL  CBC     Status: Abnormal   Collection Time    07/04/13  4:08 AM      Result Value Ref Range   WBC 18.8 (*) 4.0 - 10.5 K/uL   RBC 2.54 (*) 4.22 - 5.81 MIL/uL   Hemoglobin 7.7 (*) 13.0 - 17.0 g/dL   HCT 21.9 (*) 39.0 - 52.0 %   MCV 86.2  78.0 - 100.0 fL   MCH 30.3  26.0 - 34.0 pg   MCHC 35.2  30.0 - 36.0 g/dL   RDW 15.5  11.5 - 15.5 %   Platelets 84 (*) 150 - 400 K/uL   Comment: CONSISTENT WITH PREVIOUS RESULT  PHOSPHORUS     Status: None   Collection Time    07/04/13  4:08 AM      Result Value Ref Range   Phosphorus 3.3  2.3 - 4.6 mg/dL  MAGNESIUM     Status: None   Collection Time    07/04/13  4:08 AM      Result Value Ref Range    Magnesium 1.8  1.5 - 2.5 mg/dL  COMPREHENSIVE METABOLIC PANEL     Status: Abnormal   Collection Time    07/04/13  4:08 AM      Result Value Ref Range   Sodium 137  137 - 147 mEq/L   Potassium 3.2 (*) 3.7 - 5.3 mEq/L   Comment: DELTA CHECK NOTED   Chloride 108  96 - 112 mEq/L   CO2 16 (*) 19 - 32 mEq/L   Glucose, Bld 161 (*) 70 - 99 mg/dL   BUN 51 (*) 6 - 23 mg/dL   Creatinine, Ser 2.63 (*) 0.50 - 1.35 mg/dL   Calcium 6.7 (*) 8.4 - 10.5 mg/dL   Total Protein 3.3 (*) 6.0 - 8.3 g/dL   Albumin 1.4 (*) 3.5 - 5.2 g/dL   AST 15  0 - 37 U/L   ALT 8  0 - 53 U/L   Alkaline Phosphatase 60  39 - 117 U/L   Total Bilirubin 0.4  0.3 - 1.2 mg/dL   GFR calc non Af Amer 21 (*) >90 mL/min   GFR calc Af Amer 25 (*) >90 mL/min   Comment: (NOTE)     The eGFR has been calculated using the CKD EPI equation.     This calculation has not been validated in all clinical situations.     eGFR's persistently <90 mL/min signify possible Chronic Kidney     Disease.  POCT I-STAT 3, BLOOD GAS (G3+)     Status: Abnormal   Collection Time    07/04/13  4:09 AM      Result Value Ref Range   pH, Arterial 7.348 (*) 7.350 - 7.450   pCO2 arterial 28.9 (*) 35.0 - 45.0 mmHg   pO2, Arterial 144.0 (*) 80.0 - 100.0 mmHg   Bicarbonate 16.0 (*) 20.0 - 24.0 mEq/L   TCO2 17  0 - 100 mmol/L   O2 Saturation 99.0     Acid-base deficit 9.0 (*) 0.0 - 2.0 mmol/L   Patient temperature 98.0 F     Collection site ARTERIAL LINE     Drawn by RT     Sample type ARTERIAL    GLUCOSE, CAPILLARY     Status: Abnormal   Collection Time    07/04/13  8:14 AM      Result Value Ref Range   Glucose-Capillary 152 (*) 70 - 99 mg/dL  CBC     Status: Abnormal   Collection Time    07/04/13  8:30 AM      Result Value Ref Range   WBC 18.2 (*) 4.0 - 10.5 K/uL   Comment: CONSISTENT WITH PREVIOUS RESULT   RBC 2.59 (*) 4.22 - 5.81 MIL/uL   Hemoglobin 7.9 (*) 13.0 - 17.0 g/dL   Comment: CONSISTENT WITH PREVIOUS RESULT   HCT 22.2 (*) 39.0 -  52.0 %   MCV 85.7  78.0 - 100.0 fL   MCH 30.5  26.0 - 34.0 pg   MCHC 35.6  30.0 - 36.0 g/dL   RDW 15.6 (*) 11.5 - 15.5 %   Platelets 82 (*) 150 - 400 K/uL   Comment: CONSISTENT WITH PREVIOUS RESULT  POCT I-STAT 3, BLOOD GAS (G3+)     Status: Abnormal   Collection Time    07/04/13  9:04 AM      Result Value Ref Range   pH, Arterial 7.347 (*) 7.350 - 7.450   pCO2 arterial 31.1 (*) 35.0 - 45.0 mmHg   pO2, Arterial 158.0 (*) 80.0 - 100.0 mmHg   Bicarbonate 17.2 (*) 20.0 - 24.0 mEq/L   TCO2 18  0 - 100 mmol/L   O2 Saturation 99.0     Acid-base deficit 8.0 (*) 0.0 - 2.0 mmol/L   Patient temperature 97.7 F     Collection site FEMORAL ARTERY     Drawn by Operator     Sample type ARTERIAL    GLUCOSE, CAPILLARY     Status: Abnormal   Collection Time    07/04/13 11:40 AM      Result Value Ref Range   Glucose-Capillary 132 (*) 70 - 99 mg/dL    Ir Angiogram Visceral Selective  07/04/2013   CLINICAL DATA:  80 year old male with severe lower GI bleeding status post essentially negative upper and lower endoscopies. He did have a polypectomy of a polyp from the distal ascending colon this past week. Previous tagged red blood cell study is negative, however patient is now received over 4 units of packed red cells and has had 11 bloody bowel movements over the last several hrs. He is hypotensive requiring pressor support. Given the clinical evidence for active hemorrhage, the decision was made to proceed straight to angiography.  EXAM: SELECTIVE VISCERAL ARTERIOGRAPHY; ADDITIONAL ARTERIOGRAPHY; IR ULTRASOUND GUIDANCE VASC ACCESS RIGHT  Date: 07/04/2013  PROCEDURE: 1. Ultrasound-guided puncture of the right common femoral artery 2. Catheterization of the superior mesenteric artery 3. Superior mesenteric arteriogram 4. Catheterization of the  right colloid artery 5. Right colic arteriogram 6. Catheterization of a distal branch of the right colic artery with arteriogram 7. Catheterization of the middle colic  artery 8. Middle colic arteriogram 9. Catheterization of the common hepatic artery 10. Common hepatic arteriogram 11. Catheterization of the left gonadal artery with arteriogram Interventional Radiologist:  Criselda Peaches, MD  ANESTHESIA/SEDATION: Moderate (conscious) sedation was used. 3.5 mg mg Versed, 75 mcg Fentanyl were administered intravenously. The patient's vital signs were monitored continuously by radiology nursing throughout the procedure.  Sedation Time: 75 minutes  FLUOROSCOPY TIME:  26 minutes 6 seconds  CONTRAST:  149mL OMNIPAQUE IOHEXOL 300 MG/ML  SOLN  TECHNIQUE: Informed consent was obtained from the patient following explanation of the procedure, risks, benefits and alternatives. The patient understands, agrees and consents for the procedure. All questions were addressed. A time out was performed.  Maximal barrier sterile technique utilized including caps, mask, sterile gowns, sterile gloves, large sterile drape, hand hygiene, and a Betadine skin prep.  The right common femoral artery was interrogated with ultrasound. The vessel is patent. There is dense atherosclerotic calcification posteriorly. A relatively clear segment of vessel was selected. Local anesthesia was attained by infiltration with 1% lidocaine. A small dermatotomy was made. Using real-time sonographic guidance, the common femoral artery was punctured with a 21 gauge micropuncture needle. Using a transitional 5 French micro sheath, a Bentson wire was advanced into the abdominal aorta. The iliac artery is heavily diseased and likely mildly stenotic. A 5 [place] working sheath was then advanced over the [place] wire. A C2 Cobra catheter was advanced into the abdominal aorta in used to select the superior mesenteric artery.  A superior mesenteric arteriogram was performed. The vessel demonstrates scattered areas of atherosclerotic calcification. There is a short segment focal high-grade stenosis of the distal SMA beyond the  initial small bowel and middle colloid branches. There is a suggestion of abnormal contrast blush arising from branches of the right colloid artery. Therefore, a renegade STC micro catheter was advanced over a fat and wire into the right solid artery and an arteriogram was performed. There are several areas of a slightly abnormal parenchymal blush but no evidence of active extravasation. The micro catheter was advanced more distally into the supplying branch artery. A repeat arteriogram was performed. This was carried through the venous phase. There is a small focus of hyperemia in the colonic wall, however no early draining vein and no evidence of active hemorrhage. This may represent some mild reactive hyperemia related to the patient's recent polypectomy, however this is unlikely to be the source of the patient's severe bleeding. The decision was made not to embolize and to proceed with a more thorough angiographic search in an attempt to flying the site of bleeding.  The micro catheter was then used to select the right colic artery. A right colic arteriogram was performed. There is no significant vessel abnormality or evidence of active bleeding. The micro catheter was then removed. The C2 catheter was used to select the right common hepatic artery. A common hepatic arteriogram was performed. No evidence of the bleeding into the duodenum. The gastroduodenal artery is unremarkable. The pancreaticoduodenal branches are unremarkable. There is no significant collateral flow and no evidence of a of replaced or accessory middle colloid artery.  A cobra catheter was then exchanged for a RIM catheter. The RIM catheter was used to selected artery arising from the left anterolateral aspect of the aorta which was thought to be the inferior mesenteric  artery. However, upon closer review it is unclear if this is truly the inferior mesenteric artery or if this represents a prominent left gonadal artery with with an additional  branching pattern from collateral flow into a left lumbar artery which mimics the appearance of the inferior mesenteric artery. This possibility was not recognized at the time of angiography.  The catheters were removed. The right femoral sheath was left in place to serve as an arterial access for direct pressure measurements. This was flushed with saline, sutured to the skin and connected to a pressure monitor and drip.  The patient tolerated the procedure well. There was no immediate complication.  IMPRESSION: 1. Essentially negative arteriogram. There is a small focus of hyperemia in the ascending colon without evidence of active hemorrhage. This small area was not felt to represent the source of the patient's severe bleeding and therefore no embolization was performed. 2. After completely reviewing the images, I am not certain that the inferior mesenteric artery was adequately evaluated. The selected vessel which appeared to be the IMA during the initial angiogram may in fact represent a mimicking left gonadal vein with a left lower quadrant branching pattern created by collateral flow into left-sided lumbar arteries. If the patient has a recurrent arterial bleeding, repeat angiography could be considered. We could likely evaluate the inferior mesenteric artery more completely if there is concern for a a diverticular bleed arising from the left colon.  These findings and the plan were discussed with Dr. Amedeo Plenty of Gastroenterology by Dr. Laurence Ferrari at 10:30 a.m. on 07/04/2013.  Signed,  Criselda Peaches, MD  [facility]  [facility] Radiology   Electronically Signed   By: Jacqulynn Cadet M.D.   On: 07/04/2013 10:29   Ir Angiogram Visceral Selective  07/04/2013   CLINICAL DATA:  80 year old male with severe lower GI bleeding status post essentially negative upper and lower endoscopies. He did have a polypectomy of a polyp from the distal ascending colon this past week.  Previous tagged red blood cell study is negative, however patient is now received over 4 units of packed red cells and has had 11 bloody bowel movements over the last several hrs. He is hypotensive requiring pressor support. Given the clinical evidence for active hemorrhage, the decision was made to proceed straight to angiography.  EXAM: SELECTIVE VISCERAL ARTERIOGRAPHY; ADDITIONAL ARTERIOGRAPHY; IR ULTRASOUND GUIDANCE VASC ACCESS RIGHT  Date: 07/04/2013  PROCEDURE: 1. Ultrasound-guided puncture of the right common femoral artery 2. Catheterization of the superior mesenteric artery 3. Superior mesenteric arteriogram 4. Catheterization of the right colloid artery 5. Right colic arteriogram 6. Catheterization of a distal branch of the right colic artery with arteriogram 7. Catheterization of the middle colic artery 8. Middle colic arteriogram 9. Catheterization of the common hepatic artery 10. Common hepatic arteriogram 11. Catheterization of the left gonadal artery with arteriogram Interventional Radiologist:  Criselda Peaches, MD  ANESTHESIA/SEDATION: Moderate (conscious) sedation was used. 3.5 mg mg Versed, 75 mcg Fentanyl were administered intravenously. The patient's vital signs were monitored continuously by radiology nursing throughout the procedure.  Sedation Time: 75 minutes  FLUOROSCOPY TIME:  26 minutes 6 seconds  CONTRAST:  124mL OMNIPAQUE IOHEXOL 300 MG/ML  SOLN  TECHNIQUE: Informed consent was obtained from the patient following explanation of the procedure, risks, benefits and alternatives. The patient understands, agrees and consents for the procedure. All questions were addressed. A time out was performed.  Maximal barrier sterile technique utilized including caps, mask, sterile gowns, sterile gloves,  large sterile drape, hand hygiene, and a Betadine skin prep.  The right common femoral artery was interrogated with ultrasound. The vessel is patent. There is dense atherosclerotic calcification  posteriorly. A relatively clear segment of vessel was selected. Local anesthesia was attained by infiltration with 1% lidocaine. A small dermatotomy was made. Using real-time sonographic guidance, the common femoral artery was punctured with a 21 gauge micropuncture needle. Using a transitional 5 French micro sheath, a Bentson wire was advanced into the abdominal aorta. The iliac artery is heavily diseased and likely mildly stenotic. A 5 [place] working sheath was then advanced over the [place] wire. A C2 Cobra catheter was advanced into the abdominal aorta in used to select the superior mesenteric artery.  A superior mesenteric arteriogram was performed. The vessel demonstrates scattered areas of atherosclerotic calcification. There is a short segment focal high-grade stenosis of the distal SMA beyond the initial small bowel and middle colloid branches. There is a suggestion of abnormal contrast blush arising from branches of the right colloid artery. Therefore, a renegade STC micro catheter was advanced over a fat and wire into the right solid artery and an arteriogram was performed. There are several areas of a slightly abnormal parenchymal blush but no evidence of active extravasation. The micro catheter was advanced more distally into the supplying branch artery. A repeat arteriogram was performed. This was carried through the venous phase. There is a small focus of hyperemia in the colonic wall, however no early draining vein and no evidence of active hemorrhage. This may represent some mild reactive hyperemia related to the patient's recent polypectomy, however this is unlikely to be the source of the patient's severe bleeding. The decision was made not to embolize and to proceed with a more thorough angiographic search in an attempt to flying the site of bleeding.  The micro catheter was then used to select the right colic artery. A right colic arteriogram was performed. There is no significant vessel  abnormality or evidence of active bleeding. The micro catheter was then removed. The C2 catheter was used to select the right common hepatic artery. A common hepatic arteriogram was performed. No evidence of the bleeding into the duodenum. The gastroduodenal artery is unremarkable. The pancreaticoduodenal branches are unremarkable. There is no significant collateral flow and no evidence of a of replaced or accessory middle colloid artery.  A cobra catheter was then exchanged for a RIM catheter. The RIM catheter was used to selected artery arising from the left anterolateral aspect of the aorta which was thought to be the inferior mesenteric artery. However, upon closer review it is unclear if this is truly the inferior mesenteric artery or if this represents a prominent left gonadal artery with with an additional branching pattern from collateral flow into a left lumbar artery which mimics the appearance of the inferior mesenteric artery. This possibility was not recognized at the time of angiography.  The catheters were removed. The right femoral sheath was left in place to serve as an arterial access for direct pressure measurements. This was flushed with saline, sutured to the skin and connected to a pressure monitor and drip.  The patient tolerated the procedure well. There was no immediate complication.  IMPRESSION: 1. Essentially negative arteriogram. There is a small focus of hyperemia in the ascending colon without evidence of active hemorrhage. This small area was not felt to represent the source of the patient's severe bleeding and therefore no embolization was performed. 2. After completely reviewing the images, I  am not certain that the inferior mesenteric artery was adequately evaluated. The selected vessel which appeared to be the IMA during the initial angiogram may in fact represent a mimicking left gonadal vein with a left lower quadrant branching pattern created by collateral flow into left-sided  lumbar arteries. If the patient has a recurrent arterial bleeding, repeat angiography could be considered. We could likely evaluate the inferior mesenteric artery more completely if there is concern for a a diverticular bleed arising from the left colon.  These findings and the plan were discussed with Dr. Amedeo Plenty of Gastroenterology by Dr. Laurence Ferrari at 10:30 a.m. on 07/04/2013.  Signed,  Criselda Peaches, MD  [facility]  [facility] Radiology   Electronically Signed   By: Jacqulynn Cadet M.D.   On: 07/04/2013 10:29   Ir Angiogram Visceral Selective  07/04/2013   CLINICAL DATA:  80 year old male with severe lower GI bleeding status post essentially negative upper and lower endoscopies. He did have a polypectomy of a polyp from the distal ascending colon this past week. Previous tagged red blood cell study is negative, however patient is now received over 4 units of packed red cells and has had 11 bloody bowel movements over the last several hrs. He is hypotensive requiring pressor support. Given the clinical evidence for active hemorrhage, the decision was made to proceed straight to angiography.  EXAM: SELECTIVE VISCERAL ARTERIOGRAPHY; ADDITIONAL ARTERIOGRAPHY; IR ULTRASOUND GUIDANCE VASC ACCESS RIGHT  Date: 07/04/2013  PROCEDURE: 1. Ultrasound-guided puncture of the right common femoral artery 2. Catheterization of the superior mesenteric artery 3. Superior mesenteric arteriogram 4. Catheterization of the right colloid artery 5. Right colic arteriogram 6. Catheterization of a distal branch of the right colic artery with arteriogram 7. Catheterization of the middle colic artery 8. Middle colic arteriogram 9. Catheterization of the common hepatic artery 10. Common hepatic arteriogram 11. Catheterization of the left gonadal artery with arteriogram Interventional Radiologist:  Criselda Peaches, MD  ANESTHESIA/SEDATION: Moderate (conscious) sedation was used. 3.5 mg mg  Versed, 75 mcg Fentanyl were administered intravenously. The patient's vital signs were monitored continuously by radiology nursing throughout the procedure.  Sedation Time: 75 minutes  FLUOROSCOPY TIME:  26 minutes 6 seconds  CONTRAST:  121mL OMNIPAQUE IOHEXOL 300 MG/ML  SOLN  TECHNIQUE: Informed consent was obtained from the patient following explanation of the procedure, risks, benefits and alternatives. The patient understands, agrees and consents for the procedure. All questions were addressed. A time out was performed.  Maximal barrier sterile technique utilized including caps, mask, sterile gowns, sterile gloves, large sterile drape, hand hygiene, and a Betadine skin prep.  The right common femoral artery was interrogated with ultrasound. The vessel is patent. There is dense atherosclerotic calcification posteriorly. A relatively clear segment of vessel was selected. Local anesthesia was attained by infiltration with 1% lidocaine. A small dermatotomy was made. Using real-time sonographic guidance, the common femoral artery was punctured with a 21 gauge micropuncture needle. Using a transitional 5 French micro sheath, a Bentson wire was advanced into the abdominal aorta. The iliac artery is heavily diseased and likely mildly stenotic. A 5 [place] working sheath was then advanced over the [place] wire. A C2 Cobra catheter was advanced into the abdominal aorta in used to select the superior mesenteric artery.  A superior mesenteric arteriogram was performed. The vessel demonstrates scattered areas of atherosclerotic calcification. There is a short segment focal high-grade stenosis of the distal SMA beyond the initial small bowel and middle colloid branches.  There is a suggestion of abnormal contrast blush arising from branches of the right colloid artery. Therefore, a renegade STC micro catheter was advanced over a fat and wire into the right solid artery and an arteriogram was performed. There are several areas  of a slightly abnormal parenchymal blush but no evidence of active extravasation. The micro catheter was advanced more distally into the supplying branch artery. A repeat arteriogram was performed. This was carried through the venous phase. There is a small focus of hyperemia in the colonic wall, however no early draining vein and no evidence of active hemorrhage. This may represent some mild reactive hyperemia related to the patient's recent polypectomy, however this is unlikely to be the source of the patient's severe bleeding. The decision was made not to embolize and to proceed with a more thorough angiographic search in an attempt to flying the site of bleeding.  The micro catheter was then used to select the right colic artery. A right colic arteriogram was performed. There is no significant vessel abnormality or evidence of active bleeding. The micro catheter was then removed. The C2 catheter was used to select the right common hepatic artery. A common hepatic arteriogram was performed. No evidence of the bleeding into the duodenum. The gastroduodenal artery is unremarkable. The pancreaticoduodenal branches are unremarkable. There is no significant collateral flow and no evidence of a of replaced or accessory middle colloid artery.  A cobra catheter was then exchanged for a RIM catheter. The RIM catheter was used to selected artery arising from the left anterolateral aspect of the aorta which was thought to be the inferior mesenteric artery. However, upon closer review it is unclear if this is truly the inferior mesenteric artery or if this represents a prominent left gonadal artery with with an additional branching pattern from collateral flow into a left lumbar artery which mimics the appearance of the inferior mesenteric artery. This possibility was not recognized at the time of angiography.  The catheters were removed. The right femoral sheath was left in place to serve as an arterial access for direct  pressure measurements. This was flushed with saline, sutured to the skin and connected to a pressure monitor and drip.  The patient tolerated the procedure well. There was no immediate complication.  IMPRESSION: 1. Essentially negative arteriogram. There is a small focus of hyperemia in the ascending colon without evidence of active hemorrhage. This small area was not felt to represent the source of the patient's severe bleeding and therefore no embolization was performed. 2. After completely reviewing the images, I am not certain that the inferior mesenteric artery was adequately evaluated. The selected vessel which appeared to be the IMA during the initial angiogram may in fact represent a mimicking left gonadal vein with a left lower quadrant branching pattern created by collateral flow into left-sided lumbar arteries. If the patient has a recurrent arterial bleeding, repeat angiography could be considered. We could likely evaluate the inferior mesenteric artery more completely if there is concern for a a diverticular bleed arising from the left colon.  These findings and the plan were discussed with Dr. Amedeo Plenty of Gastroenterology by Dr. Laurence Ferrari at 10:30 a.m. on 07/04/2013.  Signed,  Criselda Peaches, MD  [facility]  [facility] Radiology   Electronically Signed   By: Jacqulynn Cadet M.D.   On: 07/04/2013 10:29   Ir Angiogram Selective Each Additional Vessel  07/04/2013   CLINICAL DATA:  80 year old male with severe lower GI bleeding  status post essentially negative upper and lower endoscopies. He did have a polypectomy of a polyp from the distal ascending colon this past week. Previous tagged red blood cell study is negative, however patient is now received over 4 units of packed red cells and has had 11 bloody bowel movements over the last several hrs. He is hypotensive requiring pressor support. Given the clinical evidence for active hemorrhage, the decision was  made to proceed straight to angiography.  EXAM: SELECTIVE VISCERAL ARTERIOGRAPHY; ADDITIONAL ARTERIOGRAPHY; IR ULTRASOUND GUIDANCE VASC ACCESS RIGHT  Date: 07/04/2013  PROCEDURE: 1. Ultrasound-guided puncture of the right common femoral artery 2. Catheterization of the superior mesenteric artery 3. Superior mesenteric arteriogram 4. Catheterization of the right colloid artery 5. Right colic arteriogram 6. Catheterization of a distal branch of the right colic artery with arteriogram 7. Catheterization of the middle colic artery 8. Middle colic arteriogram 9. Catheterization of the common hepatic artery 10. Common hepatic arteriogram 11. Catheterization of the left gonadal artery with arteriogram Interventional Radiologist:  Criselda Peaches, MD  ANESTHESIA/SEDATION: Moderate (conscious) sedation was used. 3.5 mg mg Versed, 75 mcg Fentanyl were administered intravenously. The patient's vital signs were monitored continuously by radiology nursing throughout the procedure.  Sedation Time: 75 minutes  FLUOROSCOPY TIME:  26 minutes 6 seconds  CONTRAST:  172mL OMNIPAQUE IOHEXOL 300 MG/ML  SOLN  TECHNIQUE: Informed consent was obtained from the patient following explanation of the procedure, risks, benefits and alternatives. The patient understands, agrees and consents for the procedure. All questions were addressed. A time out was performed.  Maximal barrier sterile technique utilized including caps, mask, sterile gowns, sterile gloves, large sterile drape, hand hygiene, and a Betadine skin prep.  The right common femoral artery was interrogated with ultrasound. The vessel is patent. There is dense atherosclerotic calcification posteriorly. A relatively clear segment of vessel was selected. Local anesthesia was attained by infiltration with 1% lidocaine. A small dermatotomy was made. Using real-time sonographic guidance, the common femoral artery was punctured with a 21 gauge micropuncture needle. Using a transitional 5  French micro sheath, a Bentson wire was advanced into the abdominal aorta. The iliac artery is heavily diseased and likely mildly stenotic. A 5 [place] working sheath was then advanced over the [place] wire. A C2 Cobra catheter was advanced into the abdominal aorta in used to select the superior mesenteric artery.  A superior mesenteric arteriogram was performed. The vessel demonstrates scattered areas of atherosclerotic calcification. There is a short segment focal high-grade stenosis of the distal SMA beyond the initial small bowel and middle colloid branches. There is a suggestion of abnormal contrast blush arising from branches of the right colloid artery. Therefore, a renegade STC micro catheter was advanced over a fat and wire into the right solid artery and an arteriogram was performed. There are several areas of a slightly abnormal parenchymal blush but no evidence of active extravasation. The micro catheter was advanced more distally into the supplying branch artery. A repeat arteriogram was performed. This was carried through the venous phase. There is a small focus of hyperemia in the colonic wall, however no early draining vein and no evidence of active hemorrhage. This may represent some mild reactive hyperemia related to the patient's recent polypectomy, however this is unlikely to be the source of the patient's severe bleeding. The decision was made not to embolize and to proceed with a more thorough angiographic search in an attempt to flying the site of bleeding.  The micro catheter was then  used to select the right colic artery. A right colic arteriogram was performed. There is no significant vessel abnormality or evidence of active bleeding. The micro catheter was then removed. The C2 catheter was used to select the right common hepatic artery. A common hepatic arteriogram was performed. No evidence of the bleeding into the duodenum. The gastroduodenal artery is unremarkable. The  pancreaticoduodenal branches are unremarkable. There is no significant collateral flow and no evidence of a of replaced or accessory middle colloid artery.  A cobra catheter was then exchanged for a RIM catheter. The RIM catheter was used to selected artery arising from the left anterolateral aspect of the aorta which was thought to be the inferior mesenteric artery. However, upon closer review it is unclear if this is truly the inferior mesenteric artery or if this represents a prominent left gonadal artery with with an additional branching pattern from collateral flow into a left lumbar artery which mimics the appearance of the inferior mesenteric artery. This possibility was not recognized at the time of angiography.  The catheters were removed. The right femoral sheath was left in place to serve as an arterial access for direct pressure measurements. This was flushed with saline, sutured to the skin and connected to a pressure monitor and drip.  The patient tolerated the procedure well. There was no immediate complication.  IMPRESSION: 1. Essentially negative arteriogram. There is a small focus of hyperemia in the ascending colon without evidence of active hemorrhage. This small area was not felt to represent the source of the patient's severe bleeding and therefore no embolization was performed. 2. After completely reviewing the images, I am not certain that the inferior mesenteric artery was adequately evaluated. The selected vessel which appeared to be the IMA during the initial angiogram may in fact represent a mimicking left gonadal vein with a left lower quadrant branching pattern created by collateral flow into left-sided lumbar arteries. If the patient has a recurrent arterial bleeding, repeat angiography could be considered. We could likely evaluate the inferior mesenteric artery more completely if there is concern for a a diverticular bleed arising from the left colon.  These findings and the plan were  discussed with Dr. Amedeo Plenty of Gastroenterology by Dr. Laurence Ferrari at 10:30 a.m. on 07/04/2013.  Signed,  Criselda Peaches, MD  [facility]  [facility]   Electronically Signed   By: Jacqulynn Cadet M.D.   On: 07/04/2013 10:29   Ir Angiogram Selective Each Additional Vessel  07/04/2013   CLINICAL DATA:  80 year old male with severe lower GI bleeding status post essentially negative upper and lower endoscopies. He did have a polypectomy of a polyp from the distal ascending colon this past week. Previous tagged red blood cell study is negative, however patient is now received over 4 units of packed red cells and has had 11 bloody bowel movements over the last several hrs. He is hypotensive requiring pressor support. Given the clinical evidence for active hemorrhage, the decision was made to proceed straight to angiography.  EXAM: SELECTIVE VISCERAL ARTERIOGRAPHY; ADDITIONAL ARTERIOGRAPHY; IR ULTRASOUND GUIDANCE VASC ACCESS RIGHT  Date: 07/04/2013  PROCEDURE: 1. Ultrasound-guided puncture of the right common femoral artery 2. Catheterization of the superior mesenteric artery 3. Superior mesenteric arteriogram 4. Catheterization of the right colloid artery 5. Right colic arteriogram 6. Catheterization of a distal branch of the right colic artery with arteriogram 7. Catheterization of the middle colic artery 8. Middle colic arteriogram 9. Catheterization of the common hepatic artery  10. Common hepatic arteriogram 11. Catheterization of the left gonadal artery with arteriogram Interventional Radiologist:  Criselda Peaches, MD  ANESTHESIA/SEDATION: Moderate (conscious) sedation was used. 3.5 mg mg Versed, 75 mcg Fentanyl were administered intravenously. The patient's vital signs were monitored continuously by radiology nursing throughout the procedure.  Sedation Time: 75 minutes  FLUOROSCOPY TIME:  26 minutes 6 seconds  CONTRAST:  143mL OMNIPAQUE IOHEXOL 300 MG/ML  SOLN   TECHNIQUE: Informed consent was obtained from the patient following explanation of the procedure, risks, benefits and alternatives. The patient understands, agrees and consents for the procedure. All questions were addressed. A time out was performed.  Maximal barrier sterile technique utilized including caps, mask, sterile gowns, sterile gloves, large sterile drape, hand hygiene, and a Betadine skin prep.  The right common femoral artery was interrogated with ultrasound. The vessel is patent. There is dense atherosclerotic calcification posteriorly. A relatively clear segment of vessel was selected. Local anesthesia was attained by infiltration with 1% lidocaine. A small dermatotomy was made. Using real-time sonographic guidance, the common femoral artery was punctured with a 21 gauge micropuncture needle. Using a transitional 5 French micro sheath, a Bentson wire was advanced into the abdominal aorta. The iliac artery is heavily diseased and likely mildly stenotic. A 5 [place] working sheath was then advanced over the [place] wire. A C2 Cobra catheter was advanced into the abdominal aorta in used to select the superior mesenteric artery.  A superior mesenteric arteriogram was performed. The vessel demonstrates scattered areas of atherosclerotic calcification. There is a short segment focal high-grade stenosis of the distal SMA beyond the initial small bowel and middle colloid branches. There is a suggestion of abnormal contrast blush arising from branches of the right colloid artery. Therefore, a renegade STC micro catheter was advanced over a fat and wire into the right solid artery and an arteriogram was performed. There are several areas of a slightly abnormal parenchymal blush but no evidence of active extravasation. The micro catheter was advanced more distally into the supplying branch artery. A repeat arteriogram was performed. This was carried through the venous phase. There is a small focus of hyperemia in  the colonic wall, however no early draining vein and no evidence of active hemorrhage. This may represent some mild reactive hyperemia related to the patient's recent polypectomy, however this is unlikely to be the source of the patient's severe bleeding. The decision was made not to embolize and to proceed with a more thorough angiographic search in an attempt to flying the site of bleeding.  The micro catheter was then used to select the right colic artery. A right colic arteriogram was performed. There is no significant vessel abnormality or evidence of active bleeding. The micro catheter was then removed. The C2 catheter was used to select the right common hepatic artery. A common hepatic arteriogram was performed. No evidence of the bleeding into the duodenum. The gastroduodenal artery is unremarkable. The pancreaticoduodenal branches are unremarkable. There is no significant collateral flow and no evidence of a of replaced or accessory middle colloid artery.  A cobra catheter was then exchanged for a RIM catheter. The RIM catheter was used to selected artery arising from the left anterolateral aspect of the aorta which was thought to be the inferior mesenteric artery. However, upon closer review it is unclear if this is truly the inferior mesenteric artery or if this represents a prominent left gonadal artery with with an additional branching pattern from collateral flow into a left lumbar  artery which mimics the appearance of the inferior mesenteric artery. This possibility was not recognized at the time of angiography.  The catheters were removed. The right femoral sheath was left in place to serve as an arterial access for direct pressure measurements. This was flushed with saline, sutured to the skin and connected to a pressure monitor and drip.  The patient tolerated the procedure well. There was no immediate complication.  IMPRESSION: 1. Essentially negative arteriogram. There is a small focus of  hyperemia in the ascending colon without evidence of active hemorrhage. This small area was not felt to represent the source of the patient's severe bleeding and therefore no embolization was performed. 2. After completely reviewing the images, I am not certain that the inferior mesenteric artery was adequately evaluated. The selected vessel which appeared to be the IMA during the initial angiogram may in fact represent a mimicking left gonadal vein with a left lower quadrant branching pattern created by collateral flow into left-sided lumbar arteries. If the patient has a recurrent arterial bleeding, repeat angiography could be considered. We could likely evaluate the inferior mesenteric artery more completely if there is concern for a a diverticular bleed arising from the left colon.  These findings and the plan were discussed with Dr. Amedeo Plenty of Gastroenterology by Dr. Laurence Ferrari at 10:30 a.m. on 07/04/2013.  Signed,  Criselda Peaches, MD  [facility]  [facility]   Electronically Signed   By: Jacqulynn Cadet M.D.   On: 07/04/2013 10:29   Ir Angiogram Selective Each Additional Vessel  07/04/2013   CLINICAL DATA:  80 year old male with severe lower GI bleeding status post essentially negative upper and lower endoscopies. He did have a polypectomy of a polyp from the distal ascending colon this past week. Previous tagged red blood cell study is negative, however patient is now received over 4 units of packed red cells and has had 11 bloody bowel movements over the last several hrs. He is hypotensive requiring pressor support. Given the clinical evidence for active hemorrhage, the decision was made to proceed straight to angiography.  EXAM: SELECTIVE VISCERAL ARTERIOGRAPHY; ADDITIONAL ARTERIOGRAPHY; IR ULTRASOUND GUIDANCE VASC ACCESS RIGHT  Date: 07/04/2013  PROCEDURE: 1. Ultrasound-guided puncture of the right common femoral artery 2. Catheterization of the superior  mesenteric artery 3. Superior mesenteric arteriogram 4. Catheterization of the right colloid artery 5. Right colic arteriogram 6. Catheterization of a distal branch of the right colic artery with arteriogram 7. Catheterization of the middle colic artery 8. Middle colic arteriogram 9. Catheterization of the common hepatic artery 10. Common hepatic arteriogram 11. Catheterization of the left gonadal artery with arteriogram Interventional Radiologist:  Criselda Peaches, MD  ANESTHESIA/SEDATION: Moderate (conscious) sedation was used. 3.5 mg mg Versed, 75 mcg Fentanyl were administered intravenously. The patient's vital signs were monitored continuously by radiology nursing throughout the procedure.  Sedation Time: 75 minutes  FLUOROSCOPY TIME:  26 minutes 6 seconds  CONTRAST:  13mL OMNIPAQUE IOHEXOL 300 MG/ML  SOLN  TECHNIQUE: Informed consent was obtained from the patient following explanation of the procedure, risks, benefits and alternatives. The patient understands, agrees and consents for the procedure. All questions were addressed. A time out was performed.  Maximal barrier sterile technique utilized including caps, mask, sterile gowns, sterile gloves, large sterile drape, hand hygiene, and a Betadine skin prep.  The right common femoral artery was interrogated with ultrasound. The vessel is patent. There is dense atherosclerotic calcification posteriorly. A relatively clear segment of vessel  was selected. Local anesthesia was attained by infiltration with 1% lidocaine. A small dermatotomy was made. Using real-time sonographic guidance, the common femoral artery was punctured with a 21 gauge micropuncture needle. Using a transitional 5 French micro sheath, a Bentson wire was advanced into the abdominal aorta. The iliac artery is heavily diseased and likely mildly stenotic. A 5 [place] working sheath was then advanced over the [place] wire. A C2 Cobra catheter was advanced into the abdominal aorta in used to  select the superior mesenteric artery.  A superior mesenteric arteriogram was performed. The vessel demonstrates scattered areas of atherosclerotic calcification. There is a short segment focal high-grade stenosis of the distal SMA beyond the initial small bowel and middle colloid branches. There is a suggestion of abnormal contrast blush arising from branches of the right colloid artery. Therefore, a renegade STC micro catheter was advanced over a fat and wire into the right solid artery and an arteriogram was performed. There are several areas of a slightly abnormal parenchymal blush but no evidence of active extravasation. The micro catheter was advanced more distally into the supplying branch artery. A repeat arteriogram was performed. This was carried through the venous phase. There is a small focus of hyperemia in the colonic wall, however no early draining vein and no evidence of active hemorrhage. This may represent some mild reactive hyperemia related to the patient's recent polypectomy, however this is unlikely to be the source of the patient's severe bleeding. The decision was made not to embolize and to proceed with a more thorough angiographic search in an attempt to flying the site of bleeding.  The micro catheter was then used to select the right colic artery. A right colic arteriogram was performed. There is no significant vessel abnormality or evidence of active bleeding. The micro catheter was then removed. The C2 catheter was used to select the right common hepatic artery. A common hepatic arteriogram was performed. No evidence of the bleeding into the duodenum. The gastroduodenal artery is unremarkable. The pancreaticoduodenal branches are unremarkable. There is no significant collateral flow and no evidence of a of replaced or accessory middle colloid artery.  A cobra catheter was then exchanged for a RIM catheter. The RIM catheter was used to selected artery arising from the left anterolateral  aspect of the aorta which was thought to be the inferior mesenteric artery. However, upon closer review it is unclear if this is truly the inferior mesenteric artery or if this represents a prominent left gonadal artery with with an additional branching pattern from collateral flow into a left lumbar artery which mimics the appearance of the inferior mesenteric artery. This possibility was not recognized at the time of angiography.  The catheters were removed. The right femoral sheath was left in place to serve as an arterial access for direct pressure measurements. This was flushed with saline, sutured to the skin and connected to a pressure monitor and drip.  The patient tolerated the procedure well. There was no immediate complication.  IMPRESSION: 1. Essentially negative arteriogram. There is a small focus of hyperemia in the ascending colon without evidence of active hemorrhage. This small area was not felt to represent the source of the patient's severe bleeding and therefore no embolization was performed. 2. After completely reviewing the images, I am not certain that the inferior mesenteric artery was adequately evaluated. The selected vessel which appeared to be the IMA during the initial angiogram may in fact represent a mimicking left gonadal vein with a left  lower quadrant branching pattern created by collateral flow into left-sided lumbar arteries. If the patient has a recurrent arterial bleeding, repeat angiography could be considered. We could likely evaluate the inferior mesenteric artery more completely if there is concern for a a diverticular bleed arising from the left colon.  These findings and the plan were discussed with Dr. Amedeo Plenty of Gastroenterology by Dr. Laurence Ferrari at 10:30 a.m. on 07/04/2013.  Signed,  Criselda Peaches, MD  [facility]  [facility]   Electronically Signed   By: Jacqulynn Cadet M.D.   On: 07/04/2013 10:29   US Aorta  Port  07/03/2013   CLINICAL DATA:  Aortic aneurysm, hypertension  EXAM: ULTRASOUND OF ABDOMINAL AORTA  TECHNIQUE: Ultrasound examination of the abdominal aorta was performed to evaluate for abdominal aortic aneurysm.  COMPARISON:  Arteriogram from earlier the same day, and prior studies  FINDINGS: Abdominal Aorta  There is a fusiform distal abdominal aortic aneurysm.  Maximum AP  Diameter:  3.5 cm  Maximum TRV  Diameter: 3.9 cm (previously 3.5 cm on 06/16/2012 by my measurement)  Common iliac arteries are dilated, right 28 mm diameter, left 24 mm.  IMPRESSION: 1. Slight enlargement of 3.9 cm fusiform abdominal aortic aneurysm and aneurysmal common iliac arteries as above. Recommend follow up by [place] in 2years. This recommendation follows ACR consensus guidelines: White Paper of the ACR Incidental Findings Committee II on Vascular Findings. Joellyn Rued [id number]; [id number].   Electronically Signed   By: Arne Cleveland M.D.   On: 07/03/2013 10:46   Ir US Guide Vasc Access Right  07/04/2013   CLINICAL DATA:  80 year old male with severe lower GI bleeding status post essentially negative upper and lower endoscopies. He did have a polypectomy of a polyp from the distal ascending colon this past week. Previous tagged red blood cell study is negative, however patient is now received over 4 units of packed red cells and has had 11 bloody bowel movements over the last several hrs. He is hypotensive requiring pressor support. Given the clinical evidence for active hemorrhage, the decision was made to proceed straight to angiography.  EXAM: SELECTIVE VISCERAL ARTERIOGRAPHY; ADDITIONAL ARTERIOGRAPHY; IR ULTRASOUND GUIDANCE VASC ACCESS RIGHT  Date: 07/04/2013  PROCEDURE: 1. Ultrasound-guided puncture of the right common femoral artery 2. Catheterization of the superior mesenteric artery 3. Superior mesenteric arteriogram 4. Catheterization of the right colloid artery 5. Right colic arteriogram 6. Catheterization of a distal  branch of the right colic artery with arteriogram 7. Catheterization of the middle colic artery 8. Middle colic arteriogram 9. Catheterization of the common hepatic artery 10. Common hepatic arteriogram 11. Catheterization of the left gonadal artery with arteriogram Interventional Radiologist:  Criselda Peaches, MD  ANESTHESIA/SEDATION: Moderate (conscious) sedation was used. 3.5 mg mg Versed, 75 mcg Fentanyl were administered intravenously. The patient's vital signs were monitored continuously by radiology nursing throughout the procedure.  Sedation Time: 75 minutes  FLUOROSCOPY TIME:  26 minutes 6 seconds  CONTRAST:  178mL OMNIPAQUE IOHEXOL 300 MG/ML  SOLN  TECHNIQUE: Informed consent was obtained from the patient following explanation of the procedure, risks, benefits and alternatives. The patient understands, agrees and consents for the procedure. All questions were addressed. A time out was performed.  Maximal barrier sterile technique utilized including caps, mask, sterile gowns, sterile gloves, large sterile drape, hand hygiene, and a Betadine skin prep.  The right common femoral artery was interrogated with ultrasound. The vessel is patent. There is dense atherosclerotic calcification posteriorly. A  relatively clear segment of vessel was selected. Local anesthesia was attained by infiltration with 1% lidocaine. A small dermatotomy was made. Using real-time sonographic guidance, the common femoral artery was punctured with a 21 gauge micropuncture needle. Using a transitional 5 French micro sheath, a Bentson wire was advanced into the abdominal aorta. The iliac artery is heavily diseased and likely mildly stenotic. A 5 [place] working sheath was then advanced over the [employer] wire. A C2 Cobra catheter was advanced into the abdominal aorta in used to select the superior mesenteric artery.  A superior mesenteric arteriogram was performed. The vessel demonstrates scattered areas of atherosclerotic  calcification. There is a short segment focal high-grade stenosis of the distal SMA beyond the initial small bowel and middle colloid branches. There is a suggestion of abnormal contrast blush arising from branches of the right colloid artery. Therefore, a renegade STC micro catheter was advanced over a fat and wire into the right solid artery and an arteriogram was performed. There are several areas of a slightly abnormal parenchymal blush but no evidence of active extravasation. The micro catheter was advanced more distally into the supplying branch artery. A repeat arteriogram was performed. This was carried through the venous phase. There is a small focus of hyperemia in the colonic wall, however no early draining vein and no evidence of active hemorrhage. This may represent some mild reactive hyperemia related to the patient's recent polypectomy, however this is unlikely to be the source of the patient's severe bleeding. The decision was made not to embolize and to proceed with a more thorough angiographic search in an attempt to flying the site of bleeding.  The micro catheter was then used to select the right colic artery. A right colic arteriogram was performed. There is no significant vessel abnormality or evidence of active bleeding. The micro catheter was then removed. The C2 catheter was used to select the right common hepatic artery. A common hepatic arteriogram was performed. No evidence of the bleeding into the duodenum. The gastroduodenal artery is unremarkable. The pancreaticoduodenal branches are unremarkable. There is no significant collateral flow and no evidence of a of replaced or accessory middle colloid artery.  A cobra catheter was then exchanged for a RIM catheter. The RIM catheter was used to selected artery arising from the left anterolateral aspect of the aorta which was thought to be the inferior mesenteric artery. However, upon closer review it is unclear if this is truly the inferior  mesenteric artery or if this represents a prominent left gonadal artery with with an additional branching pattern from collateral flow into a left lumbar artery which mimics the appearance of the inferior mesenteric artery. This possibility was not recognized at the time of angiography.  The catheters were removed. The right femoral sheath was left in place to serve as an arterial access for direct pressure measurements. This was flushed with saline, sutured to the skin and connected to a pressure monitor and drip.  The patient tolerated the procedure well. There was no immediate complication.  IMPRESSION: 1. Essentially negative arteriogram. There is a small focus of hyperemia in the ascending colon without evidence of active hemorrhage. This small area was not felt to represent the source of the patient's severe bleeding and therefore no embolization was performed. 2. After completely reviewing the images, I am not certain that the inferior mesenteric artery was adequately evaluated. The selected vessel which appeared to be the IMA during the initial angiogram may in fact represent a mimicking left  gonadal vein with a left lower quadrant branching pattern created by collateral flow into left-sided lumbar arteries. If the patient has a recurrent arterial bleeding, repeat angiography could be considered. We could likely evaluate the inferior mesenteric artery more completely if there is concern for a a diverticular bleed arising from the left colon.  These findings and the plan were discussed with Dr. Amedeo Plenty of Gastroenterology by Dr. Laurence Ferrari at 10:30 a.m. on 07/04/2013.  Signed,  Criselda Peaches, MD  [facility]  [facility]iology   Electronically Signed   By: Jacqulynn Cadet M.D.   On: 07/04/2013 10:29   Dg Chest Port 1 View  07/04/2013   CLINICAL DATA:  Evaluate endotracheal tube position  EXAM: PORTABLE CHEST - 1 VIEW  COMPARISON:  Portable chest x-ray of  07/03/2013  FINDINGS: The tip of the endotracheal tube is approximately 7.8 cm above the carina. Haziness at both lung bases most likely represents effusions and atelectasis. Mild cardiomegaly is stable. The left central venous line tip overlies the mid SVC.  IMPRESSION: 1. Endotracheal tube tip 7.8 cm above the carina. 2. Little change in probable small effusions and basilar atelectasis.   Electronically Signed   By: Ivar Drape M.D.   On: 07/04/2013 08:12   Dg Chest Port 1 View  07/03/2013   CLINICAL DATA:  Intubation.  EXAM: PORTABLE CHEST - 1 VIEW  COMPARISON:  DG CHEST 1V PORT dated 07/01/2013  FINDINGS: Endotracheal tube noted with its tip approximately 6 cm above the carina. Left IJ tube noted with tip projected over superior vena cava. Cardiomegaly, normal pulmonary vascularity. Bilateral basilar interstitial lung infiltrates with Kerley B-lines are noted. Left-sided pleural effusion. These findings are consistent with congestive heart failure with pulmonary edema. Prior CABG. No pneumothorax or acute bony abnormality.  IMPRESSION: 1. Endotracheal tube and left IJ line in good anatomic position. 2. Findings suggesting congestive heart failure with interstitial edema and small left pleural effusion.   Electronically Signed   By: Marcello Moores  Register   On: 07/03/2013 01:56   Dg Abd Portable 1v  07/03/2013   CLINICAL DATA:  Nasogastric tube placement.  EXAM: PORTABLE ABDOMEN - 1 VIEW  COMPARISON:  US AORTA PORT dated 07/03/2013; DG BE dated 11/16/2006; CT ABDOMEN W/O CM dated 10/01/2006  FINDINGS: Prior median sternotomy, CABG, and left lower thoracic stent. Small left pleural effusion.  Nasogastric tube side port is in the gastric cardia with distal tip in the gastric body.  Thoracolumbar spondylosis.  IMPRESSION: 1. Nasogastric tube side port in the gastric cardia with tip in the gastric body. 2. Small left pleural effusion.   Electronically Signed   By: Sherryl Barters M.D.   On: 07/03/2013 12:01     Assessment/Plan 1 atrial fibrillation-the patient has permanent atrial fibrillation. His rate has been elevated in the setting of holding his beta blocker and acute GI bleed. His blood pressure has now improved. Would follow heart rate and resume low-dose metoprolol as needed. If blood pressure is an issue amiodarone could be used to control heart rate. Given active GI bleed would not resume Coumadin at this point. Once it is clear his GI bleeding has stopped we could resume his Coumadin at that point (? 4-6 weeks). I explained the risk of an embolic event to he and his family off of Coumadin but we have no other choice at this point. 2 H/O TAVR 3 renal insufficiency-follow renal function. 4 coronary artery disease-resume statin at discharge. We will follow.  Kirk Ruths MD 07/04/2013, 1:09 PM

## 2013-07-04 NOTE — Progress Notes (Signed)
GI addendum: Capsule endoscopy done, no bleeding source seen in the small bowel and no old blood in the terminal ileum. Capsule entered the proximal colon where there was a mixture of old chyme and fecal residue with very little residual blood. The capsule did not appear to move very far down the colon but no fresh blood or clots were seen. There were a few small adherent flecks of blood that could conceivably have represented a polypectomy site but this was not clear and overall the appearance was nonspecific.  I'm hoping the appearance of the distal ileum and cecum is an indication that bleeding was from the colon and has stopped or at least cleared out of the proximal cecum.  I also spoke with interventional radiology and they feel that the inferior mesenteric artery may not have been adequately visualized during the first arteriogram. [name] they recommended considering a repeat arteriogram possibly with a pooled RBC scan first, under the assumption that his bleeding was most likely from left-sided diverticulosis which appears to be the case. Will begin clear liquids and continue to follow.

## 2013-07-04 NOTE — Progress Notes (Signed)
eLink Physician-Brief Progress Note Patient Name: EDRIAN MELUCCI DOB: 1931/02/07 MRN: [id number]  Date of Service  07/04/2013   HPI/Events of Note   Cortisol high On clears  eICU Interventions  Dc hydrocort CBGs ac & hs   Intervention Category Major Interventions: Hyperglycemia - active titration of insulin therapy;Adrenal insufficiency - evaluation and management  Eshaal Duby V. 07/04/2013, 5:12 PM

## 2013-07-05 ENCOUNTER — Encounter (HOSPITAL_COMMUNITY): Payer: Medicare Other

## 2013-07-05 ENCOUNTER — Inpatient Hospital Stay (HOSPITAL_COMMUNITY): Payer: Medicare Other

## 2013-07-05 LAB — TYPE AND SCREEN
ABO/RH(D): O POS
Antibody Screen: NEGATIVE
UNIT DIVISION: 0
UNIT DIVISION: 0
UNIT DIVISION: 0
Unit division: 0
Unit division: 0
Unit division: 0
Unit division: 0
Unit division: 0
Unit division: 0
Unit division: 0
Unit division: 0
Unit division: 0
Unit division: 0

## 2013-07-05 LAB — CBC WITH DIFFERENTIAL/PLATELET
BASOS ABS: 0 10*3/uL (ref 0.0–0.1)
Basophils Relative: 0 % (ref 0–1)
EOS ABS: 0 10*3/uL (ref 0.0–0.7)
EOS PCT: 0 % (ref 0–5)
HCT: 21.1 % — ABNORMAL LOW (ref 39.0–52.0)
HEMOGLOBIN: 7.4 g/dL — AB (ref 13.0–17.0)
Lymphocytes Relative: 4 % — ABNORMAL LOW (ref 12–46)
Lymphs Abs: 0.7 10*3/uL (ref 0.7–4.0)
MCH: 30.8 pg (ref 26.0–34.0)
MCHC: 35.1 g/dL (ref 30.0–36.0)
MCV: 87.9 fL (ref 78.0–100.0)
MONOS PCT: 8 % (ref 3–12)
Monocytes Absolute: 1.3 10*3/uL — ABNORMAL HIGH (ref 0.1–1.0)
Neutro Abs: 14.9 10*3/uL — ABNORMAL HIGH (ref 1.7–7.7)
Neutrophils Relative %: 88 % — ABNORMAL HIGH (ref 43–77)
Platelets: 86 10*3/uL — ABNORMAL LOW (ref 150–400)
RBC: 2.4 MIL/uL — ABNORMAL LOW (ref 4.22–5.81)
RDW: 17 % — AB (ref 11.5–15.5)
WBC: 16.9 10*3/uL — ABNORMAL HIGH (ref 4.0–10.5)

## 2013-07-05 LAB — BASIC METABOLIC PANEL
BUN: 50 mg/dL — ABNORMAL HIGH (ref 6–23)
CALCIUM: 7.5 mg/dL — AB (ref 8.4–10.5)
CO2: 17 meq/L — AB (ref 19–32)
CREATININE: 2.59 mg/dL — AB (ref 0.50–1.35)
Chloride: 106 mEq/L (ref 96–112)
GFR calc Af Amer: 25 mL/min — ABNORMAL LOW (ref >90)
GFR, EST NON AFRICAN AMERICAN: 22 mL/min — AB (ref >90)
Glucose, Bld: 135 mg/dL — ABNORMAL HIGH (ref 70–99)
Potassium: 3.6 mEq/L — ABNORMAL LOW (ref 3.7–5.3)
Sodium: 135 mEq/L — ABNORMAL LOW (ref 137–147)

## 2013-07-05 LAB — GLUCOSE, CAPILLARY
Glucose-Capillary: 102 mg/dL — ABNORMAL HIGH (ref 70–99)
Glucose-Capillary: 104 mg/dL — ABNORMAL HIGH (ref 70–99)
Glucose-Capillary: 107 mg/dL — ABNORMAL HIGH (ref 70–99)
Glucose-Capillary: 113 mg/dL — ABNORMAL HIGH (ref 70–99)
Glucose-Capillary: 144 mg/dL — ABNORMAL HIGH (ref 70–99)
Glucose-Capillary: 150 mg/dL — ABNORMAL HIGH (ref 70–99)
Glucose-Capillary: 99 mg/dL (ref 70–99)

## 2013-07-05 LAB — CBC
HCT: 21.2 % — ABNORMAL LOW (ref 39.0–52.0)
HEMATOCRIT: 21.3 % — AB (ref 39.0–52.0)
HEMOGLOBIN: 7.4 g/dL — AB (ref 13.0–17.0)
Hemoglobin: 7.4 g/dL — ABNORMAL LOW (ref 13.0–17.0)
MCH: 30.3 pg (ref 26.0–34.0)
MCH: 30.7 pg (ref 26.0–34.0)
MCHC: 34.7 g/dL (ref 30.0–36.0)
MCHC: 34.9 g/dL (ref 30.0–36.0)
MCV: 87.3 fL (ref 78.0–100.0)
MCV: 88 fL (ref 78.0–100.0)
PLATELETS: 94 10*3/uL — AB (ref 150–400)
Platelets: 90 10*3/uL — ABNORMAL LOW (ref 150–400)
RBC: 2.41 MIL/uL — ABNORMAL LOW (ref 4.22–5.81)
RBC: 2.44 MIL/uL — ABNORMAL LOW (ref 4.22–5.81)
RDW: 16.6 % — ABNORMAL HIGH (ref 11.5–15.5)
RDW: 16.7 % — ABNORMAL HIGH (ref 11.5–15.5)
WBC: 16.2 10*3/uL — ABNORMAL HIGH (ref 4.0–10.5)
WBC: 16.6 10*3/uL — ABNORMAL HIGH (ref 4.0–10.5)

## 2013-07-05 LAB — URINE CULTURE
COLONY COUNT: NO GROWTH
Culture: NO GROWTH

## 2013-07-05 LAB — MAGNESIUM: Magnesium: 1.8 mg/dL (ref 1.5–2.5)

## 2013-07-05 LAB — PHOSPHORUS: Phosphorus: 4 mg/dL (ref 2.3–4.6)

## 2013-07-05 MED ORDER — DIGOXIN 0.25 MG/ML IJ SOLN
0.2500 mg | Freq: Once | INTRAMUSCULAR | Status: AC
  Administered 2013-07-05: 0.25 mg via INTRAVENOUS
  Filled 2013-07-05: qty 1

## 2013-07-05 MED ORDER — METOPROLOL TARTRATE 25 MG PO TABS
25.0000 mg | ORAL_TABLET | Freq: Two times a day (BID) | ORAL | Status: DC
  Administered 2013-07-06 – 2013-07-08 (×5): 25 mg via ORAL
  Filled 2013-07-05 (×7): qty 1

## 2013-07-05 MED ORDER — CIPROFLOXACIN HCL 500 MG PO TABS
500.0000 mg | ORAL_TABLET | Freq: Every day | ORAL | Status: DC
  Administered 2013-07-05 – 2013-07-06 (×2): 500 mg via ORAL
  Filled 2013-07-05 (×3): qty 1

## 2013-07-05 NOTE — Progress Notes (Signed)
NUTRITION FOLLOW UP  DOCUMENTATION CODES  Per approved criteria   -Non-severe (moderate) malnutrition in the context of acute illness or injury    Intervention:    Diet advancement to soft diet per MD.   If intake is not adequate, will supplement diet as needed with snacks and/or PO supplements.  Nutrition Dx:   Inadequate oral intake related to altered GI function as evidenced by diet just advanced to solid foods.  Goal:   Intake to meet >90% of estimated nutrition needs. Unmet. Progressing.  Monitor:   PO intake, labs, weight trend.  Assessment:   Patient with PMH of PVD, HTN, A-fib, CKD, AAA and colon cancer; presented with 10 day hx of black stools and fatigue; evaluated by PCP and had CBC showing Hg of 6.3 -- asked to come to ER. Diagnosed with moderate malnutrition in the context of acute illness on admission. Underwent upper and lower endoscopies as above without sign of bleed. Also s/p polypectomnt Then on 07/01/13: had futher bleed but BRPR this time with bp/hr instability.  Transferred to the MICU and intubated on 3/14. Extubated on 3/17. Per GI note, his duodenal polyp was a benign adenoma without high-grade dysplasia, and his colon polyp was also a benign adenoma without high-grade dysplasia. Capsule endoscopy was negative for small bowel source of bleeding. NGT has been removed. Patient has been receiving clear liquid diet, advancing to soft diet with lunch today. Patient says he is hungry, ready to try solid foods. Declines supplements.  Height: Ht Readings from Last 1 Encounters:  07/01/13 5\' 8"  (1.727 m)    Weight Status:   Wt Readings from Last 1 Encounters:  06/27/13 166 lb 3.6 oz (75.4 kg)    Re-estimated needs:  Kcal: 1800-2000 Protein: 90-110 gm Fluid: 1.8-2 L  Skin: right groin incision  Diet Order: Soft diet   Intake/Output Summary (Last 24 hours) at 07/05/13 1139 Last data filed at 07/05/13 1000  Gross per 24 hour  Intake 4294.17 ml  Output    1185 ml  Net 3109.17 ml    Last BM: 3/18   Labs:   Recent Labs Lab 07/03/13 0500 07/04/13 0408 07/05/13 0500  NA 137 137 135*  K 3.9 3.2* 3.6*  CL 111 108 106  CO2 12* 16* 17*  BUN 56* 51* 50*  CREATININE 2.43* 2.63* 2.59*  CALCIUM 6.5* 6.7* 7.5*  MG 1.8 1.8 1.8  PHOS 4.2 3.3 4.0  GLUCOSE 188* 161* 135*    CBG (last 3)   Recent Labs  07/04/13 2352 07/05/13 0403 07/05/13 0758  GLUCAP 150* 144* 113*    Scheduled Meds: . antiseptic oral rinse  15 mL Mouth Rinse q12n4p  . cefTAZidime (FORTAZ)  IV  1 g Intravenous Q12H  . chlorhexidine  15 mL Mouth Rinse BID  . fentaNYL  200 mcg Intravenous Once  . insulin aspart  0-15 Units Subcutaneous TID WC  . pantoprazole (PROTONIX) IV  40 mg Intravenous Q1200  . polyethylene glycol  17 g Oral BID  . vancomycin  750 mg Intravenous Q24H    Continuous Infusions: . sodium chloride    . sodium chloride    . fentaNYL infusion INTRAVENOUS Stopped (07/04/13 0915)  .  sodium bicarbonate  infusion 1000 mL 50 mL/hr at 07/04/13 [place], RD, LDN, [place] Pager [phone] After Hours Pager [phone]

## 2013-07-05 NOTE — Progress Notes (Signed)
Patient ID: [name], male   DOB: 11-Apr-1931, 80 y.o.   MRN: [id number]  Subjective: BM last night.  Tolerated clears.    Objective:  Vital signs:  Filed Vitals:   07/05/13 0300 07/05/13 0400 07/05/13 0500 07/05/13 0600  BP:  108/45    Pulse: 117 115 105 51  Temp:  97.7 F (36.5 C)    TempSrc:  Oral    Resp: $Remo'17 17 26 12  'UDYyk$ Height:      Weight:      SpO2: 96% 97% 97% 100%    Last BM Date: 07/03/13  Intake/Output   Yesterday:  03/17 0701 - 03/18 0700 In: 4040.4 [I.V.:2230.4; NG/GT:1500; IV Piggyback:250] Out: 3716 [Urine:1220] This shift:    I/O last 3 completed shifts: In: 5349.2 [I.V.:3439.2; Other:60; NG/GT:1500; IV Piggyback:350] Out: 9678 [Urine:1825]    Physical Exam: General: Pt awake/alert/oriented x3 in no acute distress Abdomen: Soft.  Nondistended.  nontender.  No evidence of peritonitis.  No incarcerated hernias.   Problem List:   Principal Problem:   Anemia, blood loss Active Problems:   CKD (chronic kidney disease)   Aortic valve disorder   HTN (hypertension)   Atrial fibrillation   Coronary atherosclerosis of native coronary artery   Upper GI bleed   Malnutrition of moderate degree   Acute on chronic renal failure   Gastrointestinal hemorrhage   Shock circulatory    Results:   Labs: Results for orders placed during the hospital encounter of 06/26/13 (from the past 48 hour(s))  APTT     Status: None   Collection Time    07/03/13  8:30 AM      Result Value Ref Range   aPTT 33  24 - 37 seconds  PROTIME-INR     Status: Abnormal   Collection Time    07/03/13  8:30 AM      Result Value Ref Range   Prothrombin Time 17.2 (*) 11.6 - 15.2 seconds   INR 1.44  0.00 - 1.49  POCT I-STAT 3, BLOOD GAS (G3+)     Status: Abnormal   Collection Time    07/03/13  9:13 AM      Result Value Ref Range   pH, Arterial 7.293 (*) 7.350 - 7.450   pCO2 arterial 29.8 (*) 35.0 - 45.0 mmHg   pO2, Arterial 143.0 (*) 80.0 - 100.0 mmHg   Bicarbonate 14.5  (*) 20.0 - 24.0 mEq/L   TCO2 15  0 - 100 mmol/L   O2 Saturation 99.0     Acid-base deficit 11.0 (*) 0.0 - 2.0 mmol/L   Patient temperature 98.1 F     Collection site ARTERIAL LINE     Drawn by RT     Sample type ARTERIAL    URINALYSIS, ROUTINE W REFLEX MICROSCOPIC     Status: Abnormal   Collection Time    07/03/13  9:32 AM      Result Value Ref Range   Color, Urine YELLOW  YELLOW   APPearance CLOUDY (*) CLEAR   Specific Gravity, Urine 1.021  1.005 - 1.030   pH 6.0  5.0 - 8.0   Glucose, UA NEGATIVE  NEGATIVE mg/dL   Hgb urine dipstick LARGE (*) NEGATIVE   Bilirubin Urine NEGATIVE  NEGATIVE   Ketones, ur NEGATIVE  NEGATIVE mg/dL   Protein, ur 30 (*) NEGATIVE mg/dL   Urobilinogen, UA 0.2  0.0 - 1.0 mg/dL   Nitrite NEGATIVE  NEGATIVE   Leukocytes, UA MODERATE (*) NEGATIVE  URINE MICROSCOPIC-ADD ON  Status: Abnormal   Collection Time    07/03/13  9:32 AM      Result Value Ref Range   Squamous Epithelial / LPF FEW (*) RARE   WBC, UA TOO NUMEROUS TO COUNT  <3 WBC/hpf   RBC / HPF 11-20  <3 RBC/hpf   Bacteria, UA MANY (*) RARE  CULTURE, BLOOD (ROUTINE X 2)     Status: None   Collection Time    07/03/13  9:35 AM      Result Value Ref Range   Specimen Description BLOOD RIGHT HAND     Special Requests       Value: BOTTLES DRAWN AEROBIC AND ANAEROBIC 10CC BLUE, 5CC RED   Culture  Setup Time       Value: 07/03/2013 14:00     Performed at [facility]   Culture       Value:        BLOOD CULTURE RECEIVED NO GROWTH TO DATE CULTURE WILL BE HELD FOR 5 DAYS BEFORE ISSUING A FINAL NEGATIVE REPORT     Performed at [facility]   Report Status PENDING    CULTURE, BLOOD (ROUTINE X 2)     Status: None   Collection Time    07/03/13  9:52 AM      Result Value Ref Range   Specimen Description BLOOD RIGHT HAND     Special Requests BOTTLES DRAWN AEROBIC ONLY 10CC     Culture  Setup Time       Value: 07/03/2013 14:00     Performed at [facility]   Culture        Value:        BLOOD CULTURE RECEIVED NO GROWTH TO DATE CULTURE WILL BE HELD FOR 5 DAYS BEFORE ISSUING A FINAL NEGATIVE REPORT     Performed at [facility]   Report Status PENDING    CBC     Status: Abnormal   Collection Time    07/03/13 12:08 PM      Result Value Ref Range   WBC 20.2 (*) 4.0 - 10.5 K/uL   RBC 2.99 (*) 4.22 - 5.81 MIL/uL   Hemoglobin 8.9 (*) 13.0 - 17.0 g/dL   HCT 25.4 (*) 39.0 - 52.0 %   MCV 84.9  78.0 - 100.0 fL   MCH 29.8  26.0 - 34.0 pg   MCHC 35.0  30.0 - 36.0 g/dL   RDW 14.8  11.5 - 15.5 %   Platelets 89 (*) 150 - 400 K/uL   Comment: SPECIMEN CHECKED FOR CLOTS     REPEATED TO VERIFY     CONSISTENT WITH PREVIOUS RESULT  GLUCOSE, CAPILLARY     Status: Abnormal   Collection Time    07/03/13 12:13 PM      Result Value Ref Range   Glucose-Capillary 248 (*) 70 - 99 mg/dL  LACTIC ACID, PLASMA     Status: None   Collection Time    07/03/13 12:36 PM      Result Value Ref Range   Lactic Acid, Venous 0.9  0.5 - 2.2 mmol/L  TROPONIN I     Status: None   Collection Time    07/03/13 12:36 PM      Result Value Ref Range   Troponin I <0.30  <0.30 ng/mL   Comment:            Due to the release kinetics of cTnI,     a negative result within the first  hours     of the onset of symptoms does not rule out     myocardial infarction with certainty.     If myocardial infarction is still suspected,     repeat the test at appropriate intervals.  CBC     Status: Abnormal   Collection Time    07/03/13  4:08 PM      Result Value Ref Range   WBC 21.7 (*) 4.0 - 10.5 K/uL   RBC 2.92 (*) 4.22 - 5.81 MIL/uL   Hemoglobin 9.0 (*) 13.0 - 17.0 g/dL   HCT 24.9 (*) 39.0 - 52.0 %   MCV 85.3  78.0 - 100.0 fL   MCH 30.8  26.0 - 34.0 pg   MCHC 36.1 (*) 30.0 - 36.0 g/dL   RDW 15.1  11.5 - 15.5 %   Platelets 95 (*) 150 - 400 K/uL   Comment: CONSISTENT WITH PREVIOUS RESULT  GLUCOSE, CAPILLARY     Status: Abnormal   Collection Time    07/03/13  6:11 PM      Result Value Ref  Range   Glucose-Capillary 209 (*) 70 - 99 mg/dL  CBC     Status: Abnormal   Collection Time    07/03/13  8:08 PM      Result Value Ref Range   WBC 22.1 (*) 4.0 - 10.5 K/uL   RBC 2.89 (*) 4.22 - 5.81 MIL/uL   Hemoglobin 8.8 (*) 13.0 - 17.0 g/dL   HCT 24.6 (*) 39.0 - 52.0 %   MCV 85.1  78.0 - 100.0 fL   MCH 30.4  26.0 - 34.0 pg   MCHC 35.8  30.0 - 36.0 g/dL   RDW 15.2  11.5 - 15.5 %   Platelets 98 (*) 150 - 400 K/uL   Comment: CONSISTENT WITH PREVIOUS RESULT  GLUCOSE, CAPILLARY     Status: Abnormal   Collection Time    07/03/13  8:16 PM      Result Value Ref Range   Glucose-Capillary 213 (*) 70 - 99 mg/dL  GLUCOSE, CAPILLARY     Status: Abnormal   Collection Time    07/03/13 11:49 PM      Result Value Ref Range   Glucose-Capillary 209 (*) 70 - 99 mg/dL  CBC     Status: Abnormal   Collection Time    07/04/13 12:08 AM      Result Value Ref Range   WBC 22.3 (*) 4.0 - 10.5 K/uL   RBC 2.81 (*) 4.22 - 5.81 MIL/uL   Hemoglobin 8.5 (*) 13.0 - 17.0 g/dL   HCT 24.0 (*) 39.0 - 52.0 %   MCV 85.4  78.0 - 100.0 fL   MCH 30.2  26.0 - 34.0 pg   MCHC 35.4  30.0 - 36.0 g/dL   RDW 15.3  11.5 - 15.5 %   Platelets 89 (*) 150 - 400 K/uL   Comment: CONSISTENT WITH PREVIOUS RESULT  GLUCOSE, CAPILLARY     Status: Abnormal   Collection Time    07/04/13  3:58 AM      Result Value Ref Range   Glucose-Capillary 157 (*) 70 - 99 mg/dL  CBC     Status: Abnormal   Collection Time    07/04/13  4:08 AM      Result Value Ref Range   WBC 18.8 (*) 4.0 - 10.5 K/uL   RBC 2.54 (*) 4.22 - 5.81 MIL/uL   Hemoglobin 7.7 (*) 13.0 - 17.0 g/dL   HCT 21.9 (*)  39.0 - 52.0 %   MCV 86.2  78.0 - 100.0 fL   MCH 30.3  26.0 - 34.0 pg   MCHC 35.2  30.0 - 36.0 g/dL   RDW 15.5  11.5 - 15.5 %   Platelets 84 (*) 150 - 400 K/uL   Comment: CONSISTENT WITH PREVIOUS RESULT  PHOSPHORUS     Status: None   Collection Time    07/04/13  4:08 AM      Result Value Ref Range   Phosphorus 3.3  2.3 - 4.6 mg/dL  MAGNESIUM      Status: None   Collection Time    07/04/13  4:08 AM      Result Value Ref Range   Magnesium 1.8  1.5 - 2.5 mg/dL  COMPREHENSIVE METABOLIC PANEL     Status: Abnormal   Collection Time    07/04/13  4:08 AM      Result Value Ref Range   Sodium 137  137 - 147 mEq/L   Potassium 3.2 (*) 3.7 - 5.3 mEq/L   Comment: DELTA CHECK NOTED   Chloride 108  96 - 112 mEq/L   CO2 16 (*) 19 - 32 mEq/L   Glucose, Bld 161 (*) 70 - 99 mg/dL   BUN 51 (*) 6 - 23 mg/dL   Creatinine, Ser 2.63 (*) 0.50 - 1.35 mg/dL   Calcium 6.7 (*) 8.4 - 10.5 mg/dL   Total Protein 3.3 (*) 6.0 - 8.3 g/dL   Albumin 1.4 (*) 3.5 - 5.2 g/dL   AST 15  0 - 37 U/L   ALT 8  0 - 53 U/L   Alkaline Phosphatase 60  39 - 117 U/L   Total Bilirubin 0.4  0.3 - 1.2 mg/dL   GFR calc non Af Amer 21 (*) >90 mL/min   GFR calc Af Amer 25 (*) >90 mL/min   Comment: (NOTE)     The eGFR has been calculated using the CKD EPI equation.     This calculation has not been validated in all clinical situations.     eGFR's persistently <90 mL/min signify possible Chronic Kidney     Disease.  POCT I-STAT 3, BLOOD GAS (G3+)     Status: Abnormal   Collection Time    07/04/13  4:09 AM      Result Value Ref Range   pH, Arterial 7.348 (*) 7.350 - 7.450   pCO2 arterial 28.9 (*) 35.0 - 45.0 mmHg   pO2, Arterial 144.0 (*) 80.0 - 100.0 mmHg   Bicarbonate 16.0 (*) 20.0 - 24.0 mEq/L   TCO2 17  0 - 100 mmol/L   O2 Saturation 99.0     Acid-base deficit 9.0 (*) 0.0 - 2.0 mmol/L   Patient temperature 98.0 F     Collection site ARTERIAL LINE     Drawn by RT     Sample type ARTERIAL    GLUCOSE, CAPILLARY     Status: Abnormal   Collection Time    07/04/13  8:14 AM      Result Value Ref Range   Glucose-Capillary 152 (*) 70 - 99 mg/dL  CBC     Status: Abnormal   Collection Time    07/04/13  8:30 AM      Result Value Ref Range   WBC 18.2 (*) 4.0 - 10.5 K/uL   Comment: CONSISTENT WITH PREVIOUS RESULT   RBC 2.59 (*) 4.22 - 5.81 MIL/uL   Hemoglobin 7.9 (*) 13.0  - 17.0 g/dL   Comment: CONSISTENT  WITH PREVIOUS RESULT   HCT 22.2 (*) 39.0 - 52.0 %   MCV 85.7  78.0 - 100.0 fL   MCH 30.5  26.0 - 34.0 pg   MCHC 35.6  30.0 - 36.0 g/dL   RDW 15.6 (*) 11.5 - 15.5 %   Platelets 82 (*) 150 - 400 K/uL   Comment: CONSISTENT WITH PREVIOUS RESULT  POCT I-STAT 3, BLOOD GAS (G3+)     Status: Abnormal   Collection Time    07/04/13  9:04 AM      Result Value Ref Range   pH, Arterial 7.347 (*) 7.350 - 7.450   pCO2 arterial 31.1 (*) 35.0 - 45.0 mmHg   pO2, Arterial 158.0 (*) 80.0 - 100.0 mmHg   Bicarbonate 17.2 (*) 20.0 - 24.0 mEq/L   TCO2 18  0 - 100 mmol/L   O2 Saturation 99.0     Acid-base deficit 8.0 (*) 0.0 - 2.0 mmol/L   Patient temperature 97.7 F     Collection site FEMORAL ARTERY     Drawn by Operator     Sample type ARTERIAL    GLUCOSE, CAPILLARY     Status: Abnormal   Collection Time    07/04/13 11:40 AM      Result Value Ref Range   Glucose-Capillary 132 (*) 70 - 99 mg/dL  GLUCOSE, CAPILLARY     Status: Abnormal   Collection Time    07/04/13  3:26 PM      Result Value Ref Range   Glucose-Capillary 139 (*) 70 - 99 mg/dL  CBC     Status: Abnormal   Collection Time    07/04/13  4:17 PM      Result Value Ref Range   WBC 18.1 (*) 4.0 - 10.5 K/uL   RBC 2.53 (*) 4.22 - 5.81 MIL/uL   Hemoglobin 7.8 (*) 13.0 - 17.0 g/dL   HCT 21.9 (*) 39.0 - 52.0 %   MCV 86.6  78.0 - 100.0 fL   MCH 30.8  26.0 - 34.0 pg   MCHC 35.6  30.0 - 36.0 g/dL   RDW 16.1 (*) 11.5 - 15.5 %   Platelets 83 (*) 150 - 400 K/uL   Comment: CONSISTENT WITH PREVIOUS RESULT  GLUCOSE, CAPILLARY     Status: Abnormal   Collection Time    07/04/13  7:15 PM      Result Value Ref Range   Glucose-Capillary 120 (*) 70 - 99 mg/dL  GLUCOSE, CAPILLARY     Status: Abnormal   Collection Time    07/04/13 11:52 PM      Result Value Ref Range   Glucose-Capillary 150 (*) 70 - 99 mg/dL   Comment 1 Notify RN    CBC     Status: Abnormal   Collection Time    07/05/13 12:08 AM      Result  Value Ref Range   WBC 16.2 (*) 4.0 - 10.5 K/uL   RBC 2.44 (*) 4.22 - 5.81 MIL/uL   Hemoglobin 7.4 (*) 13.0 - 17.0 g/dL   HCT 21.3 (*) 39.0 - 52.0 %   MCV 87.3  78.0 - 100.0 fL   MCH 30.3  26.0 - 34.0 pg   MCHC 34.7  30.0 - 36.0 g/dL   RDW 16.6 (*) 11.5 - 15.5 %   Platelets 90 (*) 150 - 400 K/uL   Comment: CONSISTENT WITH PREVIOUS RESULT  GLUCOSE, CAPILLARY     Status: Abnormal   Collection Time    07/05/13  4:03  AM      Result Value Ref Range   Glucose-Capillary 144 (*) 70 - 99 mg/dL   Comment 1 Notify RN    BASIC METABOLIC PANEL     Status: Abnormal   Collection Time    07/05/13  5:00 AM      Result Value Ref Range   Sodium 135 (*) 137 - 147 mEq/L   Potassium 3.6 (*) 3.7 - 5.3 mEq/L   Chloride 106  96 - 112 mEq/L   CO2 17 (*) 19 - 32 mEq/L   Glucose, Bld 135 (*) 70 - 99 mg/dL   BUN 50 (*) 6 - 23 mg/dL   Creatinine, Ser 2.59 (*) 0.50 - 1.35 mg/dL   Calcium 7.5 (*) 8.4 - 10.5 mg/dL   GFR calc non Af Amer 22 (*) >90 mL/min   GFR calc Af Amer 25 (*) >90 mL/min   Comment: (NOTE)     The eGFR has been calculated using the CKD EPI equation.     This calculation has not been validated in all clinical situations.     eGFR's persistently <90 mL/min signify possible Chronic Kidney     Disease.  PHOSPHORUS     Status: None   Collection Time    07/05/13  5:00 AM      Result Value Ref Range   Phosphorus 4.0  2.3 - 4.6 mg/dL  MAGNESIUM     Status: None   Collection Time    07/05/13  5:00 AM      Result Value Ref Range   Magnesium 1.8  1.5 - 2.5 mg/dL  CBC WITH DIFFERENTIAL     Status: Abnormal   Collection Time    07/05/13  5:00 AM      Result Value Ref Range   WBC 16.9 (*) 4.0 - 10.5 K/uL   RBC 2.40 (*) 4.22 - 5.81 MIL/uL   Hemoglobin 7.4 (*) 13.0 - 17.0 g/dL   HCT 21.1 (*) 39.0 - 52.0 %   MCV 87.9  78.0 - 100.0 fL   MCH 30.8  26.0 - 34.0 pg   MCHC 35.1  30.0 - 36.0 g/dL   RDW 17.0 (*) 11.5 - 15.5 %   Platelets 86 (*) 150 - 400 K/uL   Comment: CONSISTENT WITH PREVIOUS  RESULT   Neutrophils Relative % 88 (*) 43 - 77 %   Neutro Abs 14.9 (*) 1.7 - 7.7 K/uL   Lymphocytes Relative 4 (*) 12 - 46 %   Lymphs Abs 0.7  0.7 - 4.0 K/uL   Monocytes Relative 8  3 - 12 %   Monocytes Absolute 1.3 (*) 0.1 - 1.0 K/uL   Eosinophils Relative 0  0 - 5 %   Eosinophils Absolute 0.0  0.0 - 0.7 K/uL   Basophils Relative 0  0 - 1 %   Basophils Absolute 0.0  0.0 - 0.1 K/uL    Imaging / Studies: US Aorta Port  07/03/2013   CLINICAL DATA:  Aortic aneurysm, hypertension  EXAM: ULTRASOUND OF ABDOMINAL AORTA  TECHNIQUE: Ultrasound examination of the abdominal aorta was performed to evaluate for abdominal aortic aneurysm.  COMPARISON:  Arteriogram from earlier the same day, and prior studies  FINDINGS: Abdominal Aorta  There is a fusiform distal abdominal aortic aneurysm.  Maximum AP  Diameter:  3.5 cm  Maximum TRV  Diameter: 3.9 cm (previously 3.5 cm on 06/16/2012 by my measurement)  Common iliac arteries are dilated, right 28 mm diameter, left 24 mm.  IMPRESSION: 1.  Slight enlargement of 3.9 cm fusiform abdominal aortic aneurysm and aneurysmal common iliac arteries as above. Recommend follow up by [place] in 2years. This recommendation follows ACR consensus guidelines: White Paper of the ACR Incidental Findings Committee II on Vascular Findings. Joellyn Rued [id number]; 85:[id number].   Electronically Signed   By: Arne Cleveland M.D.   On: 07/03/2013 10:46   Dg Chest Port 1 View  07/05/2013   CLINICAL DATA:  Follow-up atelectasis.  EXAM: PORTABLE CHEST - 1 VIEW  COMPARISON:  None.  FINDINGS: Endotracheal tube removed. Gastric tube extends into the stomach. Central venous catheter in the SVC.  Progression of bibasilar atelectasis and small effusions. Increased left upper lobe atelectasis. Question mild fluid overload. Mild vascular congestion is present.  IMPRESSION: Progression of bibasilar atelectasis and small effusions. Question mild fluid overload   Electronically Signed   By: Franchot Gallo M.D.    On: 07/05/2013 07:30   Dg Chest Port 1 View  07/04/2013   CLINICAL DATA:  Evaluate endotracheal tube position  EXAM: PORTABLE CHEST - 1 VIEW  COMPARISON:  Portable chest x-ray of 07/03/2013  FINDINGS: The tip of the endotracheal tube is approximately 7.8 cm above the carina. Haziness at both lung bases most likely represents effusions and atelectasis. Mild cardiomegaly is stable. The left central venous line tip overlies the mid SVC.  IMPRESSION: 1. Endotracheal tube tip 7.8 cm above the carina. 2. Little change in probable small effusions and basilar atelectasis.   Electronically Signed   By: Ivar Drape M.D.   On: 07/04/2013 08:12   Dg Abd Portable 1v  07/03/2013   CLINICAL DATA:  Nasogastric tube placement.  EXAM: PORTABLE ABDOMEN - 1 VIEW  COMPARISON:  US AORTA PORT dated 07/03/2013; DG BE dated 11/16/2006; CT ABDOMEN W/O CM dated 10/01/2006  FINDINGS: Prior median sternotomy, CABG, and left lower thoracic stent. Small left pleural effusion.  Nasogastric tube side port is in the gastric cardia with distal tip in the gastric body.  Thoracolumbar spondylosis.  IMPRESSION: 1. Nasogastric tube side port in the gastric cardia with tip in the gastric body. 2. Small left pleural effusion.   Electronically Signed   By: Sherryl Barters M.D.   On: 07/03/2013 12:01    Medications / Allergies: per chart  Antibiotics: Anti-infectives   Start     Dose/Rate Route Frequency Ordered Stop   07/04/13 0800  vancomycin (VANCOCIN) IVPB 750 mg/150 ml premix     750 mg 150 mL/hr over 60 Minutes Intravenous Every 24 hours 07/03/13 0814     07/03/13 0900  cefTAZidime (FORTAZ) 1 g in dextrose 5 % 50 mL IVPB     1 g 100 mL/hr over 30 Minutes Intravenous Every 12 hours 07/03/13 0814     07/03/13 0815  vancomycin (VANCOCIN) IVPB 1000 mg/200 mL premix     1,000 mg 200 mL/hr over 60 Minutes Intravenous  Once 07/03/13 0814 07/03/13 7412   07/03/13 0204  ceFAZolin (ANCEF) 2-3 GM-% IVPB SOLR    Comments:  Thelma Barge   :  cabinet override      07/03/13 0204 07/03/13 0313      Assessment/Plan ABL anemia GI bleed DC NGT Advance diet per GI Should he begin to bleed, recommend tagged RBC scan or IR angio to localize the bleeding.  No surgical intervention indicated at this time.  Monitor h&h. Surgery will follow  Erby Pian, [facility] Surgery Pager [phone] Office [phone]  07/05/2013 8:25 AM

## 2013-07-05 NOTE — Progress Notes (Signed)
Hungry.  D/C NGT and feed.  Looks stable.

## 2013-07-05 NOTE — Progress Notes (Addendum)
PULMONARY  / CRITICAL CARE MEDICINE  Name: [name] MRN: [id number] DOB: 1930/05/11 PCP Horton Finer, MD    ADMISSION DATE:  06/26/2013  LOS 9 days  CONSULTATION DATE:  07/01/13  REFERRING MD :  Dr Delfina Redwood  PRIMARY SERVICE: Sadie Haber -> PCCM  CHIEF COMPLAINT:  LGI bleed with hemodynamic instability  BRIEF PATIENT DESCRIPTION: 80 year old male with hx of polyps but refused screening scopies. Also A Fib and bioprosthetic aortic coumadin.s/p neo bladder early 45s Admitted 06/26/2013 wih 10 day shx of melena in setting of high INR 4. Underwent upper and lower endoscopies as above without sign of bleed. Also s/p polypectomnt Then on 07/01/13: had futher bleed but BRPR this time with bp/hr instability.  SIGNIFICANT EVENTS / STUDIES:  06/26/2013 - admit 3./11/15 - ugi scope - normal 06/30/13 - Colonsocpsy - ascending colon polypectomy. Scattered diverticulosis. Hemorrhoids. No active bleed 07/01/13 - Move tO ICU. Nuclear medicine scan negative for bleed. Foley inserted by Dr Risa Grill 3/15 angio neg, ett 3/16- remains on pressors, further bloody stools noted, 4 units prbc  3/16- colo- blood throughout colon, capsule endo deployed, HBG improved 3/16 [place] ABDo for AAA>>Slight enlargement of 3.9 cm fusiform abdominal aortic aneurysm<BR>and aneurysmal common iliac arteries as above 3/17 - extubated 3/18- result capsule endoscopy>>>neg  LINES / TUBES: CVL 07/01/13 - Foley (Dr Risa Grill) 07/01/13 3/16 ett>>>3/17  CULTURES: none  ANTIBIOTICS: Anti-infectives   Start     Dose/Rate Route Frequency Ordered Stop   07/04/13 0800  vancomycin (VANCOCIN) IVPB 750 mg/150 ml premix     750 mg 150 mL/hr over 60 Minutes Intravenous Every 24 hours 07/03/13 0814     07/03/13 0900  cefTAZidime (FORTAZ) 1 g in dextrose 5 % 50 mL IVPB     1 g 100 mL/hr over 30 Minutes Intravenous Every 12 hours 07/03/13 0814     07/03/13 0815  vancomycin (VANCOCIN) IVPB 1000 mg/200 mL premix     1,000 mg 200 mL/hr  over 60 Minutes Intravenous  Once 07/03/13 0814 07/03/13 0937   07/03/13 0204  ceFAZolin (ANCEF) 2-3 GM-% IVPB SOLR    Comments:  Thelma Barge   : cabinet override      07/03/13 0204 07/03/13 0313    \   SUBJECTIVE:  Awake no distress  VITAL SIGNS: Filed Vitals:   07/05/13 0854 07/05/13 0900 07/05/13 1000 07/05/13 1306  BP:      Pulse:  106 60   Temp: 98 F (36.7 C)   98.7 F (37.1 C)  TempSrc: Oral   Oral  Resp:  15 17   Height:      Weight:      SpO2:  100% 100%     VENTILATOR SETTINGS:  INTAKE / OUTPUT: I/O last 3 completed shifts: In: 5409.2 [I.V.:3499.2; Other:60; NG/GT:1500; IV Piggyback:350] Out: 3557 [Urine:1825]   PHYSICAL EXAMINATION: General: awake no distress Neuro: rass 0 following commands HEENT:  Neck supple, ett wnl, line clean Cardiovascular:  S1S2 +. No murmurs Lungs:  Cta Abdomen:  surgial scar +. Soft, non tender, normo active bowel sounds Musculoskeletal: mild edema Skin:  intact  LABS: PULMONARY  Recent Labs Lab 07/03/13 0459 07/03/13 0556 07/03/13 0913 07/04/13 0409 07/04/13 0904  PHART 7.223* 7.254* 7.293* 7.348* 7.347*  PCO2ART 35.7 28.6* 29.8* 28.9* 31.1*  PO2ART 425.0* 170.0* 143.0* 144.0* 158.0*  HCO3 14.8* 12.6* 14.5* 16.0* 17.2*  TCO2 16 13 15 17 18   O2SAT 100.0 99.0 99.0 99.0 99.0    CBC  Recent Labs Lab  07/05/13 0008 07/05/13 0500 07/05/13 0825  HGB 7.4* 7.4* 7.4*  HCT 21.3* 21.1* 21.2*  WBC 16.2* 16.9* 16.6*  PLT 90* 86* 94*    COAGULATION  Recent Labs Lab 06/30/13 0545 07/01/13 0523 07/02/13 0716 07/02/13 1600 07/03/13 0830  INR 1.96* 2.21* 1.15 1.20 1.44    CARDIAC    Recent Labs Lab 07/01/13 1957 07/02/13 1833 07/03/13 0036 07/03/13 0520 07/03/13 1236  TROPONINI <0.30 <0.30 <0.30 <0.30 <0.30    Recent Labs Lab 07/03/13 0500  PROBNP 7905.0*     CHEMISTRY  Recent Labs Lab 07/01/13 1647 07/02/13 0716 07/03/13 0100 07/03/13 0500 07/04/13 0408 07/05/13 0500  NA 139  137 140 137 137 135*  K 3.9 3.7 4.1 3.9 3.2* 3.6*  CL 109 104 114* 111 108 106  CO2 18* 18* 13* 12* 16* 17*  GLUCOSE 143* 98 170* 188* 161* 135*  BUN 63* 60* 56* 56* 51* 50*  CREATININE 2.69* 2.70* 2.29* 2.43* 2.63* 2.59*  CALCIUM 7.1* 7.5* 6.1* 6.5* 6.7* 7.5*  MG 2.0 2.0  --  1.8 1.8 1.8  PHOS 3.9 3.7  --  4.2 3.3 4.0   Estimated Creatinine Clearance: 22 ml/min (by C-G formula based on Cr of 2.59).   LIVER  Recent Labs Lab 06/30/13 0545 07/01/13 0523 07/02/13 0716 07/02/13 1600 07/03/13 0100 07/03/13 0830 07/04/13 0408  AST  --   --   --   --  13  --  15  ALT  --   --   --   --  9  --  8  ALKPHOS  --   --   --   --  42  --  60  BILITOT  --   --   --   --  1.1  --  0.4  PROT  --   --   --   --  2.9*  --  3.3*  ALBUMIN  --   --   --   --  1.2*  --  1.4*  INR 1.96* 2.21* 1.15 1.20  --  1.44  --      INFECTIOUS  Recent Labs Lab 07/03/13 0036 07/03/13 0555 07/03/13 1236  LATICACIDVEN 3.5* 1.6 0.9     ENDOCRINE CBG (last 3)   Recent Labs  07/05/13 0403 07/05/13 0758 07/05/13 1155  GLUCAP 144* 113* 99      IMAGING x48h  Dg Chest Port 1 View  07/05/2013   CLINICAL DATA:  Follow-up atelectasis.  EXAM: PORTABLE CHEST - 1 VIEW  COMPARISON:  None.  FINDINGS: Endotracheal tube removed. Gastric tube extends into the stomach. Central venous catheter in the SVC.  Progression of bibasilar atelectasis and small effusions. Increased left upper lobe atelectasis. Question mild fluid overload. Mild vascular congestion is present.  IMPRESSION: Progression of bibasilar atelectasis and small effusions. Question mild fluid overload   Electronically Signed   By: Franchot Gallo M.D.   On: 07/05/2013 07:30   Dg Chest Port 1 View  07/04/2013   CLINICAL DATA:  Evaluate endotracheal tube position  EXAM: PORTABLE CHEST - 1 VIEW  COMPARISON:  Portable chest x-ray of 07/03/2013  FINDINGS: The tip of the endotracheal tube is approximately 7.8 cm above the carina. Haziness at both lung  bases most likely represents effusions and atelectasis. Mild cardiomegaly is stable. The left central venous line tip overlies the mid SVC.  IMPRESSION: 1. Endotracheal tube tip 7.8 cm above the carina. 2. Little change in probable small effusions and basilar atelectasis.  Electronically Signed   By: Ivar Drape M.D.   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 Source not clear P:   Diet advance Dc ngt Cbc follow up to am  ppi to oral in am likely  HEMATOLOGIC A:  Anemia of bleeding. Resolved coagulapathy from coumadin resolved P:  Cbc to am scd  INFECTIOUS A: UTI (difficult to place, bladder ca) P:   empiric vanc, ceftaz, dc Add cipro, total duration 7 days   ENDOCRINE A:  Unlikely rel AI P:   stress steroids dc  NEUROLOGIC A:  deconditioned P:   PT consult  upright   TODAY'S SUMMARY:  To sdu, cbc in am   Lavon Paganini. Titus Mould, MD, [place] Pgr: [facility]

## 2013-07-05 NOTE — Progress Notes (Signed)
       Patient Name: [name] Date of Encounter: 07/05/2013    SUBJECTIVE: Awake and alert. Denied dyspnea. No chest pain.  TELEMETRY:  HR in a fib is fast, with VR 90-115 Filed Vitals:   07/05/13 0500 07/05/13 0600 07/05/13 0800 07/05/13 0854  BP:   128/41   Pulse: 105 51 69   Temp:    98 F (36.7 C)  TempSrc:    Oral  Resp: 26 12 16    Height:      Weight:      SpO2: 97% 100% 100%     Intake/Output Summary (Last 24 hours) at 07/05/13 0903 Last data filed at 07/05/13 0600  Gross per 24 hour  Intake 4040.42 ml  Output   1085 ml  Net 2955.42 ml    LABS: Basic Metabolic Panel:  Recent Labs  07/04/13 0408 07/05/13 0500  NA 137 135*  K 3.2* 3.6*  CL 108 106  CO2 16* 17*  GLUCOSE 161* 135*  BUN 51* 50*  CREATININE 2.63* 2.59*  CALCIUM 6.7* 7.5*  MG 1.8 1.8  PHOS 3.3 4.0   CBC:  Recent Labs  07/05/13 0500 07/05/13 0825  WBC 16.9* 16.6*  NEUTROABS 14.9*  --   HGB 7.4* 7.4*  HCT 21.1* 21.2*  MCV 87.9 88.0  PLT 86* 94*   Cardiac Enzymes:  Recent Labs  07/03/13 0036 07/03/13 0520 07/03/13 1236  TROPONINI <0.30 <0.30 <0.30     Radiology/Studies:  No new data  Physical Exam: Blood pressure 128/41, pulse 69, temperature 98 F (36.7 C), temperature source Oral, resp. rate 16, height 5\' 8"  (1.727 m), weight 166 lb 3.6 oz (75.4 kg), SpO2 100.00%. Weight change:    IIRR without AR. 2/6 systolic murmur Lying flat NG tube in place  ASSESSMENT:  1. Chronic atrial fib with poor rate control 2. S/P TAVR 3. CAD 4. GIB  Plan:  1. Rate control with dig until can resume beta blocker. Will give one dose .25 mg iv.   Demetrios Isaacs 07/05/2013, 9:03 AM

## 2013-07-05 NOTE — Progress Notes (Signed)
Stable. One "rust colored" bowel movement through the night. Hemoglobin stable at 7.4. Blood pressure actually, off pressors. Rapid ventricular rate with atrial fibrillation being addressed by cardiology. Patient feels well. Wants to eat. Successfully extubated yesterday, no breathing difficulty or chest pain at this time. Capsule endoscopy was negative for small bowel source of bleeding.  His duodenal polyp was a benign adenoma without high-grade dysplasia, and his colon polyp was also a benign adenoma without high-grade dysplasia.  Exam: Patient is alert. Heart rate around 353, systolic blood pressure 614. Chest clear anteriorly. Heart has a soft systolic murmur. Abdomen has bowel sounds, no distention, soft and nontender.  Labs: Per above. Hemoglobin 7.4, platelets low at 94,000, creatinine essentially stable at 2.6  Impression:  1. Quiescent recurrent GI bleeding. The initial episode occurred while over anticoagulated it is of uncertain origin. The second episode occurred following a polypectomy, while is not anticoagulated, and may have been either post polypectomy bleeding or diverticular in origin. If it was related to his polypectomy, further recurrent bleeding is highly unlikely. If it was related to his diverticular disease, further recurrent bleeding would be possible, probably about a 30% chance.  2. Posthemorrhagic anemia, moderate to severe  3. Benign adenomas of duodenum and colon, with prior history of colon cancer  Plan:  1. Okay to remove NG tube (order) 2. Have notified patient's primary gastroenterologist, Dr. Howell Rucks, of patient's endoscopic findings via voicemail. Decision about colonoscopic and/or endoscopic surveillance several years from now we'll be left up to him, based in part on the patient's clinical evolution over the next several years. The patient is approaching the age where surveillance is no longer appropriate. 3. Will allow a low residue diet. 4. We'll  back off on labs 5. From the GI standpoint, transfer to a step down unit would be reasonable. 6. Probably convert to oral PPI's tomorrow if he remains stable in the meantime 7. Have discussed the above plan with the patient's family members who are at the bedside. We are all very pleased with the patient's progress up to this time, but they recognize that there is some risk of recurrent bleeding, in which case the plan is for a tagged red cell scan and, most likely, a repeat arteriogram if positive. At this time, we do not have plans for repeat colonoscopy since the patient appears to stop bleeding.  Cleotis Nipper, M.D. [phone]

## 2013-07-06 ENCOUNTER — Encounter (HOSPITAL_COMMUNITY): Payer: Medicare Other

## 2013-07-06 DIAGNOSIS — I2510 Atherosclerotic heart disease of native coronary artery without angina pectoris: Secondary | ICD-10-CM

## 2013-07-06 DIAGNOSIS — M7989 Other specified soft tissue disorders: Secondary | ICD-10-CM

## 2013-07-06 LAB — CBC
HCT: 20.6 % — ABNORMAL LOW (ref 39.0–52.0)
HEMOGLOBIN: 7 g/dL — AB (ref 13.0–17.0)
MCH: 30.3 pg (ref 26.0–34.0)
MCHC: 34 g/dL (ref 30.0–36.0)
MCV: 89.2 fL (ref 78.0–100.0)
PLATELETS: 89 10*3/uL — AB (ref 150–400)
RBC: 2.31 MIL/uL — AB (ref 4.22–5.81)
RDW: 17.3 % — ABNORMAL HIGH (ref 11.5–15.5)
WBC: 10 10*3/uL (ref 4.0–10.5)

## 2013-07-06 LAB — BASIC METABOLIC PANEL
BUN: 46 mg/dL — ABNORMAL HIGH (ref 6–23)
CALCIUM: 7.5 mg/dL — AB (ref 8.4–10.5)
CO2: 19 mEq/L (ref 19–32)
Chloride: 108 mEq/L (ref 96–112)
Creatinine, Ser: 2.47 mg/dL — ABNORMAL HIGH (ref 0.50–1.35)
GFR calc Af Amer: 27 mL/min — ABNORMAL LOW (ref >90)
GFR, EST NON AFRICAN AMERICAN: 23 mL/min — AB (ref >90)
GLUCOSE: 95 mg/dL (ref 70–99)
POTASSIUM: 3.1 meq/L — AB (ref 3.7–5.3)
SODIUM: 138 meq/L (ref 137–147)

## 2013-07-06 LAB — GLUCOSE, CAPILLARY
GLUCOSE-CAPILLARY: 104 mg/dL — AB (ref 70–99)
Glucose-Capillary: 89 mg/dL (ref 70–99)
Glucose-Capillary: 126 mg/dL — ABNORMAL HIGH (ref 70–99)
Glucose-Capillary: 128 mg/dL — ABNORMAL HIGH (ref 70–99)

## 2013-07-06 MED ORDER — POTASSIUM CHLORIDE CRYS ER 20 MEQ PO TBCR
40.0000 meq | EXTENDED_RELEASE_TABLET | Freq: Once | ORAL | Status: AC
Start: 2013-07-06 — End: 2013-07-06
  Administered 2013-07-06: 40 meq via ORAL
  Filled 2013-07-06: qty 2

## 2013-07-06 MED ORDER — PANTOPRAZOLE SODIUM 40 MG PO TBEC
40.0000 mg | DELAYED_RELEASE_TABLET | Freq: Every day | ORAL | Status: DC
  Administered 2013-07-06 – 2013-07-08 (×3): 40 mg via ORAL
  Filled 2013-07-06 (×3): qty 1

## 2013-07-06 MED ORDER — FUROSEMIDE 40 MG PO TABS
60.0000 mg | ORAL_TABLET | Freq: Once | ORAL | Status: AC
  Administered 2013-07-06: 60 mg via ORAL
  Filled 2013-07-06: qty 1

## 2013-07-06 MED ORDER — POTASSIUM CHLORIDE CRYS ER 20 MEQ PO TBCR
40.0000 meq | EXTENDED_RELEASE_TABLET | Freq: Once | ORAL | Status: AC
  Administered 2013-07-06: 40 meq via ORAL
  Filled 2013-07-06: qty 2

## 2013-07-06 MED ORDER — PANTOPRAZOLE SODIUM 40 MG PO TBEC: 40.0000 mg | DELAYED_RELEASE_TABLET | Freq: Two times a day (BID) | ORAL | Status: DC

## 2013-07-06 NOTE — Progress Notes (Addendum)
Rate is well controlled. No new recommendations. We gave one dose of dig yesterday but now that back on beta blocker will d/c dig.

## 2013-07-06 NOTE — Progress Notes (Signed)
VASCULAR LAB PRELIMINARY  PRELIMINARY  PRELIMINARY  PRELIMINARY  Bilateral upper extremity venous duplex completed.    Preliminary report:  Right:  Chronic superficial thrombosis noted in the cephalic vein.   Left:  Localized non occlusive DVT noted in the proximal jugular vein at the collar bone.  Superficial thrombus noted in the cephalic vein from below the [facility] to above the [facility] at former IV site.  Joziah Dollins, RVT 07/06/2013, 2:07 PM

## 2013-07-06 NOTE — Evaluation (Signed)
Physical Therapy Evaluation Patient Details Name: [name] MRN: [id number] DOB: Aug 27, 1930 Today's Date: 07/06/2013 Time: 2458-0998 PT Time Calculation (min): 24 min  PT Assessment / Plan / Recommendation History of Present Illness  [name] is a 80 y.o. male  has a past medical history of Aortic valve disorder; PVD (peripheral vascular disease); Hypercholesteremia; HTN (hypertension); Atrial fibrillation; Atrial flutter; CKD (chronic kidney disease); AAA (abdominal aortic aneurysm); Atherosclerosis of native arteries of the extremities with intermittent claudication; CAD (coronary artery disease); Bladder cancer; Gallstone; Vitamin B12 deficiency; Colon cancer; Carotid stenosis; Gout;  Presented with10 day hx of black stools, feeling tired and fatigued and cold all the time. Denies any chest pain but endorses SOB and DOE. Patient was evaluated for this today by his PCP and had a CBC done showing Hg of 6.3   Clinical Impression  Patient presents with decreased independence with mobility due to deficits listed below (PT problem list).  He will benefit from skilled PT in the acute setting to allow return home with family assist and HHPT.    PT Assessment  Patient needs continued PT services    Follow Up Recommendations  Home health PT;Supervision/Assistance - 24 hour          Equipment Recommendations  None recommended by PT    Recommendations for Other Services     Frequency Min 3X/week    Precautions / Restrictions Precautions Precautions: Fall   Pertinent Vitals/Pain Min c/o left knee pain with ambulation      Mobility  Bed Mobility Overal bed mobility: Needs Assistance Bed Mobility: Supine to Sit;Sit to Supine Supine to sit: Min assist;HOB elevated Sit to supine: Min assist General bed mobility comments: assist to lift trunk into sitting; adjustments for comfort in supine Transfers Overall transfer level: Needs assistance Equipment used: Rolling walker (2  wheeled) Transfers: Sit to/from Stand Sit to Stand: Min assist General transfer comment: initially pulls up on walker, back to bed then pushed from bed Ambulation/Gait Ambulation/Gait assistance: Min guard Ambulation Distance (Feet): 110 Feet Assistive device: Rolling walker (2 wheeled) Gait Pattern/deviations: Trunk flexed;Shuffle;Step-through pattern;Decreased stride length General Gait Details: assist with walker for turning    Exercises     PT Diagnosis: Generalized weakness;Abnormality of gait  PT Problem List: Decreased strength;Decreased activity tolerance;Decreased balance;Decreased mobility;Decreased knowledge of use of DME PT Treatment Interventions: DME instruction;Therapeutic exercise;Gait training;Stair training;Balance training;Functional mobility training;Therapeutic activities;Patient/family education     PT Goals(Current goals can be found in the care plan section) Acute Rehab PT Goals Patient Stated Goal: To go home tomorrow PT Goal Formulation: With patient Time For Goal Achievement: 07/20/13 Potential to Achieve Goals: Good  Visit Information  Last PT Received On: 07/06/13 Assistance Needed: +1 History of Present Illness: [name] is a 80 y.o. male  has a past medical history of Aortic valve disorder; PVD (peripheral vascular disease); Hypercholesteremia; HTN (hypertension); Atrial fibrillation; Atrial flutter; CKD (chronic kidney disease); AAA (abdominal aortic aneurysm); Atherosclerosis of native arteries of the extremities with intermittent claudication; CAD (coronary artery disease); Bladder cancer; Gallstone; Vitamin B12 deficiency; Colon cancer; Carotid stenosis; Gout;  Presented with10 day hx of black stools, feeling tired and fatigued and cold all the time. Denies any chest pain but endorses SOB and DOE. Patient was evaluated for this today by his PCP and had a CBC done showing Hg of 6.3        Prior [place] expects  to be discharged to:: Private residence Living Arrangements: Spouse/significant  other Available Help at Discharge: Family Type of Home: House Home Access: Stairs to enter [employer] of Steps: 4; has rails on steps in garage, not front door Home Layout: Multi-level Alternate Level Stairs-Number of Steps: 15 Alternate Level Stairs-Rails: Right;Left;Can reach both Home Equipment: Cane - single point;Walker - 2 wheels;Shower seat;Grab bars - tub/shower Additional Comments: both tub shower and walk in shower uses both Prior Function Level of Independence: Independent Communication Communication: No difficulties Dominant Hand: Right    Cognition  Cognition Arousal/Alertness: Awake/alert Behavior During Therapy: WFL for tasks assessed/performed Overall Cognitive Status: Within Functional Limits for tasks assessed    Extremity/Trunk Assessment Lower Extremity Assessment Lower Extremity Assessment: Overall WFL for tasks assessed   Balance Balance Overall balance assessment: Needs assistance Sitting balance-Leahy Scale: Fair Standing balance support: Bilateral upper extremity supported Standing balance-Leahy Scale: Poor Standing balance comment: needs UE support for balance  End of Session PT - End of Session Equipment Utilized During Treatment: Gait belt Activity Tolerance: Patient limited by fatigue Patient left: in bed;with call bell/phone within reach  GP     [facility] 07/06/2013, 5:18 PM .Magda Kiel, [place] 07/06/2013

## 2013-07-06 NOTE — Progress Notes (Addendum)
PULMONARY  / CRITICAL CARE MEDICINE  Name: [name] MRN: [id number] DOB: [birth year] PCP Horton Finer, MD    ADMISSION DATE:  06/26/2013  LOS 10 days  CONSULTATION DATE:  07/01/13  REFERRING MD :  Dr Delfina Redwood  PRIMARY SERVICE: Sadie Haber -> PCCM  CHIEF COMPLAINT:  LGI bleed with hemodynamic instability  BRIEF PATIENT DESCRIPTION: 80 year old male with hx of polyps but refused screening scopies. Also A Fib and bioprosthetic aortic coumadin.s/p neo bladder early 83s Admitted 06/26/2013 wih 10 day shx of melena in setting of high INR 4. Underwent upper and lower endoscopies as above without sign of bleed. Also s/p polypectomnt Then on 07/01/13: had futher bleed but BRPR this time with bp/hr instability.  SIGNIFICANT EVENTS / STUDIES:  06/26/2013 - admit 3./11/15 - ugi scope - normal 06/30/13 - Colonsocpsy - ascending colon polypectomy. Scattered diverticulosis. Hemorrhoids. No active bleed 07/01/13 - Move tO ICU. Nuclear medicine scan negative for bleed. Foley inserted by Dr Risa Grill 3/15 angio neg, ett 3/16- remains on pressors, further bloody stools noted, 4 units prbc  3/16- colo- blood throughout colon, capsule endo deployed, HBG improved 3/16 [place] ABDo for AAA>>Slight enlargement of 3.9 cm fusiform abdominal aortic aneurysm<BR>and aneurysmal common iliac arteries as above 3/17 - extubated 3/18- result capsule endoscopy>>>neg  LINES / TUBES: CVL 07/01/13 -3/18 Foley (Dr Risa Grill) 07/01/13 3/16 ett>>>3/17  CULTURES: none  ANTIBIOTICS: Anti-infectives   Start     Dose/Rate Route Frequency Ordered Stop   07/05/13 1700  ciprofloxacin (CIPRO) tablet 500 mg     500 mg Oral Daily with supper 07/05/13 1515 07/09/13 2359   07/04/13 0800  vancomycin (VANCOCIN) IVPB 750 mg/150 ml premix  Status:  Discontinued     750 mg 150 mL/hr over 60 Minutes Intravenous Every 24 hours 07/03/13 0814 07/05/13 1515   07/03/13 0900  cefTAZidime (FORTAZ) 1 g in dextrose 5 % 50 mL IVPB  Status:   Discontinued     1 g 100 mL/hr over 30 Minutes Intravenous Every 12 hours 07/03/13 0814 07/05/13 1515   07/03/13 0815  vancomycin (VANCOCIN) IVPB 1000 mg/200 mL premix     1,000 mg 200 mL/hr over 60 Minutes Intravenous  Once 07/03/13 0814 07/03/13 0937   07/03/13 0204  ceFAZolin (ANCEF) 2-3 GM-% IVPB SOLR    Comments:  Thelma Barge   : cabinet override      07/03/13 0204 07/03/13 0313    \   SUBJECTIVE:  Awake no distress, eating well  VITAL SIGNS: Filed Vitals:   07/06/13 0421 07/06/13 0500 07/06/13 0600 07/06/13 0854  BP:      Pulse:  68 102   Temp: 98 F (36.7 C)   98.9 F (37.2 C)  TempSrc: Oral   Oral  Resp:  14 14   Height:      Weight:      SpO2:  99% 93%     VENTILATOR SETTINGS:  INTAKE / OUTPUT: I/O last 3 completed shifts: In: 2070 [I.V.:1460; Other:360; IV Piggyback:250] Out: 2140 [Urine:2140]   PHYSICAL EXAMINATION: General: awake no distress Neuro:nonfocal, comfortable HEENT:  Neck supple, ett wnl, line clean Cardiovascular:  S1S2 +. No murmurs Lungs:  Cta Abdomen:  surgial scar +. Soft, non tender, increased active bowel sounds Musculoskeletal: mild edema Skin:  intact  LABS: PULMONARY  Recent Labs Lab 07/03/13 0459 07/03/13 0556 07/03/13 0913 07/04/13 0409 07/04/13 0904  PHART 7.223* 7.254* 7.293* 7.348* 7.347*  PCO2ART 35.7 28.6* 29.8* 28.9* 31.1*  PO2ART 425.0* 170.0* 143.0* 144.0*  158.0*  HCO3 14.8* 12.6* 14.5* 16.0* 17.2*  TCO2 16 13 15 17 18   O2SAT 100.0 99.0 99.0 99.0 99.0    CBC  Recent Labs Lab 07/05/13 0500 07/05/13 0825 07/06/13 0243  HGB 7.4* 7.4* 7.0*  HCT 21.1* 21.2* 20.6*  WBC 16.9* 16.6* 10.0  PLT 86* 94* 89*    COAGULATION  Recent Labs Lab 06/30/13 0545 07/01/13 0523 07/02/13 0716 07/02/13 1600 07/03/13 0830  INR 1.96* 2.21* 1.15 1.20 1.44    CARDIAC    Recent Labs Lab 07/01/13 1957 07/02/13 1833 07/03/13 0036 07/03/13 0520 07/03/13 1236  TROPONINI <0.30 <0.30 <0.30 <0.30 <0.30     Recent Labs Lab 07/03/13 0500  PROBNP 7905.0*     CHEMISTRY  Recent Labs Lab 07/01/13 1647 07/02/13 0716 07/03/13 0100 07/03/13 0500 07/04/13 0408 07/05/13 0500 07/06/13 0243  NA 139 137 140 137 137 135* 138  K 3.9 3.7 4.1 3.9 3.2* 3.6* 3.1*  CL 109 104 114* 111 108 106 108  CO2 18* 18* 13* 12* 16* 17* 19  GLUCOSE 143* 98 170* 188* 161* 135* 95  BUN 63* 60* 56* 56* 51* 50* 46*  CREATININE 2.69* 2.70* 2.29* 2.43* 2.63* 2.59* 2.47*  CALCIUM 7.1* 7.5* 6.1* 6.5* 6.7* 7.5* 7.5*  MG 2.0 2.0  --  1.8 1.8 1.8  --   PHOS 3.9 3.7  --  4.2 3.3 4.0  --    Estimated Creatinine Clearance: 25.7 ml/min (by C-G formula based on Cr of 2.47).   LIVER  Recent Labs Lab 06/30/13 0545 07/01/13 0523 07/02/13 0716 07/02/13 1600 07/03/13 0100 07/03/13 0830 07/04/13 0408  AST  --   --   --   --  13  --  15  ALT  --   --   --   --  9  --  8  ALKPHOS  --   --   --   --  42  --  60  BILITOT  --   --   --   --  1.1  --  0.4  PROT  --   --   --   --  2.9*  --  3.3*  ALBUMIN  --   --   --   --  1.2*  --  1.4*  INR 1.96* 2.21* 1.15 1.20  --  1.44  --      INFECTIOUS  Recent Labs Lab 07/03/13 0036 07/03/13 0555 07/03/13 1236  LATICACIDVEN 3.5* 1.6 0.9     ENDOCRINE CBG (last 3)   Recent Labs  07/05/13 1924 07/05/13 2202 07/06/13 0812  GLUCAP 104* 102* 89      IMAGING x48h  Dg Chest Port 1 View  07/05/2013   CLINICAL DATA:  Follow-up atelectasis.  EXAM: PORTABLE CHEST - 1 VIEW  COMPARISON:  None.  FINDINGS: Endotracheal tube removed. Gastric tube extends into the stomach. Central venous catheter in the SVC.  Progression of bibasilar atelectasis and small effusions. Increased left upper lobe atelectasis. Question mild fluid overload. Mild vascular congestion is present.  IMPRESSION: Progression of bibasilar atelectasis and small effusions. Question mild fluid overload   Electronically Signed   By: Franchot Gallo M.D.   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 Source not clear P:   Diet advance tolerated well Cbc follow up to am  ppi to oral  HEMATOLOGIC A:  Anemia of bleeding. Resolved coagulapathy from coumadin resolved P:  Cbc to am scd Arms swollen, doppler, i think this is edema and infiltrates prior IV  INFECTIOUS A: UTI (difficult to place, bladder ca) P:   cipro, total duration 7 days May need repeat UA  ENDOCRINE A:  Unlikely rel AI P:   No roids  NEUROLOGIC A:  deconditioned P:   PT consult  upright   TODAY'S SUMMARY:  To tele, to [place]. Titus Mould, MD, [place] Pgr: [facility]

## 2013-07-06 NOTE — Progress Notes (Signed)
I have been following the events on [name] and appreciate everyone's huge efforts.   I note that Dr. Janit Pagan note from 3/18 wondered about his baseline creatinine. Over the last 7 years, his creatinine has varied from 1.6 to 2.1 and over the last 3 years has been 2.0-2.1. However, as far back as 2007 it was 2.1 (and improved some between 2007 and 2012).   We will be happy to assume care when he is ready to be transferred from [facility] care. Please call me at [phone] when he is ready for transfer.

## 2013-07-06 NOTE — Progress Notes (Signed)
Stable overnight. Slight decline in hemoglobin since yesterday, probably equilibration. Has had minimal stool output, which by the patient's report, it is becoming brown in character, rather than bloody.  Impression: No evidence of further bleeding  Recommendation:  1. I will sign off. The patient is at low risk for further bleeding, but we would certainly be happy to see him again at your request.  2. I have ordered daily CBC and daily BMETt. Please feel free to modify his orders at your discretion.  3. I have advanceD the patient to a heart healthy diet.  4. I have converted him to oral PPI therapy. I would continue this following discharge for the purpose of prophylaxis, if the patient is going to remain on a combination of aspirin and Coumadin, which places him at some risk for GI bleeding.  5. Please call us if you have any questions.

## 2013-07-06 NOTE — Progress Notes (Signed)
Received pt from [place] at [place]. Medications caught up, patient placed in chair and breakfast ordered Vital signs stable and family updated

## 2013-07-06 NOTE — Progress Notes (Signed)
Pt appears stable at this point without clear source of bleeding.  Will sign off for now but please call if anything changes.

## 2013-07-07 LAB — CBC
HEMATOCRIT: 21.8 % — AB (ref 39.0–52.0)
Hemoglobin: 7.4 g/dL — ABNORMAL LOW (ref 13.0–17.0)
MCH: 30.5 pg (ref 26.0–34.0)
MCHC: 33.9 g/dL (ref 30.0–36.0)
MCV: 89.7 fL (ref 78.0–100.0)
Platelets: 104 10*3/uL — ABNORMAL LOW (ref 150–400)
RBC: 2.43 MIL/uL — AB (ref 4.22–5.81)
RDW: 17.1 % — ABNORMAL HIGH (ref 11.5–15.5)
WBC: 8.8 10*3/uL (ref 4.0–10.5)

## 2013-07-07 LAB — BASIC METABOLIC PANEL
BUN: 40 mg/dL — AB (ref 6–23)
CALCIUM: 7.9 mg/dL — AB (ref 8.4–10.5)
CHLORIDE: 109 meq/L (ref 96–112)
CO2: 20 mEq/L (ref 19–32)
CREATININE: 2.24 mg/dL — AB (ref 0.50–1.35)
GFR calc Af Amer: 30 mL/min — ABNORMAL LOW (ref >90)
GFR calc non Af Amer: 26 mL/min — ABNORMAL LOW (ref >90)
GLUCOSE: 125 mg/dL — AB (ref 70–99)
Potassium: 3.4 mEq/L — ABNORMAL LOW (ref 3.7–5.3)
Sodium: 140 mEq/L (ref 137–147)

## 2013-07-07 LAB — GLUCOSE, CAPILLARY
GLUCOSE-CAPILLARY: 113 mg/dL — AB (ref 70–99)
GLUCOSE-CAPILLARY: 119 mg/dL — AB (ref 70–99)
GLUCOSE-CAPILLARY: 129 mg/dL — AB (ref 70–99)
Glucose-Capillary: 128 mg/dL — ABNORMAL HIGH (ref 70–99)

## 2013-07-07 MED ORDER — FUROSEMIDE 20 MG PO TABS
20.0000 mg | ORAL_TABLET | Freq: Every morning | ORAL | Status: DC
  Administered 2013-07-07 – 2013-07-08 (×2): 20 mg via ORAL
  Filled 2013-07-07 (×2): qty 1

## 2013-07-07 MED ORDER — METOPROLOL TARTRATE 25 MG PO TABS: 25.0000 mg | ORAL_TABLET | Freq: Two times a day (BID) | ORAL | Status: DC

## 2013-07-07 MED ORDER — CIPROFLOXACIN HCL 500 MG PO TABS: 500.0000 mg | ORAL_TABLET | Freq: Every day | ORAL | Status: DC

## 2013-07-07 NOTE — Progress Notes (Addendum)
Patient Name: [name] Date of Encounter: 07/07/2013  Principal Problem:   Anemia, blood loss Active Problems:   CKD (chronic kidney disease)   Aortic valve disorder   HTN (hypertension)   Atrial fibrillation   Coronary atherosclerosis of native coronary artery   Upper GI bleed   Malnutrition of moderate degree   Acute on chronic renal failure   Gastrointestinal hemorrhage   Shock circulatory    SUBJECTIVE: Very weak but denies chest pain or shortness of breath.  OBJECTIVE Filed Vitals:   07/06/13 1400 07/06/13 2033 07/07/13 0606 07/07/13 1042  BP: 97/49 120/64 113/49 105/46  Pulse: 86 79 66 82  Temp: 98 F (36.7 C) 98.2 F (36.8 C) 98.4 F (36.9 C)   TempSrc: Oral Oral Oral   Resp: 20 20 20    Height:      Weight:      SpO2: 100% 97% 97%     Intake/Output Summary (Last 24 hours) at 07/07/13 1152 Last data filed at 07/07/13 0606  Gross per 24 hour  Intake   2220 ml  Output    650 ml  Net   1570 ml   Filed Weights   06/27/13 0030 07/06/13 0400 07/06/13 1220  Weight: 166 lb 3.6 oz (75.4 kg) 193 lb 5.5 oz (87.7 kg) 186 lb 9.6 oz (84.641 kg)    PHYSICAL EXAM General: Well developed, slender, male in no acute distress. Head: Normocephalic, atraumatic.  Neck: Supple without bruits, JVD somewhat elevated but patient is supine. Lungs:  Resp regular and unlabored, decreased breath sounds bilateral bases, R>L. Heart: Irregular, S1, S2, no S3, S4, harsh systolic murmur; no rub. Abdomen: Soft, non-tender, non-distended, BS + x 4.  Extremities: No clubbing, cyanosis, no edema.  Neuro: Alert and oriented X 3. Moves all extremities spontaneously. Psych: Normal affect.  LABS: CBC: Recent Labs  07/05/13 0500  07/06/13 0243 07/07/13 0548  WBC 16.9*  < > 10.0 8.8  NEUTROABS 14.9*  --   --   --   HGB 7.4*  < > 7.0* 7.4*  HCT 21.1*  < > 20.6* 21.8*  MCV 87.9  < > 89.2 89.7  PLT 86*  < > 89* 104*  < > = values in this interval not displayed.  Basic  Metabolic Panel: Recent Labs  07/05/13 0500 07/06/13 0243 07/07/13 0548  NA 135* 138 140  K 3.6* 3.1* 3.4*  CL 106 108 109  CO2 17* 19 20  GLUCOSE 135* 95 125*  BUN 50* 46* 40*  CREATININE 2.59* 2.47* 2.24*  CALCIUM 7.5* 7.5* 7.9*  MG 1.8  --   --   PHOS 4.0  --   --    BNP: Pro B Natriuretic peptide (BNP)  Date/Time Value Ref Range Status  07/03/2013  5:00 AM 7905.0* 0 - 450 pg/mL Final   TELE:   Atrial fib, no pauses greater than 2 seconds, heart rate generally well-controlled   Radiology/Studies: Dg Chest Port 1 View 07/04/2013   CLINICAL DATA:  Evaluate endotracheal tube position  EXAM: PORTABLE CHEST - 1 VIEW  COMPARISON:  Portable chest x-ray of 07/03/2013  FINDINGS: The tip of the endotracheal tube is approximately 7.8 cm above the carina. Haziness at both lung bases most likely represents effusions and atelectasis. Mild cardiomegaly is stable. The left central venous line tip overlies the mid SVC.  IMPRESSION: 1. Endotracheal tube tip 7.8 cm above the carina. 2. Little change in probable small effusions and basilar atelectasis.  Electronically Signed   By: Ivar Drape M.D.   On: 07/04/2013 08:12   Dg Chest Port 1 View 07/03/2013   CLINICAL DATA:  Intubation.  EXAM: PORTABLE CHEST - 1 VIEW  COMPARISON:  DG CHEST 1V PORT dated 07/01/2013  FINDINGS: Endotracheal tube noted with its tip approximately 6 cm above the carina. Left IJ tube noted with tip projected over superior vena cava. Cardiomegaly, normal pulmonary vascularity. Bilateral basilar interstitial lung infiltrates with Kerley B-lines are noted. Left-sided pleural effusion. These findings are consistent with congestive heart failure with pulmonary edema. Prior CABG. No pneumothorax or acute bony abnormality.  IMPRESSION: 1. Endotracheal tube and left IJ line in good anatomic position. 2. Findings suggesting congestive heart failure with interstitial edema and small left pleural effusion.   Electronically Signed   By: Marcello Moores   Register   On: 07/03/2013 01:56    Current Medications:  . antiseptic oral rinse  15 mL Mouth Rinse q12n4p  . chlorhexidine  15 mL Mouth Rinse BID  . fentaNYL  200 mcg Intravenous Once  . insulin aspart  0-15 Units Subcutaneous TID WC  . metoprolol tartrate  25 mg Oral BID  . pantoprazole  40 mg Oral Q0600  . polyethylene glycol  17 g Oral BID   . fentaNYL infusion INTRAVENOUS Stopped (07/04/13 0915)    ASSESSMENT AND PLAN: Atrial fibrillation - permanent atrial fibrillation, with rapid ventricular response in the setting of acute illness: HR generally well-controlled on low-dose BB, no doses held. Continue current Rx.  Possible volume overload: CXR 03/17 w/ small effusions, decreased BS bases now, was on Lasix 40 mg qd at home, will restart at 20 mg qd and follow BP/volume status. If pt becomes SOB or O2 sats decrease, recheck CXR and consider IV Lasix.  Hx TAVR: Last echo at [facility] a year ago. EF 55% and mild AR, w/ mild perivalvular leak. F/u in office and repeat as OP.  Otherwise, per primary MD, CCM, GI  Signed, Rosaria Ferries , PA-C 11:52 AM 07/07/2013  I seen and examined patient this morning along with Rosaria Ferries, PA-C. I agree with her findings, examination and recommendations.  Permanent A. fib well controlled rate while back on beta blocker. Initially was given IV digoxin.  Would follow heart rate and resume low-dose metoprolol.   If blood pressure is an issue amiodarone could be used to control heart rate.   Given active GI bleed would not resume Coumadin at this point. Once it is clear his GI bleeding has stopped we could resume his Coumadin at that point (? 4-6 weeks).     GI bleed. I do agree that he probably would benefit from transfusion based on his worsening renal function. However he is transfused, would consider Lasix following transfusion.    He is on standing dose of Lasix at home. We'll restart at a lower dose.  He has a harsh murmur from his  TAVR - would consider taking an echocardiogram to ensure that there is no stenosis. However this is probably more related to calcification in the original aortic valve ring.   He does not some suggestion of pulmonary vascular congestion on recent chest x-ray. I agree with restarting low-dose Lasix.   No active cardiac issues as he is now controlled in A. fib rate. Available if needed for assistance. We will also assist in setting of followup with Dr. Irish Lack.  Leonie Man, M.D., M.S. Interventional Cardiologist  [place] Pager # [phone] 07/07/2013

## 2013-07-07 NOTE — Progress Notes (Signed)
Assessment/Plan: Principal Problem:   Anemia, blood loss - Hb is still 7. Recheck pending today. May want to transfuse to give him some cushion against further bleeding.  Active Problems:   CKD (chronic kidney disease) - creatinine is 2 or so at baseline. Now 2.4   Aortic valve disorder   HTN (hypertension)   Atrial fibrillation - off of anticoagulation for now.    Coronary atherosclerosis of native coronary artery   Upper GI bleed - presumed due to melena. No source found. Resolved.    Malnutrition of moderate degree   Acute on chronic renal failure   Lower Gastrointestinal hemorrhage - seems to have resolved   Shock circulatory - resolved.    Possible UTI - was on Cipro for possible UTI but culture was no growth so will stop it.    Subjective: Feeling pretty good. Did well with PT he thinks and PT note corroborates. No chest pain or further bleeding.   Objective:  Vital Signs: Filed Vitals:   07/06/13 1220 07/06/13 1400 07/06/13 2033 07/07/13 0606  BP: 120/61 97/49 120/64 113/49  Pulse: 89 86 79 66  Temp:  98 F (36.7 C) 98.2 F (36.8 C) 98.4 F (36.9 C)  TempSrc: Oral Oral Oral Oral  Resp: 20 20 20 20   Height:      Weight: 84.641 kg (186 lb 9.6 oz)     SpO2: 97% 100% 97% 97%     EXAM: alert. Comfortable.    Intake/Output Summary (Last 24 hours) at 07/07/13 0649 Last data filed at 07/07/13 0606  Gross per 24 hour  Intake   2220 ml  Output   1000 ml  Net   1220 ml    Lab Results:  Recent Labs  07/05/13 0500 07/06/13 0243  NA 135* 138  K 3.6* 3.1*  CL 106 108  CO2 17* 19  GLUCOSE 135* 95  BUN 50* 46*  CREATININE 2.59* 2.47*  CALCIUM 7.5* 7.5*  MG 1.8  --   PHOS 4.0  --    No results found for this basename: AST, ALT, ALKPHOS, BILITOT, PROT, ALBUMIN,  in the last 72 hours No results found for this basename: LIPASE, AMYLASE,  in the last 72 hours  Recent Labs  07/05/13 0500 07/05/13 0825 07/06/13 0243  WBC 16.9* 16.6* 10.0  NEUTROABS 14.9*  --    --   HGB 7.4* 7.4* 7.0*  HCT 21.1* 21.2* 20.6*  MCV 87.9 88.0 89.2  PLT 86* 94* 89*   No results found for this basename: CKTOTAL, CKMB, CKMBINDEX, TROPONINI,  in the last 72 hours BNP    Component Value Date/Time   PROBNP 7905.0* 07/03/2013 0500   No results found for this basename: DDIMER,  in the last 72 hours No results found for this basename: HGBA1C,  in the last 72 hours No results found for this basename: CHOL, HDL, LDLCALC, TRIG, CHOLHDL, LDLDIRECT,  in the last 72 hours No results found for this basename: TSH, T4TOTAL, FREET3, T3FREE, THYROIDAB,  in the last 72 hours No results found for this basename: VITAMINB12, FOLATE, FERRITIN, TIBC, IRON, RETICCTPCT,  in the last 72 hours  Studies/Results: No results found. Medications: Medications administered in the last 24 hours reviewed.  Current Medication List reviewed.    LOS: 11 days   [facility] @ [facility] [phone]) 07/07/2013, 6:49 AM

## 2013-07-08 LAB — CBC
HCT: 21 % — ABNORMAL LOW (ref 39.0–52.0)
Hemoglobin: 7.1 g/dL — ABNORMAL LOW (ref 13.0–17.0)
MCH: 30.5 pg (ref 26.0–34.0)
MCHC: 33.8 g/dL (ref 30.0–36.0)
MCV: 90.1 fL (ref 78.0–100.0)
PLATELETS: 122 10*3/uL — AB (ref 150–400)
RBC: 2.33 MIL/uL — ABNORMAL LOW (ref 4.22–5.81)
RDW: 17.1 % — AB (ref 11.5–15.5)
WBC: 8.1 10*3/uL (ref 4.0–10.5)

## 2013-07-08 LAB — GLUCOSE, CAPILLARY: Glucose-Capillary: 111 mg/dL — ABNORMAL HIGH (ref 70–99)

## 2013-07-08 LAB — BASIC METABOLIC PANEL
BUN: 39 mg/dL — AB (ref 6–23)
CO2: 20 mEq/L (ref 19–32)
Calcium: 7.7 mg/dL — ABNORMAL LOW (ref 8.4–10.5)
Chloride: 108 mEq/L (ref 96–112)
Creatinine, Ser: 2.27 mg/dL — ABNORMAL HIGH (ref 0.50–1.35)
GFR, EST AFRICAN AMERICAN: 30 mL/min — AB (ref >90)
GFR, EST NON AFRICAN AMERICAN: 26 mL/min — AB (ref >90)
Glucose, Bld: 115 mg/dL — ABNORMAL HIGH (ref 70–99)
POTASSIUM: 3.5 meq/L — AB (ref 3.7–5.3)
Sodium: 140 mEq/L (ref 137–147)

## 2013-07-08 MED ORDER — ACETAMINOPHEN 325 MG PO TABS
650.0000 mg | ORAL_TABLET | ORAL | Status: DC | PRN
  Administered 2013-07-08: 650 mg via ORAL
  Filled 2013-07-08: qty 2

## 2013-07-08 MED ORDER — FERROUS SULFATE 325 (65 FE) MG PO TBEC: 325.0000 mg | DELAYED_RELEASE_TABLET | Freq: Every day | ORAL | Status: DC

## 2013-07-08 NOTE — Progress Notes (Signed)
Pt c/o of urethral pain, catheter leaking, no trauma or blood noted. On call urologist notified, foley to remain in place and pain medication ordered. Will continue to monitor patient.

## 2013-07-08 NOTE — Discharge Instructions (Signed)
If you have any further bleeding, go to the hospital immediately.

## 2013-07-08 NOTE — Progress Notes (Signed)
Subjective: Feels well without complaints  Objective: Vital signs in last 24 hours: Temp:  [98.3 F (36.8 C)-98.4 F (36.9 C)] 98.3 F (36.8 C) (03/21 0600) Pulse Rate:  [62-88] 88 (03/21 0600) Resp:  [19-20] 19 (03/21 0600) BP: (101-122)/(36-66) 122/66 mmHg (03/21 0600) SpO2:  [97 %-98 %] 98 % (03/21 0600) Weight change:  Last BM Date: 07/06/13  Intake/Output from previous day: 03/20 0701 - 03/21 0700 In: -  Out: 450 [Urine:450] Intake/Output this shift:    General appearance: alert and cooperative Resp: clear to auscultation bilaterally Cardio: irregularly irregular rhythm and systolic murmur: systolic ejection 2/6, medium pitch at 2nd right intercostal space GI: soft, non-tender; bowel sounds normal; no masses,  no organomegaly Extremities: edema trace  Lab Results:  Recent Labs  07/07/13 0548 07/08/13 0352  WBC 8.8 8.1  HGB 7.4* 7.1*  HCT 21.8* 21.0*  PLT 104* 122*   BMET  Recent Labs  07/07/13 0548 07/08/13 0352  NA 140 140  K 3.4* 3.5*  CL 109 108  CO2 20 20  GLUCOSE 125* 115*  BUN 40* 39*  CREATININE 2.24* 2.27*  CALCIUM 7.9* 7.7*    Studies/Results: No results found.  Medications: I have reviewed the patient's current medications.  Assessment/Plan: Principal Problem:  Anemia, blood loss - Hb is stable at 7. Active Problems:  CKD (chronic kidney disease) - creatinine is 2 or so at baseline. Now 2.27  Aortic valve disorder  HTN (hypertension)  Atrial fibrillation - off of anticoagulation for 4-6 weeks Coronary atherosclerosis of native coronary artery  Upper GI bleed - presumed due to melena. No source found. Resolved.  Malnutrition of moderate degree  Acute on chronic renal failure  Lower Gastrointestinal hemorrhage - seems to have resolved  Shock circulatory - resolved.  Possible UTI - was on Cipro for possible UTI but culture was no growth so will stop it.  Disposition  Discharge with home health   LOS: 12 days   Alycia Cooperwood  JOSEPH 07/08/2013, 8:54 AM

## 2013-07-08 NOTE — Progress Notes (Signed)
Physical Therapy Treatment Patient Details Name: [name] MRN: [id number] DOB: Dec 14, 1930 Today's Date: 07/08/2013 Time: 2683-4196 PT Time Calculation (min): 27 min  PT Assessment / Plan / Recommendation  History of Present Illness [name] is a 80 y.o. male  has a past medical history of Aortic valve disorder; PVD (peripheral vascular disease); Hypercholesteremia; HTN (hypertension); Atrial fibrillation; Atrial flutter; CKD (chronic kidney disease); AAA (abdominal aortic aneurysm); Atherosclerosis of native arteries of the extremities with intermittent claudication; CAD (coronary artery disease); Bladder cancer; Gallstone; Vitamin B12 deficiency; Colon cancer; Carotid stenosis; Gout;  Presented with10 day hx of black stools, feeling tired and fatigued and cold all the time. Denies any chest pain but endorses SOB and DOE. Patient was evaluated for this today by his PCP and had a CBC done showing Hg of 6.3    PT Comments   Pt is very weak and deconditioned, but able to walk the halls and go up and down the stairs with support.  He reports his wife is strong and healthy and can provide this assist.  He also reports he can sleep in the recliner chair downstairs until he feels strong enough to do all of the stairs at his home.  Continue to recommend HHPT f/u at discharge.   Follow Up Recommendations  Home health PT;Supervision/Assistance - 24 hour     Does the patient have the potential to tolerate intense rehabilitation    NA  Barriers to Discharge   None      Equipment Recommendations  None recommended by PT;Other (comment) (pt already has equipment at home)    Recommendations for Other Services   None  Frequency Min 3X/week   Progress towards PT Goals Progress towards PT goals: Progressing toward goals  Plan Current plan remains appropriate    Precautions / Restrictions Precautions Precautions: Fall Precaution Comments: generally weak and deconditioned.  Limited activity  tolerance and slow gait speed   Pertinent Vitals/Pain HR 90s-120 during mobility.  DOE after stairs 3-4/4 requiring a sitting rest break.  Pt's O2 sats remained in the mid 90s on RA despite increased DOE.      Mobility  Transfers Overall transfer level: Needs assistance Equipment used: Rolling walker (2 wheeled) Transfers: Sit to/from Stand Sit to Stand: Min guard;Mod assist General transfer comment: Min assist to support trunk over weak legs, up to mod assist from lower surfaces (recliner chair and he sat and rested on the second step after doing the stairs) Ambulation/Gait Ambulation/Gait assistance: Min guard Ambulation Distance (Feet): 50 Feet Assistive device: Rolling walker (2 wheeled) Gait Pattern/deviations: Step-through pattern;Shuffle;Trunk flexed Gait velocity: decreased Gait velocity interpretation: Below normal speed for age/gender General Gait Details: Verbal cues for upright posture and safe RW use.  Pt with flexed, shuffling pattern and after doing the stairs did not have the strength, edurance, or cardiopulmonary capacity to walk any further.  Stairs: Yes Stairs assistance: Mod assist Stair Management: One rail Right;Step to pattern;Forwards (hand held assist left.  ) Number of Stairs: 9 General stair comments: Pt needed mod assist to support trunk while going up and down the stairs.  Pt with flexed/soft knees throughout the stair process.  He will likely need to stay downstairs until the home therapist can work on building his strength to do the stairs.        PT Goals (current goals can now be found in the care plan section) Acute Rehab PT Goals Patient Stated Goal: To go home tomorrow  Visit Information  Last PT Received On: 07/08/13 Assistance Needed: +1 History of Present Illness: [name] is a 80 y.o. male  has a past medical history of Aortic valve disorder; PVD (peripheral vascular disease); Hypercholesteremia; HTN (hypertension); Atrial fibrillation;  Atrial flutter; CKD (chronic kidney disease); AAA (abdominal aortic aneurysm); Atherosclerosis of native arteries of the extremities with intermittent claudication; CAD (coronary artery disease); Bladder cancer; Gallstone; Vitamin B12 deficiency; Colon cancer; Carotid stenosis; Gout;  Presented with10 day hx of black stools, feeling tired and fatigued and cold all the time. Denies any chest pain but endorses SOB and DOE. Patient was evaluated for this today by his PCP and had a CBC done showing Hg of 6.3     Subjective Data  Subjective: Pt is very weak and deconditioned.  Reports if he needs to he can sleep in his recliner chair downstairs until he gets strong enough to go [place] at home.   Patient Stated Goal: To go home tomorrow   Cognition  Cognition Arousal/Alertness: Awake/alert Behavior During Therapy: WFL for tasks assessed/performed Overall Cognitive Status: Within Functional Limits for tasks assessed    Balance  Balance Overall balance assessment: Needs assistance Standing balance support: Bilateral upper extremity supported Standing balance-Leahy Scale: Poor Standing balance comment: needs upper extremity support in standing.   General Comments General comments (skin integrity, edema, etc.): DOE 3-4/4 with gait.  O2 sats on RA remain in the mid 90s. HR increased to 120 during mobility.   End of Session PT - End of Session Activity Tolerance: Patient limited by fatigue;Treatment limited secondary to medical complications (Comment) (limited by DOE during activity) Patient left: in chair;with call bell/phone within reach;with chair alarm set Nurse Communication: Mobility status     Wells Guiles B. [place], [place], DPT [phone]   07/08/2013, 10:51 AM

## 2013-07-08 NOTE — Progress Notes (Signed)
Subjective:  C/o weakness. No chest pain or SOB.  Objective:  Vital Signs in the last 24 hours: BP 122/66  Pulse 88  Temp(Src) 98.3 F (36.8 C) (Oral)  Resp 19  Ht 5\' 8"  (1.727 m)  Wt 84.641 kg (186 lb 9.6 oz)  BMI 28.38 kg/m2  SpO2 98%  Physical Exam: Elderly WM in NAD Lungs:  Clear Cardiac:  Regular rhythm, normal S1 and S2, no S3, 2/6 systolic murmur Abdomen:  Soft, nontender, no masses Extremities:  No edema present  Intake/Output from previous day: 03/20 0701 - 03/21 0700 In: -  Out: 450 [Urine:450]  Weight Filed Weights   06/27/13 0030 07/06/13 0400 07/06/13 1220  Weight: 75.4 kg (166 lb 3.6 oz) 87.7 kg (193 lb 5.5 oz) 84.641 kg (186 lb 9.6 oz)    Lab Results: Basic Metabolic Panel:  Recent Labs  07/07/13 0548 07/08/13 0352  NA 140 140  K 3.4* 3.5*  CL 109 108  CO2 20 20  GLUCOSE 125* 115*  BUN 40* 39*  CREATININE 2.24* 2.27*   CBC:  Recent Labs  07/07/13 0548 07/08/13 0352  WBC 8.8 8.1  HGB 7.4* 7.1*  HCT 21.8* 21.0*  MCV 89.7 90.1  PLT 104* 122*   Telemetry: A fib in controlled response  Assessment/Plan:  1.  Recent GI bleed - may need transfusion with Hct 21 and Hgb 7.1  No margin 2. S/p TAVR 3. A fib now with controlled response 4. CKD stage 4   Rec:  With drop from 9.2 to 7.1 may need transfusion.    Kerry Hough  MD [facility]  07/08/2013, 9:27 AM

## 2013-07-09 LAB — CULTURE, BLOOD (ROUTINE X 2)
CULTURE: NO GROWTH
CULTURE: NO GROWTH

## 2013-07-10 DIAGNOSIS — D126 Benign neoplasm of colon, unspecified: Secondary | ICD-10-CM | POA: Diagnosis not present

## 2013-07-10 NOTE — Discharge Summary (Signed)
Physician Discharge Summary  NAME:[name]  QQV:[id number]  DOB: 07/07/1930   Admit date: 06/26/2013 Discharge date: 07/10/2013  Admitting Diagnosis: acute blood loss anemia  Discharge Diagnoses:  Active Hospital Problems   Diagnosis Date Noted  . Anemia, blood loss 06/26/2013  . Adenomatous colon polyp 07/10/2013  . Lower GI hemorrhage - after admission 07/01/2013  . Shock circulatory - due to LGI hemorrhage 07/01/2013  . Acute on chronic renal failure 06/30/2013  . Malnutrition of moderate degree 06/27/2013  . Upper GI bleed - presumed, prior to admission 06/26/2013  . Coronary atherosclerosis of native coronary artery 02/03/2013  . CKD (chronic kidney disease) stage 3, GFR 30-59 ml/min   . Aortic valve disorder   . Atrial fibrillation   . HTN (hypertension)     Resolved Hospital Problems   Diagnosis Date Noted Date Resolved  No resolved problems to display.    Things to follow up in the outpatient setting: Recheck CBC this week  Hospital Course: The patient was admitted when his Hb was found to be 6.3 in the office. After admission, he was transfused. Stool was black and heme positive. At that time, he was on warfarin.   He underwent EGD and there were no findings. Subsequently, had a colonoscopy with findings of a large right colonic polyp. Pathology showed it to be a tubular adenoma. On March 13, he was doing well and d/c was planned for March 14. However, on the evening of 3/13, he had a number of blood stools and went into shock. He was transferred to the MICU. Over the next several days, he had intermittent, severe lower GI bleeding. He was intubated at one point to protect his airway. He had two colonoscopies, a nuclear medicine bleeding scan and an arteriogram. None showed an active bleeding site. Blood was seen into the right colon. It was not clear if he was bleeding from the polypectomy site or from diverticular bleeding  Bleeding finally ceased. Over the next 2-3  days, he remained stable, was able to get out of bed and ambulate and was discharged with a Hb of 7.4.   Discharge Condition: improved  Consults: GI, CCM  Disposition: 06-Home-Health Care Svc   Future Appointments Provider Department Dept Phone   07/11/2013 6:45 AM [facility] [place] [phone]   07/12/2013 6:45 AM [facility] [place] [phone]   07/13/2013 6:45 AM [facility] [place] [phone]   07/18/2013 6:45 AM [facility] [place] [phone]   07/19/2013 6:45 AM [facility] [place] [phone]   07/20/2013 6:45 AM [facility] [place] [phone]   07/25/2013 6:45 AM [facility] [place] [phone]   07/26/2013 6:45 AM [facility] [place] [phone]   07/27/2013 6:45 AM [facility] [place] [phone]   08/01/2013 6:45 AM [facility] [place] [phone]   08/02/2013 6:45 AM [facility] [place] [phone]   08/03/2013 6:45 AM [facility] [place] [phone]   08/03/2013 2:30 PM Casandra Doffing, [place] [phone]   08/08/2013 6:45 AM [facility] [place] [phone]   08/09/2013 6:45 AM [facility] [place] [phone]   08/10/2013 6:45 AM [facility] [place] [phone]   08/15/2013 6:45 AM [facility] [place] [phone]   08/16/2013 6:45 AM [facility] [place] [phone]   08/16/2013 1:30 PM Casandra Doffing, MD [place] [phone]   08/17/2013 6:45 AM [facility] [place] [phone]       Medication List    STOP taking these medications       aspirin 81 MG tablet     multivitamins with iron Tabs tablet     warfarin 5 MG tablet  Commonly known as:  COUMADIN      TAKE these medications       albuterol 108 (90 BASE) MCG/ACT inhaler  Commonly known as:  PROVENTIL HFA;VENTOLIN HFA  Inhale 2 puffs into the lungs every 6 (six) hours as needed for wheezing or shortness of breath.     atorvastatin 40 MG tablet  Commonly known as:  LIPITOR  Take 40 mg by mouth daily at 6 PM.     citric acid-sodium citrate 334-500 MG/5ML solution  Commonly known as:  ORACIT  Take 15 mLs by mouth every morning. 1 Tablespoon Daily     colchicine 0.6 MG tablet  Take 0.6 mg by mouth daily as needed (for gout).     febuxostat 40 MG tablet  Commonly known as:  ULORIC  Take 40 mg by mouth every morning.     ferrous sulfate 325 (65 FE) MG EC tablet  Take 1 tablet (325 mg total) by mouth daily with breakfast.     furosemide 40 MG tablet  Commonly known as:  LASIX  Take 40 mg by mouth every morning.     metolazone 2.5 MG tablet  Commonly known as:  ZAROXOLYN  Take 2.5 mg by mouth daily as needed (for fluid).     metoprolol tartrate 25 MG tablet  Commonly known as:  LOPRESSOR  Take 1 tablet (25 mg total) by mouth 2 (two) times daily.     TH FAMOTIDINE 10 10 MG tablet  Generic drug:  famotidine  Take 10 mg by mouth every morning.     VITAMIN B-12 IJ  Inject 1 each as directed every 30 (thirty) days.           Follow-up Information   Follow up with Horton Finer, MD. (We will call you with appt to check  blood work next week)    Specialty:  Internal Medicine   Contact information:   [address], [address] [place] [address] [phone]       Follow up with Jettie Booze., MD On 08/03/2013. (at 2:30 pm)    Specialty:  Cardiology   Contact information:   [address] [place] [address] [phone]       Follow up with Horton Finer, MD In 2 weeks.   Specialty:  Internal Medicine   Contact information:   [address], [address] [place] [address] [phone]       Follow up with [facility]. (home health physical therapy)    Contact information:   [address] [place] [address] [phone]       Time coordinating discharge: 25 minutes including medication reconciliation,  preparation of discharge papers, and discussion with patient     Signed:  OSBORNE,JAMES CHARLES 07/10/2013, 7:17 AM

## 2013-07-11 ENCOUNTER — Encounter (HOSPITAL_COMMUNITY): Payer: Medicare Other

## 2013-07-12 ENCOUNTER — Encounter (HOSPITAL_COMMUNITY): Payer: Medicare Other

## 2013-07-13 ENCOUNTER — Encounter (HOSPITAL_COMMUNITY): Payer: Medicare Other

## 2013-07-18 ENCOUNTER — Encounter (HOSPITAL_COMMUNITY): Payer: Medicare Other

## 2013-07-19 ENCOUNTER — Encounter (HOSPITAL_COMMUNITY): Payer: Medicare Other

## 2013-07-20 ENCOUNTER — Encounter (HOSPITAL_COMMUNITY): Payer: Medicare Other

## 2013-07-25 ENCOUNTER — Encounter (HOSPITAL_COMMUNITY): Payer: Medicare Other

## 2013-07-26 ENCOUNTER — Encounter (HOSPITAL_COMMUNITY): Payer: Medicare Other

## 2013-07-27 ENCOUNTER — Encounter (HOSPITAL_COMMUNITY): Payer: Medicare Other

## 2013-08-01 ENCOUNTER — Encounter (HOSPITAL_COMMUNITY): Payer: Medicare Other

## 2013-08-02 ENCOUNTER — Ambulatory Visit (INDEPENDENT_AMBULATORY_CARE_PROVIDER_SITE_OTHER): Payer: Medicare Other | Admitting: Interventional Cardiology

## 2013-08-02 ENCOUNTER — Encounter (HOSPITAL_COMMUNITY): Payer: Medicare Other

## 2013-08-02 ENCOUNTER — Encounter: Payer: Self-pay | Admitting: Interventional Cardiology

## 2013-08-02 VITALS — BP 118/52 | HR 84 | Ht 68.0 in | Wt 171.1 lb

## 2013-08-02 DIAGNOSIS — I4891 Unspecified atrial fibrillation: Secondary | ICD-10-CM

## 2013-08-02 DIAGNOSIS — I359 Nonrheumatic aortic valve disorder, unspecified: Secondary | ICD-10-CM

## 2013-08-02 DIAGNOSIS — K922 Gastrointestinal hemorrhage, unspecified: Secondary | ICD-10-CM

## 2013-08-02 DIAGNOSIS — I2510 Atherosclerotic heart disease of native coronary artery without angina pectoris: Secondary | ICD-10-CM

## 2013-08-02 DIAGNOSIS — D500 Iron deficiency anemia secondary to blood loss (chronic): Secondary | ICD-10-CM

## 2013-08-02 NOTE — Progress Notes (Signed)
Patient ID: [name], male   DOB: [birth year], 80 y.o.   MRN: [id number] Patient ID: [name], male   DOB: 10/29/1930, 80 y.o.   MRN: [id number]    [place], [address] [place], [place]  [address] Phone: [phone] Fax:  [phone]  Date:  08/02/2013   ID:  [name], DOB [birth year], MRN [id number]  PCP:  Parker Finer, MD      History of Present Illness: [name] is a 80 y.o. male who had TAVR done in 2013, with a h/o CAD. His breathing is getting better after his hospitalization for severe anemia. GI workup was negative.  He is not doing cardiac rehab. He is using an inhaler. MRI showed spinal stenosis. He has had gout as well in his feet. He is now treated for OSA.  Using Metolazone when he feels fluid overload.  He weighs himself and fluid overload corresponds to an increase in weight by 3 lbs.  CAD/ASCVD:  Not Exercising at cardiac rehab. No chest pain or SHOB. Denies : Chest pain.  Dyspnea on exertion.  Improving Leg edema.  No problems with using exercise bike.  Strength getting better.  Labs to be checked in a week.     Wt Readings from Last 3 Encounters:  08/02/13 171 lb 1.9 oz (77.62 kg)  07/06/13 186 lb 9.6 oz (84.641 kg)  07/06/13 186 lb 9.6 oz (84.641 kg)     Past Medical History  Diagnosis Date  . Aortic valve disorder     s/p [facility]R at [facility]   . PVD (peripheral vascular disease)   . Hypercholesteremia   . HTN (hypertension)   . Atrial fibrillation     Chronic  . Atrial flutter   . CKD (chronic kidney disease)   . AAA (abdominal aortic aneurysm)   . CAD (coronary artery disease)     Status post CABG, stent to diagonal prior to TAVR  . Bladder cancer   . Gallstone   . Vitamin B12 deficiency   . Colon cancer   . Carotid stenosis   . Gout     Current Outpatient Prescriptions  Medication Sig Dispense Refill  . albuterol (PROVENTIL HFA;VENTOLIN HFA) 108 (90 BASE) MCG/ACT inhaler Inhale 2 puffs into the lungs every 6  (six) hours as needed for wheezing or shortness of breath.      Marland Kitchen atorvastatin (LIPITOR) 40 MG tablet Take 40 mg by mouth daily at 6 PM.       . citric acid-sodium citrate (ORACIT) 334-500 MG/5ML solution Take 15 mLs by mouth every morning. 1 Tablespoon Daily      . colchicine 0.6 MG tablet Take 0.6 mg by mouth daily as needed (for gout).      . Cyanocobalamin (VITAMIN B-12 IJ) Inject 1 each as directed every 30 (thirty) days.      . famotidine (TH FAMOTIDINE 10) 10 MG tablet Take 10 mg by mouth every morning.      . febuxostat (ULORIC) 40 MG tablet Take 40 mg by mouth every morning.      . ferrous sulfate 325 (65 FE) MG EC tablet Take 1 tablet (325 mg total) by mouth daily with breakfast.  30 tablet    . furosemide (LASIX) 40 MG tablet Take 40 mg by mouth every morning.      . metolazone (ZAROXOLYN) 2.5 MG tablet Take 2.5 mg by mouth daily as needed (for fluid).      . metoprolol tartrate (LOPRESSOR)  25 MG tablet Take 1 tablet (25 mg total) by mouth 2 (two) times daily.       No current facility-administered medications for this visit.    Allergies:    Allergies  Allergen Reactions  . Ramipril Other (See Comments)    unknown    Social History:  The patient  reports that he has quit smoking. He has never used smokeless tobacco. He reports that he drinks alcohol. He reports that he does not use illicit drugs.   Family History:  The patient's family history includes Cancer in his brother and mother; Diabetes in his father; Heart disease in his father.   ROS:  Please see the history of present illness.  No nausea, vomiting.  No fevers, chills.  No focal weakness.  No dysuria. Mild unsteadiness, uses cane   All other systems reviewed and negative.   PHYSICAL EXAM: VS:  BP 118/52  Pulse 84  Ht 5\' 8"  (1.727 m)  Wt 171 lb 1.9 oz (77.62 kg)  BMI 26.02 kg/m2 Well nourished, well developed, in no acute distress HEENT: normal Neck: no JVD, no carotid bruits Cardiac:  normal S1, S2;  irregularly irregular; 2/6 systolic murmur Lungs:  Decreased , no wheezing, rhonchi or rales Abd: soft, nontender, no hepatomegaly Ext: no edema Skin: warm and dry Neuro:   no focal abnormalities noted  EKG:  A. fib, rate controlled, right bundle branch block     ASSESSMENT AND PLAN:  Aortic valve disorders  Notes: status post TAVR at [facility] several years. He will followup there in a few weeks. Some diastolic dysfunction as well.   2. Peripheral Vascular Disease  Notes: he has some symptoms of claudication but also has symptoms of nonvascular leg pain. He also has issues with spinal stenosis and arthritis. Given his renal insufficiency, I don't think it's worthwhile trying to revascularize his lower extremity vasculature. I think he would likely still have discomfort since much of his pain sounds nonvascular.  3. Essential hypertension, benign  Continue Metoprolol Tartrate Tablet, 25 MG, 1 tablet, Orally, Twice a day Notes: controlled.  4. Coronary atherosclerosis of native coronary artery  Notes: no angina. Status post diagonal stent at the time of TAVR.  Status post CABG.  5. anemia/atrial fibrillation: Given his GI bleed, the risks of anticoagulation outweigh the benefits. We'll hold all anticoagulation at this point. Hopefully, his hemoglobin will improve and his dyspnea on exertion will also improve. His energy level should get better as well. Hospital records reviewed. Adjust diuretic therapy based on renal function.    Signed, Mina Marble, MD, [facility] 08/02/2013 9:21 AM

## 2013-08-02 NOTE — Patient Instructions (Signed)
Your physician recommends that you continue on your current medications as directed. Please refer to the Current Medication list given to you today.  Your physician wants you to follow-up in: 6 months with Dr. Varanasi.  You will receive a reminder letter in the mail two months in advance. If you don't receive a letter, please call our office to schedule the follow-up appointment.  

## 2013-08-03 ENCOUNTER — Ambulatory Visit: Payer: Medicare Other | Admitting: Interventional Cardiology

## 2013-08-03 ENCOUNTER — Encounter (HOSPITAL_COMMUNITY): Payer: Medicare Other

## 2013-08-08 ENCOUNTER — Encounter (HOSPITAL_COMMUNITY): Payer: Medicare Other

## 2013-08-09 ENCOUNTER — Encounter (HOSPITAL_COMMUNITY): Payer: Medicare Other

## 2013-08-10 ENCOUNTER — Encounter (HOSPITAL_COMMUNITY): Payer: Medicare Other

## 2013-08-15 ENCOUNTER — Encounter (HOSPITAL_COMMUNITY): Payer: Medicare Other

## 2013-08-16 ENCOUNTER — Ambulatory Visit: Payer: Medicare Other | Admitting: Interventional Cardiology

## 2013-08-16 ENCOUNTER — Encounter (HOSPITAL_COMMUNITY): Payer: Medicare Other

## 2013-08-17 ENCOUNTER — Encounter (HOSPITAL_COMMUNITY): Payer: Medicare Other

## 2013-09-05 ENCOUNTER — Encounter (HOSPITAL_COMMUNITY)
Admission: RE | Admit: 2013-09-05 | Discharge: 2013-09-05 | Disposition: A | Discharge status: Home / Self Care | Payer: Self-pay | Source: Ambulatory Visit | Attending: Interventional Cardiology | Admitting: Interventional Cardiology

## 2013-09-05 DIAGNOSIS — I2510 Atherosclerotic heart disease of native coronary artery without angina pectoris: Secondary | ICD-10-CM | POA: Insufficient documentation

## 2013-09-05 DIAGNOSIS — I359 Nonrheumatic aortic valve disorder, unspecified: Secondary | ICD-10-CM | POA: Insufficient documentation

## 2013-09-05 DIAGNOSIS — I6529 Occlusion and stenosis of unspecified carotid artery: Secondary | ICD-10-CM | POA: Insufficient documentation

## 2013-09-05 DIAGNOSIS — I714 Abdominal aortic aneurysm, without rupture, unspecified: Secondary | ICD-10-CM | POA: Insufficient documentation

## 2013-09-05 DIAGNOSIS — I4891 Unspecified atrial fibrillation: Secondary | ICD-10-CM | POA: Insufficient documentation

## 2013-09-05 DIAGNOSIS — I739 Peripheral vascular disease, unspecified: Secondary | ICD-10-CM | POA: Insufficient documentation

## 2013-09-05 DIAGNOSIS — Z5189 Encounter for other specified aftercare: Secondary | ICD-10-CM | POA: Insufficient documentation

## 2013-09-05 DIAGNOSIS — I10 Essential (primary) hypertension: Secondary | ICD-10-CM | POA: Insufficient documentation

## 2013-09-06 ENCOUNTER — Encounter (HOSPITAL_COMMUNITY)
Admission: RE | Admit: 2013-09-06 | Discharge: 2013-09-06 | Disposition: A | Discharge status: Home / Self Care | Payer: Self-pay | Source: Ambulatory Visit | Attending: Interventional Cardiology | Admitting: Interventional Cardiology

## 2013-09-07 ENCOUNTER — Encounter (HOSPITAL_COMMUNITY): Payer: Self-pay

## 2013-09-12 ENCOUNTER — Encounter (HOSPITAL_COMMUNITY)
Admission: RE | Admit: 2013-09-12 | Discharge: 2013-09-12 | Disposition: A | Discharge status: Home / Self Care | Payer: Self-pay | Source: Ambulatory Visit | Attending: Interventional Cardiology | Admitting: Interventional Cardiology

## 2013-09-13 ENCOUNTER — Encounter (HOSPITAL_COMMUNITY): Payer: Self-pay

## 2013-09-14 ENCOUNTER — Encounter (HOSPITAL_COMMUNITY)
Admission: RE | Admit: 2013-09-14 | Discharge: 2013-09-14 | Disposition: A | Discharge status: Home / Self Care | Payer: Self-pay | Source: Ambulatory Visit | Attending: Interventional Cardiology | Admitting: Interventional Cardiology

## 2013-09-19 ENCOUNTER — Encounter (HOSPITAL_COMMUNITY)
Admission: RE | Admit: 2013-09-19 | Discharge: 2013-09-19 | Disposition: A | Discharge status: Home / Self Care | Payer: Self-pay | Source: Ambulatory Visit | Attending: Interventional Cardiology | Admitting: Interventional Cardiology

## 2013-09-19 DIAGNOSIS — I2510 Atherosclerotic heart disease of native coronary artery without angina pectoris: Secondary | ICD-10-CM | POA: Insufficient documentation

## 2013-09-19 DIAGNOSIS — I10 Essential (primary) hypertension: Secondary | ICD-10-CM | POA: Insufficient documentation

## 2013-09-19 DIAGNOSIS — Z954 Presence of other heart-valve replacement: Secondary | ICD-10-CM | POA: Insufficient documentation

## 2013-09-19 DIAGNOSIS — Z5189 Encounter for other specified aftercare: Secondary | ICD-10-CM | POA: Insufficient documentation

## 2013-09-19 DIAGNOSIS — I4891 Unspecified atrial fibrillation: Secondary | ICD-10-CM | POA: Insufficient documentation

## 2013-09-20 ENCOUNTER — Encounter (HOSPITAL_COMMUNITY)
Admission: RE | Admit: 2013-09-20 | Discharge: 2013-09-20 | Disposition: A | Discharge status: Home / Self Care | Payer: Self-pay | Source: Ambulatory Visit | Attending: Interventional Cardiology | Admitting: Interventional Cardiology

## 2013-09-21 ENCOUNTER — Encounter (HOSPITAL_COMMUNITY)
Admission: RE | Admit: 2013-09-21 | Discharge: 2013-09-21 | Disposition: A | Discharge status: Home / Self Care | Payer: Self-pay | Source: Ambulatory Visit | Attending: Interventional Cardiology | Admitting: Interventional Cardiology

## 2013-09-26 ENCOUNTER — Encounter (HOSPITAL_COMMUNITY)
Admission: RE | Admit: 2013-09-26 | Discharge: 2013-09-26 | Disposition: A | Discharge status: Home / Self Care | Payer: Self-pay | Source: Ambulatory Visit | Attending: Interventional Cardiology | Admitting: Interventional Cardiology

## 2013-09-27 ENCOUNTER — Encounter (HOSPITAL_COMMUNITY)
Admission: RE | Admit: 2013-09-27 | Discharge: 2013-09-27 | Disposition: A | Discharge status: Home / Self Care | Payer: Self-pay | Source: Ambulatory Visit | Attending: Interventional Cardiology | Admitting: Interventional Cardiology

## 2013-09-28 ENCOUNTER — Encounter (HOSPITAL_COMMUNITY): Payer: Self-pay

## 2013-10-03 ENCOUNTER — Encounter (HOSPITAL_COMMUNITY): Payer: Self-pay

## 2013-10-04 ENCOUNTER — Encounter (HOSPITAL_COMMUNITY): Payer: Self-pay

## 2013-10-05 ENCOUNTER — Encounter (HOSPITAL_COMMUNITY): Payer: Self-pay

## 2013-10-10 ENCOUNTER — Encounter (HOSPITAL_COMMUNITY): Payer: Self-pay

## 2013-10-11 ENCOUNTER — Encounter (HOSPITAL_COMMUNITY): Payer: Self-pay

## 2013-10-12 ENCOUNTER — Encounter (HOSPITAL_COMMUNITY): Payer: Self-pay

## 2013-10-15 ENCOUNTER — Other Ambulatory Visit: Payer: Self-pay | Admitting: Interventional Cardiology

## 2013-10-17 ENCOUNTER — Encounter (HOSPITAL_COMMUNITY)
Admission: RE | Admit: 2013-10-17 | Discharge: 2013-10-17 | Disposition: A | Discharge status: Home / Self Care | Payer: Self-pay | Source: Ambulatory Visit | Attending: Interventional Cardiology | Admitting: Interventional Cardiology

## 2013-10-18 ENCOUNTER — Encounter (HOSPITAL_COMMUNITY): Payer: Self-pay

## 2013-10-18 DIAGNOSIS — Z954 Presence of other heart-valve replacement: Secondary | ICD-10-CM | POA: Insufficient documentation

## 2013-10-18 DIAGNOSIS — Z5189 Encounter for other specified aftercare: Secondary | ICD-10-CM | POA: Insufficient documentation

## 2013-10-18 DIAGNOSIS — I2510 Atherosclerotic heart disease of native coronary artery without angina pectoris: Secondary | ICD-10-CM | POA: Insufficient documentation

## 2013-10-18 DIAGNOSIS — I4891 Unspecified atrial fibrillation: Secondary | ICD-10-CM | POA: Insufficient documentation

## 2013-10-18 DIAGNOSIS — I10 Essential (primary) hypertension: Secondary | ICD-10-CM | POA: Insufficient documentation

## 2013-10-19 ENCOUNTER — Telehealth: Payer: Self-pay | Admitting: Interventional Cardiology

## 2013-10-19 ENCOUNTER — Other Ambulatory Visit: Payer: Self-pay

## 2013-10-19 ENCOUNTER — Encounter (HOSPITAL_COMMUNITY): Payer: Self-pay

## 2013-10-19 MED ORDER — METOPROLOL TARTRATE 25 MG PO TABS: 25.0000 mg | ORAL_TABLET | Freq: Two times a day (BID) | ORAL | Status: DC

## 2013-10-19 NOTE — Telephone Encounter (Deleted)
Error

## 2013-10-24 ENCOUNTER — Encounter (HOSPITAL_COMMUNITY): Payer: Medicare Other

## 2013-10-25 ENCOUNTER — Encounter (HOSPITAL_COMMUNITY): Payer: Self-pay

## 2013-10-26 ENCOUNTER — Encounter (HOSPITAL_COMMUNITY): Payer: Self-pay

## 2013-10-30 ENCOUNTER — Other Ambulatory Visit: Payer: Self-pay | Admitting: *Deleted

## 2013-10-30 MED ORDER — FUROSEMIDE 40 MG PO TABS: 40.0000 mg | ORAL_TABLET | Freq: Every morning | ORAL | Status: DC

## 2013-10-31 ENCOUNTER — Encounter (HOSPITAL_COMMUNITY): Payer: Self-pay

## 2013-11-01 ENCOUNTER — Encounter (HOSPITAL_COMMUNITY): Payer: Self-pay

## 2013-11-02 ENCOUNTER — Encounter (HOSPITAL_COMMUNITY): Payer: Self-pay

## 2013-11-07 ENCOUNTER — Encounter (HOSPITAL_COMMUNITY)
Admission: RE | Admit: 2013-11-07 | Discharge: 2013-11-07 | Disposition: A | Discharge status: Home / Self Care | Payer: Self-pay | Source: Ambulatory Visit | Attending: Interventional Cardiology | Admitting: Interventional Cardiology

## 2013-11-08 ENCOUNTER — Encounter (HOSPITAL_COMMUNITY)
Admission: RE | Admit: 2013-11-08 | Discharge: 2013-11-08 | Disposition: A | Discharge status: Home / Self Care | Payer: Self-pay | Source: Ambulatory Visit | Attending: Interventional Cardiology | Admitting: Interventional Cardiology

## 2013-11-09 ENCOUNTER — Encounter (HOSPITAL_COMMUNITY)
Admission: RE | Admit: 2013-11-09 | Discharge: 2013-11-09 | Disposition: A | Discharge status: Home / Self Care | Payer: Self-pay | Source: Ambulatory Visit | Attending: Interventional Cardiology | Admitting: Interventional Cardiology

## 2013-11-14 ENCOUNTER — Encounter (HOSPITAL_COMMUNITY)
Admission: RE | Admit: 2013-11-14 | Discharge: 2013-11-14 | Disposition: A | Discharge status: Home / Self Care | Payer: Self-pay | Source: Ambulatory Visit | Attending: Interventional Cardiology | Admitting: Interventional Cardiology

## 2013-11-15 ENCOUNTER — Encounter (HOSPITAL_COMMUNITY)
Admission: RE | Admit: 2013-11-15 | Discharge: 2013-11-15 | Disposition: A | Discharge status: Home / Self Care | Payer: Self-pay | Source: Ambulatory Visit | Attending: Interventional Cardiology | Admitting: Interventional Cardiology

## 2013-11-16 ENCOUNTER — Encounter (HOSPITAL_COMMUNITY)
Admission: RE | Admit: 2013-11-16 | Discharge: 2013-11-16 | Disposition: A | Discharge status: Home / Self Care | Payer: Self-pay | Source: Ambulatory Visit | Attending: Interventional Cardiology | Admitting: Interventional Cardiology

## 2013-11-20 ENCOUNTER — Other Ambulatory Visit: Payer: Self-pay | Admitting: Interventional Cardiology

## 2013-11-21 ENCOUNTER — Encounter (HOSPITAL_COMMUNITY)
Admission: RE | Admit: 2013-11-21 | Discharge: 2013-11-21 | Disposition: A | Discharge status: Home / Self Care | Payer: Self-pay | Source: Ambulatory Visit | Attending: Interventional Cardiology | Admitting: Interventional Cardiology

## 2013-11-21 DIAGNOSIS — Z5189 Encounter for other specified aftercare: Secondary | ICD-10-CM | POA: Insufficient documentation

## 2013-11-21 DIAGNOSIS — I10 Essential (primary) hypertension: Secondary | ICD-10-CM | POA: Insufficient documentation

## 2013-11-21 DIAGNOSIS — Z954 Presence of other heart-valve replacement: Secondary | ICD-10-CM | POA: Insufficient documentation

## 2013-11-21 DIAGNOSIS — I4891 Unspecified atrial fibrillation: Secondary | ICD-10-CM | POA: Insufficient documentation

## 2013-11-21 DIAGNOSIS — I2510 Atherosclerotic heart disease of native coronary artery without angina pectoris: Secondary | ICD-10-CM | POA: Insufficient documentation

## 2013-11-22 ENCOUNTER — Encounter (HOSPITAL_COMMUNITY)
Admission: RE | Admit: 2013-11-22 | Discharge: 2013-11-22 | Disposition: A | Discharge status: Home / Self Care | Payer: Self-pay | Source: Ambulatory Visit | Attending: Interventional Cardiology | Admitting: Interventional Cardiology

## 2013-11-23 ENCOUNTER — Encounter (HOSPITAL_COMMUNITY)
Admission: RE | Admit: 2013-11-23 | Discharge: 2013-11-23 | Disposition: A | Discharge status: Home / Self Care | Payer: Self-pay | Source: Ambulatory Visit | Attending: Interventional Cardiology | Admitting: Interventional Cardiology

## 2013-11-24 NOTE — Telephone Encounter (Signed)
This encounter was created in error - please disregard.

## 2013-11-28 ENCOUNTER — Encounter (HOSPITAL_COMMUNITY)
Admission: RE | Admit: 2013-11-28 | Discharge: 2013-11-28 | Disposition: A | Discharge status: Home / Self Care | Payer: Self-pay | Source: Ambulatory Visit | Attending: Interventional Cardiology | Admitting: Interventional Cardiology

## 2013-11-29 ENCOUNTER — Encounter (HOSPITAL_COMMUNITY): Payer: Self-pay

## 2013-11-30 ENCOUNTER — Encounter (HOSPITAL_COMMUNITY)
Admission: RE | Admit: 2013-11-30 | Discharge: 2013-11-30 | Disposition: A | Discharge status: Home / Self Care | Payer: Self-pay | Source: Ambulatory Visit | Attending: Interventional Cardiology | Admitting: Interventional Cardiology

## 2013-12-05 ENCOUNTER — Encounter (HOSPITAL_COMMUNITY)
Admission: RE | Admit: 2013-12-05 | Discharge: 2013-12-05 | Disposition: A | Discharge status: Home / Self Care | Payer: Self-pay | Source: Ambulatory Visit | Attending: Interventional Cardiology | Admitting: Interventional Cardiology

## 2013-12-06 ENCOUNTER — Encounter (HOSPITAL_COMMUNITY): Payer: Self-pay

## 2013-12-07 ENCOUNTER — Encounter (HOSPITAL_COMMUNITY)
Admission: RE | Admit: 2013-12-07 | Discharge: 2013-12-07 | Disposition: A | Discharge status: Home / Self Care | Payer: Self-pay | Source: Ambulatory Visit | Attending: Interventional Cardiology | Admitting: Interventional Cardiology

## 2013-12-12 ENCOUNTER — Encounter (HOSPITAL_COMMUNITY)
Admission: RE | Admit: 2013-12-12 | Discharge: 2013-12-12 | Disposition: A | Discharge status: Home / Self Care | Payer: Self-pay | Source: Ambulatory Visit | Attending: Interventional Cardiology | Admitting: Interventional Cardiology

## 2013-12-13 ENCOUNTER — Encounter (HOSPITAL_COMMUNITY)
Admission: RE | Admit: 2013-12-13 | Discharge: 2013-12-13 | Disposition: A | Discharge status: Home / Self Care | Payer: Self-pay | Source: Ambulatory Visit | Attending: Interventional Cardiology | Admitting: Interventional Cardiology

## 2013-12-14 ENCOUNTER — Encounter (HOSPITAL_COMMUNITY)
Admission: RE | Admit: 2013-12-14 | Discharge: 2013-12-14 | Disposition: A | Discharge status: Home / Self Care | Payer: Self-pay | Source: Ambulatory Visit | Attending: Interventional Cardiology | Admitting: Interventional Cardiology

## 2013-12-19 ENCOUNTER — Encounter (HOSPITAL_COMMUNITY)
Admission: RE | Admit: 2013-12-19 | Discharge: 2013-12-19 | Disposition: A | Discharge status: Home / Self Care | Payer: Self-pay | Source: Ambulatory Visit | Attending: Interventional Cardiology | Admitting: Interventional Cardiology

## 2013-12-19 DIAGNOSIS — Z954 Presence of other heart-valve replacement: Secondary | ICD-10-CM | POA: Insufficient documentation

## 2013-12-19 DIAGNOSIS — Z5189 Encounter for other specified aftercare: Secondary | ICD-10-CM | POA: Insufficient documentation

## 2013-12-19 DIAGNOSIS — I4891 Unspecified atrial fibrillation: Secondary | ICD-10-CM | POA: Insufficient documentation

## 2013-12-19 DIAGNOSIS — I2510 Atherosclerotic heart disease of native coronary artery without angina pectoris: Secondary | ICD-10-CM | POA: Insufficient documentation

## 2013-12-19 DIAGNOSIS — I10 Essential (primary) hypertension: Secondary | ICD-10-CM | POA: Insufficient documentation

## 2013-12-20 ENCOUNTER — Encounter (HOSPITAL_COMMUNITY)
Admission: RE | Admit: 2013-12-20 | Discharge: 2013-12-20 | Disposition: A | Discharge status: Home / Self Care | Payer: Self-pay | Source: Ambulatory Visit | Attending: Interventional Cardiology | Admitting: Interventional Cardiology

## 2013-12-21 ENCOUNTER — Encounter (HOSPITAL_COMMUNITY)
Admission: RE | Admit: 2013-12-21 | Discharge: 2013-12-21 | Disposition: A | Discharge status: Home / Self Care | Payer: Self-pay | Source: Ambulatory Visit | Attending: Interventional Cardiology | Admitting: Interventional Cardiology

## 2013-12-26 ENCOUNTER — Encounter (HOSPITAL_COMMUNITY): Payer: Self-pay

## 2013-12-27 ENCOUNTER — Encounter (HOSPITAL_COMMUNITY)
Admission: RE | Admit: 2013-12-27 | Discharge: 2013-12-27 | Disposition: A | Discharge status: Home / Self Care | Payer: Self-pay | Source: Ambulatory Visit | Attending: Interventional Cardiology | Admitting: Interventional Cardiology

## 2013-12-28 ENCOUNTER — Encounter (HOSPITAL_COMMUNITY): Payer: Self-pay

## 2014-01-02 ENCOUNTER — Encounter (HOSPITAL_COMMUNITY)
Admission: RE | Admit: 2014-01-02 | Discharge: 2014-01-02 | Disposition: A | Discharge status: Home / Self Care | Payer: Self-pay | Source: Ambulatory Visit | Attending: Interventional Cardiology | Admitting: Interventional Cardiology

## 2014-01-03 ENCOUNTER — Encounter (HOSPITAL_COMMUNITY)
Admission: RE | Admit: 2014-01-03 | Discharge: 2014-01-03 | Disposition: A | Discharge status: Home / Self Care | Payer: Self-pay | Source: Ambulatory Visit | Attending: Interventional Cardiology | Admitting: Interventional Cardiology

## 2014-01-04 ENCOUNTER — Encounter (HOSPITAL_COMMUNITY)
Admission: RE | Admit: 2014-01-04 | Discharge: 2014-01-04 | Disposition: A | Discharge status: Home / Self Care | Payer: Self-pay | Source: Ambulatory Visit | Attending: Interventional Cardiology | Admitting: Interventional Cardiology

## 2014-01-09 ENCOUNTER — Encounter (HOSPITAL_COMMUNITY)
Admission: RE | Admit: 2014-01-09 | Discharge: 2014-01-09 | Disposition: A | Discharge status: Home / Self Care | Payer: Self-pay | Source: Ambulatory Visit | Attending: Interventional Cardiology | Admitting: Interventional Cardiology

## 2014-01-10 ENCOUNTER — Encounter (HOSPITAL_COMMUNITY): Payer: Self-pay

## 2014-01-11 ENCOUNTER — Encounter (HOSPITAL_COMMUNITY)
Admission: RE | Admit: 2014-01-11 | Discharge: 2014-01-11 | Disposition: A | Discharge status: Home / Self Care | Payer: Self-pay | Source: Ambulatory Visit | Attending: Interventional Cardiology | Admitting: Interventional Cardiology

## 2014-01-16 ENCOUNTER — Encounter (HOSPITAL_COMMUNITY)
Admission: RE | Admit: 2014-01-16 | Discharge: 2014-01-16 | Disposition: A | Discharge status: Home / Self Care | Payer: Self-pay | Source: Ambulatory Visit | Attending: Interventional Cardiology | Admitting: Interventional Cardiology

## 2014-01-17 ENCOUNTER — Encounter (HOSPITAL_COMMUNITY)
Admission: RE | Admit: 2014-01-17 | Discharge: 2014-01-17 | Disposition: A | Discharge status: Home / Self Care | Payer: Self-pay | Source: Ambulatory Visit | Attending: Interventional Cardiology | Admitting: Interventional Cardiology

## 2014-01-18 ENCOUNTER — Encounter (HOSPITAL_COMMUNITY)
Admission: RE | Admit: 2014-01-18 | Discharge: 2014-01-18 | Disposition: A | Discharge status: Home / Self Care | Payer: Self-pay | Source: Ambulatory Visit | Attending: Interventional Cardiology | Admitting: Interventional Cardiology

## 2014-01-18 ENCOUNTER — Other Ambulatory Visit: Payer: Self-pay | Admitting: Interventional Cardiology

## 2014-01-18 DIAGNOSIS — Z952 Presence of prosthetic heart valve: Secondary | ICD-10-CM | POA: Insufficient documentation

## 2014-01-18 DIAGNOSIS — I2510 Atherosclerotic heart disease of native coronary artery without angina pectoris: Secondary | ICD-10-CM | POA: Insufficient documentation

## 2014-01-18 DIAGNOSIS — I10 Essential (primary) hypertension: Secondary | ICD-10-CM | POA: Insufficient documentation

## 2014-01-18 DIAGNOSIS — Z5189 Encounter for other specified aftercare: Secondary | ICD-10-CM | POA: Insufficient documentation

## 2014-01-18 DIAGNOSIS — I4891 Unspecified atrial fibrillation: Secondary | ICD-10-CM | POA: Insufficient documentation

## 2014-01-23 ENCOUNTER — Encounter (HOSPITAL_COMMUNITY)
Admission: RE | Admit: 2014-01-23 | Discharge: 2014-01-23 | Disposition: A | Discharge status: Home / Self Care | Payer: Self-pay | Source: Ambulatory Visit | Attending: Interventional Cardiology | Admitting: Interventional Cardiology

## 2014-01-24 ENCOUNTER — Other Ambulatory Visit: Payer: Self-pay | Admitting: Interventional Cardiology

## 2014-01-24 ENCOUNTER — Encounter (HOSPITAL_COMMUNITY): Payer: Self-pay

## 2014-01-25 ENCOUNTER — Encounter (HOSPITAL_COMMUNITY)
Admission: RE | Admit: 2014-01-25 | Discharge: 2014-01-25 | Disposition: A | Discharge status: Home / Self Care | Payer: Self-pay | Source: Ambulatory Visit | Attending: Interventional Cardiology | Admitting: Interventional Cardiology

## 2014-01-30 ENCOUNTER — Encounter (HOSPITAL_COMMUNITY)
Admission: RE | Admit: 2014-01-30 | Discharge: 2014-01-30 | Disposition: A | Discharge status: Home / Self Care | Payer: Self-pay | Source: Ambulatory Visit | Attending: Interventional Cardiology | Admitting: Interventional Cardiology

## 2014-01-30 NOTE — Telephone Encounter (Signed)
error 

## 2014-01-31 ENCOUNTER — Encounter (HOSPITAL_COMMUNITY)
Admission: RE | Admit: 2014-01-31 | Discharge: 2014-01-31 | Disposition: A | Discharge status: Home / Self Care | Payer: Self-pay | Source: Ambulatory Visit | Attending: Interventional Cardiology | Admitting: Interventional Cardiology

## 2014-02-01 ENCOUNTER — Encounter (HOSPITAL_COMMUNITY): Payer: Self-pay

## 2014-02-02 ENCOUNTER — Other Ambulatory Visit: Payer: Self-pay

## 2014-02-06 ENCOUNTER — Encounter (HOSPITAL_COMMUNITY)
Admission: RE | Admit: 2014-02-06 | Discharge: 2014-02-06 | Disposition: A | Discharge status: Home / Self Care | Payer: Self-pay | Source: Ambulatory Visit | Attending: Interventional Cardiology | Admitting: Interventional Cardiology

## 2014-02-07 ENCOUNTER — Encounter (HOSPITAL_COMMUNITY): Payer: Self-pay

## 2014-02-08 ENCOUNTER — Encounter (HOSPITAL_COMMUNITY): Payer: Self-pay

## 2014-02-13 ENCOUNTER — Encounter (HOSPITAL_COMMUNITY): Payer: Self-pay

## 2014-02-14 ENCOUNTER — Encounter (HOSPITAL_COMMUNITY): Payer: Self-pay

## 2014-02-14 ENCOUNTER — Ambulatory Visit (INDEPENDENT_AMBULATORY_CARE_PROVIDER_SITE_OTHER): Payer: Medicare Other | Admitting: Interventional Cardiology

## 2014-02-14 ENCOUNTER — Encounter: Payer: Self-pay | Admitting: Interventional Cardiology

## 2014-02-14 VITALS — BP 120/68 | HR 65 | Ht 69.0 in | Wt 167.1 lb

## 2014-02-14 DIAGNOSIS — I481 Persistent atrial fibrillation: Secondary | ICD-10-CM

## 2014-02-14 DIAGNOSIS — I6529 Occlusion and stenosis of unspecified carotid artery: Secondary | ICD-10-CM

## 2014-02-14 DIAGNOSIS — I2510 Atherosclerotic heart disease of native coronary artery without angina pectoris: Secondary | ICD-10-CM

## 2014-02-14 DIAGNOSIS — R05 Cough: Secondary | ICD-10-CM

## 2014-02-14 DIAGNOSIS — I739 Peripheral vascular disease, unspecified: Secondary | ICD-10-CM

## 2014-02-14 DIAGNOSIS — I4819 Other persistent atrial fibrillation: Secondary | ICD-10-CM

## 2014-02-14 DIAGNOSIS — R059 Cough, unspecified: Secondary | ICD-10-CM

## 2014-02-14 DIAGNOSIS — I359 Nonrheumatic aortic valve disorder, unspecified: Secondary | ICD-10-CM

## 2014-02-14 NOTE — Progress Notes (Signed)
Patient ID: [name], male   DOB: [birth year], 80 y.o.   MRN: [id number]    [place], [place], [place]  [address] Phone: [phone] Fax:  [phone]  Date:  02/14/2014   ID:  YUVRAJ PFEIFER, DOB 26-Jan-1931, MRN [id number]  PCP:  Horton Finer, MD      History of Present Illness: [name] is a 80 y.o. male who had TAVR done in 2013, with a h/o CAD. His breathing is getting better after his hospitalization for severe anemia in 3/15. GI workup was negative.  He is not doing cardiac rehab. He is using an inhaler. MRI showed spinal stenosis. He has had gout as well in his feet. He is now treated for OSA.  Using Metolazone when he feels fluid overload.  He weighs himself and fluid overload corresponds to an increase in weight by 3 lbs.  CAD/ASCVD:  Not Exercising at cardiac rehab. No chest pain or SHOB. Denies : Chest pain.  Dyspnea on exertion.  Improving Leg edema.  No problems with using exercise bike.  Strength getting better.  New cough onset.  He notes some unsteadiness and visual changes (blurry).  He has been off of Coumadin since the anemia.      Wt Readings from Last 3 Encounters:  02/14/14 167 lb 1.9 oz (75.805 kg)  08/02/13 171 lb 1.9 oz (77.62 kg)  07/06/13 186 lb 9.6 oz (84.641 kg)     Past Medical History  Diagnosis Date  . Aortic valve disorder     s/p TAVR at [facility]   . PVD (peripheral vascular disease)   . Hypercholesteremia   . HTN (hypertension)   . Atrial fibrillation     Chronic  . Atrial flutter   . CKD (chronic kidney disease)   . AAA (abdominal aortic aneurysm)   . CAD (coronary artery disease)     Status post CABG, stent to diagonal prior to TAVR  . Bladder cancer   . Gallstone   . Vitamin B12 deficiency   . Colon cancer   . Carotid stenosis   . Gout     Current Outpatient Prescriptions  Medication Sig Dispense Refill  . albuterol (PROVENTIL HFA;VENTOLIN HFA) 108 (90 BASE) MCG/ACT inhaler Inhale 2 puffs  into the lungs every 6 (six) hours as needed for wheezing or shortness of breath.      Marland Kitchen atorvastatin (LIPITOR) 40 MG tablet Take 40 mg by mouth daily at 6 PM.       . citric acid-sodium citrate (ORACIT) 334-500 MG/5ML solution Take 15 mLs by mouth every morning. 1 Tablespoon Daily      . Cyanocobalamin (VITAMIN B-12 IJ) Inject 1 each as directed every 30 (thirty) days.      . famotidine (TH FAMOTIDINE 10) 10 MG tablet Take 10 mg by mouth every morning.      . febuxostat (ULORIC) 40 MG tablet Take 40 mg by mouth every morning.      . ferrous sulfate 325 (65 FE) MG EC tablet Take 1 tablet (325 mg total) by mouth daily with breakfast.  30 tablet    . furosemide (LASIX) 40 MG tablet Take 1 tablet by mouth  daily  30 tablet  1  . metolazone (ZAROXOLYN) 2.5 MG tablet Take 2.5 mg by mouth daily as needed (for fluid).      . metoprolol tartrate (LOPRESSOR) 25 MG tablet Take 1 tablet (25 mg total) by mouth 2 (two) times daily.  [place]  tablet  0  . metoprolol tartrate (LOPRESSOR) 25 MG tablet Take 1 tablet by mouth two  times daily  180 tablet  0  . predniSONE (DELTASONE) 10 MG tablet Take 10 mg by mouth daily as needed (For gout).      . colchicine 0.6 MG tablet Take 0.6 mg by mouth daily as needed (for gout).       No current facility-administered medications for this visit.    Allergies:    Allergies  Allergen Reactions  . Ramipril Other (See Comments)    unknown unknown    Social History:  The patient  reports that he has quit smoking. He has never used smokeless tobacco. He reports that he drinks alcohol. He reports that he does not use illicit drugs.   Family History:  The patient's family history includes Cancer in his brother and mother; Diabetes in his father; Heart disease in his father.   ROS:  Please see the history of present illness.  No nausea, vomiting.  No fevers, chills.  No focal weakness.  No dysuria. Mild unsteadiness, uses cane   All other systems reviewed and negative.    PHYSICAL EXAM: VS:  BP 120/68  Pulse 65  Ht 5\' 9"  (1.753 m)  Wt 167 lb 1.9 oz (75.805 kg)  BMI 24.67 kg/m2 Well nourished, well developed, cough present HEENT: normal Neck: no JVD, no carotid bruits Cardiac:  normal S1, S2; irregularly irregular; 2/6 systolic murmur Lungs:  Left lower lobe crackles Abd: soft, nontender, no hepatomegaly Ext: no edema Skin: warm and dry Neuro:   no focal abnormalities noted  EKG:  A. fib, rate controlled, right bundle branch block     ASSESSMENT AND PLAN:  Aortic valve disorders  Notes: status post TAVR at [facility] several years. He will followup there regularly. Some diastolic dysfunction as well.   2. Peripheral Vascular Disease  Notes: he has some symptoms of claudication but also has symptoms of nonvascular leg pain. He also has issues with spinal stenosis and arthritis. Given his renal insufficiency, I don't think it's worthwhile trying to revascularize his lower extremity vasculature. I think he would likely still have discomfort since much of his pain sounds nonvascular.  Claudication improved with cardiac rehab. ; Also with carotid disease-plan Doppler 3. Essential hypertension, benign  Continue Metoprolol Tartrate Tablet, 25 MG, 1 tablet, Orally, Twice a day Notes: controlled.  4. Coronary atherosclerosis of native coronary artery  Notes: no angina. Status post diagonal stent at the time of TAVR.  Status post CABG.  5. anemia/atrial fibrillation: Given his anemia, the risks of anticoagulation outweigh the benefits. We'll hold all anticoagulation at this point. Hopefully, his hemoglobin will improve and his dyspnea on exertion will also improve. His energy level should get better as well. Hospital records reviewed. Adjust diuretic therapy based on renal function.   Cough: Plan for chest xray given lung findings.  If PNA found, will discuss with PMD.   Signed, Mina Marble, MD, [facility] 02/14/2014 9:27 AM

## 2014-02-14 NOTE — Patient Instructions (Addendum)
A chest x-ray takes a picture of the organs and structures inside the chest, including the heart, lungs, and blood vessels. This test can show several things, including, whether the heart is enlarges; whether fluid is building up in the lungs; and whether pacemaker / defibrillator leads are still in place.  **the order has been placed for you to have this done at [facility] Location- [address] **   Your physician has requested that you have a carotid duplex. This test is an ultrasound of the carotid arteries in your neck. It looks at blood flow through these arteries that supply the brain with blood. Allow one hour for this exam. There are no restrictions or special instructions.  Your physician wants you to follow-up in: 6 months with Dr. Irish Lack. You will receive a reminder letter in the mail two months in advance. If you don't receive a letter, please call our office to schedule the follow-up appointment.

## 2014-02-15 ENCOUNTER — Ambulatory Visit
Admission: RE | Admit: 2014-02-15 | Discharge: 2014-02-15 | Disposition: A | Discharge status: Home / Self Care | Payer: Medicare Other | Source: Ambulatory Visit | Attending: Interventional Cardiology | Admitting: Interventional Cardiology

## 2014-02-15 ENCOUNTER — Encounter (HOSPITAL_COMMUNITY): Payer: Self-pay

## 2014-02-15 DIAGNOSIS — R05 Cough: Secondary | ICD-10-CM

## 2014-02-15 DIAGNOSIS — R059 Cough, unspecified: Secondary | ICD-10-CM

## 2014-02-20 ENCOUNTER — Encounter (HOSPITAL_COMMUNITY): Payer: Medicare Other

## 2014-02-20 DIAGNOSIS — Z5189 Encounter for other specified aftercare: Secondary | ICD-10-CM | POA: Insufficient documentation

## 2014-02-20 DIAGNOSIS — I10 Essential (primary) hypertension: Secondary | ICD-10-CM | POA: Insufficient documentation

## 2014-02-20 DIAGNOSIS — I4891 Unspecified atrial fibrillation: Secondary | ICD-10-CM | POA: Insufficient documentation

## 2014-02-20 DIAGNOSIS — I2510 Atherosclerotic heart disease of native coronary artery without angina pectoris: Secondary | ICD-10-CM | POA: Insufficient documentation

## 2014-02-20 DIAGNOSIS — Z952 Presence of prosthetic heart valve: Secondary | ICD-10-CM | POA: Insufficient documentation

## 2014-02-21 ENCOUNTER — Encounter (HOSPITAL_COMMUNITY): Payer: Self-pay

## 2014-02-21 ENCOUNTER — Encounter (HOSPITAL_COMMUNITY): Payer: Medicare Other

## 2014-02-22 ENCOUNTER — Encounter (HOSPITAL_COMMUNITY): Payer: Self-pay

## 2014-02-27 ENCOUNTER — Encounter (HOSPITAL_COMMUNITY): Payer: Self-pay

## 2014-02-28 ENCOUNTER — Ambulatory Visit (HOSPITAL_COMMUNITY): Payer: Medicare Other | Attending: Internal Medicine | Admitting: Cardiology

## 2014-02-28 ENCOUNTER — Encounter (HOSPITAL_COMMUNITY): Payer: Self-pay

## 2014-02-28 DIAGNOSIS — I6523 Occlusion and stenosis of bilateral carotid arteries: Secondary | ICD-10-CM

## 2014-02-28 DIAGNOSIS — E785 Hyperlipidemia, unspecified: Secondary | ICD-10-CM | POA: Insufficient documentation

## 2014-02-28 DIAGNOSIS — Z87891 Personal history of nicotine dependence: Secondary | ICD-10-CM | POA: Insufficient documentation

## 2014-02-28 DIAGNOSIS — I10 Essential (primary) hypertension: Secondary | ICD-10-CM | POA: Diagnosis not present

## 2014-02-28 DIAGNOSIS — I2510 Atherosclerotic heart disease of native coronary artery without angina pectoris: Secondary | ICD-10-CM | POA: Diagnosis not present

## 2014-02-28 DIAGNOSIS — I6529 Occlusion and stenosis of unspecified carotid artery: Secondary | ICD-10-CM

## 2014-02-28 NOTE — Progress Notes (Signed)
Carotid duplex performed 

## 2014-03-01 ENCOUNTER — Encounter (HOSPITAL_COMMUNITY): Payer: Self-pay

## 2014-03-06 ENCOUNTER — Encounter (HOSPITAL_COMMUNITY)
Admission: RE | Admit: 2014-03-06 | Discharge: 2014-03-06 | Disposition: A | Discharge status: Home / Self Care | Payer: Self-pay | Source: Ambulatory Visit | Attending: Interventional Cardiology | Admitting: Interventional Cardiology

## 2014-03-07 ENCOUNTER — Encounter (HOSPITAL_COMMUNITY)
Admission: RE | Admit: 2014-03-07 | Discharge: 2014-03-07 | Disposition: A | Discharge status: Home / Self Care | Payer: Self-pay | Source: Ambulatory Visit | Attending: Interventional Cardiology | Admitting: Interventional Cardiology

## 2014-03-08 ENCOUNTER — Encounter (HOSPITAL_COMMUNITY): Payer: Self-pay

## 2014-03-13 ENCOUNTER — Encounter (HOSPITAL_COMMUNITY): Payer: Self-pay

## 2014-03-14 ENCOUNTER — Encounter (HOSPITAL_COMMUNITY): Payer: Self-pay

## 2014-03-20 ENCOUNTER — Encounter (HOSPITAL_COMMUNITY)
Admission: RE | Admit: 2014-03-20 | Discharge: 2014-03-20 | Disposition: A | Discharge status: Home / Self Care | Payer: Self-pay | Source: Ambulatory Visit | Attending: Interventional Cardiology | Admitting: Interventional Cardiology

## 2014-03-20 DIAGNOSIS — Z5189 Encounter for other specified aftercare: Secondary | ICD-10-CM | POA: Insufficient documentation

## 2014-03-20 DIAGNOSIS — I10 Essential (primary) hypertension: Secondary | ICD-10-CM | POA: Insufficient documentation

## 2014-03-20 DIAGNOSIS — I4891 Unspecified atrial fibrillation: Secondary | ICD-10-CM | POA: Insufficient documentation

## 2014-03-20 DIAGNOSIS — Z952 Presence of prosthetic heart valve: Secondary | ICD-10-CM | POA: Insufficient documentation

## 2014-03-20 DIAGNOSIS — I2510 Atherosclerotic heart disease of native coronary artery without angina pectoris: Secondary | ICD-10-CM | POA: Insufficient documentation

## 2014-03-21 ENCOUNTER — Encounter (HOSPITAL_COMMUNITY): Payer: Self-pay

## 2014-03-22 ENCOUNTER — Encounter (HOSPITAL_COMMUNITY)
Admission: RE | Admit: 2014-03-22 | Discharge: 2014-03-22 | Disposition: A | Discharge status: Home / Self Care | Payer: Self-pay | Source: Ambulatory Visit | Attending: Interventional Cardiology | Admitting: Interventional Cardiology

## 2014-03-27 ENCOUNTER — Encounter (HOSPITAL_COMMUNITY)
Admission: RE | Admit: 2014-03-27 | Discharge: 2014-03-27 | Disposition: A | Discharge status: Home / Self Care | Payer: Self-pay | Source: Ambulatory Visit | Attending: Interventional Cardiology | Admitting: Interventional Cardiology

## 2014-03-28 ENCOUNTER — Encounter (HOSPITAL_COMMUNITY)
Admission: RE | Admit: 2014-03-28 | Discharge: 2014-03-28 | Disposition: A | Discharge status: Home / Self Care | Payer: Self-pay | Source: Ambulatory Visit | Attending: Interventional Cardiology | Admitting: Interventional Cardiology

## 2014-03-29 ENCOUNTER — Encounter (HOSPITAL_COMMUNITY)
Admission: RE | Admit: 2014-03-29 | Discharge: 2014-03-29 | Disposition: A | Discharge status: Home / Self Care | Payer: Self-pay | Source: Ambulatory Visit | Attending: Interventional Cardiology | Admitting: Interventional Cardiology

## 2014-04-03 ENCOUNTER — Encounter (HOSPITAL_COMMUNITY): Payer: Self-pay

## 2014-04-04 ENCOUNTER — Encounter (HOSPITAL_COMMUNITY): Payer: Self-pay

## 2014-04-05 ENCOUNTER — Encounter (HOSPITAL_COMMUNITY): Payer: Self-pay

## 2014-04-10 ENCOUNTER — Encounter (HOSPITAL_COMMUNITY): Payer: Self-pay

## 2014-04-11 ENCOUNTER — Encounter (HOSPITAL_COMMUNITY)
Admission: RE | Admit: 2014-04-11 | Discharge: 2014-04-11 | Disposition: A | Discharge status: Home / Self Care | Payer: Self-pay | Source: Ambulatory Visit | Attending: Interventional Cardiology | Admitting: Interventional Cardiology

## 2014-04-12 ENCOUNTER — Encounter (HOSPITAL_COMMUNITY)
Admission: RE | Admit: 2014-04-12 | Discharge: 2014-04-12 | Disposition: A | Discharge status: Home / Self Care | Payer: Self-pay | Source: Ambulatory Visit | Attending: Interventional Cardiology | Admitting: Interventional Cardiology

## 2014-04-17 ENCOUNTER — Encounter (HOSPITAL_COMMUNITY)
Admission: RE | Admit: 2014-04-17 | Discharge: 2014-04-17 | Disposition: A | Discharge status: Home / Self Care | Payer: Self-pay | Source: Ambulatory Visit | Attending: Interventional Cardiology | Admitting: Interventional Cardiology

## 2014-04-18 ENCOUNTER — Encounter (HOSPITAL_COMMUNITY): Payer: Self-pay

## 2014-04-19 ENCOUNTER — Encounter (HOSPITAL_COMMUNITY): Payer: Self-pay

## 2014-04-24 ENCOUNTER — Encounter (HOSPITAL_COMMUNITY)
Admission: RE | Admit: 2014-04-24 | Discharge: 2014-04-24 | Disposition: A | Discharge status: Home / Self Care | Payer: Self-pay | Source: Ambulatory Visit | Attending: Interventional Cardiology | Admitting: Interventional Cardiology

## 2014-04-24 DIAGNOSIS — I4891 Unspecified atrial fibrillation: Secondary | ICD-10-CM | POA: Insufficient documentation

## 2014-04-24 DIAGNOSIS — Z952 Presence of prosthetic heart valve: Secondary | ICD-10-CM | POA: Insufficient documentation

## 2014-04-24 DIAGNOSIS — I10 Essential (primary) hypertension: Secondary | ICD-10-CM | POA: Insufficient documentation

## 2014-04-24 DIAGNOSIS — I2510 Atherosclerotic heart disease of native coronary artery without angina pectoris: Secondary | ICD-10-CM | POA: Insufficient documentation

## 2014-04-24 DIAGNOSIS — Z5189 Encounter for other specified aftercare: Secondary | ICD-10-CM | POA: Insufficient documentation

## 2014-04-25 ENCOUNTER — Encounter (HOSPITAL_COMMUNITY)
Admission: RE | Admit: 2014-04-25 | Discharge: 2014-04-25 | Disposition: A | Discharge status: Home / Self Care | Payer: Self-pay | Source: Ambulatory Visit | Attending: Interventional Cardiology | Admitting: Interventional Cardiology

## 2014-04-26 ENCOUNTER — Encounter (HOSPITAL_COMMUNITY)
Admission: RE | Admit: 2014-04-26 | Discharge: 2014-04-26 | Disposition: A | Discharge status: Home / Self Care | Payer: Self-pay | Source: Ambulatory Visit | Attending: Interventional Cardiology | Admitting: Interventional Cardiology

## 2014-05-01 ENCOUNTER — Encounter (HOSPITAL_COMMUNITY)
Admission: RE | Admit: 2014-05-01 | Discharge: 2014-05-01 | Disposition: A | Discharge status: Home / Self Care | Payer: Self-pay | Source: Ambulatory Visit | Attending: Interventional Cardiology | Admitting: Interventional Cardiology

## 2014-05-02 ENCOUNTER — Encounter (HOSPITAL_COMMUNITY): Payer: Medicare Other

## 2014-05-03 ENCOUNTER — Encounter (HOSPITAL_COMMUNITY): Payer: Self-pay

## 2014-05-08 ENCOUNTER — Encounter (HOSPITAL_COMMUNITY): Payer: Self-pay

## 2014-05-09 ENCOUNTER — Encounter (HOSPITAL_COMMUNITY): Payer: Self-pay

## 2014-05-10 ENCOUNTER — Other Ambulatory Visit: Payer: Self-pay | Admitting: Interventional Cardiology

## 2014-05-10 ENCOUNTER — Encounter (HOSPITAL_COMMUNITY): Payer: Self-pay

## 2014-05-14 ENCOUNTER — Other Ambulatory Visit: Payer: Self-pay | Admitting: Interventional Cardiology

## 2014-05-15 ENCOUNTER — Encounter (HOSPITAL_COMMUNITY): Payer: Self-pay

## 2014-05-16 ENCOUNTER — Encounter (HOSPITAL_COMMUNITY): Payer: Self-pay

## 2014-05-17 ENCOUNTER — Encounter (HOSPITAL_COMMUNITY): Payer: Self-pay

## 2014-05-22 ENCOUNTER — Encounter (HOSPITAL_COMMUNITY): Payer: Medicare Other

## 2014-05-22 DIAGNOSIS — I10 Essential (primary) hypertension: Secondary | ICD-10-CM | POA: Insufficient documentation

## 2014-05-22 DIAGNOSIS — I4891 Unspecified atrial fibrillation: Secondary | ICD-10-CM | POA: Insufficient documentation

## 2014-05-22 DIAGNOSIS — Z952 Presence of prosthetic heart valve: Secondary | ICD-10-CM | POA: Insufficient documentation

## 2014-05-22 DIAGNOSIS — Z5189 Encounter for other specified aftercare: Secondary | ICD-10-CM | POA: Insufficient documentation

## 2014-05-22 DIAGNOSIS — I2510 Atherosclerotic heart disease of native coronary artery without angina pectoris: Secondary | ICD-10-CM | POA: Insufficient documentation

## 2014-05-23 ENCOUNTER — Encounter (HOSPITAL_COMMUNITY)
Admission: RE | Admit: 2014-05-23 | Discharge: 2014-05-23 | Disposition: A | Discharge status: Home / Self Care | Payer: Self-pay | Source: Ambulatory Visit | Attending: Interventional Cardiology | Admitting: Interventional Cardiology

## 2014-05-24 ENCOUNTER — Encounter (HOSPITAL_COMMUNITY)
Admission: RE | Admit: 2014-05-24 | Discharge: 2014-05-24 | Disposition: A | Discharge status: Home / Self Care | Payer: Self-pay | Source: Ambulatory Visit | Attending: Interventional Cardiology | Admitting: Interventional Cardiology

## 2014-05-29 ENCOUNTER — Encounter (HOSPITAL_COMMUNITY)
Admission: RE | Admit: 2014-05-29 | Discharge: 2014-05-29 | Disposition: A | Discharge status: Home / Self Care | Payer: Self-pay | Source: Ambulatory Visit | Attending: Interventional Cardiology | Admitting: Interventional Cardiology

## 2014-05-30 ENCOUNTER — Encounter (HOSPITAL_COMMUNITY)
Admission: RE | Admit: 2014-05-30 | Discharge: 2014-05-30 | Disposition: A | Discharge status: Home / Self Care | Payer: Self-pay | Source: Ambulatory Visit | Attending: Interventional Cardiology | Admitting: Interventional Cardiology

## 2014-05-31 ENCOUNTER — Encounter (HOSPITAL_COMMUNITY): Payer: Self-pay

## 2014-06-05 ENCOUNTER — Encounter (HOSPITAL_COMMUNITY): Payer: Self-pay

## 2014-06-06 ENCOUNTER — Encounter (HOSPITAL_COMMUNITY)
Admission: RE | Admit: 2014-06-06 | Discharge: 2014-06-06 | Disposition: A | Discharge status: Home / Self Care | Payer: Self-pay | Source: Ambulatory Visit | Attending: Interventional Cardiology | Admitting: Interventional Cardiology

## 2014-06-07 ENCOUNTER — Encounter (HOSPITAL_COMMUNITY): Payer: Self-pay

## 2014-06-12 ENCOUNTER — Encounter (HOSPITAL_COMMUNITY)
Admission: RE | Admit: 2014-06-12 | Discharge: 2014-06-12 | Disposition: A | Discharge status: Home / Self Care | Payer: Self-pay | Source: Ambulatory Visit | Attending: Interventional Cardiology | Admitting: Interventional Cardiology

## 2014-06-13 ENCOUNTER — Encounter (HOSPITAL_COMMUNITY): Payer: Self-pay

## 2014-06-14 ENCOUNTER — Encounter (HOSPITAL_COMMUNITY)
Admission: RE | Admit: 2014-06-14 | Discharge: 2014-06-14 | Disposition: A | Discharge status: Home / Self Care | Payer: Self-pay | Source: Ambulatory Visit | Attending: Interventional Cardiology | Admitting: Interventional Cardiology

## 2014-06-19 ENCOUNTER — Encounter (HOSPITAL_COMMUNITY)
Admission: RE | Admit: 2014-06-19 | Discharge: 2014-06-19 | Disposition: A | Discharge status: Home / Self Care | Payer: Self-pay | Source: Ambulatory Visit | Attending: Interventional Cardiology | Admitting: Interventional Cardiology

## 2014-06-19 ENCOUNTER — Ambulatory Visit: Payer: Self-pay | Admitting: Interventional Cardiology

## 2014-06-19 DIAGNOSIS — I4891 Unspecified atrial fibrillation: Secondary | ICD-10-CM | POA: Insufficient documentation

## 2014-06-19 DIAGNOSIS — Z952 Presence of prosthetic heart valve: Secondary | ICD-10-CM | POA: Insufficient documentation

## 2014-06-19 DIAGNOSIS — I10 Essential (primary) hypertension: Secondary | ICD-10-CM | POA: Insufficient documentation

## 2014-06-19 DIAGNOSIS — Z5189 Encounter for other specified aftercare: Secondary | ICD-10-CM | POA: Insufficient documentation

## 2014-06-19 DIAGNOSIS — I2510 Atherosclerotic heart disease of native coronary artery without angina pectoris: Secondary | ICD-10-CM | POA: Insufficient documentation

## 2014-06-20 ENCOUNTER — Encounter (HOSPITAL_COMMUNITY)
Admission: RE | Admit: 2014-06-20 | Discharge: 2014-06-20 | Disposition: A | Discharge status: Home / Self Care | Payer: Self-pay | Source: Ambulatory Visit | Attending: Interventional Cardiology | Admitting: Interventional Cardiology

## 2014-06-21 ENCOUNTER — Encounter (HOSPITAL_COMMUNITY)
Admission: RE | Admit: 2014-06-21 | Discharge: 2014-06-21 | Disposition: A | Discharge status: Home / Self Care | Payer: Self-pay | Source: Ambulatory Visit | Attending: Interventional Cardiology | Admitting: Interventional Cardiology

## 2014-06-26 ENCOUNTER — Encounter (HOSPITAL_COMMUNITY): Payer: Self-pay

## 2014-06-27 ENCOUNTER — Encounter (HOSPITAL_COMMUNITY): Payer: Self-pay

## 2014-06-28 ENCOUNTER — Encounter (HOSPITAL_COMMUNITY): Payer: Self-pay

## 2014-07-03 ENCOUNTER — Encounter (HOSPITAL_COMMUNITY)
Admission: RE | Admit: 2014-07-03 | Discharge: 2014-07-03 | Disposition: A | Discharge status: Home / Self Care | Payer: Self-pay | Source: Ambulatory Visit | Attending: Interventional Cardiology | Admitting: Interventional Cardiology

## 2014-07-04 ENCOUNTER — Encounter (HOSPITAL_COMMUNITY)
Admission: RE | Admit: 2014-07-04 | Discharge: 2014-07-04 | Disposition: A | Discharge status: Home / Self Care | Payer: Self-pay | Source: Ambulatory Visit | Attending: Interventional Cardiology | Admitting: Interventional Cardiology

## 2014-07-05 ENCOUNTER — Encounter (HOSPITAL_COMMUNITY)
Admission: RE | Admit: 2014-07-05 | Discharge: 2014-07-05 | Disposition: A | Discharge status: Home / Self Care | Payer: Self-pay | Source: Ambulatory Visit | Attending: Interventional Cardiology | Admitting: Interventional Cardiology

## 2014-07-10 ENCOUNTER — Encounter (HOSPITAL_COMMUNITY)
Admission: RE | Admit: 2014-07-10 | Discharge: 2014-07-10 | Disposition: A | Discharge status: Home / Self Care | Payer: Self-pay | Source: Ambulatory Visit | Attending: Interventional Cardiology | Admitting: Interventional Cardiology

## 2014-07-11 ENCOUNTER — Encounter (HOSPITAL_COMMUNITY)
Admission: RE | Admit: 2014-07-11 | Discharge: 2014-07-11 | Disposition: A | Discharge status: Home / Self Care | Payer: Self-pay | Source: Ambulatory Visit | Attending: Interventional Cardiology | Admitting: Interventional Cardiology

## 2014-07-12 ENCOUNTER — Encounter (HOSPITAL_COMMUNITY)
Admission: RE | Admit: 2014-07-12 | Discharge: 2014-07-12 | Disposition: A | Discharge status: Home / Self Care | Payer: Self-pay | Source: Ambulatory Visit | Attending: Interventional Cardiology | Admitting: Interventional Cardiology

## 2014-07-17 ENCOUNTER — Encounter (HOSPITAL_COMMUNITY)
Admission: RE | Admit: 2014-07-17 | Discharge: 2014-07-17 | Disposition: A | Discharge status: Home / Self Care | Payer: Self-pay | Source: Ambulatory Visit | Attending: Interventional Cardiology | Admitting: Interventional Cardiology

## 2014-07-18 ENCOUNTER — Encounter (HOSPITAL_COMMUNITY)
Admission: RE | Admit: 2014-07-18 | Discharge: 2014-07-18 | Disposition: A | Discharge status: Home / Self Care | Payer: Self-pay | Source: Ambulatory Visit | Attending: Interventional Cardiology | Admitting: Interventional Cardiology

## 2014-07-19 ENCOUNTER — Encounter (HOSPITAL_COMMUNITY)
Admission: RE | Admit: 2014-07-19 | Discharge: 2014-07-19 | Disposition: A | Discharge status: Home / Self Care | Payer: Self-pay | Source: Ambulatory Visit | Attending: Interventional Cardiology | Admitting: Interventional Cardiology

## 2014-07-24 ENCOUNTER — Encounter (HOSPITAL_COMMUNITY)
Admission: RE | Admit: 2014-07-24 | Discharge: 2014-07-24 | Disposition: A | Discharge status: Home / Self Care | Payer: Self-pay | Source: Ambulatory Visit | Attending: Interventional Cardiology | Admitting: Interventional Cardiology

## 2014-07-24 DIAGNOSIS — I4891 Unspecified atrial fibrillation: Secondary | ICD-10-CM | POA: Insufficient documentation

## 2014-07-24 DIAGNOSIS — I10 Essential (primary) hypertension: Secondary | ICD-10-CM | POA: Insufficient documentation

## 2014-07-24 DIAGNOSIS — Z5189 Encounter for other specified aftercare: Secondary | ICD-10-CM | POA: Insufficient documentation

## 2014-07-24 DIAGNOSIS — I2510 Atherosclerotic heart disease of native coronary artery without angina pectoris: Secondary | ICD-10-CM | POA: Insufficient documentation

## 2014-07-24 DIAGNOSIS — Z952 Presence of prosthetic heart valve: Secondary | ICD-10-CM | POA: Insufficient documentation

## 2014-07-25 ENCOUNTER — Encounter (HOSPITAL_COMMUNITY): Payer: Self-pay

## 2014-07-26 ENCOUNTER — Encounter (HOSPITAL_COMMUNITY): Payer: Self-pay

## 2014-07-31 ENCOUNTER — Encounter (HOSPITAL_COMMUNITY)
Admission: RE | Admit: 2014-07-31 | Discharge: 2014-07-31 | Disposition: A | Discharge status: Home / Self Care | Payer: Self-pay | Source: Ambulatory Visit | Attending: Interventional Cardiology | Admitting: Interventional Cardiology

## 2014-08-01 ENCOUNTER — Encounter (HOSPITAL_COMMUNITY)
Admission: RE | Admit: 2014-08-01 | Discharge: 2014-08-01 | Disposition: A | Discharge status: Home / Self Care | Payer: Self-pay | Source: Ambulatory Visit | Attending: Interventional Cardiology | Admitting: Interventional Cardiology

## 2014-08-02 ENCOUNTER — Encounter (HOSPITAL_COMMUNITY): Payer: Self-pay

## 2014-08-07 ENCOUNTER — Encounter (HOSPITAL_COMMUNITY)
Admission: RE | Admit: 2014-08-07 | Discharge: 2014-08-07 | Disposition: A | Discharge status: Home / Self Care | Payer: Self-pay | Source: Ambulatory Visit | Attending: Interventional Cardiology | Admitting: Interventional Cardiology

## 2014-08-08 ENCOUNTER — Encounter (HOSPITAL_COMMUNITY): Payer: Self-pay

## 2014-08-09 ENCOUNTER — Encounter (HOSPITAL_COMMUNITY)
Admission: RE | Admit: 2014-08-09 | Discharge: 2014-08-09 | Disposition: A | Discharge status: Home / Self Care | Payer: Self-pay | Source: Ambulatory Visit | Attending: Interventional Cardiology | Admitting: Interventional Cardiology

## 2014-08-14 ENCOUNTER — Encounter (HOSPITAL_COMMUNITY): Payer: Self-pay

## 2014-08-15 ENCOUNTER — Encounter (HOSPITAL_COMMUNITY): Payer: Self-pay

## 2014-08-16 ENCOUNTER — Encounter (HOSPITAL_COMMUNITY): Admission: RE | Admit: 2014-08-16 | Payer: Self-pay | Source: Ambulatory Visit

## 2014-08-21 ENCOUNTER — Other Ambulatory Visit: Payer: Self-pay | Admitting: Interventional Cardiology

## 2014-08-21 ENCOUNTER — Encounter (HOSPITAL_COMMUNITY): Payer: PRIVATE HEALTH INSURANCE

## 2014-08-21 DIAGNOSIS — I2510 Atherosclerotic heart disease of native coronary artery without angina pectoris: Secondary | ICD-10-CM | POA: Insufficient documentation

## 2014-08-21 DIAGNOSIS — Z952 Presence of prosthetic heart valve: Secondary | ICD-10-CM | POA: Insufficient documentation

## 2014-08-21 DIAGNOSIS — Z5189 Encounter for other specified aftercare: Secondary | ICD-10-CM | POA: Insufficient documentation

## 2014-08-21 DIAGNOSIS — I4891 Unspecified atrial fibrillation: Secondary | ICD-10-CM | POA: Insufficient documentation

## 2014-08-21 DIAGNOSIS — I10 Essential (primary) hypertension: Secondary | ICD-10-CM | POA: Insufficient documentation

## 2014-08-22 ENCOUNTER — Encounter (HOSPITAL_COMMUNITY): Payer: Self-pay

## 2014-08-23 ENCOUNTER — Encounter (HOSPITAL_COMMUNITY)
Admission: RE | Admit: 2014-08-23 | Discharge: 2014-08-23 | Disposition: A | Discharge status: Home / Self Care | Payer: Self-pay | Source: Ambulatory Visit | Attending: Interventional Cardiology | Admitting: Interventional Cardiology

## 2014-08-28 ENCOUNTER — Encounter (HOSPITAL_COMMUNITY)
Admission: RE | Admit: 2014-08-28 | Discharge: 2014-08-28 | Disposition: A | Discharge status: Home / Self Care | Payer: Self-pay | Source: Ambulatory Visit | Attending: Interventional Cardiology | Admitting: Interventional Cardiology

## 2014-08-29 ENCOUNTER — Encounter (HOSPITAL_COMMUNITY)
Admission: RE | Admit: 2014-08-29 | Discharge: 2014-08-29 | Disposition: A | Discharge status: Home / Self Care | Payer: Self-pay | Source: Ambulatory Visit | Attending: Interventional Cardiology | Admitting: Interventional Cardiology

## 2014-08-30 ENCOUNTER — Encounter (HOSPITAL_COMMUNITY)
Admission: RE | Admit: 2014-08-30 | Discharge: 2014-08-30 | Disposition: A | Discharge status: Home / Self Care | Payer: Self-pay | Source: Ambulatory Visit | Attending: Interventional Cardiology | Admitting: Interventional Cardiology

## 2014-09-04 ENCOUNTER — Encounter (HOSPITAL_COMMUNITY)
Admission: RE | Admit: 2014-09-04 | Discharge: 2014-09-04 | Disposition: A | Discharge status: Home / Self Care | Payer: Self-pay | Source: Ambulatory Visit | Attending: Interventional Cardiology | Admitting: Interventional Cardiology

## 2014-09-05 ENCOUNTER — Encounter (HOSPITAL_COMMUNITY)
Admission: RE | Admit: 2014-09-05 | Discharge: 2014-09-05 | Disposition: A | Discharge status: Home / Self Care | Payer: Self-pay | Source: Ambulatory Visit | Attending: Interventional Cardiology | Admitting: Interventional Cardiology

## 2014-09-06 ENCOUNTER — Encounter (HOSPITAL_COMMUNITY)
Admission: RE | Admit: 2014-09-06 | Discharge: 2014-09-06 | Disposition: A | Discharge status: Home / Self Care | Payer: Self-pay | Source: Ambulatory Visit | Attending: Interventional Cardiology | Admitting: Interventional Cardiology

## 2014-09-10 ENCOUNTER — Other Ambulatory Visit: Payer: Self-pay | Admitting: *Deleted

## 2014-09-10 ENCOUNTER — Encounter: Payer: Self-pay | Admitting: Interventional Cardiology

## 2014-09-10 ENCOUNTER — Telehealth: Payer: Self-pay

## 2014-09-10 ENCOUNTER — Telehealth: Payer: Self-pay | Admitting: Interventional Cardiology

## 2014-09-10 ENCOUNTER — Ambulatory Visit (INDEPENDENT_AMBULATORY_CARE_PROVIDER_SITE_OTHER): Payer: Medicare Other | Admitting: Interventional Cardiology

## 2014-09-10 VITALS — BP 168/60 | HR 43 | Ht 69.0 in | Wt 172.2 lb

## 2014-09-10 DIAGNOSIS — I10 Essential (primary) hypertension: Secondary | ICD-10-CM | POA: Diagnosis not present

## 2014-09-10 DIAGNOSIS — D500 Iron deficiency anemia secondary to blood loss (chronic): Secondary | ICD-10-CM

## 2014-09-10 DIAGNOSIS — I359 Nonrheumatic aortic valve disorder, unspecified: Secondary | ICD-10-CM

## 2014-09-10 DIAGNOSIS — I481 Persistent atrial fibrillation: Secondary | ICD-10-CM

## 2014-09-10 DIAGNOSIS — I4819 Other persistent atrial fibrillation: Secondary | ICD-10-CM

## 2014-09-10 DIAGNOSIS — R7989 Other specified abnormal findings of blood chemistry: Secondary | ICD-10-CM

## 2014-09-10 DIAGNOSIS — R001 Bradycardia, unspecified: Secondary | ICD-10-CM

## 2014-09-10 LAB — CBC WITH DIFFERENTIAL/PLATELET
BASOS PCT: 0.6 % (ref 0.0–3.0)
Basophils Absolute: 0 10*3/uL (ref 0.0–0.1)
Eosinophils Absolute: 0.3 10*3/uL (ref 0.0–0.7)
Eosinophils Relative: 3.8 % (ref 0.0–5.0)
HEMATOCRIT: 42.6 % (ref 39.0–52.0)
HEMOGLOBIN: 14.4 g/dL (ref 13.0–17.0)
Lymphocytes Relative: 20.2 % (ref 12.0–46.0)
Lymphs Abs: 1.6 10*3/uL (ref 0.7–4.0)
MCHC: 33.9 g/dL (ref 30.0–36.0)
MCV: 97.2 fl (ref 78.0–100.0)
MONO ABS: 1.1 10*3/uL — AB (ref 0.1–1.0)
MONOS PCT: 14.1 % — AB (ref 3.0–12.0)
NEUTROS ABS: 4.9 10*3/uL (ref 1.4–7.7)
NEUTROS PCT: 61.3 % (ref 43.0–77.0)
Platelets: 141 10*3/uL — ABNORMAL LOW (ref 150.0–400.0)
RBC: 4.38 Mil/uL (ref 4.22–5.81)
RDW: 17 % — AB (ref 11.5–15.5)
WBC: 8 10*3/uL (ref 4.0–10.5)

## 2014-09-10 LAB — BASIC METABOLIC PANEL
BUN: 68 mg/dL — ABNORMAL HIGH (ref 6–23)
CALCIUM: 9.5 mg/dL (ref 8.4–10.5)
CHLORIDE: 107 meq/L (ref 96–112)
CO2: 22 mEq/L (ref 19–32)
Creatinine, Ser: 2.67 mg/dL — ABNORMAL HIGH (ref 0.40–1.50)
GFR: 24.48 mL/min — AB (ref >60.00)
GLUCOSE: 114 mg/dL — AB (ref 70–99)
POTASSIUM: 4.2 meq/L (ref 3.5–5.1)
Sodium: 138 mEq/L (ref 135–145)

## 2014-09-10 MED ORDER — APIXABAN 2.5 MG PO TABS: 2.5000 mg | ORAL_TABLET | Freq: Two times a day (BID) | ORAL | Status: DC

## 2014-09-10 NOTE — Patient Instructions (Signed)
**Note De-Identified Candler Ginsberg Obfuscation** Medication Instructions:  Stop taking Metoprolol Start taking Eliquis 2.5 mg twice daily  Labwork: Today (BMET and CBCD)  Testing/Procedures: None  Follow-Up: Your physician recommends that you schedule a follow-up appointment in: 3 months

## 2014-09-10 NOTE — Telephone Encounter (Signed)
Prior auth sent to [employer] for Eliquis 2.5mg  via Cover My Meds.

## 2014-09-10 NOTE — Progress Notes (Signed)
Patient ID: [name], male   DOB: [birth year], 81 y.o.   MRN: [id number]     Cardiology Office Note   Date:  09/10/2014   ID:  [name], DOB 11/24/1930, MRN [id number]  PCP:  Mathews Argyle, MD    No chief complaint on file. AV stenosis, CAD   Wt Readings from Last 3 Encounters:  09/10/14 172 lb 3.2 oz (78.109 kg)  02/14/14 167 lb 1.9 oz (75.805 kg)  08/02/13 171 lb 1.9 oz (77.62 kg)       History of Present Illness: [name] is a 81 y.o. male  who had TAVR done in 2013, with a h/o CAD with CABG in 2001 and PCI in 2013. He had severe anemia in 3/15 requiring transfusion. GI workup was negative. He is not doing cardiac rehab. He is using an inhaler. Prior MRI showed spinal stenosis. He has had gout as well in his feet. He is now treated for OSA. No longer Using Metolazone when he feels fluid overload. He weighs himself and looks for fluid overload corresponding to an increase in weight by 3 lbs.  CAD/ASCVD:  Regularly Exercising at cardiac rehab. No chest pain. Denies : Chest pain.   Reports Dyspnea on exertion.  Related to emphysema Improving Leg edema.  No problems with using exercise bike.   He notes some unsteadiness. He has been off of Coumadin since the anemia. Willing to restart anticoagulation    Past Medical History  Diagnosis Date  . Aortic valve disorder     s/p TAVR at [facility]   . PVD (peripheral vascular disease)   . Hypercholesteremia   . HTN (hypertension)   . Atrial fibrillation     Chronic  . Atrial flutter   . CKD (chronic kidney disease)   . AAA (abdominal aortic aneurysm)   . CAD (coronary artery disease)     Status post CABG, stent to diagonal prior to TAVR  . Bladder cancer   . Gallstone   . Vitamin B12 deficiency   . Colon cancer   . Carotid stenosis   . Gout     Past Surgical History  Procedure Laterality Date  . Coronary artery bypass graft    . Laminectomy    . Appendectomy    . Colectomy    . Toe surgery     . Hemicolectomy    . Mohs surgery    . Angioplasty    . Esophagogastroduodenoscopy N/A 06/28/2013    Procedure: ESOPHAGOGASTRODUODENOSCOPY (EGD);  Surgeon: Winfield Cunas., MD;  Location: [facility] ENDOSCOPY;  Service: Endoscopy;  Laterality: N/A;  . Colonoscopy N/A 06/30/2013    Procedure: COLONOSCOPY;  Surgeon: Winfield Cunas., MD;  Location: [facility] ENDOSCOPY;  Service: Endoscopy;  Laterality: N/A;  . Colonoscopy with esophagogastroduodenoscopy (egd) N/A 07/03/2013    Procedure: COLONOSCOPY ;  Surgeon: Missy Sabins, MD;  Location: [facility];  Service: Endoscopy;  Laterality: N/A;  Patient is already sedated on the ventilator and may need minimal if any additional sedation.  Please do at bedside.  Will place NG and give some Nulytely as prep.  . Esophagogastroduodenoscopy N/A 07/03/2013    Procedure: ESOPHAGOGASTRODUODENOSCOPY (EGD);  Surgeon: Missy Sabins, MD;  Location: [facility] ENDOSCOPY;  Service: Endoscopy;  Laterality: N/A;  . Givens capsule study N/A 07/03/2013    Procedure: GIVENS CAPSULE STUDY;  Surgeon: Missy Sabins, MD;  Location: [facility];  Service: Endoscopy;  Laterality: N/A;     Current Outpatient Prescriptions  Medication Sig Dispense Refill  . albuterol (PROVENTIL HFA;VENTOLIN HFA) 108 (90 BASE) MCG/ACT inhaler Inhale 2 puffs into the lungs every 6 (six) hours as needed for wheezing or shortness of breath.    . allopurinol (ZYLOPRIM) 100 MG tablet Take 100 mg by mouth 2 (two) times daily.     Marland Kitchen atorvastatin (LIPITOR) 40 MG tablet Take 40 mg by mouth daily at 6 PM.     . citric acid-sodium citrate (ORACIT) 334-500 MG/5ML solution Take 15 mLs by mouth every morning. 1 Tablespoon Daily    . Cyanocobalamin (VITAMIN B-12 IJ) Inject 1 each as directed every 30 (thirty) days.    . famotidine (TH FAMOTIDINE 10) 10 MG tablet Take 10 mg by mouth every morning.    . febuxostat (ULORIC) 40 MG tablet Take 40 mg by mouth every morning.    . ferrous sulfate 325 (65 FE) MG EC tablet Take 1  tablet (325 mg total) by mouth daily with breakfast. 30 tablet   . furosemide (LASIX) 40 MG tablet Take 1 tablet by mouth  daily 60 tablet 3  . metoprolol tartrate (LOPRESSOR) 25 MG tablet Take 1 tablet (25 mg total) by mouth 2 (two) times daily. 30 tablet 0  . predniSONE (DELTASONE) 10 MG tablet Take 10 mg by mouth daily as needed (For gout).    . Tiotropium Bromide Monohydrate (SPIRIVA HANDIHALER IN) Inhale 2 puffs into the lungs daily.     No current facility-administered medications for this visit.    Allergies:   Ramipril    Social History:  The patient  reports that he has quit smoking. He has never used smokeless tobacco. He reports that he drinks alcohol. He reports that he does not use illicit drugs.   Family History:  The patient's *family history includes Cancer in his brother and mother; Diabetes in his father; Heart disease in his father.    ROS:  Please see the history of present illness.   Otherwise, review of systems are positive for poor balance, shortness of breath.   All other systems are reviewed and negative.    PHYSICAL EXAM: VS:  BP 168/60 mmHg  Pulse 43  Ht 5\' 9"  (1.753 m)  Wt 172 lb 3.2 oz (78.109 kg)  BMI 25.42 kg/m2 , BMI Body mass index is 25.42 kg/(m^2). GEN: Well nourished, well developed, in no acute distress HEENT: normal Neck: no JVD, carotid bruits, or masses Cardiac: irregularly irregular, bradycardic; no murmurs, rubs, or gallops,no edema  Respiratory:  clear to auscultation bilaterally, normal work of breathing GI: soft, nontender, nondistended, + BS MS: no deformity or atrophy Skin: warm and dry, no rash Neuro:  Strength and sensation are intact Psych: euthymic mood, full affect   EKG:   The ekg ordered today demonstrates atrial fibrillation, slow ventricular response   Recent Labs: No results found for requested labs within last 365 days.   Lipid Panel No results found for: CHOL, TRIG, HDL, CHOLHDL, VLDL, LDLCALC, LDLDIRECT     Other studies Reviewed: Additional studies/ records that were reviewed today with results demonstrating: .   ASSESSMENT AND PLAN:  1.  Aortic valve disorders  Notes: status post TAVR at [facility] several years. He will followup there regularly for one more year; then study f/u is finished. Some diastolic dysfunction as well. Echo reviewed and showed normal LV function. 2. Peripheral Vascular Disease  Notes: he has some symptoms of claudication but also has symptoms of nonvascular leg pain. He also has issues with spinal stenosis  and arthritis. Given his renal insufficiency, I don't think it's worthwhile trying to revascularize his lower extremity vasculature. I think he would likely still have discomfort since much of his pain sounds nonvascular. Claudication improved with cardiac rehab. ; Also with carotid disease-plan Doppler.   3. Essential hypertension, benign  Continue Metoprolol Tartrate Tablet, 25 MG, 1 tablet, Orally, Twice a day Notes: controlled. Elevated today but controlled at rehab. 4. Coronary atherosclerosis of native coronary artery  Notes: no angina. Status post diagonal stent at the time of TAVR. Status post CABG.  5. anemia/atrial fibrillation: Given his history of GI bleeding, we have held off on anticoagulation. He was on both aspirin and warfarin at the time of his bleed. Due to the risk of stroke, we'll plan on starting Eliquis 2.5 mg by mouth twice a day. No aspirin. His hemoglobin was 15 in December. His creatinine was 1.9 in December 2015.  Stop metoprolol due to bradycardia.  Current medicines are reviewed at length with the patient today.  The patient concerns regarding his medicines were addressed.  The following changes have been made:  Start Eliquis, stop metoprolol  Labs/ tests ordered today include: BMet CBC  No orders of the defined types were placed in this encounter.    Recommend 150 minutes/week of aerobic exercise Low fat, low carb, high fiber diet  recommended  Disposition:   FU in 3 months   Teresita Madura., MD  09/10/2014 9:06 AM    [facility] [place], [place], [place]  [address] Phone: [phone]; Fax: [phone]

## 2014-09-10 NOTE — Telephone Encounter (Signed)
Informed pt of lab results.  Please see BMET from 09/10/14.

## 2014-09-10 NOTE — Telephone Encounter (Signed)
Follow Up ° °Pt returned call//  °

## 2014-09-11 ENCOUNTER — Telehealth: Payer: Self-pay

## 2014-09-11 ENCOUNTER — Encounter (HOSPITAL_COMMUNITY)
Admission: RE | Admit: 2014-09-11 | Discharge: 2014-09-11 | Disposition: A | Discharge status: Home / Self Care | Payer: Self-pay | Source: Ambulatory Visit | Attending: Interventional Cardiology | Admitting: Interventional Cardiology

## 2014-09-11 NOTE — Telephone Encounter (Signed)
Authorization for Eliquis denied per [employer]. Appeal sent. Will notify patient.

## 2014-09-12 ENCOUNTER — Encounter (HOSPITAL_COMMUNITY)
Admission: RE | Admit: 2014-09-12 | Discharge: 2014-09-12 | Disposition: A | Discharge status: Home / Self Care | Payer: Self-pay | Source: Ambulatory Visit | Attending: Interventional Cardiology | Admitting: Interventional Cardiology

## 2014-09-13 ENCOUNTER — Other Ambulatory Visit (INDEPENDENT_AMBULATORY_CARE_PROVIDER_SITE_OTHER): Payer: Medicare Other | Admitting: *Deleted

## 2014-09-13 ENCOUNTER — Encounter (HOSPITAL_COMMUNITY)
Admission: RE | Admit: 2014-09-13 | Discharge: 2014-09-13 | Disposition: A | Discharge status: Home / Self Care | Payer: Self-pay | Source: Ambulatory Visit | Attending: Interventional Cardiology | Admitting: Interventional Cardiology

## 2014-09-13 DIAGNOSIS — I10 Essential (primary) hypertension: Secondary | ICD-10-CM | POA: Diagnosis not present

## 2014-09-13 DIAGNOSIS — R748 Abnormal levels of other serum enzymes: Secondary | ICD-10-CM

## 2014-09-13 DIAGNOSIS — R7989 Other specified abnormal findings of blood chemistry: Secondary | ICD-10-CM

## 2014-09-13 LAB — BASIC METABOLIC PANEL
BUN: 49 mg/dL — ABNORMAL HIGH (ref 6–23)
CALCIUM: 9.3 mg/dL (ref 8.4–10.5)
CHLORIDE: 108 meq/L (ref 96–112)
CO2: 22 meq/L (ref 19–32)
CREATININE: 2.09 mg/dL — AB (ref 0.40–1.50)
GFR: 32.48 mL/min — ABNORMAL LOW (ref >60.00)
Glucose, Bld: 103 mg/dL — ABNORMAL HIGH (ref 70–99)
Potassium: 4 mEq/L (ref 3.5–5.1)
SODIUM: 139 meq/L (ref 135–145)

## 2014-09-18 ENCOUNTER — Encounter (HOSPITAL_COMMUNITY)
Admission: RE | Admit: 2014-09-18 | Discharge: 2014-09-18 | Disposition: A | Discharge status: Home / Self Care | Payer: Self-pay | Source: Ambulatory Visit | Attending: Interventional Cardiology | Admitting: Interventional Cardiology

## 2014-09-18 ENCOUNTER — Other Ambulatory Visit: Payer: Self-pay | Admitting: *Deleted

## 2014-09-18 ENCOUNTER — Telehealth: Payer: Self-pay | Admitting: Cardiovascular Disease

## 2014-09-18 MED ORDER — FUROSEMIDE 40 MG PO TABS: 40.0000 mg | ORAL_TABLET | ORAL | Status: DC

## 2014-09-18 NOTE — Telephone Encounter (Signed)
Pt  rtn call -will only be at this number for an [phone]

## 2014-09-18 NOTE — Telephone Encounter (Signed)
Returned pt's call and informed him of lab results and new orders for Furosemide. Pt verbalized understanding.

## 2014-09-20 ENCOUNTER — Telehealth: Payer: Self-pay | Admitting: Interventional Cardiology

## 2014-09-20 ENCOUNTER — Telehealth: Payer: Self-pay

## 2014-09-20 ENCOUNTER — Encounter (HOSPITAL_COMMUNITY)
Admission: RE | Admit: 2014-09-20 | Discharge: 2014-09-20 | Disposition: A | Discharge status: Home / Self Care | Payer: Self-pay | Source: Ambulatory Visit | Attending: Interventional Cardiology | Admitting: Interventional Cardiology

## 2014-09-20 DIAGNOSIS — I2510 Atherosclerotic heart disease of native coronary artery without angina pectoris: Secondary | ICD-10-CM | POA: Insufficient documentation

## 2014-09-20 DIAGNOSIS — Z952 Presence of prosthetic heart valve: Secondary | ICD-10-CM | POA: Insufficient documentation

## 2014-09-20 DIAGNOSIS — I10 Essential (primary) hypertension: Secondary | ICD-10-CM | POA: Insufficient documentation

## 2014-09-20 DIAGNOSIS — I4891 Unspecified atrial fibrillation: Secondary | ICD-10-CM | POA: Insufficient documentation

## 2014-09-20 DIAGNOSIS — Z5189 Encounter for other specified aftercare: Secondary | ICD-10-CM | POA: Insufficient documentation

## 2014-09-20 NOTE — Telephone Encounter (Signed)
Written appeal by patient and Dr. Irish Lack mailed Part [place] Grievance and Appeals Dept.

## 2014-09-20 NOTE — Telephone Encounter (Signed)
New message      Pt request to talk to [place].  He would not tell me what he wanted

## 2014-09-20 NOTE — Telephone Encounter (Signed)
Pt calling wanting to know if we were aware that he was denied for Eliquis. Informed pt that we had received the denial letter and that it was turned over to our PA nurse, Jenean Lindau to initiate the appeals process. Pt verbalized understanding and was very appreciative.

## 2014-09-25 ENCOUNTER — Encounter (HOSPITAL_COMMUNITY)
Admission: RE | Admit: 2014-09-25 | Discharge: 2014-09-25 | Disposition: A | Discharge status: Home / Self Care | Payer: Self-pay | Source: Ambulatory Visit | Attending: Interventional Cardiology | Admitting: Interventional Cardiology

## 2014-09-26 ENCOUNTER — Encounter (HOSPITAL_COMMUNITY)
Admission: RE | Admit: 2014-09-26 | Discharge: 2014-09-26 | Disposition: A | Discharge status: Home / Self Care | Payer: Self-pay | Source: Ambulatory Visit | Attending: Interventional Cardiology | Admitting: Interventional Cardiology

## 2014-09-27 ENCOUNTER — Encounter (HOSPITAL_COMMUNITY)
Admission: RE | Admit: 2014-09-27 | Discharge: 2014-09-27 | Disposition: A | Discharge status: Home / Self Care | Payer: Self-pay | Source: Ambulatory Visit | Attending: Interventional Cardiology | Admitting: Interventional Cardiology

## 2014-10-02 ENCOUNTER — Telehealth: Payer: Self-pay

## 2014-10-02 ENCOUNTER — Encounter (HOSPITAL_COMMUNITY)
Admission: RE | Admit: 2014-10-02 | Discharge: 2014-10-02 | Disposition: A | Discharge status: Home / Self Care | Payer: Self-pay | Source: Ambulatory Visit | Attending: Interventional Cardiology | Admitting: Interventional Cardiology

## 2014-10-02 ENCOUNTER — Telehealth: Payer: Self-pay | Admitting: Interventional Cardiology

## 2014-10-02 NOTE — Telephone Encounter (Signed)
Eliquis has been denied due to the fact that patient has an aortic valve replacement. It is not FDA approved for this. Will forward to Dr. Irish Lack.

## 2014-10-02 NOTE — Telephone Encounter (Signed)
Prior auth for Eliquis re-sent to [facility] Rx drug plan. Samples of Eliquis 5 mg one box of 14 provided to patient.

## 2014-10-02 NOTE — Telephone Encounter (Signed)
error 

## 2014-10-03 ENCOUNTER — Encounter (HOSPITAL_COMMUNITY)
Admission: RE | Admit: 2014-10-03 | Discharge: 2014-10-03 | Disposition: A | Discharge status: Home / Self Care | Payer: Self-pay | Source: Ambulatory Visit | Attending: Interventional Cardiology | Admitting: Interventional Cardiology

## 2014-10-04 ENCOUNTER — Encounter (HOSPITAL_COMMUNITY)
Admission: RE | Admit: 2014-10-04 | Discharge: 2014-10-04 | Disposition: A | Discharge status: Home / Self Care | Payer: Self-pay | Source: Ambulatory Visit | Attending: Interventional Cardiology | Admitting: Interventional Cardiology

## 2014-10-04 ENCOUNTER — Telehealth: Payer: Self-pay

## 2014-10-04 NOTE — Telephone Encounter (Signed)
OK.  Thanks.  THe other history is that he had a GI bleed on Coumadin.  That is partly why we want to change to low dose Eliquis.

## 2014-10-04 NOTE — Telephone Encounter (Signed)
Letter for appeal received for denial of Eliquis again denying it.  Per Dr. Irish Lack, patient had A fib 6-7 years prior to aortic stenosis. I called [employer], spoke with Richardson Landry who could not help. He stated it needed to go to [employer] Part D. I have the forms for an independent review, and will send them.

## 2014-10-05 NOTE — Telephone Encounter (Signed)
Spoke with patient today to inform him of need for his written consent to designate  Me as his representative in order to send his appeal for Eliquis to an independent review at Medicare Part D. He is out of town but will bring note in upon his return.

## 2014-10-05 NOTE — Telephone Encounter (Signed)
Follow up ° ° ° ° ° °Returning Linda's call °

## 2014-10-09 ENCOUNTER — Encounter (HOSPITAL_COMMUNITY)
Admission: RE | Admit: 2014-10-09 | Discharge: 2014-10-09 | Disposition: A | Discharge status: Home / Self Care | Payer: Self-pay | Source: Ambulatory Visit | Attending: Interventional Cardiology | Admitting: Interventional Cardiology

## 2014-10-10 ENCOUNTER — Encounter (HOSPITAL_COMMUNITY)
Admission: RE | Admit: 2014-10-10 | Discharge: 2014-10-10 | Disposition: A | Discharge status: Home / Self Care | Payer: Self-pay | Source: Ambulatory Visit | Attending: Interventional Cardiology | Admitting: Interventional Cardiology

## 2014-10-11 ENCOUNTER — Encounter (HOSPITAL_COMMUNITY)
Admission: RE | Admit: 2014-10-11 | Discharge: 2014-10-11 | Disposition: A | Discharge status: Home / Self Care | Payer: Self-pay | Source: Ambulatory Visit | Attending: Interventional Cardiology | Admitting: Interventional Cardiology

## 2014-10-15 ENCOUNTER — Other Ambulatory Visit: Payer: Self-pay

## 2014-10-16 ENCOUNTER — Encounter (HOSPITAL_COMMUNITY): Admission: RE | Admit: 2014-10-16 | Payer: Self-pay | Source: Ambulatory Visit

## 2014-10-17 ENCOUNTER — Encounter (HOSPITAL_COMMUNITY)
Admission: RE | Admit: 2014-10-17 | Discharge: 2014-10-17 | Disposition: A | Discharge status: Home / Self Care | Payer: Self-pay | Source: Ambulatory Visit | Attending: Interventional Cardiology | Admitting: Interventional Cardiology

## 2014-10-18 ENCOUNTER — Encounter (HOSPITAL_COMMUNITY)
Admission: RE | Admit: 2014-10-18 | Discharge: 2014-10-18 | Disposition: A | Discharge status: Home / Self Care | Payer: Self-pay | Source: Ambulatory Visit | Attending: Interventional Cardiology | Admitting: Interventional Cardiology

## 2014-10-23 ENCOUNTER — Encounter (HOSPITAL_COMMUNITY): Payer: PRIVATE HEALTH INSURANCE

## 2014-10-23 DIAGNOSIS — Z5189 Encounter for other specified aftercare: Secondary | ICD-10-CM | POA: Insufficient documentation

## 2014-10-23 DIAGNOSIS — I2510 Atherosclerotic heart disease of native coronary artery without angina pectoris: Secondary | ICD-10-CM | POA: Insufficient documentation

## 2014-10-23 DIAGNOSIS — I10 Essential (primary) hypertension: Secondary | ICD-10-CM | POA: Insufficient documentation

## 2014-10-23 DIAGNOSIS — I4891 Unspecified atrial fibrillation: Secondary | ICD-10-CM | POA: Insufficient documentation

## 2014-10-23 DIAGNOSIS — Z952 Presence of prosthetic heart valve: Secondary | ICD-10-CM | POA: Insufficient documentation

## 2014-10-24 ENCOUNTER — Encounter (HOSPITAL_COMMUNITY): Payer: Self-pay

## 2014-10-25 ENCOUNTER — Encounter (HOSPITAL_COMMUNITY): Payer: Self-pay

## 2014-10-30 ENCOUNTER — Encounter (HOSPITAL_COMMUNITY): Payer: Self-pay

## 2014-10-31 ENCOUNTER — Encounter (HOSPITAL_COMMUNITY): Payer: Self-pay

## 2014-11-01 ENCOUNTER — Encounter (HOSPITAL_COMMUNITY): Payer: Self-pay

## 2014-11-06 ENCOUNTER — Encounter (HOSPITAL_COMMUNITY): Payer: Self-pay

## 2014-11-07 ENCOUNTER — Encounter (HOSPITAL_COMMUNITY): Payer: Self-pay

## 2014-11-08 ENCOUNTER — Telehealth: Payer: Self-pay

## 2014-11-08 ENCOUNTER — Encounter (HOSPITAL_COMMUNITY): Payer: Self-pay

## 2014-11-08 NOTE — Telephone Encounter (Signed)
Patient inquiring about appeal for Eliquis. Advised him I have not seen or heard anything back yet. Samples of Eliquis 2.5mg  provided; 4 boxes.

## 2014-11-13 ENCOUNTER — Encounter (HOSPITAL_COMMUNITY)
Admission: RE | Admit: 2014-11-13 | Discharge: 2014-11-13 | Disposition: A | Discharge status: Home / Self Care | Payer: Self-pay | Source: Ambulatory Visit | Attending: Interventional Cardiology | Admitting: Interventional Cardiology

## 2014-11-14 ENCOUNTER — Encounter (HOSPITAL_COMMUNITY)
Admission: RE | Admit: 2014-11-14 | Discharge: 2014-11-14 | Disposition: A | Discharge status: Home / Self Care | Payer: Self-pay | Source: Ambulatory Visit | Attending: Interventional Cardiology | Admitting: Interventional Cardiology

## 2014-11-15 ENCOUNTER — Encounter (HOSPITAL_COMMUNITY)
Admission: RE | Admit: 2014-11-15 | Discharge: 2014-11-15 | Disposition: A | Discharge status: Home / Self Care | Payer: Self-pay | Source: Ambulatory Visit | Attending: Interventional Cardiology | Admitting: Interventional Cardiology

## 2014-11-20 ENCOUNTER — Encounter (HOSPITAL_COMMUNITY)
Admission: RE | Admit: 2014-11-20 | Discharge: 2014-11-20 | Disposition: A | Discharge status: Home / Self Care | Payer: Self-pay | Source: Ambulatory Visit | Attending: Interventional Cardiology | Admitting: Interventional Cardiology

## 2014-11-20 DIAGNOSIS — I4891 Unspecified atrial fibrillation: Secondary | ICD-10-CM | POA: Insufficient documentation

## 2014-11-20 DIAGNOSIS — I10 Essential (primary) hypertension: Secondary | ICD-10-CM | POA: Insufficient documentation

## 2014-11-20 DIAGNOSIS — Z952 Presence of prosthetic heart valve: Secondary | ICD-10-CM | POA: Insufficient documentation

## 2014-11-20 DIAGNOSIS — I2510 Atherosclerotic heart disease of native coronary artery without angina pectoris: Secondary | ICD-10-CM | POA: Insufficient documentation

## 2014-11-20 DIAGNOSIS — Z5189 Encounter for other specified aftercare: Secondary | ICD-10-CM | POA: Insufficient documentation

## 2014-11-21 ENCOUNTER — Encounter (HOSPITAL_COMMUNITY): Payer: Self-pay

## 2014-11-22 ENCOUNTER — Encounter (HOSPITAL_COMMUNITY)
Admission: RE | Admit: 2014-11-22 | Discharge: 2014-11-22 | Disposition: A | Discharge status: Home / Self Care | Payer: Self-pay | Source: Ambulatory Visit | Attending: Interventional Cardiology | Admitting: Interventional Cardiology

## 2014-11-27 ENCOUNTER — Encounter (HOSPITAL_COMMUNITY)
Admission: RE | Admit: 2014-11-27 | Discharge: 2014-11-27 | Disposition: A | Discharge status: Home / Self Care | Payer: Self-pay | Source: Ambulatory Visit | Attending: Interventional Cardiology | Admitting: Interventional Cardiology

## 2014-11-28 ENCOUNTER — Encounter (HOSPITAL_COMMUNITY): Payer: Medicare Other

## 2014-11-29 ENCOUNTER — Encounter (HOSPITAL_COMMUNITY): Payer: Self-pay

## 2014-12-04 ENCOUNTER — Telehealth: Payer: Self-pay

## 2014-12-04 ENCOUNTER — Encounter (HOSPITAL_COMMUNITY)
Admission: RE | Admit: 2014-12-04 | Discharge: 2014-12-04 | Disposition: A | Discharge status: Home / Self Care | Payer: Self-pay | Source: Ambulatory Visit | Attending: Interventional Cardiology | Admitting: Interventional Cardiology

## 2014-12-04 NOTE — Telephone Encounter (Signed)
To do an expedited M/C Part D appeal for Eliquis 2.5mg : Call [facility] [phone]. You will get a voicemail, but just follow the prompts. They will require name, address: [address] [place], phone # [phone]. And Medicare HIC # [id number] A. [place] appeal # [id number]. The Rx drug in dispute. Part D Rx plan is [facility]. The reasons why you disagree with this decision. Any other evidence to be submitted. A statement that you are  requesting an expedited appeal. Of course don't forget to leave your name and phone #. Sure hope this helps.

## 2014-12-05 ENCOUNTER — Encounter (HOSPITAL_COMMUNITY)
Admission: RE | Admit: 2014-12-05 | Discharge: 2014-12-05 | Disposition: A | Discharge status: Home / Self Care | Payer: Self-pay | Source: Ambulatory Visit | Attending: Interventional Cardiology | Admitting: Interventional Cardiology

## 2014-12-06 ENCOUNTER — Encounter (HOSPITAL_COMMUNITY)
Admission: RE | Admit: 2014-12-06 | Discharge: 2014-12-06 | Disposition: A | Discharge status: Home / Self Care | Payer: Self-pay | Source: Ambulatory Visit | Attending: Interventional Cardiology | Admitting: Interventional Cardiology

## 2014-12-10 NOTE — Telephone Encounter (Signed)
OK. I will try this.  Please give him some samples if we can in the mean time.

## 2014-12-10 NOTE — Telephone Encounter (Signed)
I certainly will.

## 2014-12-11 ENCOUNTER — Encounter (HOSPITAL_COMMUNITY)
Admission: RE | Admit: 2014-12-11 | Discharge: 2014-12-11 | Disposition: A | Discharge status: Home / Self Care | Payer: Self-pay | Source: Ambulatory Visit | Attending: Interventional Cardiology | Admitting: Interventional Cardiology

## 2014-12-12 ENCOUNTER — Encounter (HOSPITAL_COMMUNITY)
Admission: RE | Admit: 2014-12-12 | Discharge: 2014-12-12 | Disposition: A | Discharge status: Home / Self Care | Payer: Self-pay | Source: Ambulatory Visit | Attending: Interventional Cardiology | Admitting: Interventional Cardiology

## 2014-12-13 ENCOUNTER — Encounter: Payer: Self-pay | Admitting: Interventional Cardiology

## 2014-12-13 ENCOUNTER — Telehealth: Payer: Self-pay

## 2014-12-13 ENCOUNTER — Encounter (HOSPITAL_COMMUNITY): Payer: Self-pay

## 2014-12-13 ENCOUNTER — Telehealth: Payer: Self-pay | Admitting: Interventional Cardiology

## 2014-12-13 ENCOUNTER — Ambulatory Visit (INDEPENDENT_AMBULATORY_CARE_PROVIDER_SITE_OTHER): Payer: Medicare Other | Admitting: Interventional Cardiology

## 2014-12-13 VITALS — BP 164/62 | HR 70 | Ht 69.0 in | Wt 169.6 lb

## 2014-12-13 DIAGNOSIS — I10 Essential (primary) hypertension: Secondary | ICD-10-CM | POA: Diagnosis not present

## 2014-12-13 DIAGNOSIS — I481 Persistent atrial fibrillation: Secondary | ICD-10-CM | POA: Diagnosis not present

## 2014-12-13 DIAGNOSIS — I4819 Other persistent atrial fibrillation: Secondary | ICD-10-CM

## 2014-12-13 DIAGNOSIS — I359 Nonrheumatic aortic valve disorder, unspecified: Secondary | ICD-10-CM | POA: Diagnosis not present

## 2014-12-13 MED ORDER — AMLODIPINE BESYLATE 5 MG PO TABS: 5.0000 mg | ORAL_TABLET | Freq: Every day | ORAL | Status: DC

## 2014-12-13 NOTE — Telephone Encounter (Signed)
Follow up  Catrice returning [place] phone call- gave correct phone #: [phone]

## 2014-12-13 NOTE — Telephone Encounter (Signed)
See phone note from 12/13/14

## 2014-12-13 NOTE — Telephone Encounter (Signed)
Spoke with Catrice at [facility]. Scheduled hearing for 12/26/14 for pt's Eliquis.

## 2014-12-13 NOTE — Telephone Encounter (Addendum)
Spoke with Catrice at [facility] and she said that the message was received and that they needed to schedule the hearing. Catrice said that she can be reached at [phone].

## 2014-12-13 NOTE — Telephone Encounter (Signed)
I left a message on the answering machine with all of the information on 12/11/14.  Is there a way for [place] to f/u with thiem to see that they got the info?

## 2014-12-13 NOTE — Patient Instructions (Signed)
**Note De-Identified [name] Obfuscation** Medication Instructions:  Increase Amlodipine to 5 mg daily  Labwork: None  Testing/Procedures: None  Follow-Up: Your physician wants you to follow-up in: 6 months. You will receive a reminder letter in the mail two months in advance. If you don't receive a letter, please call our office to schedule the follow-up appointment.

## 2014-12-13 NOTE — Telephone Encounter (Addendum)
**Note De-Identified Akylah Hascall Obfuscation** While the pt was at [place] with Dr Irish Lack this morning he requested samples of Eliquis but I was unaware until he left the office. I called to let the pt know that I have them and that he can pick them up at our front office or Dr Irish Lack will leave theam at cardiac rehab for the pt to receive at his next appt there next Tuesday. The pt request that Dr Irish Lack leave them for him to pick up at cardiac rehab. Dr Irish Lack is aware and has been given the pts Eliquis samples to deliver to cardiac rehab.

## 2014-12-13 NOTE — Telephone Encounter (Signed)
Left message on answering service to please let me or Jenean Lindau know if they got the message and all info needed.

## 2014-12-13 NOTE — Progress Notes (Signed)
Patient ID: [name], male   DOB: [birth year], 81 y.o.   MRN: [id number]     Cardiology Office Note   Date:  12/13/2014   ID:  [name], DOB 07/29/1930, MRN [id number]  PCP:  Parker Argyle, MD    No chief complaint on file. f/u bradycardia   Wt Readings from Last 3 Encounters:  12/13/14 169 lb 9.6 oz (76.93 kg)  09/10/14 172 lb 3.2 oz (78.109 kg)  02/14/14 167 lb 1.9 oz (75.805 kg)       History of Present Illness: [name] is a 81 y.o. male  who had TAVR done in 2013, with a h/o CAD with CABG in 2001 and PCI in 2013. He had severe anemia in 3/15 requiring transfusion. GI workup was negative. He is not doing cardiac rehab. He is using an inhaler. Prior MRI showed spinal stenosis. He has had gout as well in his feet. He is now treated for OSA. No longer Using Metolazone when he feels fluid overload. He weighs himself and looks for fluid overload corresponding to an increase in weight by 3 lbs.  CAD/ASCVD:  Regularly Exercising at cardiac rehab. No chest pain. Denies : Chest pain.  Reports Dyspnea on exertion. Related to emphysema Improving Leg edema.  No problems with using exercise bike.   He notes some unsteadiness. He has been off of Coumadin since the anemia. We restarted low-dose Eliquis. He has tolerated it well. His insurance will not pay for it at this point. We are appealing.  At his last visit, he was bradycardic. He denied symptoms but we stopped his metoprolol. He continues to not have any issues with lightheadedness or syncope. His balance is off but he thinks this is related to age. He walks with a cane.    Past Medical History  Diagnosis Date  . Aortic valve disorder     s/p [facility]AVR at [facility]   . PVD (peripheral vascular disease)   . Hypercholesteremia   . HTN (hypertension)   . Atrial fibrillation     Chronic  . Atrial flutter   . CKD (chronic kidney disease)   . AAA (abdominal aortic aneurysm)   . CAD (coronary artery disease)       Status post CABG, stent to diagonal prior to TAVR  . Bladder cancer   . Gallstone   . Vitamin B12 deficiency   . Colon cancer   . Carotid stenosis   . Gout     Past Surgical History  Procedure Laterality Date  . Coronary artery bypass graft    . Laminectomy    . Appendectomy    . Colectomy    . Toe surgery    . Hemicolectomy    . Mohs surgery    . Angioplasty    . Esophagogastroduodenoscopy N/A 06/28/2013    Procedure: ESOPHAGOGASTRODUODENOSCOPY (EGD);  Surgeon: Winfield Cunas., MD;  Location: [facility] ENDOSCOPY;  Service: Endoscopy;  Laterality: N/A;  . Colonoscopy N/A 06/30/2013    Procedure: COLONOSCOPY;  Surgeon: Winfield Cunas., MD;  Location: [facility] ENDOSCOPY;  Service: Endoscopy;  Laterality: N/A;  . Colonoscopy with esophagogastroduodenoscopy (egd) N/A 07/03/2013    Procedure: COLONOSCOPY ;  Surgeon: Missy Sabins, MD;  Location: [place];  Service: Endoscopy;  Laterality: N/A;  Patient is already sedated on the ventilator and may need minimal if any additional sedation.  Please do at bedside.  Will place NG and give some Nulytely as prep.  . Esophagogastroduodenoscopy N/A 07/03/2013  Procedure: ESOPHAGOGASTRODUODENOSCOPY (EGD);  Surgeon: Missy Sabins, MD;  Location: [facility] ENDOSCOPY;  Service: Endoscopy;  Laterality: N/A;  . Givens capsule study N/A 07/03/2013    Procedure: GIVENS CAPSULE STUDY;  Surgeon: Missy Sabins, MD;  Location: [place];  Service: Endoscopy;  Laterality: N/A;     Current Outpatient Prescriptions  Medication Sig Dispense Refill  . albuterol (PROVENTIL HFA;VENTOLIN HFA) 108 (90 BASE) MCG/ACT inhaler Inhale 2 puffs into the lungs every 6 (six) hours as needed for wheezing or shortness of breath.    . allopurinol (ZYLOPRIM) 100 MG tablet Take 100 mg by mouth 2 (two) times daily.     Marland Kitchen amLODipine (NORVASC) 2.5 MG tablet Take 2.5 mg by mouth daily.    Marland Kitchen apixaban (ELIQUIS) 2.5 MG TABS tablet Take 1 tablet (2.5 mg total) by mouth 2 (two) times daily.  60 tablet 3  . atorvastatin (LIPITOR) 40 MG tablet Take 40 mg by mouth daily at 6 PM.     . citric acid-sodium citrate (ORACIT) 334-500 MG/5ML solution Take 15 mLs by mouth every morning. 1 Tablespoon Daily    . Cyanocobalamin (VITAMIN B-12 IJ) Inject 1 each as directed every 30 (thirty) days.    . famotidine (TH FAMOTIDINE 10) 10 MG tablet Take 10 mg by mouth every morning.    . febuxostat (ULORIC) 40 MG tablet Take 40 mg by mouth every morning.    . ferrous sulfate 325 (65 FE) MG EC tablet Take 1 tablet (325 mg total) by mouth daily with breakfast. 30 tablet   . furosemide (LASIX) 40 MG tablet Take 1 tablet (40 mg total) by mouth every other day. 60 tablet 3  . predniSONE (DELTASONE) 10 MG tablet Take 10 mg by mouth daily as needed (For gout).    . Tiotropium Bromide Monohydrate (SPIRIVA HANDIHALER IN) Inhale 2 puffs into the lungs daily.     No current facility-administered medications for this visit.    Allergies:   Ramipril    Social History:  The patient  reports that he has quit smoking. He has never used smokeless tobacco. He reports that he drinks alcohol. He reports that he does not use illicit drugs.   Family History:  The patient's family history includes Cancer in his brother and mother; Diabetes in his father; Heart disease in his father.    ROS:  Please see the history of present illness.   Otherwise, review of systems are positive for decreased balance.   All other systems are reviewed and negative.    PHYSICAL EXAM: VS:  BP 164/62 mmHg  Pulse 70  Ht _0  (1.753 m)  Wt 169 lb 9.6 oz (76.93 kg)  BMI 25.03 kg/m2  SpO2 95% , BMI Body mass index is 25.03 kg/(m^2). GEN: Well nourished, well developed, in no acute distress HEENT: normal Neck: no JVD, carotid bruits, or masses Cardiac: Irregularly irregular; no murmurs, rubs, or gallops, trace bilateral edema  Respiratory:  clear to auscultation bilaterally, normal work of breathing GI: soft, nontender, nondistended, +  BS MS: no deformity or atrophy Skin: warm and dry, no rash Neuro:  Strength and sensation are intact Psych: euthymic mood, full affect      Recent Labs: 09/10/2014: Hemoglobin 14.4; Platelets 141.0* 09/13/2014: BUN 49*; Creatinine, Ser 2.09*; Potassium 4.0; Sodium 139   Lipid Panel No results found for: CHOL, TRIG, HDL, CHOLHDL, VLDL, LDLCALC, LDLDIRECT   Other studies Reviewed: Additional studies/ records that were reviewed today with results demonstrating: .   ASSESSMENT AND  PLAN:  1. A. fib: He had had bradycardia as well. This has resolved since stopping the metoprolol. No symptoms of bradycardia. We are still appealing for Eliquis to be approved for stroke prevention given the fact that his atrial fibrillation predates his valvular heart disease. He has had GI bleeding on Coumadin as well. I met him in 2007 and he had a cardioversion at that time. He had his aortic valve replacement many years later. 2. Hypertension: Blood pressure elevated today. He says his blood pressure is variable when checked at rehabilitation. Will increase his amlodipine to 5 mg daily. 3. No ACE due to renal insufficiency. 4. PAD: Follow carotid Doppler.  LE arterial disease is not limiting his walking at this time.    Current medicines are reviewed at length with the patient today.  The patient concerns regarding his medicines were addressed.  The following changes have been made: As above  Labs/ tests ordered today include: none No orders of the defined types were placed in this encounter.    Recommend 150 minutes/week of aerobic exercise Low fat, low carb, high fiber diet recommended  Disposition:   FU in 6 months   Teresita Madura., MD  12/13/2014 8:59 AM    [facility] [place], [place], [place]  [address] Phone: [phone]; Fax: [phone]

## 2014-12-18 ENCOUNTER — Encounter (HOSPITAL_COMMUNITY)
Admission: RE | Admit: 2014-12-18 | Discharge: 2014-12-18 | Disposition: A | Discharge status: Home / Self Care | Payer: Self-pay | Source: Ambulatory Visit | Attending: Interventional Cardiology | Admitting: Interventional Cardiology

## 2014-12-19 ENCOUNTER — Encounter (HOSPITAL_COMMUNITY)
Admission: RE | Admit: 2014-12-19 | Discharge: 2014-12-19 | Disposition: A | Discharge status: Home / Self Care | Payer: Self-pay | Source: Ambulatory Visit | Attending: Interventional Cardiology | Admitting: Interventional Cardiology

## 2014-12-20 ENCOUNTER — Encounter (HOSPITAL_COMMUNITY)
Admission: RE | Admit: 2014-12-20 | Discharge: 2014-12-20 | Disposition: A | Discharge status: Home / Self Care | Payer: Self-pay | Source: Ambulatory Visit | Attending: Interventional Cardiology | Admitting: Interventional Cardiology

## 2014-12-20 DIAGNOSIS — I10 Essential (primary) hypertension: Secondary | ICD-10-CM | POA: Insufficient documentation

## 2014-12-20 DIAGNOSIS — Z5189 Encounter for other specified aftercare: Secondary | ICD-10-CM | POA: Insufficient documentation

## 2014-12-20 DIAGNOSIS — I2510 Atherosclerotic heart disease of native coronary artery without angina pectoris: Secondary | ICD-10-CM | POA: Insufficient documentation

## 2014-12-20 DIAGNOSIS — I4891 Unspecified atrial fibrillation: Secondary | ICD-10-CM | POA: Insufficient documentation

## 2014-12-20 DIAGNOSIS — Z952 Presence of prosthetic heart valve: Secondary | ICD-10-CM | POA: Insufficient documentation

## 2014-12-25 ENCOUNTER — Encounter (HOSPITAL_COMMUNITY)
Admission: RE | Admit: 2014-12-25 | Discharge: 2014-12-25 | Disposition: A | Discharge status: Home / Self Care | Payer: Self-pay | Source: Ambulatory Visit | Attending: Interventional Cardiology | Admitting: Interventional Cardiology

## 2014-12-26 ENCOUNTER — Encounter (HOSPITAL_COMMUNITY)
Admission: RE | Admit: 2014-12-26 | Discharge: 2014-12-26 | Disposition: A | Discharge status: Home / Self Care | Payer: Self-pay | Source: Ambulatory Visit | Attending: Interventional Cardiology | Admitting: Interventional Cardiology

## 2014-12-27 ENCOUNTER — Encounter (HOSPITAL_COMMUNITY)
Admission: RE | Admit: 2014-12-27 | Discharge: 2014-12-27 | Disposition: A | Discharge status: Home / Self Care | Payer: Self-pay | Source: Ambulatory Visit | Attending: Interventional Cardiology | Admitting: Interventional Cardiology

## 2015-01-01 ENCOUNTER — Encounter (HOSPITAL_COMMUNITY)
Admission: RE | Admit: 2015-01-01 | Discharge: 2015-01-01 | Disposition: A | Discharge status: Home / Self Care | Payer: Self-pay | Source: Ambulatory Visit | Attending: Interventional Cardiology | Admitting: Interventional Cardiology

## 2015-01-02 ENCOUNTER — Encounter (HOSPITAL_COMMUNITY): Payer: Self-pay

## 2015-01-03 ENCOUNTER — Encounter (HOSPITAL_COMMUNITY)
Admission: RE | Admit: 2015-01-03 | Discharge: 2015-01-03 | Disposition: A | Discharge status: Home / Self Care | Payer: Self-pay | Source: Ambulatory Visit | Attending: Interventional Cardiology | Admitting: Interventional Cardiology

## 2015-01-08 ENCOUNTER — Encounter (HOSPITAL_COMMUNITY)
Admission: RE | Admit: 2015-01-08 | Discharge: 2015-01-08 | Disposition: A | Discharge status: Home / Self Care | Payer: Self-pay | Source: Ambulatory Visit | Attending: Interventional Cardiology | Admitting: Interventional Cardiology

## 2015-01-09 ENCOUNTER — Encounter (HOSPITAL_COMMUNITY): Payer: Self-pay

## 2015-01-10 ENCOUNTER — Encounter (HOSPITAL_COMMUNITY)
Admission: RE | Admit: 2015-01-10 | Discharge: 2015-01-10 | Disposition: A | Discharge status: Home / Self Care | Payer: Self-pay | Source: Ambulatory Visit | Attending: Interventional Cardiology | Admitting: Interventional Cardiology

## 2015-01-15 ENCOUNTER — Encounter (HOSPITAL_COMMUNITY): Payer: Self-pay

## 2015-01-16 ENCOUNTER — Encounter (HOSPITAL_COMMUNITY)
Admission: RE | Admit: 2015-01-16 | Discharge: 2015-01-16 | Disposition: A | Discharge status: Home / Self Care | Payer: Self-pay | Source: Ambulatory Visit | Attending: Interventional Cardiology | Admitting: Interventional Cardiology

## 2015-01-17 ENCOUNTER — Telehealth: Payer: Self-pay | Admitting: Interventional Cardiology

## 2015-01-17 ENCOUNTER — Encounter (HOSPITAL_COMMUNITY)
Admission: RE | Admit: 2015-01-17 | Discharge: 2015-01-17 | Disposition: A | Discharge status: Home / Self Care | Payer: Self-pay | Source: Ambulatory Visit | Attending: Interventional Cardiology | Admitting: Interventional Cardiology

## 2015-01-17 NOTE — Telephone Encounter (Signed)
New message      Calling to talk to [place].  Please call Friday when you return

## 2015-01-18 ENCOUNTER — Other Ambulatory Visit: Payer: Self-pay

## 2015-01-18 MED ORDER — APIXABAN 2.5 MG PO TABS: 2.5000 mg | ORAL_TABLET | Freq: Two times a day (BID) | ORAL | Status: AC

## 2015-01-18 NOTE — Telephone Encounter (Signed)
Patient calls to inform us that he received a letter from his [employer], now approving Eliquis. Request that we send a 90 day supply to [facility]. I have done that.

## 2015-01-22 ENCOUNTER — Telehealth: Payer: Self-pay | Admitting: Interventional Cardiology

## 2015-01-22 ENCOUNTER — Encounter (HOSPITAL_COMMUNITY)
Admission: RE | Admit: 2015-01-22 | Discharge: 2015-01-22 | Disposition: A | Discharge status: Home / Self Care | Payer: Self-pay | Source: Ambulatory Visit | Attending: Interventional Cardiology | Admitting: Interventional Cardiology

## 2015-01-22 DIAGNOSIS — I4891 Unspecified atrial fibrillation: Secondary | ICD-10-CM | POA: Insufficient documentation

## 2015-01-22 DIAGNOSIS — Z952 Presence of prosthetic heart valve: Secondary | ICD-10-CM | POA: Insufficient documentation

## 2015-01-22 DIAGNOSIS — I10 Essential (primary) hypertension: Secondary | ICD-10-CM | POA: Insufficient documentation

## 2015-01-22 DIAGNOSIS — I2510 Atherosclerotic heart disease of native coronary artery without angina pectoris: Secondary | ICD-10-CM | POA: Insufficient documentation

## 2015-01-22 DIAGNOSIS — Z5189 Encounter for other specified aftercare: Secondary | ICD-10-CM | POA: Insufficient documentation

## 2015-01-22 NOTE — Telephone Encounter (Signed)
Eliquis approved by Lollie Sails.

## 2015-01-23 ENCOUNTER — Encounter (HOSPITAL_COMMUNITY): Payer: Self-pay

## 2015-01-24 ENCOUNTER — Encounter (HOSPITAL_COMMUNITY)
Admission: RE | Admit: 2015-01-24 | Discharge: 2015-01-24 | Disposition: A | Discharge status: Home / Self Care | Payer: Self-pay | Source: Ambulatory Visit | Attending: Interventional Cardiology | Admitting: Interventional Cardiology

## 2015-01-29 ENCOUNTER — Encounter (HOSPITAL_COMMUNITY)
Admission: RE | Admit: 2015-01-29 | Discharge: 2015-01-29 | Disposition: A | Discharge status: Home / Self Care | Payer: Self-pay | Source: Ambulatory Visit | Attending: Interventional Cardiology | Admitting: Interventional Cardiology

## 2015-01-30 ENCOUNTER — Encounter (HOSPITAL_COMMUNITY): Payer: Self-pay

## 2015-01-31 ENCOUNTER — Encounter (HOSPITAL_COMMUNITY)
Admission: RE | Admit: 2015-01-31 | Discharge: 2015-01-31 | Disposition: A | Discharge status: Home / Self Care | Payer: Self-pay | Source: Ambulatory Visit | Attending: Interventional Cardiology | Admitting: Interventional Cardiology

## 2015-02-01 NOTE — Telephone Encounter (Signed)
Awesome!

## 2015-02-04 NOTE — Telephone Encounter (Signed)
Patient notified

## 2015-02-05 ENCOUNTER — Encounter (HOSPITAL_COMMUNITY)
Admission: RE | Admit: 2015-02-05 | Discharge: 2015-02-05 | Disposition: A | Discharge status: Home / Self Care | Payer: Self-pay | Source: Ambulatory Visit | Attending: Interventional Cardiology | Admitting: Interventional Cardiology

## 2015-02-06 ENCOUNTER — Encounter (HOSPITAL_COMMUNITY): Payer: Self-pay

## 2015-02-07 ENCOUNTER — Encounter (HOSPITAL_COMMUNITY): Payer: Self-pay

## 2015-02-12 ENCOUNTER — Encounter (HOSPITAL_COMMUNITY)
Admission: RE | Admit: 2015-02-12 | Discharge: 2015-02-12 | Disposition: A | Discharge status: Home / Self Care | Payer: Self-pay | Source: Ambulatory Visit | Attending: Interventional Cardiology | Admitting: Interventional Cardiology

## 2015-02-13 ENCOUNTER — Encounter (HOSPITAL_COMMUNITY)
Admission: RE | Admit: 2015-02-13 | Discharge: 2015-02-13 | Disposition: A | Discharge status: Home / Self Care | Payer: Self-pay | Source: Ambulatory Visit | Attending: Interventional Cardiology | Admitting: Interventional Cardiology

## 2015-02-14 ENCOUNTER — Encounter (HOSPITAL_COMMUNITY)
Admission: RE | Admit: 2015-02-14 | Discharge: 2015-02-14 | Disposition: A | Discharge status: Home / Self Care | Payer: Self-pay | Source: Ambulatory Visit | Attending: Interventional Cardiology | Admitting: Interventional Cardiology

## 2015-02-19 ENCOUNTER — Encounter (HOSPITAL_COMMUNITY)
Admission: RE | Admit: 2015-02-19 | Discharge: 2015-02-19 | Disposition: A | Discharge status: Home / Self Care | Payer: Self-pay | Source: Ambulatory Visit | Attending: Interventional Cardiology | Admitting: Interventional Cardiology

## 2015-02-19 DIAGNOSIS — Z5189 Encounter for other specified aftercare: Secondary | ICD-10-CM | POA: Insufficient documentation

## 2015-02-19 DIAGNOSIS — I4891 Unspecified atrial fibrillation: Secondary | ICD-10-CM | POA: Insufficient documentation

## 2015-02-19 DIAGNOSIS — I2510 Atherosclerotic heart disease of native coronary artery without angina pectoris: Secondary | ICD-10-CM | POA: Insufficient documentation

## 2015-02-19 DIAGNOSIS — I10 Essential (primary) hypertension: Secondary | ICD-10-CM | POA: Insufficient documentation

## 2015-02-19 DIAGNOSIS — Z952 Presence of prosthetic heart valve: Secondary | ICD-10-CM | POA: Insufficient documentation

## 2015-02-20 ENCOUNTER — Encounter (HOSPITAL_COMMUNITY): Payer: Medicare Other

## 2015-02-21 ENCOUNTER — Encounter (HOSPITAL_COMMUNITY)
Admission: RE | Admit: 2015-02-21 | Discharge: 2015-02-21 | Disposition: A | Discharge status: Home / Self Care | Payer: Self-pay | Source: Ambulatory Visit | Attending: Interventional Cardiology | Admitting: Interventional Cardiology

## 2015-02-26 ENCOUNTER — Encounter (HOSPITAL_COMMUNITY)
Admission: RE | Admit: 2015-02-26 | Discharge: 2015-02-26 | Disposition: A | Discharge status: Home / Self Care | Payer: Self-pay | Source: Ambulatory Visit | Attending: Interventional Cardiology | Admitting: Interventional Cardiology

## 2015-02-27 ENCOUNTER — Telehealth: Payer: Self-pay | Admitting: Interventional Cardiology

## 2015-02-27 ENCOUNTER — Encounter (HOSPITAL_COMMUNITY)
Admission: RE | Admit: 2015-02-27 | Discharge: 2015-02-27 | Disposition: A | Discharge status: Home / Self Care | Payer: Self-pay | Source: Ambulatory Visit | Attending: Interventional Cardiology | Admitting: Interventional Cardiology

## 2015-02-27 NOTE — Telephone Encounter (Signed)
Spoke with pt and gave him instructions from Dr. Irish Lack

## 2015-02-27 NOTE — Telephone Encounter (Signed)
Pt called because he has been having a bloody right eye lasting  a week, then goes away. Pt states this episode has happened 3 times in 1 and 1/2 to 2 months. Pt is taking Eliquis 2.5 mg twice a day. Pt would like to have an appointment with Dr. Irish Lack. Pt does not think that an eye doctor cant do anything for him. Pt wants MD's recommendations.

## 2015-02-27 NOTE — Telephone Encounter (Signed)
Hold Eliquis for a day (2 doses) and then restart.

## 2015-02-27 NOTE — Telephone Encounter (Signed)
New message     Talk to the nurse about his eliquis causing him to have eye problems

## 2015-02-28 ENCOUNTER — Encounter (HOSPITAL_COMMUNITY)
Admission: RE | Admit: 2015-02-28 | Discharge: 2015-02-28 | Disposition: A | Discharge status: Home / Self Care | Payer: Self-pay | Source: Ambulatory Visit | Attending: Interventional Cardiology | Admitting: Interventional Cardiology

## 2015-03-05 ENCOUNTER — Encounter (HOSPITAL_COMMUNITY)
Admission: RE | Admit: 2015-03-05 | Discharge: 2015-03-05 | Disposition: A | Discharge status: Home / Self Care | Payer: Self-pay | Source: Ambulatory Visit | Attending: Interventional Cardiology | Admitting: Interventional Cardiology

## 2015-03-06 ENCOUNTER — Encounter (HOSPITAL_COMMUNITY)
Admission: RE | Admit: 2015-03-06 | Discharge: 2015-03-06 | Disposition: A | Discharge status: Home / Self Care | Payer: Self-pay | Source: Ambulatory Visit | Attending: Interventional Cardiology | Admitting: Interventional Cardiology

## 2015-03-07 ENCOUNTER — Encounter (HOSPITAL_COMMUNITY)
Admission: RE | Admit: 2015-03-07 | Discharge: 2015-03-07 | Disposition: A | Discharge status: Home / Self Care | Payer: Self-pay | Source: Ambulatory Visit | Attending: Interventional Cardiology | Admitting: Interventional Cardiology

## 2015-03-12 ENCOUNTER — Encounter (HOSPITAL_COMMUNITY)
Admission: RE | Admit: 2015-03-12 | Discharge: 2015-03-12 | Disposition: A | Discharge status: Home / Self Care | Payer: Self-pay | Source: Ambulatory Visit | Attending: Interventional Cardiology | Admitting: Interventional Cardiology

## 2015-03-13 ENCOUNTER — Encounter (HOSPITAL_COMMUNITY): Payer: Self-pay

## 2015-03-19 ENCOUNTER — Encounter: Payer: Self-pay | Admitting: Interventional Cardiology

## 2015-03-19 ENCOUNTER — Encounter (HOSPITAL_COMMUNITY): Payer: Self-pay

## 2015-03-20 ENCOUNTER — Encounter (HOSPITAL_COMMUNITY): Payer: Self-pay

## 2015-03-21 ENCOUNTER — Encounter (HOSPITAL_COMMUNITY)
Admission: RE | Admit: 2015-03-21 | Discharge: 2015-03-21 | Disposition: A | Discharge status: Home / Self Care | Payer: Self-pay | Source: Ambulatory Visit | Attending: Interventional Cardiology | Admitting: Interventional Cardiology

## 2015-03-21 DIAGNOSIS — Z48812 Encounter for surgical aftercare following surgery on the circulatory system: Secondary | ICD-10-CM | POA: Insufficient documentation

## 2015-03-21 DIAGNOSIS — I4891 Unspecified atrial fibrillation: Secondary | ICD-10-CM | POA: Insufficient documentation

## 2015-03-21 DIAGNOSIS — Z952 Presence of prosthetic heart valve: Secondary | ICD-10-CM | POA: Insufficient documentation

## 2015-03-22 ENCOUNTER — Encounter (HOSPITAL_COMMUNITY): Payer: Self-pay

## 2015-03-26 ENCOUNTER — Encounter (HOSPITAL_COMMUNITY): Payer: Self-pay

## 2015-03-27 ENCOUNTER — Encounter (HOSPITAL_COMMUNITY): Payer: Self-pay

## 2015-03-28 ENCOUNTER — Encounter (HOSPITAL_COMMUNITY): Payer: Self-pay

## 2015-03-29 ENCOUNTER — Encounter (HOSPITAL_COMMUNITY): Payer: Self-pay

## 2015-04-02 ENCOUNTER — Encounter (HOSPITAL_COMMUNITY)
Admission: RE | Admit: 2015-04-02 | Discharge: 2015-04-02 | Disposition: A | Discharge status: Home / Self Care | Payer: Self-pay | Source: Ambulatory Visit | Attending: Interventional Cardiology | Admitting: Interventional Cardiology

## 2015-04-02 ENCOUNTER — Encounter (HOSPITAL_COMMUNITY): Payer: Self-pay

## 2015-04-03 ENCOUNTER — Encounter (HOSPITAL_COMMUNITY)
Admission: RE | Admit: 2015-04-03 | Discharge: 2015-04-03 | Disposition: A | Discharge status: Home / Self Care | Payer: Self-pay | Source: Ambulatory Visit | Attending: Interventional Cardiology | Admitting: Interventional Cardiology

## 2015-04-04 ENCOUNTER — Encounter (HOSPITAL_COMMUNITY): Payer: Self-pay

## 2015-04-05 ENCOUNTER — Encounter (HOSPITAL_COMMUNITY): Payer: Self-pay

## 2015-04-09 ENCOUNTER — Encounter (HOSPITAL_COMMUNITY)
Admission: RE | Admit: 2015-04-09 | Discharge: 2015-04-09 | Disposition: A | Discharge status: Home / Self Care | Payer: Self-pay | Source: Ambulatory Visit | Attending: Interventional Cardiology | Admitting: Interventional Cardiology

## 2015-04-09 ENCOUNTER — Encounter (HOSPITAL_COMMUNITY): Payer: Self-pay

## 2015-04-10 ENCOUNTER — Encounter (HOSPITAL_COMMUNITY)
Admission: RE | Admit: 2015-04-10 | Discharge: 2015-04-10 | Disposition: A | Discharge status: Home / Self Care | Payer: Self-pay | Source: Ambulatory Visit | Attending: Interventional Cardiology | Admitting: Interventional Cardiology

## 2015-04-11 ENCOUNTER — Encounter (HOSPITAL_COMMUNITY): Payer: Self-pay

## 2015-04-11 ENCOUNTER — Encounter (HOSPITAL_COMMUNITY)
Admission: RE | Admit: 2015-04-11 | Discharge: 2015-04-11 | Disposition: A | Discharge status: Home / Self Care | Payer: Self-pay | Source: Ambulatory Visit | Attending: Interventional Cardiology | Admitting: Interventional Cardiology

## 2015-04-12 ENCOUNTER — Encounter (HOSPITAL_COMMUNITY): Payer: Self-pay

## 2015-04-16 ENCOUNTER — Encounter (HOSPITAL_COMMUNITY)
Admission: RE | Admit: 2015-04-16 | Discharge: 2015-04-16 | Disposition: A | Discharge status: Home / Self Care | Payer: Self-pay | Source: Ambulatory Visit | Attending: Interventional Cardiology | Admitting: Interventional Cardiology

## 2015-04-16 ENCOUNTER — Encounter (HOSPITAL_COMMUNITY): Payer: Self-pay

## 2015-04-17 ENCOUNTER — Encounter (HOSPITAL_COMMUNITY): Payer: Self-pay

## 2015-04-18 ENCOUNTER — Encounter (HOSPITAL_COMMUNITY)
Admission: RE | Admit: 2015-04-18 | Discharge: 2015-04-18 | Disposition: A | Discharge status: Home / Self Care | Payer: Self-pay | Source: Ambulatory Visit | Attending: Interventional Cardiology | Admitting: Interventional Cardiology

## 2015-04-18 ENCOUNTER — Encounter (HOSPITAL_COMMUNITY): Payer: Self-pay

## 2015-04-19 ENCOUNTER — Encounter (HOSPITAL_COMMUNITY): Payer: Self-pay

## 2015-04-23 ENCOUNTER — Encounter (HOSPITAL_COMMUNITY): Payer: Self-pay

## 2015-04-23 DIAGNOSIS — Z48812 Encounter for surgical aftercare following surgery on the circulatory system: Secondary | ICD-10-CM | POA: Insufficient documentation

## 2015-04-23 DIAGNOSIS — Z952 Presence of prosthetic heart valve: Secondary | ICD-10-CM | POA: Insufficient documentation

## 2015-04-23 DIAGNOSIS — I4891 Unspecified atrial fibrillation: Secondary | ICD-10-CM | POA: Insufficient documentation

## 2015-04-24 ENCOUNTER — Encounter (HOSPITAL_COMMUNITY): Payer: Medicare Other

## 2015-04-25 ENCOUNTER — Encounter (HOSPITAL_COMMUNITY): Payer: Self-pay

## 2015-04-26 ENCOUNTER — Encounter (HOSPITAL_COMMUNITY): Payer: Self-pay

## 2015-04-30 ENCOUNTER — Encounter (HOSPITAL_COMMUNITY): Payer: Self-pay

## 2015-05-01 ENCOUNTER — Encounter (HOSPITAL_COMMUNITY): Payer: Self-pay

## 2015-05-02 ENCOUNTER — Encounter (HOSPITAL_COMMUNITY): Payer: Self-pay

## 2015-05-03 ENCOUNTER — Encounter (HOSPITAL_COMMUNITY): Payer: Self-pay

## 2015-05-07 ENCOUNTER — Encounter (HOSPITAL_COMMUNITY): Payer: Self-pay

## 2015-05-08 ENCOUNTER — Encounter (HOSPITAL_COMMUNITY): Payer: Self-pay

## 2015-05-09 ENCOUNTER — Encounter (HOSPITAL_COMMUNITY): Payer: Self-pay

## 2015-05-10 ENCOUNTER — Encounter (HOSPITAL_COMMUNITY): Payer: Self-pay

## 2015-05-14 ENCOUNTER — Other Ambulatory Visit: Payer: Self-pay | Admitting: Interventional Cardiology

## 2015-05-14 ENCOUNTER — Telehealth (HOSPITAL_COMMUNITY): Payer: Self-pay | Admitting: *Deleted

## 2015-05-14 ENCOUNTER — Encounter (HOSPITAL_COMMUNITY): Payer: Self-pay

## 2015-05-14 ENCOUNTER — Encounter (HOSPITAL_COMMUNITY)
Admission: RE | Admit: 2015-05-14 | Payer: Self-pay | Source: Ambulatory Visit | Attending: Interventional Cardiology | Admitting: Interventional Cardiology

## 2015-05-14 NOTE — Telephone Encounter (Signed)
Jettie Booze, MD at 12/13/2014 8:58 AM  furosemide (LASIX) 40 MG tabletTake 1 tablet (40 mg total) by mouth every other day Patient Instructions     Medication Instructions:  Increase Amlodipine to 5 mg daily     Notes Recorded by Jettie Booze, MD on 09/15/2014 at 4:11 PM Kidney function is better. OK to go back to Furosemide 40 mg every other day

## 2015-05-15 ENCOUNTER — Encounter (HOSPITAL_COMMUNITY): Payer: Self-pay

## 2015-05-16 ENCOUNTER — Encounter (HOSPITAL_COMMUNITY): Payer: Self-pay

## 2015-05-17 ENCOUNTER — Encounter (HOSPITAL_COMMUNITY): Payer: Self-pay

## 2015-05-21 ENCOUNTER — Encounter (HOSPITAL_COMMUNITY): Payer: Self-pay

## 2015-05-21 ENCOUNTER — Encounter (HOSPITAL_COMMUNITY)
Admission: RE | Admit: 2015-05-21 | Discharge: 2015-05-21 | Disposition: A | Discharge status: Home / Self Care | Payer: Self-pay | Source: Ambulatory Visit | Attending: Interventional Cardiology | Admitting: Interventional Cardiology

## 2015-05-22 ENCOUNTER — Encounter (HOSPITAL_COMMUNITY)
Admission: RE | Admit: 2015-05-22 | Discharge: 2015-05-22 | Disposition: A | Discharge status: Home / Self Care | Payer: Self-pay | Source: Ambulatory Visit | Attending: Interventional Cardiology | Admitting: Interventional Cardiology

## 2015-05-22 DIAGNOSIS — Z952 Presence of prosthetic heart valve: Secondary | ICD-10-CM | POA: Insufficient documentation

## 2015-05-22 DIAGNOSIS — Z48812 Encounter for surgical aftercare following surgery on the circulatory system: Secondary | ICD-10-CM | POA: Insufficient documentation

## 2015-05-22 DIAGNOSIS — I4891 Unspecified atrial fibrillation: Secondary | ICD-10-CM | POA: Insufficient documentation

## 2015-05-23 ENCOUNTER — Encounter (HOSPITAL_COMMUNITY): Payer: Self-pay

## 2015-05-24 ENCOUNTER — Encounter (HOSPITAL_COMMUNITY): Payer: Self-pay

## 2015-05-28 ENCOUNTER — Encounter (HOSPITAL_COMMUNITY)
Admission: RE | Admit: 2015-05-28 | Discharge: 2015-05-28 | Disposition: A | Discharge status: Home / Self Care | Payer: Self-pay | Source: Ambulatory Visit | Attending: Interventional Cardiology | Admitting: Interventional Cardiology

## 2015-05-28 ENCOUNTER — Encounter (HOSPITAL_COMMUNITY): Payer: Self-pay

## 2015-05-29 ENCOUNTER — Encounter (HOSPITAL_COMMUNITY)
Admission: RE | Admit: 2015-05-29 | Discharge: 2015-05-29 | Disposition: A | Discharge status: Home / Self Care | Payer: Self-pay | Source: Ambulatory Visit | Attending: Interventional Cardiology | Admitting: Interventional Cardiology

## 2015-05-30 ENCOUNTER — Encounter (HOSPITAL_COMMUNITY)
Admission: RE | Admit: 2015-05-30 | Discharge: 2015-05-30 | Disposition: A | Discharge status: Home / Self Care | Payer: Self-pay | Source: Ambulatory Visit | Attending: Interventional Cardiology | Admitting: Interventional Cardiology

## 2015-05-30 ENCOUNTER — Encounter (HOSPITAL_COMMUNITY): Payer: Self-pay

## 2015-05-31 ENCOUNTER — Encounter (HOSPITAL_COMMUNITY): Payer: Self-pay

## 2015-06-04 ENCOUNTER — Encounter (HOSPITAL_COMMUNITY)
Admission: RE | Admit: 2015-06-04 | Discharge: 2015-06-04 | Disposition: A | Discharge status: Home / Self Care | Payer: Self-pay | Source: Ambulatory Visit | Attending: Interventional Cardiology | Admitting: Interventional Cardiology

## 2015-06-04 ENCOUNTER — Encounter (HOSPITAL_COMMUNITY): Payer: Self-pay

## 2015-06-05 ENCOUNTER — Encounter (HOSPITAL_COMMUNITY): Payer: Self-pay

## 2015-06-06 ENCOUNTER — Encounter (HOSPITAL_COMMUNITY)
Admission: RE | Admit: 2015-06-06 | Discharge: 2015-06-06 | Disposition: A | Discharge status: Home / Self Care | Payer: Self-pay | Source: Ambulatory Visit | Attending: Interventional Cardiology | Admitting: Interventional Cardiology

## 2015-06-06 ENCOUNTER — Encounter (HOSPITAL_COMMUNITY): Payer: Self-pay

## 2015-06-07 ENCOUNTER — Encounter (HOSPITAL_COMMUNITY): Payer: Self-pay

## 2015-06-11 ENCOUNTER — Encounter (HOSPITAL_COMMUNITY)
Admission: RE | Admit: 2015-06-11 | Discharge: 2015-06-11 | Disposition: A | Discharge status: Home / Self Care | Payer: Self-pay | Source: Ambulatory Visit | Attending: Interventional Cardiology | Admitting: Interventional Cardiology

## 2015-06-11 ENCOUNTER — Encounter (HOSPITAL_COMMUNITY): Payer: Self-pay

## 2015-06-12 ENCOUNTER — Encounter (HOSPITAL_COMMUNITY): Payer: Self-pay

## 2015-06-13 ENCOUNTER — Encounter (HOSPITAL_COMMUNITY)
Admission: RE | Admit: 2015-06-13 | Discharge: 2015-06-13 | Disposition: A | Discharge status: Home / Self Care | Payer: Self-pay | Source: Ambulatory Visit | Attending: Interventional Cardiology | Admitting: Interventional Cardiology

## 2015-06-13 ENCOUNTER — Encounter (HOSPITAL_COMMUNITY): Payer: Self-pay

## 2015-06-14 ENCOUNTER — Encounter (HOSPITAL_COMMUNITY): Payer: Self-pay

## 2015-06-18 ENCOUNTER — Encounter (HOSPITAL_COMMUNITY): Payer: Self-pay

## 2015-06-18 ENCOUNTER — Encounter (HOSPITAL_COMMUNITY)
Admission: RE | Admit: 2015-06-18 | Discharge: 2015-06-18 | Disposition: A | Discharge status: Home / Self Care | Payer: Self-pay | Source: Ambulatory Visit | Attending: Interventional Cardiology | Admitting: Interventional Cardiology

## 2015-06-19 ENCOUNTER — Encounter (HOSPITAL_COMMUNITY)
Admission: RE | Admit: 2015-06-19 | Discharge: 2015-06-19 | Disposition: A | Discharge status: Home / Self Care | Payer: Self-pay | Source: Ambulatory Visit | Attending: Interventional Cardiology | Admitting: Interventional Cardiology

## 2015-06-19 DIAGNOSIS — Z48812 Encounter for surgical aftercare following surgery on the circulatory system: Secondary | ICD-10-CM | POA: Insufficient documentation

## 2015-06-19 DIAGNOSIS — I4891 Unspecified atrial fibrillation: Secondary | ICD-10-CM | POA: Insufficient documentation

## 2015-06-19 DIAGNOSIS — Z952 Presence of prosthetic heart valve: Secondary | ICD-10-CM | POA: Insufficient documentation

## 2015-06-20 ENCOUNTER — Encounter (HOSPITAL_COMMUNITY)
Admission: RE | Admit: 2015-06-20 | Discharge: 2015-06-20 | Disposition: A | Discharge status: Home / Self Care | Payer: Self-pay | Source: Ambulatory Visit | Attending: Interventional Cardiology | Admitting: Interventional Cardiology

## 2015-06-20 ENCOUNTER — Encounter (HOSPITAL_COMMUNITY): Payer: Self-pay

## 2015-06-21 ENCOUNTER — Encounter (HOSPITAL_COMMUNITY): Payer: Self-pay

## 2015-06-25 ENCOUNTER — Encounter (HOSPITAL_COMMUNITY)
Admission: RE | Admit: 2015-06-25 | Discharge: 2015-06-25 | Disposition: A | Discharge status: Home / Self Care | Payer: Self-pay | Source: Ambulatory Visit | Attending: Interventional Cardiology | Admitting: Interventional Cardiology

## 2015-06-25 ENCOUNTER — Encounter (HOSPITAL_COMMUNITY): Payer: Self-pay

## 2015-06-26 ENCOUNTER — Encounter (HOSPITAL_COMMUNITY): Payer: Self-pay

## 2015-06-27 ENCOUNTER — Encounter (HOSPITAL_COMMUNITY): Payer: Self-pay

## 2015-06-27 ENCOUNTER — Encounter (HOSPITAL_COMMUNITY)
Admission: RE | Admit: 2015-06-27 | Discharge: 2015-06-27 | Disposition: A | Discharge status: Home / Self Care | Payer: Self-pay | Source: Ambulatory Visit | Attending: Interventional Cardiology | Admitting: Interventional Cardiology

## 2015-06-28 ENCOUNTER — Other Ambulatory Visit: Payer: Self-pay | Admitting: *Deleted

## 2015-06-28 ENCOUNTER — Encounter (HOSPITAL_COMMUNITY): Payer: Self-pay

## 2015-06-28 MED ORDER — AMLODIPINE BESYLATE 5 MG PO TABS: 5.0000 mg | ORAL_TABLET | Freq: Every day | ORAL | Status: DC

## 2015-07-01 ENCOUNTER — Ambulatory Visit (INDEPENDENT_AMBULATORY_CARE_PROVIDER_SITE_OTHER): Payer: Medicare Other | Admitting: Interventional Cardiology

## 2015-07-01 ENCOUNTER — Other Ambulatory Visit: Payer: Self-pay | Admitting: *Deleted

## 2015-07-01 ENCOUNTER — Encounter: Payer: Self-pay | Admitting: Interventional Cardiology

## 2015-07-01 ENCOUNTER — Telehealth: Payer: Self-pay | Admitting: Interventional Cardiology

## 2015-07-01 VITALS — BP 140/50 | HR 70 | Ht 69.0 in | Wt 153.8 lb

## 2015-07-01 DIAGNOSIS — I359 Nonrheumatic aortic valve disorder, unspecified: Secondary | ICD-10-CM

## 2015-07-01 DIAGNOSIS — I481 Persistent atrial fibrillation: Secondary | ICD-10-CM | POA: Diagnosis not present

## 2015-07-01 DIAGNOSIS — I4819 Other persistent atrial fibrillation: Secondary | ICD-10-CM

## 2015-07-01 DIAGNOSIS — N183 Chronic kidney disease, stage 3 unspecified: Secondary | ICD-10-CM

## 2015-07-01 DIAGNOSIS — I6523 Occlusion and stenosis of bilateral carotid arteries: Secondary | ICD-10-CM

## 2015-07-01 DIAGNOSIS — I739 Peripheral vascular disease, unspecified: Secondary | ICD-10-CM | POA: Diagnosis not present

## 2015-07-01 DIAGNOSIS — I2510 Atherosclerotic heart disease of native coronary artery without angina pectoris: Secondary | ICD-10-CM | POA: Diagnosis not present

## 2015-07-01 MED ORDER — AMLODIPINE BESYLATE 2.5 MG PO TABS: 2.5000 mg | ORAL_TABLET | Freq: Every day | ORAL | Status: DC

## 2015-07-01 NOTE — Progress Notes (Signed)
Patient ID: [name], male   DOB: [birth year], 82 y.o.   MRN: [id number] Patient ID: [name], male   DOB: 09-21-1930, 82 y.o.   MRN: [id number]     Cardiology Office Note   Date:  07/01/2015   ID:  [name], DOB 02-05-1931, MRN [id number]  PCP:  Ginette Otto, MD    No chief complaint on file. f/u bradycardia   Wt Readings from Last 3 Encounters:  07/01/15 153 lb 12.8 oz (69.763 kg)  12/13/14 169 lb 9.6 oz (76.93 kg)  09/10/14 172 lb 3.2 oz (78.109 kg)       History of Present Illness: [name] is a 82 y.o. male  who had TAVR done in 2013, with a h/o CAD with CABG in 2001 and PCI in 2013. He had severe anemia in 3/15 requiring transfusion. GI workup was negative. He is doing cardiac rehab, 3x/week. He is using an inhaler. Prior MRI showed spinal stenosis. He has had gout as well in his feet, only one minor flare recently.   He is now treated for OSA. No longer having to use Metolazone when he feels fluid overload. He weighs himself and looks for fluid overload corresponding to an increase in weight by 3 lbs.  He has lost weight since the TMJ has acted up.   CAD/ASCVD:  Regularly Exercising at cardiac rehab. No chest pain. Denies : Chest pain.  Reports Dyspnea on exertion. Related to emphysema Improving Leg edema.  No problems with using exercise bike.   He notes some unsteadiness. Uses a cane all of the time.We restarted low-dose Eliquis. He has tolerated it well. No problems even when he gets his TMJ injections.    In the past, he was bradycardic. He denied symptoms but we stopped his metoprolol. He continues to not have any issues with lightheadedness or syncope. His balance is off but he thinks this is related to age. He walks with a cane all of the time.    Past Medical History  Diagnosis Date  . Aortic valve disorder     s/p TAVR at [facility]   . PVD (peripheral vascular disease) (HCC)   . Hypercholesteremia   . HTN (hypertension)   .  Atrial fibrillation (HCC)     Chronic  . Atrial flutter (HCC)   . CKD (chronic kidney disease)   . AAA (abdominal aortic aneurysm) (HCC)   . CAD (coronary artery disease)     Status post CABG, stent to diagonal prior to TAVR  . Bladder cancer (HCC)   . Gallstone   . Vitamin B12 deficiency   . Colon cancer (HCC)   . Carotid stenosis   . Gout     Past Surgical History  Procedure Laterality Date  . Coronary artery bypass graft    . Laminectomy    . Appendectomy    . Colectomy    . Toe surgery    . Hemicolectomy    . Mohs surgery    . Angioplasty    . Esophagogastroduodenoscopy N/A 06/28/2013    Procedure: ESOPHAGOGASTRODUODENOSCOPY (EGD);  Surgeon: Vertell Novak., MD;  Location: [facility] ENDOSCOPY;  Service: Endoscopy;  Laterality: N/A;  . Colonoscopy N/A 06/30/2013    Procedure: COLONOSCOPY;  Surgeon: Vertell Novak., MD;  Location: [facility] ENDOSCOPY;  Service: Endoscopy;  Laterality: N/A;  . Colonoscopy with esophagogastroduodenoscopy (egd) N/A 07/03/2013    Procedure: COLONOSCOPY ;  Surgeon: Barrie Folk, MD;  Location: [facility] ENDOSCOPY;  Service:  Endoscopy;  Laterality: N/A;  Patient is already sedated on the ventilator and may need minimal if any additional sedation.  Please do at bedside.  Will place NG and give some Nulytely as prep.  . Esophagogastroduodenoscopy N/A 07/03/2013    Procedure: ESOPHAGOGASTRODUODENOSCOPY (EGD);  Surgeon: Missy Sabins, MD;  Location: [facility] ENDOSCOPY;  Service: Endoscopy;  Laterality: N/A;  . Givens capsule study N/A 07/03/2013    Procedure: GIVENS CAPSULE STUDY;  Surgeon: Missy Sabins, MD;  Location: [place];  Service: Endoscopy;  Laterality: N/A;     Current Outpatient Prescriptions  Medication Sig Dispense Refill  . allopurinol (ZYLOPRIM) 100 MG tablet Take 100 mg by mouth 2 (two) times daily.     Marland Kitchen amLODipine (NORVASC) 2.5 MG tablet Take 2.5 mg by mouth daily.    Marland Kitchen apixaban (ELIQUIS) 2.5 MG TABS tablet Take 1 tablet (2.5 mg total) by mouth 2  (two) times daily. 180 tablet 3  . atorvastatin (LIPITOR) 40 MG tablet Take 40 mg by mouth daily at 6 PM.     . citric acid-sodium citrate (ORACIT) 334-500 MG/5ML solution Take 15 mLs by mouth every morning. 1 Tablespoon Daily    . Cyanocobalamin (VITAMIN B-12 IJ) Inject 1 each as directed every 30 (thirty) days.    Mariane Baumgarten Sodium (COLACE PO) Take 1 tablet by mouth daily as needed (FOR CONTIPATION).    . famotidine (TH FAMOTIDINE 10) 10 MG tablet Take 10 mg by mouth every morning.    . furosemide (LASIX) 40 MG tablet Take 1 tablet (40 mg total) by mouth every other day. 45 tablet 1  . Tiotropium Bromide Monohydrate (SPIRIVA HANDIHALER IN) Inhale 2 puffs into the lungs daily.     No current facility-administered medications for this visit.    Allergies:   Ramipril    Social History:  The patient  reports that he has quit smoking. He has never used smokeless tobacco. He reports that he drinks alcohol. He reports that he does not use illicit drugs.   Family History:  The patient's family history includes Cancer in his brother and mother; Diabetes in his father; Heart disease in his father.    ROS:  Please see the history of present illness.   Otherwise, review of systems are positive for decreased balance.   All other systems are reviewed and negative.    PHYSICAL EXAM: VS:  BP 140/50 mmHg  Pulse 70  Ht '5\' 9"'$  (1.753 m)  Wt 153 lb 12.8 oz (69.763 kg)  BMI 22.70 kg/m2 , BMI Body mass index is 22.7 kg/(m^2). GEN: Well nourished, well developed, in no acute distress HEENT: normal Neck: no JVD, carotid bruits, or masses Cardiac: Irregularly irregular;  2/6 harsh systolic murmur, no, rubs, or gallops, trace bilateral edema  Respiratory:  clear to auscultation bilaterally, normal work of breathing GI: soft, nontender, nondistended, + BS MS: no deformity or atrophy Skin: warm and dry, no rash Neuro:  Strength and sensation are intact Psych: euthymic mood, full affect   ECG: AFib,  RBBB, rate controlled   Recent Labs: 09/10/2014: Hemoglobin 14.4; Platelets 141.0* 09/13/2014: BUN 49*; Creatinine, Ser 2.09*; Potassium 4.0; Sodium 139   Lipid Panel No results found for: CHOL, TRIG, HDL, CHOLHDL, VLDL, LDLCALC, LDLDIRECT   Other studies Reviewed: Additional studies/ records that were reviewed today with results demonstrating: Normal LV function in 2014.   ASSESSMENT AND PLAN:  1. A. Fib: Rate controlled.  No sx of palpitations. He had had bradycardia as well. This has  resolved since stopping the metoprolol. No symptoms of bradycardia. Eliquis approved for stroke prevention. He had GI bleeding on Coumadin as well in the past. I met him in 2007 and he had a cardioversion at that time. He had his aortic valve replacement many years later.  Tolerating Eliquis well.  No falls. 2. Hypertension: Blood controlled today. He says his blood pressure is controlled when checked at rehabilitation. Increased amlodipine to 5 mg daily.  Now back on 2.5 daily.  With weight loss, BP has come down. 3. No ACE due to renal insufficiency. 4. PAD: Follow carotid Doppler.  LE arterial disease is not limiting his walking at this time.  Will need repeat carotid Doppler as it has been since 2015 since he had one.   Current medicines are reviewed at length with the patient today.  The patient concerns regarding his medicines were addressed.  The following changes have been made: As above  Labs/ tests ordered today include: none No orders of the defined types were placed in this encounter.    Recommend 150 minutes/week of aerobic exercise Low fat, low carb, high fiber diet recommended  Disposition:   FU in 6 months   Teresita Madura., MD  07/01/2015 8:14 AM    [facility] [place], [place], [place]  [address] Phone: [phone]; Fax: [phone]

## 2015-07-01 NOTE — Patient Instructions (Addendum)
**Note De-Identified [name] Obfuscation** Medication Instructions:  Same-no changes  Labwork: None  Testing/Procedures: Pt was contacted by telephone after OV and advised, per Dr Irish Lack, that he is due for a Carotid Duplex. The pt is in agreement and is aware that someone from scheduling will be contacting him to arrange date and time of test.  Follow-Up: Your physician wants you to follow-up in: 9 months. You will receive a reminder letter in the mail two months in advance. If you don't receive a letter, please call our office to schedule the follow-up appointment.  If you need a refill on your cardiac medications before your next appointment, please call your pharmacy.

## 2015-07-01 NOTE — Telephone Encounter (Signed)
°*  STAT* If patient is at the pharmacy, call can be transferred to refill team.   1. Which medications need to be refilled? (please list name of each medication and dose if known) Amlodipine-need this called in today-please call pt when this is called in  2. Which pharmacy/location (including street and city if local pharmacy) is medication to be sent to?[facility] [phone] 3. Do they need a 30 day or 90 day supply? 90 and refills

## 2015-07-01 NOTE — Addendum Note (Signed)
Addended by: Dennie Fetters on: 07/01/2015 09:13 AM   Modules accepted: Orders

## 2015-07-02 ENCOUNTER — Encounter (HOSPITAL_COMMUNITY)
Admission: RE | Admit: 2015-07-02 | Discharge: 2015-07-02 | Disposition: A | Discharge status: Home / Self Care | Payer: Self-pay | Source: Ambulatory Visit | Attending: Interventional Cardiology | Admitting: Interventional Cardiology

## 2015-07-02 ENCOUNTER — Encounter (HOSPITAL_COMMUNITY): Payer: Self-pay

## 2015-07-03 ENCOUNTER — Encounter (HOSPITAL_COMMUNITY): Payer: Self-pay

## 2015-07-04 ENCOUNTER — Ambulatory Visit (HOSPITAL_COMMUNITY): Admission: RE | Admit: 2015-07-04 | Payer: Medicare Other | Source: Ambulatory Visit

## 2015-07-04 ENCOUNTER — Encounter (HOSPITAL_COMMUNITY): Payer: Self-pay

## 2015-07-04 ENCOUNTER — Encounter (HOSPITAL_COMMUNITY)
Admission: RE | Admit: 2015-07-04 | Discharge: 2015-07-04 | Disposition: A | Discharge status: Home / Self Care | Payer: Self-pay | Source: Ambulatory Visit | Attending: Interventional Cardiology | Admitting: Interventional Cardiology

## 2015-07-05 ENCOUNTER — Encounter (HOSPITAL_COMMUNITY): Payer: Self-pay

## 2015-07-09 ENCOUNTER — Encounter (HOSPITAL_COMMUNITY)
Admission: RE | Admit: 2015-07-09 | Discharge: 2015-07-09 | Disposition: A | Discharge status: Home / Self Care | Payer: Self-pay | Source: Ambulatory Visit | Attending: Interventional Cardiology | Admitting: Interventional Cardiology

## 2015-07-09 ENCOUNTER — Encounter (HOSPITAL_COMMUNITY): Payer: Self-pay

## 2015-07-10 ENCOUNTER — Encounter (HOSPITAL_COMMUNITY): Payer: Self-pay

## 2015-07-10 ENCOUNTER — Ambulatory Visit (HOSPITAL_COMMUNITY)
Admission: RE | Admit: 2015-07-10 | Discharge: 2015-07-10 | Disposition: A | Discharge status: Home / Self Care | Payer: Medicare Other | Source: Ambulatory Visit | Attending: Cardiovascular Disease | Admitting: Cardiovascular Disease

## 2015-07-10 DIAGNOSIS — I6523 Occlusion and stenosis of bilateral carotid arteries: Secondary | ICD-10-CM

## 2015-07-11 ENCOUNTER — Encounter (HOSPITAL_COMMUNITY)
Admission: RE | Admit: 2015-07-11 | Discharge: 2015-07-11 | Disposition: A | Discharge status: Home / Self Care | Payer: Self-pay | Source: Ambulatory Visit | Attending: Interventional Cardiology | Admitting: Interventional Cardiology

## 2015-07-11 ENCOUNTER — Encounter (HOSPITAL_COMMUNITY): Payer: Self-pay

## 2015-07-12 ENCOUNTER — Encounter (HOSPITAL_COMMUNITY): Payer: Self-pay

## 2015-07-16 ENCOUNTER — Encounter (HOSPITAL_COMMUNITY): Payer: Self-pay

## 2015-07-16 ENCOUNTER — Encounter (HOSPITAL_COMMUNITY)
Admission: RE | Admit: 2015-07-16 | Payer: Self-pay | Source: Ambulatory Visit | Attending: Interventional Cardiology | Admitting: Interventional Cardiology

## 2015-07-17 ENCOUNTER — Encounter (HOSPITAL_COMMUNITY): Payer: Self-pay

## 2015-07-18 ENCOUNTER — Encounter (HOSPITAL_COMMUNITY): Payer: Self-pay

## 2015-07-18 ENCOUNTER — Encounter (HOSPITAL_COMMUNITY)
Admission: RE | Admit: 2015-07-18 | Discharge: 2015-07-18 | Disposition: A | Discharge status: Home / Self Care | Payer: Self-pay | Source: Ambulatory Visit | Attending: Interventional Cardiology | Admitting: Interventional Cardiology

## 2015-07-19 ENCOUNTER — Encounter (HOSPITAL_COMMUNITY): Payer: Self-pay

## 2015-07-23 ENCOUNTER — Encounter (HOSPITAL_COMMUNITY)
Admission: RE | Admit: 2015-07-23 | Discharge: 2015-07-23 | Disposition: A | Discharge status: Home / Self Care | Payer: Self-pay | Source: Ambulatory Visit | Attending: Interventional Cardiology | Admitting: Interventional Cardiology

## 2015-07-23 ENCOUNTER — Encounter (HOSPITAL_COMMUNITY): Payer: Self-pay

## 2015-07-23 DIAGNOSIS — Z952 Presence of prosthetic heart valve: Secondary | ICD-10-CM | POA: Insufficient documentation

## 2015-07-23 DIAGNOSIS — I4891 Unspecified atrial fibrillation: Secondary | ICD-10-CM | POA: Insufficient documentation

## 2015-07-23 DIAGNOSIS — Z48812 Encounter for surgical aftercare following surgery on the circulatory system: Secondary | ICD-10-CM | POA: Insufficient documentation

## 2015-07-24 ENCOUNTER — Encounter (HOSPITAL_COMMUNITY): Payer: Self-pay

## 2015-07-25 ENCOUNTER — Encounter (HOSPITAL_COMMUNITY): Payer: Self-pay

## 2015-07-26 ENCOUNTER — Encounter (HOSPITAL_COMMUNITY): Payer: Self-pay

## 2015-07-30 ENCOUNTER — Encounter (HOSPITAL_COMMUNITY): Payer: Self-pay

## 2015-07-30 ENCOUNTER — Encounter (HOSPITAL_COMMUNITY)
Admission: RE | Admit: 2015-07-30 | Discharge: 2015-07-30 | Disposition: A | Discharge status: Home / Self Care | Payer: Self-pay | Source: Ambulatory Visit | Attending: Interventional Cardiology | Admitting: Interventional Cardiology

## 2015-07-31 ENCOUNTER — Encounter (HOSPITAL_COMMUNITY): Payer: Self-pay

## 2015-08-01 ENCOUNTER — Encounter (HOSPITAL_COMMUNITY): Payer: Self-pay

## 2015-08-02 ENCOUNTER — Encounter: Payer: Self-pay | Admitting: Diagnostic Neuroimaging

## 2015-08-02 ENCOUNTER — Ambulatory Visit (INDEPENDENT_AMBULATORY_CARE_PROVIDER_SITE_OTHER): Payer: Medicare Other | Admitting: Diagnostic Neuroimaging

## 2015-08-02 ENCOUNTER — Encounter (HOSPITAL_COMMUNITY): Payer: Self-pay

## 2015-08-02 VITALS — BP 146/73 | HR 66 | Ht 69.0 in | Wt 151.6 lb

## 2015-08-02 DIAGNOSIS — G500 Trigeminal neuralgia: Secondary | ICD-10-CM | POA: Diagnosis not present

## 2015-08-02 DIAGNOSIS — M26609 Unspecified temporomandibular joint disorder, unspecified side: Secondary | ICD-10-CM | POA: Diagnosis not present

## 2015-08-02 DIAGNOSIS — G501 Atypical facial pain: Secondary | ICD-10-CM

## 2015-08-02 DIAGNOSIS — I6523 Occlusion and stenosis of bilateral carotid arteries: Secondary | ICD-10-CM

## 2015-08-02 MED ORDER — GABAPENTIN 100 MG PO CAPS: 100.0000 mg | ORAL_CAPSULE | Freq: Three times a day (TID) | ORAL | Status: DC

## 2015-08-02 NOTE — Patient Instructions (Signed)
Thank you for coming to see [place] at [facility]. I hope we have been able to provide you high quality care today.  You may receive a patient satisfaction survey over the next few weeks. We would appreciate your feedback and comments so that we may continue to improve ourselves and the health of our patients.  - start gabapentin 143m at bedtmie; after 1-2 weeks gradually increase to twice a day and then three times per day - I will check MRI brain scan   ~~~~~~~~~~~~~~~~~~~~~~~~~~~~~~~~~~~~~~~~~~~~~~~~~~~~~~~~~~~~~~~~~  DR. Hortense Cantrall'S GUIDE TO HAPPY AND HEALTHY LIVING These are some of my general health and wellness recommendations. Some of them may apply to you better than others. Please use common sense as you try these suggestions and feel free to ask me any questions.   ACTIVITY/FITNESS Mental, social, emotional and physical stimulation are very important for brain and body health. Try learning a new activity (arts, music, language, sports, games).  Keep moving your body to the best of your abilities. You can do this at home, inside or outside, the park, community center, gym or anywhere you like. Consider a physical therapist or personal trainer to get started. Consider the app Sworkit. Fitness trackers such as smart-watches, smart-phones or Fitbits can help as well.   NUTRITION Eat more plants: colorful vegetables, nuts, seeds and berries.  Eat less sugar, salt, preservatives and processed foods.  Avoid toxins such as cigarettes and alcohol.  Drink water when you are thirsty. Warm water with a slice of lemon is an excellent morning drink to start the day.  Consider these websites for more information The Nutrition Source (hhttps://www.henry-hernandez.biz/ [employer] (wWindowBlog.ch   RELAXATION Consider practicing mindfulness meditation or other relaxation techniques such as deep breathing, prayer, yoga,  tai chi, massage. See website mindful.org or the apps Headspace or Calm to help get started.   SLEEP Try to get at least 7-8+ hours sleep per day. Regular exercise and reduced caffeine will help you sleep better. Practice good sleep hygeine techniques. See website sleep.org for more information.   PLANNING Prepare estate planning, living will, healthcare POA documents. Sometimes this is best planned with the help of an attorney. Theconversationproject.org and agingwithdignity.org are excellent resources.

## 2015-08-02 NOTE — Progress Notes (Signed)
[facility]  PATIENT: [name] DOB: 01-Aug-1930  REFERRING CLINICIAN: Victorio Palm, DDS HISTORY FROM: patient and wife  REASON FOR VISIT: new consult    HISTORICAL  CHIEF COMPLAINT:  Chief Complaint  Patient presents with  . Possible trigeminal neuralgia    rm 6, New Pt, wife -Fraser Din, "hx of TMJ; pain in L face/head/ear since 11/2014; comes and goes in intensity; Tylenol prn"    HISTORY OF PRESENT ILLNESS:   82 year old right-handed male here for evaluation of left facial pain. Patient has history of hypertension, hypercholesteremia, heart disease, atrial fibrillation and bladder cancer.  In the 1960s patient had onset of sharp stabbing shocks of severe electrical pain in the left side of his face. He was diagnosed with left-sided trigeminal neuralgia. He underwent some type of surgery in [place] without benefit in symptoms. He was then referred through a friend to a neurosurgeon at [facility] who performed a different type of surgery. Following this his symptoms significantly improved. Since that time he had done quite well over many decades.  August 2016 patient had onset of left-sided in her ear pain. Symptoms were gradual onset and progressive. At one point he yawned and felt a "pop sensation" in the left ear. He went to PCP and ENT for evaluation. No specific cause of symptoms were found. Patient was then referred to Dr. Girtha Rm for evaluation of possible TMJ dysfunction. 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 He was treated with nonsurgical treatments to lyse possible soft tissue adhesions. Following treatments some pain in the preauricular region was reduced.  However patient was also having additional pain, dull sensation in the left forehead, left scalp, left ear and left face. He rates this as 3 out of 10 in severity. Other times symptoms suddenly increase up to 7  or 8 out of 10. Symptoms can last 60 seconds up to 1 hour at a time. No specific triggering or alleviating factors. Sometimes pain is triggered by chewing or talking but not consistently. Patient has started to avoid chewing on the left side as he worries that may trigger pain.  No other new problems in his vision, arms, legs, torso. No significant headaches, nausea or vomiting. No photophobia or phonophobia.    REVIEW OF SYSTEMS: Full 14 system review of systems performed and negative with exception of: Weight loss fatigue hearing loss itching urination problem impotence blood in urine impotence incontinence or shortness of breath feeling cold pain in jaw and ear not asleep decreased energy.  ALLERGIES: Allergies  Allergen Reactions  . Sulfa Antibiotics Shortness Of Breath  . Ramipril Other (See Comments)    unknown     HOME MEDICATIONS: Outpatient Prescriptions Prior to Visit  Medication Sig Dispense Refill  . allopurinol (ZYLOPRIM) 100 MG tablet Take 100 mg by mouth 2 (two) times daily.     Marland Kitchen amLODipine (NORVASC) 2.5 MG tablet Take 1 tablet (2.5 mg total) by mouth daily. 90 tablet 1  . apixaban (ELIQUIS) 2.5 MG TABS tablet Take 1 tablet (2.5 mg total) by mouth 2 (two) times daily. 180 tablet 3  . atorvastatin (LIPITOR) 40 MG tablet Take 40 mg by mouth daily at 6 PM.     . citric acid-sodium citrate (ORACIT) 334-500 MG/5ML solution Take 15 mLs by mouth every morning. 1 Tablespoon Daily    . Cyanocobalamin (VITAMIN B-12 IJ) Inject 1 each as directed every 30 (thirty) days.    Marland Kitchen  Docusate Sodium (COLACE PO) Take 1 tablet by mouth daily as needed (FOR CONTIPATION).    . famotidine (TH FAMOTIDINE 10) 10 MG tablet Take 10 mg by mouth every morning.    . furosemide (LASIX) 40 MG tablet Take 1 tablet (40 mg total) by mouth every other day. 45 tablet 1  . Tiotropium Bromide Monohydrate (SPIRIVA HANDIHALER IN) Inhale 2 puffs into the lungs daily.     No facility-administered medications prior  to visit.    PAST MEDICAL HISTORY: Past Medical History  Diagnosis Date  . Aortic valve disorder     s/p TAVR at [facility]   . PVD (peripheral vascular disease) ([place])   . Hypercholesteremia   . HTN (hypertension)   . Atrial fibrillation (HCC)     Chronic  . Atrial flutter ([place])   . CKD (chronic kidney disease)   . AAA (abdominal aortic aneurysm) ([place])   . CAD (coronary artery disease)     Status post CABG, stent to diagonal prior to TAVR  . Bladder cancer ([place])   . Gallstone   . Vitamin B12 deficiency   . Colon cancer ([place])   . Carotid stenosis   . Gout   . Cancer ([place])     basal cell    PAST SURGICAL HISTORY: Past Surgical History  Procedure Laterality Date  . Coronary artery bypass graft  02/2000  . Laminectomy    . Appendectomy    . Colectomy  07/2002    colon cancer  . Toe surgery    . Hemicolectomy    . Mohs surgery  09/2005    nose  . Angioplasty  04/2002  . Esophagogastroduodenoscopy N/A 06/28/2013    Procedure: ESOPHAGOGASTRODUODENOSCOPY (EGD);  Surgeon: Winfield Cunas., MD;  Location: [facility] ENDOSCOPY;  Service: Endoscopy;  Laterality: N/A;  . Colonoscopy N/A 06/30/2013    Procedure: COLONOSCOPY;  Surgeon: Winfield Cunas., MD;  Location: [facility] ENDOSCOPY;  Service: Endoscopy;  Laterality: N/A;  . Colonoscopy with esophagogastroduodenoscopy (egd) N/A 07/03/2013    Procedure: COLONOSCOPY ;  Surgeon: Missy Sabins, MD;  Location: [facility];  Service: Endoscopy;  Laterality: N/A;  Patient is already sedated on the ventilator and may need minimal if any additional sedation.   . Esophagogastroduodenoscopy N/A 07/03/2013    Procedure: ESOPHAGOGASTRODUODENOSCOPY (EGD);  Surgeon: Missy Sabins, MD;  Location: [facility] ENDOSCOPY;  Service: Endoscopy;  Laterality: N/A;  . Givens capsule study N/A 07/03/2013    Procedure: GIVENS CAPSULE STUDY;  Surgeon: Missy Sabins, MD;  Location: [facility];  Service: Endoscopy;  Laterality: N/A;  . Bladder surgery  02/2001    neo bladder replaced  cancerous bladder, prostate removed  . Cataract surgery Bilateral 07/25/09, 08/15/09  . Aortic valve replacement  08/2010    FAMILY HISTORY: Family History  Problem Relation Age of Onset  . Cancer Mother     Colon cancer  . Heart disease Father   . Diabetes Father   . Cancer Father   . Cancer Brother     esophageal    SOCIAL HISTORY:  Social History   Social History  . Marital Status: Married    Spouse Name: Mardene Celeste  . Number of Children: 3  . Years of Education: college 16   Occupational History  . RETIRED     [employer]   Social History Main Topics  . Smoking status: Former Smoker -- 3.00 packs/day for 32 years    Quit date: 08/02/1982  . Smokeless tobacco: Never Used  .  Alcohol Use: Yes     Comment: occasional  . Drug Use: No  . Sexual Activity: Not on file   Other Topics Concern  . Not on file   Social History Narrative   lives at home with wife   Caffeine use- 3 a day     PHYSICAL EXAM  GENERAL EXAM/CONSTITUTIONAL: Vitals:  Filed Vitals:   08/02/15 0840  BP: 146/73  Pulse: 66  Height: 5\' 9"  (1.753 m)  Weight: 151 lb 9.6 oz (68.765 kg)     Body mass index is 22.38 kg/(m^2).  Visual Acuity Screening   Right eye Left eye Both eyes  Without correction: 20/40 20/70   With correction:        Patient is in no distress; well developed, nourished and groomed; neck is supple  MOUTH OPEN AND CLOSE --> LEFT TMJ DOES NOT FULLY ARTICULATE; JAW DEVIATES TO THE LEFT SIDE WITH MOUTH OPENING  CARDIOVASCULAR:  Examination of carotid arteries is normal; no carotid bruits  IRREGULARLY IRREGULAR; SYSTOLIC MURMUR THROUGHOUT  Examination of peripheral vascular system by observation and palpation is normal  EYES:  Ophthalmoscopic exam of optic discs and posterior segments is normal; no papilledema or hemorrhages  MUSCULOSKELETAL:  Gait, strength, tone, movements noted in Neurologic exam below  NEUROLOGIC: MENTAL STATUS:  No flowsheet data  found.  awake, alert, oriented to person, place and time  recent and remote memory intact  normal attention and concentration  language fluent, comprehension intact, naming intact,   fund of knowledge appropriate  CRANIAL NERVE:   2nd - no papilledema on fundoscopic exam  2nd, 3rd, 4th, 6th - pupils equal and reactive to light, visual fields full to confrontation, extraocular muscles intact, no nystagmus  5th - facial sensation; SLIGHTLY DECREASED LEFT V2, V3 PINPRICK  7th - facial strength symmetric  8th - hearing intact  9th - palate elevates symmetrically, uvula midline  11th - shoulder shrug symmetric  12th - tongue protrusion --> DEVIATES TO THE LEFT SIDE  SLIGHTLY HOARSE VOICE  MOTOR:   normal bulk and tone, full strength in the BUE, BLE  SENSORY:   normal and symmetric to light touch, pinprick, temperature, vibration; EXCEPT ABSENT VIB AT TOES AND DECR VIB AT ANKLES  COORDINATION:   finger-nose-finger, fine finger movements normal  REFLEXES:   deep tendon reflexes present and symmetric; ABSENT AT ANKLES  GAIT/STATION:   narrow based gait; STOOPED POSTURE; DECR ARM SWING    DIAGNOSTIC DATA (LABS, IMAGING, TESTING) - I reviewed patient records, labs, notes, testing and imaging myself where available.  Lab Results  Component Value Date   WBC 8.0 09/10/2014   HGB 14.4 09/10/2014   HCT 42.6 09/10/2014   MCV 97.2 09/10/2014   PLT 141.0* 09/10/2014      Component Value Date/Time   NA 139 09/13/2014 0755   K 4.0 09/13/2014 0755   CL 108 09/13/2014 0755   CO2 22 09/13/2014 0755   GLUCOSE 103* 09/13/2014 0755   BUN 49* 09/13/2014 0755   CREATININE 2.09* 09/13/2014 0755   CALCIUM 9.3 09/13/2014 0755   PROT 3.3* 07/04/2013 0408   ALBUMIN 1.4* 07/04/2013 0408   AST 15 07/04/2013 0408   ALT 8 07/04/2013 0408   ALKPHOS 60 07/04/2013 0408   BILITOT 0.4 07/04/2013 0408   GFRNONAA 26* 07/08/2013 0352   GFRAA 30* 07/08/2013 0352   No results  found for: CHOL, HDL, LDLCALC, LDLDIRECT, TRIG, CHOLHDL No results found for: HGBA1C No results found for: VITAMINB12 Lab Results  Component Value Date   TSH 1.482 06/27/2013    05/17/15 MRI TMJ  - Left temporomandibular joint appears to be locked in the closed position with no motion detected on either the open or closed mouth views. There is no meniscal dislocation or significant degenerative change.  - Normal right temporomandibular joint.    ASSESSMENT AND PLAN  82 y.o. year old male here with remote history of left-sided trigeminal neuralgia, status post surgery 2 in the 1960s, with good resolution of symptoms. However now patient having recurrence of different type of symptoms on the left side since August 2016. Considerations would include recurrence of trigeminal neuralgia, new atypical facial pain syndrome, TMJ referred pain syndrome, or other cause. We'll check MRI and give patient empiric trial of gabapentin.   Ddx: recurrent trigeminal neuralgia, atypical facial pain syndrome, TMJ referred pain syndrome, other secondary cause (CNS vascular, inflammation, structural)  1. Atypical facial pain   2. Trigeminal neuralgia of left side of face   3. TMJ (temporomandibular joint disorder)      PLAN: - check MRI brain and CN5 (with and without)  - trial of gabapentin (will avoid carbamazepine due to patient age and possible reaction with apixaban)  Orders Placed This Encounter  Procedures  . MR Brain W Wo Contrast   Meds ordered this encounter  Medications  . gabapentin (NEURONTIN) 100 MG capsule    Sig: Take 1 capsule (100 mg total) by mouth 3 (three) times daily.    Dispense:  90 capsule    Refill:  6   Return in about 2 months (around 10/02/2015).  I reviewed images, labs, notes, records myself. I summarized findings and reviewed with patient, for this high risk condition (severe facial pain, possible stroke/mass, chronic anti-coagulation) requiring high complexity  decision making.    Penni Bombard, MD [id number], [id number] AM Certified in Neurology, Neurophysiology and Neuroimaging  [facility] [address] [place], [place] [address] [phone]

## 2015-08-06 ENCOUNTER — Encounter (HOSPITAL_COMMUNITY)
Admission: RE | Admit: 2015-08-06 | Discharge: 2015-08-06 | Disposition: A | Discharge status: Home / Self Care | Payer: Self-pay | Source: Ambulatory Visit | Attending: Interventional Cardiology | Admitting: Interventional Cardiology

## 2015-08-06 ENCOUNTER — Encounter (HOSPITAL_COMMUNITY): Payer: Self-pay

## 2015-08-07 ENCOUNTER — Encounter (HOSPITAL_COMMUNITY): Payer: Self-pay

## 2015-08-08 ENCOUNTER — Encounter (HOSPITAL_COMMUNITY): Payer: Self-pay

## 2015-08-08 ENCOUNTER — Encounter (HOSPITAL_COMMUNITY)
Admission: RE | Admit: 2015-08-08 | Discharge: 2015-08-08 | Disposition: A | Discharge status: Home / Self Care | Payer: Self-pay | Source: Ambulatory Visit | Attending: Interventional Cardiology | Admitting: Interventional Cardiology

## 2015-08-09 ENCOUNTER — Telehealth: Payer: Self-pay | Admitting: Diagnostic Neuroimaging

## 2015-08-09 ENCOUNTER — Encounter (HOSPITAL_COMMUNITY): Payer: Self-pay

## 2015-08-09 DIAGNOSIS — G500 Trigeminal neuralgia: Secondary | ICD-10-CM

## 2015-08-09 NOTE — Telephone Encounter (Signed)
-----   Message from Blenda Peals sent at 08/09/2015 10:19 AM EDT ----- Regarding: MRI ORBITS W/WO  Olin Hauser from [facility] called in regards to patient's MRI BRAIN W/WO for trigeminal neuralgia, she states that their protocol for trigeminal neuralgia a MRI ORBITS W/WO is ordered along with the MRI BRAIN W/WO.  Can you please order MRI ORBITS W/WO for the patient? Patient is scheduled on 4/24 @ [facility].  Please advise once the order is enetered.  Thank you!

## 2015-08-12 ENCOUNTER — Ambulatory Visit
Admission: RE | Admit: 2015-08-12 | Discharge: 2015-08-12 | Disposition: A | Discharge status: Home / Self Care | Payer: Medicare Other | Source: Ambulatory Visit | Attending: Diagnostic Neuroimaging | Admitting: Diagnostic Neuroimaging

## 2015-08-12 DIAGNOSIS — G501 Atypical facial pain: Secondary | ICD-10-CM | POA: Diagnosis not present

## 2015-08-12 DIAGNOSIS — G500 Trigeminal neuralgia: Secondary | ICD-10-CM

## 2015-08-12 DIAGNOSIS — M26609 Unspecified temporomandibular joint disorder, unspecified side: Secondary | ICD-10-CM

## 2015-08-12 MED ORDER — GADOBENATE DIMEGLUMINE 529 MG/ML IV SOLN
7.0000 mL | Freq: Once | INTRAVENOUS | Status: AC | PRN
  Administered 2015-08-12: 7 mL via INTRAVENOUS

## 2015-08-13 ENCOUNTER — Encounter (HOSPITAL_COMMUNITY)
Admission: RE | Admit: 2015-08-13 | Discharge: 2015-08-13 | Disposition: A | Discharge status: Home / Self Care | Payer: Self-pay | Source: Ambulatory Visit | Attending: Interventional Cardiology | Admitting: Interventional Cardiology

## 2015-08-13 ENCOUNTER — Encounter (HOSPITAL_COMMUNITY): Payer: Self-pay

## 2015-08-14 ENCOUNTER — Encounter (HOSPITAL_COMMUNITY): Payer: Self-pay

## 2015-08-15 ENCOUNTER — Encounter (HOSPITAL_COMMUNITY): Payer: Self-pay

## 2015-08-16 ENCOUNTER — Encounter (HOSPITAL_COMMUNITY): Payer: Self-pay

## 2015-08-20 ENCOUNTER — Encounter (HOSPITAL_COMMUNITY): Payer: Self-pay

## 2015-08-20 ENCOUNTER — Encounter (HOSPITAL_COMMUNITY)
Admission: RE | Admit: 2015-08-20 | Discharge: 2015-08-20 | Disposition: A | Discharge status: Home / Self Care | Payer: Self-pay | Source: Ambulatory Visit | Attending: Interventional Cardiology | Admitting: Interventional Cardiology

## 2015-08-20 DIAGNOSIS — Z48812 Encounter for surgical aftercare following surgery on the circulatory system: Secondary | ICD-10-CM | POA: Insufficient documentation

## 2015-08-20 DIAGNOSIS — Z952 Presence of prosthetic heart valve: Secondary | ICD-10-CM | POA: Insufficient documentation

## 2015-08-20 DIAGNOSIS — I4891 Unspecified atrial fibrillation: Secondary | ICD-10-CM | POA: Insufficient documentation

## 2015-08-20 NOTE — Telephone Encounter (Signed)
Pt's wife called requesting MRI results

## 2015-08-20 NOTE — Telephone Encounter (Signed)
No major new findings. Continue current plan. -VRP

## 2015-08-20 NOTE — Telephone Encounter (Signed)
Spoke with wife and relayed Dr Gladstone Lighter reply re: her husband's MRIs. She stated "he was hoping something would be found that is causing his pain".  She stated he is taking gabapentin 100 mg twice a day, and is getting "some relief". She stated he "is afraid to take it three times a day due to possible drowsiness". Advised he may take one tab in morning and two tabs at bedtime to see if he gets more pain relief.  She stated she would let him know. Confirmed his FU in July and advised they call for any needs beforehand. She verbalized understanding, appreciation.

## 2015-08-21 ENCOUNTER — Encounter (HOSPITAL_COMMUNITY)
Admission: RE | Admit: 2015-08-21 | Discharge: 2015-08-21 | Disposition: A | Discharge status: Home / Self Care | Payer: Self-pay | Source: Ambulatory Visit | Attending: Interventional Cardiology | Admitting: Interventional Cardiology

## 2015-08-21 ENCOUNTER — Other Ambulatory Visit (HOSPITAL_COMMUNITY): Payer: Self-pay | Admitting: *Deleted

## 2015-08-22 ENCOUNTER — Encounter (HOSPITAL_COMMUNITY)
Admission: RE | Admit: 2015-08-22 | Discharge: 2015-08-22 | Disposition: A | Discharge status: Home / Self Care | Payer: Self-pay | Source: Ambulatory Visit | Attending: Interventional Cardiology | Admitting: Interventional Cardiology

## 2015-08-22 ENCOUNTER — Encounter (HOSPITAL_COMMUNITY): Payer: Self-pay

## 2015-08-23 ENCOUNTER — Encounter (HOSPITAL_COMMUNITY): Payer: Self-pay

## 2015-08-27 ENCOUNTER — Encounter (HOSPITAL_COMMUNITY)
Admission: RE | Admit: 2015-08-27 | Discharge: 2015-08-27 | Disposition: A | Discharge status: Home / Self Care | Payer: Self-pay | Source: Ambulatory Visit | Attending: Interventional Cardiology | Admitting: Interventional Cardiology

## 2015-08-27 ENCOUNTER — Encounter (HOSPITAL_COMMUNITY): Payer: Self-pay

## 2015-08-28 ENCOUNTER — Encounter (HOSPITAL_COMMUNITY)
Admission: RE | Admit: 2015-08-28 | Discharge: 2015-08-28 | Disposition: A | Discharge status: Home / Self Care | Payer: Self-pay | Source: Ambulatory Visit | Attending: Interventional Cardiology | Admitting: Interventional Cardiology

## 2015-08-28 ENCOUNTER — Encounter: Payer: Self-pay | Admitting: Interventional Cardiology

## 2015-08-29 ENCOUNTER — Encounter (HOSPITAL_COMMUNITY): Payer: Self-pay

## 2015-08-30 ENCOUNTER — Encounter (HOSPITAL_COMMUNITY): Payer: Self-pay

## 2015-09-03 ENCOUNTER — Encounter (HOSPITAL_COMMUNITY): Payer: Self-pay

## 2015-09-03 ENCOUNTER — Encounter (HOSPITAL_COMMUNITY)
Admission: RE | Admit: 2015-09-03 | Discharge: 2015-09-03 | Disposition: A | Discharge status: Home / Self Care | Payer: Self-pay | Source: Ambulatory Visit | Attending: Interventional Cardiology | Admitting: Interventional Cardiology

## 2015-09-04 ENCOUNTER — Encounter (HOSPITAL_COMMUNITY): Payer: Self-pay

## 2015-09-05 ENCOUNTER — Encounter (HOSPITAL_COMMUNITY): Payer: Self-pay

## 2015-09-05 ENCOUNTER — Encounter (HOSPITAL_COMMUNITY)
Admission: RE | Admit: 2015-09-05 | Discharge: 2015-09-05 | Disposition: A | Discharge status: Home / Self Care | Payer: Self-pay | Source: Ambulatory Visit | Attending: Interventional Cardiology | Admitting: Interventional Cardiology

## 2015-09-06 ENCOUNTER — Telehealth: Payer: Self-pay | Admitting: Diagnostic Neuroimaging

## 2015-09-06 ENCOUNTER — Encounter (HOSPITAL_COMMUNITY): Payer: Self-pay

## 2015-09-06 NOTE — Telephone Encounter (Signed)
Agree. -VRP 

## 2015-09-06 NOTE — Telephone Encounter (Signed)
Patient is calling. He states gabapentin (NEURONTIN) 100 MG capsule is not helping with pain. Is there something else he can take? The [facility] uses [facility] on [address] Please call and discuss. If no answer please leave a message or call on Monday.

## 2015-09-06 NOTE — Telephone Encounter (Signed)
LVM for patient informing him Dr Leta Baptist advised he may increase gabapentin to 200 mg three times a day. He may also move his follow up appointment to a sooner date. Advised if he has questions and/or would like to move his appointment he should call Monday after 8 am when the office reopens. Left name, number.

## 2015-09-09 ENCOUNTER — Telehealth: Payer: Self-pay | Admitting: *Deleted

## 2015-09-09 ENCOUNTER — Encounter: Payer: Self-pay | Admitting: *Deleted

## 2015-09-09 NOTE — Telephone Encounter (Signed)
Wife came into office due to being unable to reach office because office phone system currently out of order. She stated her husband woke up this morning with the right side of his mouth drooping and his right eyelid drooping. She denied that he had speech difficulty, weakness or numbness of any extremities, difficulty walking or other stroke symptoms. When asked "how is he now?", she stated "he still looks the same." She stated his mother had history of Bell's Palsey, and she wondered if that 's what he has. Advised she take him immediately to be evaluated in ED. She stated she would rather go to his PCP and get ED referral if necessary, stated "he will be seen faster". Wife left office. This RN called her mobile number and LVM strongly advising her to take patient straight to ED for evaluation and not spend time trying to see PCP. Advised he should be seen in ED right away due to his symptoms.  Wife will be unable to call office back until phone system is restored.

## 2015-09-09 NOTE — Progress Notes (Signed)
Addendum: wife stated, when she was in office this morning, that patient took double of all his medications yesterday.

## 2015-09-10 ENCOUNTER — Encounter (HOSPITAL_COMMUNITY): Payer: Self-pay

## 2015-09-11 ENCOUNTER — Encounter (HOSPITAL_COMMUNITY): Payer: Self-pay

## 2015-09-12 ENCOUNTER — Encounter (HOSPITAL_COMMUNITY): Payer: Self-pay

## 2015-09-12 ENCOUNTER — Other Ambulatory Visit: Payer: Self-pay | Admitting: Interventional Cardiology

## 2015-09-13 ENCOUNTER — Other Ambulatory Visit (HOSPITAL_COMMUNITY): Payer: Self-pay | Admitting: *Deleted

## 2015-09-13 ENCOUNTER — Encounter (HOSPITAL_COMMUNITY): Payer: Self-pay

## 2015-09-17 ENCOUNTER — Encounter (HOSPITAL_COMMUNITY): Payer: Self-pay

## 2015-09-18 ENCOUNTER — Encounter (HOSPITAL_COMMUNITY): Payer: Self-pay

## 2015-09-23 ENCOUNTER — Telehealth: Payer: Self-pay | Admitting: Diagnostic Neuroimaging

## 2015-09-23 NOTE — Telephone Encounter (Signed)
Pt wife called said he is wanting to increase gabapentin (NEURONTIN) 100 MG capsule to 300mg  3 xday.  Ph[facility]acy: [facility], [place]

## 2015-09-23 NOTE — Telephone Encounter (Signed)
Spoke with wife who stated patient saw Dr Felipa Eth who dx him with Bell's Palsy. He then went to his eye dr at [facility], had eyelid stitched closed because it wouldn't stay closed. She stated he is getting minimal relief of facial pain on Gabapentin 200 mg three x daily which he began 09/06/15. She is requesting this be increased to 300 mg three x daily. She stated he will need new prescription sent since dose has already been increased once. Informed her would route the request to Dr Leta Baptist. She verbalized understanding, appreciaiton.

## 2015-09-24 MED ORDER — GABAPENTIN 100 MG PO CAPS: 300.0000 mg | ORAL_CAPSULE | Freq: Three times a day (TID) | ORAL | Status: DC

## 2015-09-24 NOTE — Telephone Encounter (Signed)
Ok to increase gabapentin up to 300mg  three times per day. Rx sent in. -VRP

## 2015-09-24 NOTE — Telephone Encounter (Signed)
Pt's wife called in to check on rx of neurontin. Please call to advise  Pt will be out of medication tomorrow

## 2015-09-24 NOTE — Telephone Encounter (Signed)
Spoke with wife and informed her new prescription was sent in. Discussed her husband taking 200 mg twice a day, 300 mg at bedtime x 1 week to see how effective it is. If not, he may increase in increments up to 300 mg three times a day. Wife verbalized understanding of instructions, recommendations.

## 2015-09-25 ENCOUNTER — Encounter (HOSPITAL_COMMUNITY): Payer: Self-pay | Attending: Interventional Cardiology

## 2015-09-25 DIAGNOSIS — Z951 Presence of aortocoronary bypass graft: Secondary | ICD-10-CM | POA: Insufficient documentation

## 2015-09-26 ENCOUNTER — Encounter (HOSPITAL_COMMUNITY): Payer: Self-pay

## 2015-09-27 ENCOUNTER — Other Ambulatory Visit: Payer: Self-pay | Admitting: Otolaryngology

## 2015-09-27 DIAGNOSIS — H9202 Otalgia, left ear: Secondary | ICD-10-CM

## 2015-10-01 ENCOUNTER — Encounter (HOSPITAL_COMMUNITY): Payer: Self-pay

## 2015-10-02 ENCOUNTER — Encounter (HOSPITAL_COMMUNITY): Payer: Self-pay

## 2015-10-03 ENCOUNTER — Encounter (HOSPITAL_COMMUNITY): Payer: Self-pay

## 2015-10-08 ENCOUNTER — Inpatient Hospital Stay: Admission: RE | Admit: 2015-10-08 | Payer: Medicare Other | Source: Ambulatory Visit

## 2015-10-08 ENCOUNTER — Encounter (HOSPITAL_COMMUNITY): Payer: Self-pay

## 2015-10-09 ENCOUNTER — Encounter (HOSPITAL_COMMUNITY): Payer: Self-pay

## 2015-10-10 ENCOUNTER — Encounter (HOSPITAL_COMMUNITY): Payer: Self-pay

## 2015-10-15 ENCOUNTER — Encounter (HOSPITAL_COMMUNITY): Payer: Self-pay

## 2015-10-16 ENCOUNTER — Encounter (HOSPITAL_COMMUNITY): Payer: Self-pay

## 2015-10-17 ENCOUNTER — Encounter (HOSPITAL_COMMUNITY): Payer: Self-pay

## 2015-10-23 ENCOUNTER — Encounter (HOSPITAL_COMMUNITY): Payer: Medicare Other | Attending: Interventional Cardiology

## 2015-10-23 DIAGNOSIS — Z951 Presence of aortocoronary bypass graft: Secondary | ICD-10-CM | POA: Insufficient documentation

## 2015-10-24 ENCOUNTER — Encounter (HOSPITAL_COMMUNITY): Payer: Medicare Other

## 2015-10-29 ENCOUNTER — Ambulatory Visit: Payer: Medicare Other | Admitting: Diagnostic Neuroimaging

## 2015-10-29 ENCOUNTER — Encounter (HOSPITAL_COMMUNITY): Payer: Medicare Other

## 2015-10-30 ENCOUNTER — Encounter (HOSPITAL_COMMUNITY): Payer: Medicare Other

## 2015-10-30 ENCOUNTER — Ambulatory Visit (INDEPENDENT_AMBULATORY_CARE_PROVIDER_SITE_OTHER): Payer: Medicare Other | Admitting: Diagnostic Neuroimaging

## 2015-10-30 ENCOUNTER — Encounter: Payer: Self-pay | Admitting: Diagnostic Neuroimaging

## 2015-10-30 VITALS — BP 120/58 | HR 84 | Wt 147.8 lb

## 2015-10-30 DIAGNOSIS — I6523 Occlusion and stenosis of bilateral carotid arteries: Secondary | ICD-10-CM

## 2015-10-30 DIAGNOSIS — R220 Localized swelling, mass and lump, head: Secondary | ICD-10-CM

## 2015-10-30 DIAGNOSIS — K148 Other diseases of tongue: Secondary | ICD-10-CM

## 2015-10-30 DIAGNOSIS — S0452XS Injury of facial nerve, left side, sequela: Secondary | ICD-10-CM

## 2015-10-30 DIAGNOSIS — R221 Localized swelling, mass and lump, neck: Secondary | ICD-10-CM | POA: Diagnosis not present

## 2015-10-30 DIAGNOSIS — G523 Disorders of hypoglossal nerve: Secondary | ICD-10-CM

## 2015-10-30 DIAGNOSIS — S04892S Injury of other cranial nerves, left side, sequela: Secondary | ICD-10-CM

## 2015-10-30 DIAGNOSIS — G500 Trigeminal neuralgia: Secondary | ICD-10-CM

## 2015-10-30 NOTE — Progress Notes (Signed)
[facility]  PATIENT: [name] DOB: 1930-12-30  REFERRING CLINICIAN: Victorio Palm, DDS HISTORY FROM: patient and wife  REASON FOR VISIT: FOLLOW UP    HISTORICAL  CHIEF COMPLAINT:  Chief Complaint  Patient presents with  . Trigeminal neuralgia    rm 7, wife Fraser Din, "cont to have left facial pain, into jaw, ear; shooting/sharp at times; tylenol and gabapentin helpful; dx with Bell's palsy"  . Follow-up    3 month    HISTORY OF PRESENT ILLNESS:   UPDATE 10/30/15: Since last visit, symptoms continue. Now with left facial weakness in end of May 2017, dx'd with bell's palsy by PCP and tx'd by ophthalmology. I reviewed MRI images today and with patient. There appears to be a mass at base of tongue in extending to soft tissues in the left neck. Also went to [facility], had CT jaw, and found to have possible mass in the left lingual tonsils. Also saw Dr. Wilburn Cornelia ENT, and now has appt for MRI neck on Monday.   PRIOR HPI (08/02/15): 82 year old right-handed male here for evaluation of left facial pain. Patient has history of hypertension, hypercholesteremia, heart disease, atrial fibrillation and bladder cancer. In the 1960s patient had onset of sharp stabbing shocks of severe electrical pain in the left side of his face. He was diagnosed with left-sided trigeminal neuralgia. He underwent some type of surgery in [place] without benefit in symptoms. He was then referred through a friend to a neurosurgeon at [facility] who performed a different type of surgery. Following this his symptoms significantly improved. Since that time he had done quite well over many decades. August 2016 patient had onset of left-sided in her ear pain. Symptoms were gradual onset and progressive. At one point he yawned and felt a "pop sensation" in the left ear. He went to PCP and ENT for evaluation. No specific cause of symptoms were found. Patient was then referred to Dr. Girtha Rm  for evaluation of possible TMJ dysfunction. 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 He was treated with nonsurgical treatments to lyse possible soft tissue adhesions. Following treatments some pain in the preauricular region was reduced. However patient was also having additional pain, dull sensation in the left forehead, left scalp, left ear and left face. He rates this as 3 out of 10 in severity. Other times symptoms suddenly increase up to 7 or 8 out of 10. Symptoms can last 60 seconds up to 1 hour at a time. No specific triggering or alleviating factors. Sometimes pain is triggered by chewing or talking but not consistently. Patient has started to avoid chewing on the left side as he worries that may trigger pain. No other new problems in his vision, arms, legs, torso. No significant headaches, nausea or vomiting. No photophobia or phonophobia.    REVIEW OF SYSTEMS: Full 14 system review of systems performed and negative with exception of: Weight loss fatigue hearing loss itching urination problem impotence blood in urine impotence incontinence or shortness of breath feeling cold pain in jaw and ear not asleep decreased energy.  ALLERGIES: Allergies  Allergen Reactions  . Sulfa Antibiotics Shortness Of Breath  . Ramipril Other (See Comments)    unknown     HOME MEDICATIONS: Outpatient Prescriptions Prior to Visit  Medication Sig Dispense Refill  . allopurinol (ZYLOPRIM) 100 MG tablet Take 100 mg by mouth 2 (two) times daily.     Marland Kitchen  amLODipine (NORVASC) 2.5 MG tablet Take 1 tablet (2.5 mg total) by mouth daily. 90 tablet 1  . apixaban (ELIQUIS) 2.5 MG TABS tablet Take 1 tablet (2.5 mg total) by mouth 2 (two) times daily. 180 tablet 3  . atorvastatin (LIPITOR) 40 MG tablet Take 40 mg by mouth daily at 6 PM.     . citric acid-sodium citrate (ORACIT) 334-500 MG/5ML solution Take 15 mLs by mouth every  morning. 1 Tablespoon Daily    . Cyanocobalamin (VITAMIN B-12 IJ) Inject 1 each as directed every 30 (thirty) days.    Mariane Baumgarten Sodium (COLACE PO) Take 1 tablet by mouth daily as needed (FOR CONTIPATION).    . famotidine (TH FAMOTIDINE 10) 10 MG tablet Take 10 mg by mouth every morning.    . furosemide (LASIX) 40 MG tablet Take 1 tablet (40 mg total) by mouth every other day. 45 tablet 1  . gabapentin (NEURONTIN) 100 MG capsule Take 3 capsules (300 mg total) by mouth 3 (three) times daily. 270 capsule 12  . Tiotropium Bromide Monohydrate (SPIRIVA HANDIHALER IN) Inhale 2 puffs into the lungs daily.    Marland Kitchen doxycycline (VIBRAMYCIN) 100 MG capsule Reported on 08/02/2015     No facility-administered medications prior to visit.    PAST MEDICAL HISTORY: Past Medical History  Diagnosis Date  . Aortic valve disorder     s/p TAVR at [facility]   . PVD (peripheral vascular disease) ([place])   . Hypercholesteremia   . HTN (hypertension)   . Atrial fibrillation (HCC)     Chronic  . Atrial flutter ([place])   . CKD (chronic kidney disease)   . AAA (abdominal aortic aneurysm) ([place])   . CAD (coronary artery disease)     Status post CABG, stent to diagonal prior to TAVR  . Bladder cancer ([place])   . Gallstone   . Vitamin B12 deficiency   . Colon cancer ([place])   . Carotid stenosis   . Gout   . Cancer ([place])     basal cell  . Bell's palsy     PAST SURGICAL HISTORY: Past Surgical History  Procedure Laterality Date  . Coronary artery bypass graft  02/2000  . Laminectomy    . Appendectomy    . Colectomy  07/2002    colon cancer  . Toe surgery    . Hemicolectomy    . Mohs surgery  09/2005    nose  . Angioplasty  04/2002  . Esophagogastroduodenoscopy N/A 06/28/2013    Procedure: ESOPHAGOGASTRODUODENOSCOPY (EGD);  Surgeon: Winfield Cunas., MD;  Location: [facility] ENDOSCOPY;  Service: Endoscopy;  Laterality: N/A;  . Colonoscopy N/A 06/30/2013    Procedure: COLONOSCOPY;  Surgeon: Winfield Cunas., MD;   Location: [facility] ENDOSCOPY;  Service: Endoscopy;  Laterality: N/A;  . Colonoscopy with esophagogastroduodenoscopy (egd) N/A 07/03/2013    Procedure: COLONOSCOPY ;  Surgeon: Missy Sabins, MD;  Location: [place];  Service: Endoscopy;  Laterality: N/A;  Patient is already sedated on the ventilator and may need minimal if any additional sedation.   . Esophagogastroduodenoscopy N/A 07/03/2013    Procedure: ESOPHAGOGASTRODUODENOSCOPY (EGD);  Surgeon: Missy Sabins, MD;  Location[facility] ENDOSCOPY;  Service: Endoscopy;  Laterality: N/A;  . Givens capsule study N/A 07/03/2013    Procedure: GIVENS CAPSULE STUDY;  Surgeon: Missy Sabins, MD;  Location: [place];  Service: Endoscopy;  Laterality: N/A;  . Bladder surgery  02/2001    neo bladder replaced cancerous bladder, prostate removed  . Cataract surgery  Bilateral 07/25/09, 08/15/09  . Aortic valve replacement  08/2010    FAMILY HISTORY: Family History  Problem Relation Age of Onset  . Cancer Mother     Colon cancer  . Heart disease Father   . Diabetes Father   . Cancer Father   . Cancer Brother     esophageal    SOCIAL HISTORY:  Social History   Social History  . Marital Status: Married    Spouse Name: Mardene Celeste  . Number of Children: 3  . Years of Education: college 16   Occupational History  . RETIRED     [employer]   Social History Main Topics  . Smoking status: Former Smoker -- 3.00 packs/day for 32 years    Quit date: 08/02/1982  . Smokeless tobacco: Never Used  . Alcohol Use: Yes     Comment: occasional  . Drug Use: No  . Sexual Activity: Not on file   Other Topics Concern  . Not on file   Social History Narrative   lives at home with wife   Caffeine use- 3 a day     PHYSICAL EXAM  GENERAL EXAM/CONSTITUTIONAL: Vitals:  Filed Vitals:   10/30/15 1412  BP: 120/58  Pulse: 84  Weight: 147 lb 12.8 oz (67.042 kg)   Body mass index is 21.82 kg/(m^2). No exam data present  Patient is in no distress; well  developed, nourished and groomed; neck is supple  MOUTH OPEN AND CLOSE --> LEFT TMJ DOES NOT FULLY ARTICULATE; JAW DEVIATES TO THE LEFT SIDE WITH MOUTH OPENING  SUBMANDIBULAR AND SUBLINGUAL LYMPHADENOPATHY (LEFT MORE THAN RIGHT)  CARDIOVASCULAR:  Examination of carotid arteries is normal; no carotid bruits  IRREGULARLY IRREGULAR; SYSTOLIC MURMUR THROUGHOUT  Examination of peripheral vascular system by observation and palpation is normal  EYES:  Ophthalmoscopic exam of optic discs and posterior segments is normal; no papilledema or hemorrhages  MUSCULOSKELETAL:  Gait, strength, tone, movements noted in Neurologic exam below  NEUROLOGIC: MENTAL STATUS:  No flowsheet data found.  awake, alert, oriented to person, place and time  recent and remote memory intact  normal attention and concentration  language fluent, comprehension intact, naming intact,   fund of knowledge appropriate  CRANIAL NERVE:   2nd - no papilledema on fundoscopic exam  2nd, 3rd, 4th, 6th - pupils equal and reactive to light, visual fields full to confrontation, extraocular muscles intact, no nystagmus  5th - facial sensation; SLIGHTLY DECREASED LEFT V2, V3 PINPRICK  7th - facial strength --> DECR LEFT EYE CLOSURE (SLIGHTLY); DECR LEFT EYE BLINKING; DECR LEFT NL FOLD AND LEFT SMILE  8th - hearing intact  9th - palate elevates symmetrically, uvula midline  11th - shoulder shrug symmetric  12th - tongue protrusion --> DEVIATES TO THE LEFT SIDE  SLIGHTLY HOARSE VOICE  MOTOR:   normal bulk and tone, full strength in the BUE, BLE  SENSORY:   normal and symmetric to light touch, pinprick, temperature, vibration; EXCEPT ABSENT VIB AT TOES AND DECR VIB AT ANKLES  COORDINATION:   finger-nose-finger, fine finger movements normal  REFLEXES:   deep tendon reflexes present and symmetric; ABSENT AT ANKLES  GAIT/STATION:   narrow based gait; STOOPED POSTURE; DECR ARM SWING    DIAGNOSTIC  DATA (LABS, IMAGING, TESTING) - I reviewed patient records, labs, notes, testing and imaging myself where available.  Lab Results  Component Value Date   WBC 8.0 09/10/2014   HGB 14.4 09/10/2014   HCT 42.6 09/10/2014   MCV 97.2 09/10/2014  PLT 141.0* 09/10/2014      Component Value Date/Time   NA 139 09/13/2014 0755   K 4.0 09/13/2014 0755   CL 108 09/13/2014 0755   CO2 22 09/13/2014 0755   GLUCOSE 103* 09/13/2014 0755   BUN 49* 09/13/2014 0755   CREATININE 2.09* 09/13/2014 0755   CALCIUM 9.3 09/13/2014 0755   PROT 3.3* 07/04/2013 0408   ALBUMIN 1.4* 07/04/2013 0408   AST 15 07/04/2013 0408   ALT 8 07/04/2013 0408   ALKPHOS 60 07/04/2013 0408   BILITOT 0.4 07/04/2013 0408   GFRNONAA 26* 07/08/2013 0352   GFRAA 30* 07/08/2013 0352   No results found for: CHOL, HDL, LDLCALC, LDLDIRECT, TRIG, CHOLHDL No results found for: HGBA1C No results found for: VITAMINB12 Lab Results  Component Value Date   TSH 1.482 06/27/2013    05/17/15 MRI TMJ  - Left temporomandibular joint appears to be locked in the closed position with no motion detected on either the open or closed mouth views. There is no meniscal dislocation or significant degenerative change.  - Normal right temporomandibular joint.  08/12/15 MRI brain  1. Scattered T2/FLAIR hyperintense foci in the hemispheres and pons consistent with mild chronic microvascular ischemic change. 2. Mild to moderate cortical atrophy 3. There is a loop of the left anterior inferior cerebellar artery adjacent to the left trigeminal nerve. There does not appear to be significant distortion of the nerve but a correlation between the vascular loop and facial pain is possible.  08/12/15 MRI orbits - normal MRI of the orbits with and without count. Incidental note is made of a small retention cyst in the right maxillary sinus and very minimal mucoperiosteal thickening in some of the ethmoid air cells.   [I reviewed images from 08/12/15  myself and agree with interpretation for the brain; however there is also a mass lesion within the left base / body of tongue with extension into the left neck region, suspicious for malignancy.-VRP]     ASSESSMENT AND PLAN  82 y.o. year old male here with remote history of left-sided trigeminal neuralgia, status post surgery 2 in the 1960s, with good resolution of symptoms.   However now patient having recurrence of different type of symptoms on the left side since August 2016. Initially this started with intermittent sharp shooting left jaw, face and ear pain, then left facial weakness in end of May 2017. MRI brain and orbits / trigeminal protocol is reviewed personally by me today, and I disagree with report. There is in fact a mass lesion within the left base / body of tongue with extension into the left neck region, suspicious for malignancy. Therefore, the left face, jaw pain and weakness are likely due to this mass.   Dx: trigeminal neuralgia and left facial and left tongue weakness due to left neck / tongue mass with suspected perineural spread to left CN 5, 7, 12  1. Tongue mass   2. Neck mass   3. Trigeminal neuralgia of left side of face   4. Injury of left hypoglossal nerve, sequela   5. Hypoglossal neuropathy   6. Injury of left facial nerve, sequela      PLAN: - follow up MRI soft tissue of neck (without) --> ordered by Dr. Wilburn Cornelia ENT - I will discuss case with Dr. Wilburn Cornelia - continue gabapentin (will avoid carbamazepine due to patient age and possible reaction with apixaban)  Return in about 6 weeks (around 12/11/2015).  I reviewed images, labs, notes, records myself. I summarized  findings and reviewed with patient, for this high risk condition (severe facial pain, possible tongue / neck mass, chronic anti-coagulation) requiring high complexity decision making.    Penni Bombard, MD [id number], [id number] PM Certified in Neurology, Neurophysiology and  Neuroimaging  [facility] [address], [place], [place] [address] [phone]

## 2015-10-30 NOTE — Patient Instructions (Signed)
-   follow up MRI neck and with Dr. Wilburn Cornelia - continue gabapentin

## 2015-10-31 ENCOUNTER — Other Ambulatory Visit: Payer: Self-pay | Admitting: Otolaryngology

## 2015-10-31 ENCOUNTER — Encounter (HOSPITAL_COMMUNITY): Payer: Medicare Other

## 2015-10-31 ENCOUNTER — Other Ambulatory Visit: Payer: Self-pay

## 2015-10-31 DIAGNOSIS — R29810 Facial weakness: Secondary | ICD-10-CM

## 2015-10-31 DIAGNOSIS — R51 Headache: Secondary | ICD-10-CM

## 2015-10-31 DIAGNOSIS — R5381 Other malaise: Secondary | ICD-10-CM

## 2015-10-31 DIAGNOSIS — R519 Headache, unspecified: Secondary | ICD-10-CM

## 2015-11-05 ENCOUNTER — Encounter (HOSPITAL_COMMUNITY): Payer: Medicare Other

## 2015-11-05 ENCOUNTER — Other Ambulatory Visit: Payer: Self-pay | Admitting: Otolaryngology

## 2015-11-05 DIAGNOSIS — R519 Headache, unspecified: Secondary | ICD-10-CM

## 2015-11-05 DIAGNOSIS — R51 Headache: Secondary | ICD-10-CM

## 2015-11-05 DIAGNOSIS — R220 Localized swelling, mass and lump, head: Secondary | ICD-10-CM

## 2015-11-05 DIAGNOSIS — D490 Neoplasm of unspecified behavior of digestive system: Secondary | ICD-10-CM

## 2015-11-05 DIAGNOSIS — J358 Other chronic diseases of tonsils and adenoids: Secondary | ICD-10-CM

## 2015-11-05 DIAGNOSIS — R29810 Facial weakness: Secondary | ICD-10-CM

## 2015-11-06 ENCOUNTER — Encounter (HOSPITAL_COMMUNITY): Payer: Medicare Other

## 2015-11-07 ENCOUNTER — Encounter (HOSPITAL_COMMUNITY): Payer: Medicare Other

## 2015-11-11 ENCOUNTER — Other Ambulatory Visit: Payer: Self-pay | Admitting: Otolaryngology

## 2015-11-11 DIAGNOSIS — R51 Headache: Secondary | ICD-10-CM

## 2015-11-11 DIAGNOSIS — R29810 Facial weakness: Secondary | ICD-10-CM

## 2015-11-11 DIAGNOSIS — R519 Headache, unspecified: Secondary | ICD-10-CM

## 2015-11-11 DIAGNOSIS — J358 Other chronic diseases of tonsils and adenoids: Secondary | ICD-10-CM

## 2015-11-11 DIAGNOSIS — R220 Localized swelling, mass and lump, head: Principal | ICD-10-CM

## 2015-11-12 ENCOUNTER — Encounter (HOSPITAL_COMMUNITY): Payer: Medicare Other

## 2015-11-13 ENCOUNTER — Other Ambulatory Visit: Payer: Medicare Other

## 2015-11-13 ENCOUNTER — Ambulatory Visit
Admission: RE | Admit: 2015-11-13 | Discharge: 2015-11-13 | Disposition: A | Discharge status: Home / Self Care | Payer: Medicare Other | Source: Ambulatory Visit | Attending: Otolaryngology | Admitting: Otolaryngology

## 2015-11-13 ENCOUNTER — Encounter (HOSPITAL_COMMUNITY): Payer: Medicare Other

## 2015-11-13 DIAGNOSIS — R519 Headache, unspecified: Secondary | ICD-10-CM

## 2015-11-13 DIAGNOSIS — R51 Headache: Secondary | ICD-10-CM

## 2015-11-13 DIAGNOSIS — J358 Other chronic diseases of tonsils and adenoids: Secondary | ICD-10-CM

## 2015-11-13 DIAGNOSIS — R220 Localized swelling, mass and lump, head: Principal | ICD-10-CM

## 2015-11-13 DIAGNOSIS — R29810 Facial weakness: Secondary | ICD-10-CM

## 2015-11-14 ENCOUNTER — Encounter (HOSPITAL_COMMUNITY): Payer: Medicare Other

## 2015-11-19 ENCOUNTER — Inpatient Hospital Stay (HOSPITAL_COMMUNITY): Admission: RE | Admit: 2015-11-19 | Payer: Self-pay | Source: Ambulatory Visit

## 2015-11-19 ENCOUNTER — Encounter (HOSPITAL_COMMUNITY): Payer: Medicare Other | Attending: Interventional Cardiology

## 2015-11-19 ENCOUNTER — Other Ambulatory Visit: Payer: Self-pay | Admitting: Otolaryngology

## 2015-11-19 DIAGNOSIS — Z951 Presence of aortocoronary bypass graft: Secondary | ICD-10-CM | POA: Insufficient documentation

## 2015-11-20 ENCOUNTER — Encounter (HOSPITAL_COMMUNITY): Payer: Medicare Other

## 2015-11-21 ENCOUNTER — Encounter (HOSPITAL_COMMUNITY): Admission: RE | Admit: 2015-11-21 | Payer: Medicare Other | Source: Ambulatory Visit

## 2015-11-26 ENCOUNTER — Encounter (HOSPITAL_COMMUNITY): Payer: Medicare Other

## 2015-11-27 ENCOUNTER — Encounter (HOSPITAL_COMMUNITY): Payer: Medicare Other

## 2015-11-28 ENCOUNTER — Encounter (HOSPITAL_COMMUNITY): Payer: Medicare Other

## 2015-12-03 ENCOUNTER — Encounter: Payer: Self-pay | Admitting: *Deleted

## 2015-12-03 ENCOUNTER — Encounter (HOSPITAL_COMMUNITY): Payer: Medicare Other

## 2015-12-03 ENCOUNTER — Telehealth: Payer: Self-pay | Admitting: *Deleted

## 2015-12-03 ENCOUNTER — Telehealth: Payer: Self-pay | Admitting: Diagnostic Neuroimaging

## 2015-12-03 ENCOUNTER — Ambulatory Visit (HOSPITAL_BASED_OUTPATIENT_CLINIC_OR_DEPARTMENT_OTHER): Payer: Medicare Other | Admitting: Hematology and Oncology

## 2015-12-03 ENCOUNTER — Encounter: Payer: Self-pay | Admitting: Hematology and Oncology

## 2015-12-03 DIAGNOSIS — C029 Malignant neoplasm of tongue, unspecified: Secondary | ICD-10-CM | POA: Insufficient documentation

## 2015-12-03 DIAGNOSIS — I482 Chronic atrial fibrillation, unspecified: Secondary | ICD-10-CM

## 2015-12-03 DIAGNOSIS — C01 Malignant neoplasm of base of tongue: Secondary | ICD-10-CM

## 2015-12-03 DIAGNOSIS — N183 Chronic kidney disease, stage 3 unspecified: Secondary | ICD-10-CM

## 2015-12-03 DIAGNOSIS — Z8551 Personal history of malignant neoplasm of bladder: Secondary | ICD-10-CM

## 2015-12-03 DIAGNOSIS — Z952 Presence of prosthetic heart valve: Secondary | ICD-10-CM

## 2015-12-03 NOTE — Assessment & Plan Note (Signed)
He will continue medical management

## 2015-12-03 NOTE — Progress Notes (Signed)
[facility] CONSULT NOTE  Patient Care Team: Merlene Laughter, MD as PCP - General (Internal Medicine) Nadara Mustard, MD as Consulting Physician (Orthopedic Surgery) Artis Delay, MD as Consulting Physician (Hematology and Oncology) Osborn Coho, MD as Consulting Physician (Otolaryngology) Suanne Marker, MD as Consulting Physician (Neurology)  CHIEF COMPLAINTS/PURPOSE OF CONSULTATION:  Tongue cancer  HISTORY OF PRESENTING ILLNESS:  [name] 82 y.o. 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 I review his records extensively and summarized as follows:   Tongue cancer (HCC)   08/29/2015 Imaging    Outside ECHO at [facility]: NORMAL LEFT VENTRICULAR FUNCTION WITH MILD LVH, NORMAL RIGHT VENTRICULAR SYSTOLIC FUNCTION, VALVULAR REGURGITATION: MODERATE AR, MILD MR, MILD PR, MILD TR VALVULAR STENOSIS: MILD MS MILD-MODERATE PARAVALVULAR AORTIC REGURGITATION     09/26/2015 Miscellaneous    He was seen by Dr. Annalee Genta. Mild tongue fullness was noted and CT was recommended     11/13/2015 Imaging    MRI orbit showed Interval growth of left tonsillar mass centered at the glossal tonsillar sulcus and extending superiorly along the target mandibular raphe. There is no definite intracranial extension. Left level 2 and level 3 adenopathy      11/19/2015 Procedure    He underwent tongue biopsy with local anaethetics     11/19/2015 Pathology Results     [id number] Biopsy confirmed squamous cell carcinoma      he denies any hearing deficit but he has difficulties with chewing food. He denies swallowing difficulties, painful swallowing or changes in the quality of his voice the patient stated he has profound weight loss of over 50 pounds. On review of electronic records, he has only lost about 22 pounds  over the past year. The new onset of abnormal lymphadenopathy had been present for about 6 months The patient had major cardiac problems. He denies recent chest pain or shortness of breath. He has chronic bilateral lower extremity edema, right greater than the left due to the vein harvest for history of bypass surgery For his atypical headaches, he takes gabapentin and Tylenol as needed.  MEDICAL HISTORY:  Past Medical History:  Diagnosis Date  . AAA (abdominal aortic aneurysm) (HCC)   . Aortic valve disorder    s/p [facility]AVR at [facility]   . Atrial fibrillation (HCC)    Chronic  . Atrial flutter (HCC)   . Bell's palsy   . Bell's palsy   . Bladder cancer (HCC)   . CAD (coronary artery disease)    Status post CABG, stent to diagonal prior to TAVR  . Cancer (HCC)    basal cell  . Carotid stenosis   . CKD (chronic kidney disease)   . Colon cancer (HCC)   . Gallstone   . Gout   . HTN (hypertension)   . Hypercholesteremia   . PVD (peripheral vascular disease) (HCC)   . Vitamin B12 deficiency     SURGICAL HISTORY: Past Surgical History:  Procedure Laterality Date  . ANGIOPLASTY  04/2002  . AORTIC VALVE REPLACEMENT  08/2010  . APPENDECTOMY    . BLADDER SURGERY  02/2001   neo bladder replaced cancerous bladder, prostate removed  . cataract surgery Bilateral 07/25/09, 08/15/09  . COLECTOMY  07/2002   colon cancer  . COLONOSCOPY N/A 06/30/2013   Procedure: COLONOSCOPY;  Surgeon: Vertell Novak.,  MD;  Location: [place] ENDOSCOPY;  Service: Endoscopy;  Laterality: N/A;  . COLONOSCOPY WITH ESOPHAGOGASTRODUODENOSCOPY (EGD) N/A 07/03/2013   Procedure: COLONOSCOPY ;  Surgeon: Missy Sabins, MD;  Location: [place];  Service: Endoscopy;  Laterality: N/A;  Patient is already sedated on the ventilator and may need minimal if any additional sedation.   . CORONARY ARTERY BYPASS GRAFT  02/2000  . ESOPHAGOGASTRODUODENOSCOPY N/A 06/28/2013   Procedure: ESOPHAGOGASTRODUODENOSCOPY (EGD);  Surgeon: Winfield Cunas., MD;  Location: [facility] ENDOSCOPY;  Service: Endoscopy;  Laterality: N/A;  . ESOPHAGOGASTRODUODENOSCOPY N/A 07/03/2013   Procedure: ESOPHAGOGASTRODUODENOSCOPY (EGD);  Surgeon: Missy Sabins, MD;  Location: [facility] ENDOSCOPY;  Service: Endoscopy;  Laterality: N/A;  . GIVENS CAPSULE STUDY N/A 07/03/2013   Procedure: GIVENS CAPSULE STUDY;  Surgeon: Missy Sabins, MD;  Location: [facility];  Service: Endoscopy;  Laterality: N/A;  . HEMICOLECTOMY    . LAMINECTOMY    . MOHS SURGERY  09/2005   nose  . TOE SURGERY      SOCIAL HISTORY: Social History   Social History  . Marital status: Married    Spouse name: Mardene Celeste  . Number of children: 3  . Years of education: college 16   Occupational History  . RETIRED Retired    Quarry manager   Social History Main Topics  . Smoking status: Former Smoker    Packs/day: 3.00    Years: 32.00    Quit date: 08/02/1982  . Smokeless tobacco: Never Used  . Alcohol use Yes     Comment: occasional  . Drug use: No  . Sexual activity: Not on file   Other Topics Concern  . Not on file   Social History Narrative   lives at home with wife, Fraser Din   Caffeine use- 3 a day    FAMILY HISTORY: Family History  Problem Relation Age of Onset  . Cancer Mother     Colon cancer  . Heart disease Father   . Diabetes Father   . Cancer Father   . Cancer Brother     esophageal    ALLERGIES:  is allergic to sulfa antibiotics and ramipril.  MEDICATIONS:  Current Outpatient Prescriptions  Medication Sig Dispense Refill  . acetaminophen (TYLENOL) 500 MG tablet Take 500 mg by mouth every 6 (six) hours as needed.    Marland Kitchen allopurinol (ZYLOPRIM) 100 MG tablet Take 100 mg by mouth 2 (two) times daily.     Marland Kitchen amLODipine (NORVASC) 2.5 MG tablet Take 1 tablet (2.5 mg total) by mouth daily. 90 tablet 1  . apixaban (ELIQUIS) 2.5 MG TABS tablet Take 1 tablet (2.5 mg total) by mouth 2 (two) times daily. 180 tablet 3  . atorvastatin (LIPITOR) 40 MG tablet Take 40 mg by mouth  daily at 6 PM.     . citric acid-sodium citrate (ORACIT) 334-500 MG/5ML solution Take 15 mLs by mouth every morning. 1 Tablespoon Daily    . Cyanocobalamin (VITAMIN B-12 IJ) Inject 1 each as directed every 30 (thirty) days.    Mariane Baumgarten Sodium (COLACE PO) Take 1 tablet by mouth daily as needed (FOR CONTIPATION).    . famotidine (TH FAMOTIDINE 10) 10 MG tablet Take 10 mg by mouth every morning.    . furosemide (LASIX) 40 MG tablet Take 1 tablet (40 mg total) by mouth every other day. 45 tablet 1  . gabapentin (NEURONTIN) 100 MG capsule Take 3 capsules (300 mg total) by mouth 3 (three) times daily. 270 capsule 12  . Tiotropium Bromide  Monohydrate (SPIRIVA HANDIHALER IN) Inhale 2 puffs into the lungs daily.    . traMADol (ULTRAM) 50 MG tablet Take by mouth every 6 (six) hours as needed.     No current facility-administered medications for this visit.     REVIEW OF SYSTEMS:   Constitutional: Denies fevers, chills or abnormal night sweats Eyes: Denies blurriness of vision, double vision or watery eyes Respiratory: Denies cough, dyspnea or wheezes Cardiovascular: Denies palpitation, chest discomfort Gastrointestinal:  Denies nausea, heartburn or change in bowel habits Skin: Denies abnormal skin rashes Neurological:Denies numbness, tingling or new weaknesses Behavioral/Psych: Mood is stable, no new changes  All other systems were reviewed with the patient and are negative.  PHYSICAL EXAMINATION: ECOG PERFORMANCE STATUS: 1 - Symptomatic but completely ambulatory  Vitals:   12/03/15 1344  BP: 127/71  Pulse: 70  Resp: 16  Temp: 98 F (36.7 C)   Filed Weights   12/03/15 1344  Weight: 147 lb 8 oz (66.9 kg)    GENERAL:alert, no distress and comfortable. He looks elderly and frail and appears mildly cachectic  SKIN: skin color, texture, turgor are normal, no rashes or significant lesions EYES: normal, conjunctiva are pink and non-injected, sclera clear OROPHARYNX:no exudate, no erythema  and lips, buccal mucosa, and tongue normal  NECK: supple, thyroid normal size, non-tender, without nodularity LYMPH: Hhe has palpable lymphadenopathy on the left side of his neck LUNGS: clear to auscultation and percussion with normal breathing effort HEART:Irregular rate and rhythm with valvular click and bilateral lower extremity edema ABDOMEN:abdomen soft, non-tender and normal bowel sounds Musculoskeletal:no cyanosis of digits and no clubbing  PSYCH: alert & oriented x 3 with fluent speech NEURO: no focal motor/sensory deficits  LABORATORY DATA:  I have reviewed the data as listed Lab Results  Component Value Date   WBC 8.0 09/10/2014   HGB 14.4 09/10/2014   HCT 42.6 09/10/2014   MCV 97.2 09/10/2014   PLT 141.0 (L) 09/10/2014   Lab Results  Component Value Date   NA 139 09/13/2014   K 4.0 09/13/2014   CL 108 09/13/2014   CO2 22 09/13/2014    RADIOGRAPHIC STUDIES:I reviewed imaging study with him and his wife  I have personally reviewed the radiological images as listed and agreed with the findings in the report. Mr Farley Ly Cm  Result Date: 11/13/2015 CLINICAL DATA:  Left-sided facial pain and headaches for 1 year. Tonsillar mass. Facial weakness. EXAM: MRI FACE WITHOUT CONTRAST TECHNIQUE: Multiplanar, multiecho pulse sequences of the face and surrounding structures were obtained without contrast. COMPARISON:  MRI brain and orbits 08/12/2015. FINDINGS: MRI FACE FINDINGS There is a mass lesion centered at the left glossal tonsillar sulcus. This extends superiorly along the pterygomandibular raphe. Lesion demonstrates interval growth, now measuring 4.7 x 2.8 x 3.1 cm. The lesion extends to the midline of the tongue without cross the midline. This is based on T2 sequence measurements. The postcontrast images on prior exam demonstrated slightly smaller lesion. Postcontrast images were not obtained today. The tumor extends into the lateral oropharynx and hypopharynx. The floor of the  mouth is intact. A left level 2 lymph node is increased in size, now measuring 15 x 14 x 13 mm. The largest measurement previously was 13 mm. More inferiorly a larger level 2 node is stable the slightly larger measuring 17 x 17 x 18 mm on today's study. The lesion met previously measured 16 x 18 mm. Smaller left level 3 lymph nodes are present as well. No definite osseous infiltration  is evident. The pterygoid muscles are intact. Limited imaging of the brain is within normal limits for age. Bilateral lens replacements are noted. The visualized globes are unremarkable. A small polyp or mucous retention cyst is noted anteriorly in the right maxillary sinus. This is stable. The remaining paranasal sinuses and the mastoid air cells are clear. IMPRESSION: 1. Interval growth of left tonsillar mass centered at the glossal tonsillar sulcus and extending superiorly along the target mandibular raphe. There is no definite intracranial extension. 2. Left level 2 and level 3 adenopathy with slight progression since prior study. The largest lymph node measures 18 mm. 3. Despite the patient's renal insufficiency, his GFR was previously greater than 30 and a low-dose protocol could be used for further evaluation. Electronically Signed   By: San Morelle M.D.   On: 11/13/2015 17:24   ASSESSMENT:  Newly diagnosed squamous cell carcinoma of the Head & Neck, HPV N/A  PLAN:  Tongue cancer ([place]) He has locally advanced disease and has significant comorbidities.  Stage of the disease is to be determined, a PET/CT scan has been ordered.   Prognosis would depend on the results for the PET/CT scan, to be discussed and reviewed in the next visit.   Treatment options would include chemotherapy or radiation only.    In preparation for treatment, the patient will need the following tests or referrals, to be arranged #1 Referral to Radiation Oncology for consultation.  #2 Referral to dentist for full dental evaluation  and possible dental extraction  #3 PET/CT scan.  #4 Possible referral for feeding tube placement.  #5 Possible Infusaport placement #6 Referral to Speech Pathologist  #7 Referral to Nutritionist  #8 Referral to Social Worker #9 Possible Referral to chemotherapy class to learn about practical tips while on treatment.  #10 Blood work   CKD (chronic kidney disease) stage 3, GFR 30-59 ml/min He will continue medical management  Chronic atrial fibrillation [facility]) He will continue medical management and anticoagulation therapy as directed by his cardiologist  History of bladder cancer Would defer management to his urologist  S/P aortic valve replacement He will continue anticoagulation therapy and medical management    Orders Placed This Encounter  Procedures  . NM PET Image Initial (PI) Skull Base To Thigh    Standing Status:   Future    Standing Expiration Date:   12/02/2016    Order Specific Question:   Reason for Exam (SYMPTOM  OR DIAGNOSIS REQUIRED)    Answer:   staging tongue ca    Order Specific Question:   Preferred imaging location?    Answer:   [facility]  . Ambulatory Referral to Speech Therapy  (specifically to Garald Balding)    Referral Priority:   Routine    Referral Type:   Speech Therapy    Referral Reason:   Specialty Services Required    Requested Specialty:   Speech Pathology    Number of Visits Requested:   1  . Ambulatory Referral to Dentistry (specifically to Dr. Enrique Sack)    Referral Priority:   Routine    Referral Type:   Consultation    Referral Reason:   Specialty Services Required    Requested Specialty:   Dental General Practice    Number of Visits Requested:   1  . Amb Referral to Nutrition and Diabetic Education (specifically to Ernestene Kiel)    Referral Priority:   Routine    Referral Type:   Consultation    Referral Reason:  Specialty Services Required    Number of Visits Requested:   1  . Ambulatory Referral to Social Work     Referral Priority:   Routine    Referral Type:   Consultation    Referral Reason:   Specialty Services Required    Number of Visits Requested:   1  . Ambulatory Referral to Physical Therapy    Referral Priority:   Routine    Referral Type:   Physical Medicine    Referral Reason:   Specialty Services Required    Requested Specialty:   Physical Therapy    Number of Visits Requested:   1    All questions were answered. The patient knows to call the clinic with any problems, questions or concerns. I spent 55 minutes counseling the patient face to face. The total time spent in the appointment was 60 minutes and more than 50% was on counseling.     [facility], [place], MD 12/03/15 4:59 PM

## 2015-12-03 NOTE — Assessment & Plan Note (Addendum)
He has locally advanced disease and has significant comorbidities.  Stage of the disease is to be determined, a PET/CT scan has been ordered.   Prognosis would depend on the results for the PET/CT scan, to be discussed and reviewed in the next visit.   Treatment options would include chemotherapy or radiation only.    In preparation for treatment, the patient will need the following tests or referrals, to be arranged #1 Referral to Radiation Oncology for consultation.  #2 Referral to dentist for full dental evaluation and possible dental extraction  #3 PET/CT scan.  #4 Possible referral for feeding tube placement.  #5 Possible Infusaport placement #6 Referral to Speech Pathologist  #7 Referral to Nutritionist  #8 Referral to Social Worker #9 Possible Referral to chemotherapy class to learn about practical tips while on treatment.  #10 Blood work

## 2015-12-03 NOTE — Telephone Encounter (Signed)
LVM with name, number.

## 2015-12-03 NOTE — Assessment & Plan Note (Signed)
Would defer management to his urologist

## 2015-12-03 NOTE — Telephone Encounter (Signed)
Pts wife called and says she needs to give the nurse some information about a diagnosis her husband has. Please call and advise [phone]

## 2015-12-03 NOTE — Assessment & Plan Note (Signed)
He will continue medical management and anticoagulation therapy as directed by his cardiologist

## 2015-12-03 NOTE — Assessment & Plan Note (Signed)
He will continue anticoagulation therapy and medical management

## 2015-12-03 NOTE — Progress Notes (Signed)
  Oncology Nurse Navigator Documentation  Met with Mr. Nouri during initial consult with Dr. Alvy Bimler.  He was accompanied by his wife. 1. Further introduced myself as his/their Navigator, explained my role as a member of the Care Team. 2. Provided New Patient Information packet:  Contact information for physician, this navigator, other members of the Care Team  Advance Directive information ([place] blue pamphlet with LCSW insert) - He stated he has Living Will and HCPOA.  I asked that he bring copies next appt.  Fall Prevention Patient Safety Plan  Appointment Guideline  Financial Assistance Information sheet  [place] with highlight of [place]. 3. They understand I will arrange for baseline audiology with [facility]. 4. He experienced R hip/flank pain when he was prone during examination.  Further assessment may be needed to determine his ability to manage RT. 5. Showed his wife the location of Dr. Ritta Slot office and [facility] Radiology as reference for future appts, including arrival procedure for these appts.   6. They verbalized understanding of information provided. I encouraged them to call with questions/concerns, they verbalized understanding.  Gayleen Orem, RN, BSN, [place] at [facility] [phone]  Navigator Location: [place] [phone]/15/17 1345) Navigator Encounter Type: Initial MedOnc (12/03/15 1345)           Patient Visit Type: MedOnc (12/03/15 1345)   Barriers/Navigation Needs: No barriers at this time (12/03/15 1345)   Interventions: Education Method (12/03/15 1345)     Education Method: Verbal;Written;Tour (12/03/15 1345)                Time Spent with Patient: 60 (12/03/15 1345)

## 2015-12-03 NOTE — Telephone Encounter (Signed)
  Oncology Nurse Navigator Documentation  Placed introductory call to new referral patient Mr. Koss.  Spoke also with his wife.  Introduced myself as the H&N oncology nurse navigator that works with Drs Alvy Bimler and Isidore Moos to whom he has been referred by Dr. Wilburn Cornelia.   He confirmed his understanding of referrals and appt dates/times.    I briefly explained my role as his navigator, indicated that I would be joining them during his appt this afternoon with Dr. Alvy Bimler.  I confirmed his understanding of [place] location, explained arrival and MedOnc registration process for appt.  Gayleen Orem, RN, BSN, [place] at [facility] [phone]    Navigator Location: [facility]-Med Onc (12/03/15 0950) Navigator Encounter Type: Introductory phone call (12/03/15 0950)                                          Time Spent with Patient: 15 (12/03/15 0950)

## 2015-12-04 ENCOUNTER — Encounter: Payer: Self-pay | Admitting: Radiation Oncology

## 2015-12-04 ENCOUNTER — Encounter (HOSPITAL_COMMUNITY): Payer: Medicare Other

## 2015-12-04 ENCOUNTER — Telehealth: Payer: Self-pay | Admitting: *Deleted

## 2015-12-04 ENCOUNTER — Other Ambulatory Visit: Payer: Self-pay | Admitting: Hematology and Oncology

## 2015-12-04 NOTE — Telephone Encounter (Signed)
Spoke with wife who stated her husband has been diagnosed with oral cancer. She requested to cancel his FU with Dr Leta Baptist until further notice. Patient has multiple appointments coming up this month related to his new diagnosis. She also stated he increased his Gabapentin from 3- 100 mg tabs to 4- 100 mg tabs three times a day; this was noted on his MAR. She stated she would call back at a later time if he needed a follow up in this office. Will inform Dr Leta Baptist.

## 2015-12-04 NOTE — Telephone Encounter (Signed)
Patient's wife is returning a call.  

## 2015-12-05 ENCOUNTER — Encounter: Payer: Self-pay | Admitting: *Deleted

## 2015-12-05 ENCOUNTER — Ambulatory Visit (HOSPITAL_COMMUNITY): Payer: Self-pay | Admitting: Dentistry

## 2015-12-05 ENCOUNTER — Encounter (HOSPITAL_COMMUNITY): Payer: Medicare Other

## 2015-12-05 VITALS — BP 151/50 | HR 63 | Temp 98.2°F

## 2015-12-05 DIAGNOSIS — C029 Malignant neoplasm of tongue, unspecified: Secondary | ICD-10-CM

## 2015-12-05 DIAGNOSIS — K031 Abrasion of teeth: Secondary | ICD-10-CM

## 2015-12-05 DIAGNOSIS — Z7901 Long term (current) use of anticoagulants: Secondary | ICD-10-CM | POA: Diagnosis not present

## 2015-12-05 DIAGNOSIS — K08409 Partial loss of teeth, unspecified cause, unspecified class: Secondary | ICD-10-CM

## 2015-12-05 DIAGNOSIS — IMO0002 Reserved for concepts with insufficient information to code with codable children: Secondary | ICD-10-CM

## 2015-12-05 DIAGNOSIS — Z01818 Encounter for other preprocedural examination: Secondary | ICD-10-CM | POA: Diagnosis not present

## 2015-12-05 DIAGNOSIS — Z952 Presence of prosthetic heart valve: Secondary | ICD-10-CM

## 2015-12-05 DIAGNOSIS — M264 Malocclusion, unspecified: Secondary | ICD-10-CM

## 2015-12-05 DIAGNOSIS — K0530 Chronic periodontitis, unspecified: Secondary | ICD-10-CM

## 2015-12-05 DIAGNOSIS — M270 Developmental disorders of jaws: Secondary | ICD-10-CM

## 2015-12-05 DIAGNOSIS — K036 Deposits [accretions] on teeth: Secondary | ICD-10-CM

## 2015-12-05 DIAGNOSIS — K0889 Other specified disorders of teeth and supporting structures: Secondary | ICD-10-CM

## 2015-12-05 NOTE — Progress Notes (Signed)
Head and Neck Cancer Location of Tumor / Histology:  Left Tongue  Patient presented with symptoms of: Dr. Wilburn Cornelia documents on 11/19/15: He has been followed with a history of left otalgia and throat pain, workup including flexible laryngoscopy showed no evidence of mucosal lesion. Patient has undergone MRI scan in April 2017 and a dental CT scan in May 2017 which showed fullness in the left base of tongue. Follow-up MRI scan from 11/13/15 shows a 4.7 cm soft tissue mass involving the left tongue base with enlarged lymph nodes in the left lateral neck consistent with metastatic disease. The patient reports continued discomfort and otalgia, tolerating near-normal diet. His symptoms of tongue dysfunction and facial weakness have improved over the last month.  Biopsies revealed: 11/19/15 Diagnosis CONSULT SLIDE, HEAD AND NECK, FINE NEEDLE ASPIRATION (SPECIMEN 1 OF 1 COLLECTED 11-19-2015): POSITIVE FOR SQUAMOUS CELL CARCINOMA.  Nutrition Status Yes No Comments  Weight changes? []  [x]    Swallowing concerns? [x]  []  He tells me he has to eat food on the right side of his mouth. He is eating softer foods.   PEG? []  [x]     Referrals Yes No Comments  Social Work? []  [x]    Dentistry? []  []  Dr. Albin Felling 12/05/15 He will have scatter guards and fluoride trays done and referral to oral surgeon for a second opinion regarding tooth extractions. His second opinion apt is tomorrow (12/11/15)  Swallowing therapy? []  [x]    Nutrition? []  [x]    Med/Onc? [x]  []  Dr. Alvy Bimler 12/03/15   Safety Issues Yes No Comments  Prior radiation? []  [x]    Pacemaker/ICD? []  [x]    Possible current pregnancy? []  [x]    Is the patient on methotrexate? []  [x]     Tobacco/Marijuana/Snuff/ETOH use: Former smoker: Quit 08/02/1982 had a 3 pack a day habit for 32 years.   Past/Anticipated interventions by otolaryngology, if any:  11/19/15  Procedures:  Procedure: Fine needle aspiration biopsy (Left neck mass) Shanon Brow L. Wilburn Cornelia, M.D. [place]  [facility]  Past/Anticipated interventions by medical oncology, if any:  12/03/15 Dr. Alvy Bimler In preparation for treatment, the patient will need the following tests or referrals, to be arranged #1 Referral to Radiation Oncology for consultation.             #2 Referral to dentist for full dental evaluation and possible dental extraction  #3 PET/CT scan.  #4 Possible referral for feeding tube placement.  #5 Possible Infusaport placement #6 Referral to Speech Pathologist  #7 Referral to Nutritionist  #8 Referral to Social Worker #9 Possible Referral to chemotherapy class to learn about practical tips while on treatment.  #10 Blood work      Current Complaints / other details: MRI orbits 11/13/15   IMPRESSION: 1. Interval growth of left tonsillar mass centered at the glossal tonsillar sulcus and extending superiorly along the target mandibular raphe. There is no definite intracranial extension. 2. Left level 2 and level 3 adenopathy with slight progression since prior study. The largest lymph node measures 18 mm. 3. Despite the patient's renal insufficiency, his GFR was previously greater than 30 and a low-dose protocol could be used for further Evaluation.  12/10/15 PET IMPRESSION: 1. Intensely hypermetabolic left palatine tonsil/left glossotonsillar sulcus/left posterior tongue mass, consistent with primary head and neck carcinoma. 2. Hypermetabolic left level 2 and left level 3 cervical nodal metastases. 3. No definite hypermetabolic metastatic disease in the chest, abdomen or pelvis. 4. Mild hypermetabolism associated with multifocal patchy ground-glass opacity and consolidation in the right lung, favor infectious or inflammatory etiology.  Recommend attention on follow-up chest CT in 3 months. 5. Small to moderate layering right pleural effusion. 6. No metabolic evidence of recurrent tumor in the pelvis status post radical cystoprostatectomy with orthotopic neobladder. 7. Large  colonic stool volume may indicate constipation. 8. Additional findings include mild cardiomegaly, aortic atherosclerosis, coronary atherosclerosis status post CABG, mild centrilobular emphysema and stable 3.6 cm infrarenal abdominal aortic aneurysm.  BP (!) 144/71   Pulse 86   Temp 98.7 F (37.1 C)   Ht 5\' 9"  (1.753 m)   Wt 148 lb 14.4 oz (67.5 kg)   SpO2 94% Comment: room air  BMI 21.99 kg/m    Wt Readings from Last 3 Encounters:  12/11/15 148 lb 14.4 oz (67.5 kg)  12/03/15 147 lb 8 oz (66.9 kg)  10/30/15 147 lb 12.8 oz (67 kg)

## 2015-12-05 NOTE — Progress Notes (Signed)
DENTAL CONSULTATION  Date of Consultation:  12/05/2015 Patient Name:   [name] Date of Birth:   Jan 23, 1931 Medical Record Number: [id number]  VITALS: BP (!) 151/50   Pulse 63   Temp 98.2 F (36.8 C) (Oral)   CHIEF COMPLAINT: Patient referred by Dr. Alvy Bimler for dental consultation.   HPI: [name] is an 82 year old male recently diagnosed with cancer of the left tongue. Patient with anticipated radiation therapy. Patient is now seen as part of a medically necessary preradiation therapy dental protocol examination.  The patient currently denies acute toothaches, swellings, or abscesses. Patient was last seen by his primary dentist in August 2016 for a dental cleaning and examination. Patient sees Dr. Levin Erp as his primary dentist over the past several years. Patient previously saw Dr. Armandina Stammer until he retired 1-2 years ago.  Patient denies having any active dental needs. Patient denies having any partial dentures. Patient denies having dental phobia. Patient does have a history of some temporal mandibular joint dysfunction with previous treatment by Dr. Girtha Rm in [place] in January of 2017. Patient also has a history of "Bell's palsy" Involving the left side of his face since approximately May or June of 2017. Patient has been seen by a neurologist and is currently on gabapentin 400 mg 3 times daily. Patient indicates that the Bell's palsy is "80% better" than at initial onset. Patient still has some discomfort associated with the Bell's palsy involving the left temporal mandibular joint area. Patient also gives a history of seeing Dr. Cletis Athens at [facility] in the Orofacial Pain clinic in July 2017. That is where the original cone beam CT was obtained and mass involving the left base of tongue was noted by report of patient and wife. The patient is not undergoing any active treatment with Dr. Wynn Banker at this time.  PROBLEM LIST: Patient Active Problem List    Diagnosis Date Noted  . Tongue cancer ([place]) 12/03/2015  . S/P aortic valve replacement 12/03/2015  . Adenomatous colon polyp 07/10/2013  . Lower GI hemorrhage 07/01/2013  . Shock circulatory ([place]) 07/01/2013  . Acute on chronic renal failure ([place]) 06/30/2013  . Malnutrition of moderate degree ([place]) 06/27/2013  . Upper GI bleed 06/26/2013  . Anemia, blood loss 06/26/2013  . Pain in limb 06/05/2013  . Coronary atherosclerosis of native coronary artery 02/03/2013  . CKD (chronic kidney disease) stage 3, GFR 30-59 ml/min   . Aortic valve disorder   . PVD (peripheral vascular disease) ([place])   . HTN (hypertension)   . Chronic atrial fibrillation ([place])   . AAA (abdominal aortic aneurysm) ([place])   . History of bladder cancer   . Carotid stenosis     PMH: Past Medical History:  Diagnosis Date  . AAA (abdominal aortic aneurysm) ([place])   . Acute polyneuropathy related to gastric bypass surgery 2001   Quintuple bypass surgery  . Aortic valve disorder    s/p TAVR at [facility]   . Atrial fibrillation (H[facility])    Chronic  . Atrial flutter ([place])   . Bell's palsy   . Bell's palsy   . Bladder cancer ([place])   . CAD (coronary artery disease)    Status post CABG, stent to diagonal prior to TAVR  . Cancer ([place])    basal cell  . Carotid stenosis   . CKD (chronic kidney disease)   . Colon cancer ([place])   . Deviated septum 1950?  . Gallstone   . Gout   .  H/O laminectomy 1951   Staph Septicemia  . HTN (hypertension)   . Hypercholesteremia   . PVD (peripheral vascular disease) ([place])   . Vitamin B12 deficiency     PSH: Past Surgical History:  Procedure Laterality Date  . ANGIOPLASTY  04/2002  . AORTIC VALVE REPLACEMENT  08/2010  . APPENDECTOMY    . BLADDER SURGERY  02/2001   neo bladder replaced cancerous bladder, prostate removed  . cataract surgery Bilateral 07/25/09, 08/15/09  . COLECTOMY  07/2002   colon cancer  . COLONOSCOPY N/A 06/30/2013   Procedure: COLONOSCOPY;  Surgeon: Winfield Cunas., MD;  Location: [facility] ENDOSCOPY;  Service: Endoscopy;  Laterality: N/A;  . COLONOSCOPY WITH ESOPHAGOGASTRODUODENOSCOPY (EGD) N/A 07/03/2013   Procedure: COLONOSCOPY ;  Surgeon: Missy Sabins, MD;  Location: [facility];  Service: Endoscopy;  Laterality: N/A;  Patient is already sedated on the ventilator and may need minimal if any additional sedation.   . CORONARY ARTERY BYPASS GRAFT  02/2000  . ESOPHAGOGASTRODUODENOSCOPY N/A 06/28/2013   Procedure: ESOPHAGOGASTRODUODENOSCOPY (EGD);  Surgeon: Winfield Cunas., MD;  Location: [facility] ENDOSCOPY;  Service: Endoscopy;  Laterality: N/A;  . ESOPHAGOGASTRODUODENOSCOPY N/A 07/03/2013   Procedure: ESOPHAGOGASTRODUODENOSCOPY (EGD);  Surgeon: Missy Sabins, MD;  Location: [facility] ENDOSCOPY;  Service: Endoscopy;  Laterality: N/A;  . GIVENS CAPSULE STUDY N/A 07/03/2013   Procedure: GIVENS CAPSULE STUDY;  Surgeon: Missy Sabins, MD;  Location: [place];  Service: Endoscopy;  Laterality: N/A;  . HEMICOLECTOMY    . LAMINECTOMY    . MOHS SURGERY  09/2005   nose  . TOE SURGERY      ALLERGIES: Allergies  Allergen Reactions  . Sulfa Antibiotics Shortness Of Breath  . Ramipril Other (See Comments)    unknown     MEDICATIONS: Current Outpatient Prescriptions  Medication Sig Dispense Refill  . acetaminophen (TYLENOL) 500 MG tablet Take 500 mg by mouth every 6 (six) hours as needed.    Marland Kitchen allopurinol (ZYLOPRIM) 100 MG tablet Take 100 mg by mouth 2 (two) times daily.     Marland Kitchen amLODipine (NORVASC) 2.5 MG tablet Take 1 tablet (2.5 mg total) by mouth daily. 90 tablet 1  . apixaban (ELIQUIS) 2.5 MG TABS tablet Take 1 tablet (2.5 mg total) by mouth 2 (two) times daily. 180 tablet 3  . atorvastatin (LIPITOR) 40 MG tablet Take 40 mg by mouth daily at 6 PM.     . citric acid-sodium citrate (ORACIT) 334-500 MG/5ML solution Take 15 mLs by mouth every morning. 1 Tablespoon Daily    . Cyanocobalamin (VITAMIN B-12 IJ) Inject 1 each as directed every 30 (thirty) days.    Mariane Baumgarten Sodium (COLACE PO) Take 1 tablet by mouth daily as needed (FOR CONTIPATION).    . famotidine (TH FAMOTIDINE 10) 10 MG tablet Take 10 mg by mouth every morning.    . furosemide (LASIX) 40 MG tablet Take 1 tablet (40 mg total) by mouth every other day. 45 tablet 1  . gabapentin (NEURONTIN) 100 MG capsule Take 3 capsules (300 mg total) by mouth 3 (three) times daily. 270 capsule 12  . Tiotropium Bromide Monohydrate (SPIRIVA HANDIHALER IN) Inhale 2 puffs into the lungs daily.    . traMADol (ULTRAM) 50 MG tablet Take by mouth every 6 (six) hours as needed.     No current facility-administered medications for this visit.     LABS: Lab Results  Component Value Date   WBC 8.0 09/10/2014   HGB 14.4 09/10/2014  HCT 42.6 09/10/2014   MCV 97.2 09/10/2014   PLT 141.0 (L) 09/10/2014      Component Value Date/Time   NA 139 09/13/2014 0755   K 4.0 09/13/2014 0755   CL 108 09/13/2014 0755   CO2 22 09/13/2014 0755   GLUCOSE 103 (H) 09/13/2014 0755   BUN 49 (H) 09/13/2014 0755   CREATININE 2.09 (H) 09/13/2014 0755   CALCIUM 9.3 09/13/2014 0755   GFRNONAA 26 (L) 07/08/2013 0352   GFRAA 30 (L) 07/08/2013 0352   Lab Results  Component Value Date   INR 1.44 07/03/2013   INR 1.20 07/02/2013   INR 1.15 07/02/2013   No results found for: PTT  SOCIAL HISTORY: Social History   Social History  . Marital status: Married    Spouse name: Mardene Celeste  . Number of children: 3  . Years of education: college 16   Occupational History  . RETIRED Retired    Quarry manager   Social History Main Topics  . Smoking status: Former Smoker    Packs/day: 3.00    Years: 32.00    Quit date: 08/02/1982  . Smokeless tobacco: Never Used  . Alcohol use Yes     Comment: occasional  . Drug use: No  . Sexual activity: Not on file   Other Topics Concern  . Not on file   Social History Narrative   lives at home with wife, Fraser Din   Caffeine use- 3 a day    FAMILY HISTORY: Family History  Problem  Relation Age of Onset  . Cancer Mother     Colon cancer  . Heart disease Father   . Diabetes Father   . Cancer Father   . Cancer Brother     esophageal    REVIEW OF SYSTEMS: Reviewed with patient as per history of present illness.  Psych: Patient denies having dental phobia.  DENTAL HISTORY: CHIEF COMPLAINT: Patient referred by Dr. Alvy Bimler for dental consultation.   HPI: [name] is an 82 year old male recently diagnosed with cancer of the left tongue. Patient with anticipated radiation therapy. Patient is now seen as part of a medically necessary preradiation therapy dental protocol examination.  The patient currently denies acute toothaches, swellings, or abscesses. Patient was last seen by his primary dentist in August 2016 for a dental cleaning and examination. Patient sees Dr. Levin Erp as his primary dentist over the past several years. Patient previously saw Dr. Armandina Stammer until he retired 1-2 years ago.  Patient denies having any active dental needs. Patient denies having any partial dentures. Patient denies having dental phobia. Patient does have a history of some temporal mandibular joint dysfunction with previous treatment by Dr. Girtha Rm in [place] in January of 2017. Patient also has a history of "Bell's palsy" Involving the left side of his face since approximately May or June of 2017. Patient has been seen by a neurologist and is currently on gabapentin 400 mg 3 times daily. Patient indicates that the Bell's palsy is "80% better" than at initial onset. Patient still has some discomfort associated with the Bell's palsy involving the left temporal mandibular joint area. Patient also gives a history of seeing Dr. Cletis Athens at [facility] in the Orofacial Pain clinic in July 2017. That is where the original cone beam CT was obtained and mass involving the left base of tongue was noted by report of patient and wife. The patient is not undergoing any active  treatment with Dr. Wynn Banker at this time.  DENTAL EXAMINATION: GENERAL: The patient is a well-developed, slightly built male in no acute distress. HEAD AND NECK: There are two areas of left neck lymphadenopathy that are palpable. There is no right neck lymphadenopathy noted. Patient is complaining of some left temporal mandibular joint discomfort with palpation today. Patient has a deviation from right-to-left on maximum opening. The maximum interincisal opening is measured at 35 mm. INTRAORAL EXAM: Patient has normal saliva. Patient bilateral mandibular lingual tori. There is a palpable 1 cm mass involving the posterior extent of the left lateral tongue.  This is tender to palpation. This is also firm to palpation.  DENTITION: Patient is missing tooth numbers 1, 2, 12, 15 through 19, 24, 25, and 30-32. Tooth #12 has been replaced with an implant. Tooth numbers 24 and25 have been replaced as part of a bridge from tooth numbers 22,23 --26,27.  Multiple flexure lesions are noted. PERIODONTAL: The patient has chronic periodontitis with plaque and calculus accumulations, generalized gingival recession, furcation involvement of the molars, and tooth mobility as charted. DENTAL CARIES/SUBOPTIMAL RESTORATIONS: Patient has multiple flexure lesions. ENDODONTIC: Patient currently denies acute pulpitis symptoms. There is no evidence of periapical pathology. Tooth #14 has been treated with silver point root canal therapy. IMPLANT: Patient has an endosteal implant in the area of #12. CROWN AND BRIDGE: Patient has multiple crown or bridge restorations with a crown on the implant of #12 and a bridge from tooth numbers 22 through 27. PROSTHODONTIC: No history of partial dentures. OCCLUSION: The patient has a poor occlusal scheme and malocclusion with end to end and anterior occlusion, multiple diastemas, and superior eruption and drifting of the unopposed teeth into the edentulous areas.  RADIOGRAPHIC INTERPRETATION: An  orthopantogram was taken today. There are multiple missing teeth. There is moderate to severe bone loss. There is evidence of flexure lesions. Multiple diastemas are seen. There is a previous silver point root canal therapy associated with tooth #14. There is an implant in the area of tooth #12. There is a bridge from tooth numbers 22 through 27 noted.   ASSESSMENTS: 1. Cancer of the tongue. 2. Status post aortic valve replacement 3. Chronic anticoagulation with Eliquis 4. Risk for bleeding with invasive dental procedures 5. Need for antibiotic premedication prior to invasive dental procedures per American Heart Association guidelines 6. Preradiation therapy dental protocol examination 7. Chronic periodontitis of bone loss 8. Accretions 9. Gingival recession 10. Furcation involvement of the upper right and upper left molars 11. Tooth mobility as per dental charting form 12. Multiple missing teeth 13. Multiple diastemas 14. End to end and anterior occlusion 15. Supra-eruption and drifting of the unopposed teeth into the edentulous areas 16. Malocclusion 17. Multiple flexure lesions 18. Bilateral mandibular lingual tori  PLAN/RECOMMENDATIONS: 1. I discussed the risks, benefits, and complications of various treatment options with the patient and his wife in relationship to his medical and dental conditions, tongue cancer, chronic anticoagulation, and anticipated radiation therapy and radiation therapy side effects to include xerostomia, radiation caries, trismus, mucositis, taste changes, gum and jawbone changes, and risk for infection and osteoradionecrosis. We discussed various treatment options to include no treatment, multiple extractions with alveoloplasty, pre-prosthetic surgery as indicated, periodontal therapy, dental restorations, root canal therapy, crown and bridge therapy, implant therapy, and replacement of missing teeth as indicated. The patient currently wishes to proceed with  impressions today for the fabrication of fluoride trays and scatter protection devices. Patient also agrees to referral to Dr. Roney Jaffe, oral surgeon, for second opinion concerning extraction  of tooth numbers 3 and 14. This has been scheduled for 12/12/2015 at 2:30 PM.  Patient will also contact his primary dentist, Dr. Levin Erp, to schedule appointment for periodontal therapy at this time. Ideally this will take place prior to the anticipated oral surgical procedures scheduled after the oral surgery consultation. The patient will then return to dental medicine for insertion of fluoride trays and scatter protection devices prior to anticipated simulation appointment for radiation therapy with Dr. Isidore Moos.  2. Discussion of findings with medical team and coordination of future medical and dental care as needed.  I spent in excess of  120 minutes during the conduct of this consultation and >50% of this time involved direct face-to-face encounter for counseling and/or coordination of the patient's care.    Lenn Cal, DDS

## 2015-12-05 NOTE — Progress Notes (Signed)
  Oncology Nurse Navigator Documentation  Received call from Ms. [name], informing that Mr. [name] has appt with primary dentist for cleaning next Monday 8/21, appt with oral surgeon Dr. Loyal Gambler next Thursday, 8/24.  Parker Orem, RN, BSN, [place] at [facility] [phone]  Navigator Location: [place] [id number]/17/17 1128) Navigator Encounter Type: Telephone (12/05/15 1128) Telephone: Incoming Call;Appt Confirmation/Clarification (12/05/15 1128)                                        Time Spent with Patient: 15 (12/05/15 1128)

## 2015-12-05 NOTE — Telephone Encounter (Signed)
Sent fax to [facility] requesting baseline audiology, asked her to call me when appt scheduled.  'Successful TX Notice' received.  Gayleen Orem, RN, BSN, [place] at [place] [phone]

## 2015-12-05 NOTE — Patient Instructions (Signed)

## 2015-12-06 ENCOUNTER — Telehealth: Payer: Self-pay | Admitting: *Deleted

## 2015-12-06 NOTE — Telephone Encounter (Signed)
A user error has taken place: encounter opened in error, closed for administrative reasons.

## 2015-12-10 ENCOUNTER — Ambulatory Visit: Payer: Medicare Other | Admitting: Physical Therapy

## 2015-12-10 ENCOUNTER — Encounter (HOSPITAL_COMMUNITY)
Admission: RE | Admit: 2015-12-10 | Discharge: 2015-12-10 | Disposition: A | Discharge status: Home / Self Care | Payer: Medicare Other | Source: Ambulatory Visit | Attending: Hematology and Oncology | Admitting: Hematology and Oncology

## 2015-12-10 ENCOUNTER — Encounter (HOSPITAL_COMMUNITY): Payer: Medicare Other

## 2015-12-10 DIAGNOSIS — C029 Malignant neoplasm of tongue, unspecified: Secondary | ICD-10-CM

## 2015-12-10 LAB — GLUCOSE, CAPILLARY: GLUCOSE-CAPILLARY: 102 mg/dL — AB (ref 65–99)

## 2015-12-10 MED ORDER — FLUDEOXYGLUCOSE F - 18 (FDG) INJECTION
7.3400 | Freq: Once | INTRAVENOUS | Status: AC | PRN
  Administered 2015-12-10: 7.34 via INTRAVENOUS

## 2015-12-11 ENCOUNTER — Ambulatory Visit
Admission: RE | Admit: 2015-12-11 | Discharge: 2015-12-11 | Disposition: A | Discharge status: Home / Self Care | Payer: Medicare Other | Source: Ambulatory Visit | Attending: Radiation Oncology | Admitting: Radiation Oncology

## 2015-12-11 ENCOUNTER — Encounter: Payer: Self-pay | Admitting: Radiation Oncology

## 2015-12-11 ENCOUNTER — Encounter (HOSPITAL_COMMUNITY): Payer: Medicare Other

## 2015-12-11 ENCOUNTER — Ambulatory Visit: Payer: Medicare Other | Admitting: Diagnostic Neuroimaging

## 2015-12-11 ENCOUNTER — Telehealth: Payer: Self-pay | Admitting: *Deleted

## 2015-12-11 VITALS — BP 144/71 | HR 86 | Temp 98.7°F | Ht 69.0 in | Wt 148.9 lb

## 2015-12-11 DIAGNOSIS — I2510 Atherosclerotic heart disease of native coronary artery without angina pectoris: Secondary | ICD-10-CM | POA: Insufficient documentation

## 2015-12-11 DIAGNOSIS — Z79899 Other long term (current) drug therapy: Secondary | ICD-10-CM | POA: Diagnosis not present

## 2015-12-11 DIAGNOSIS — Z8551 Personal history of malignant neoplasm of bladder: Secondary | ICD-10-CM | POA: Insufficient documentation

## 2015-12-11 DIAGNOSIS — N189 Chronic kidney disease, unspecified: Secondary | ICD-10-CM | POA: Diagnosis not present

## 2015-12-11 DIAGNOSIS — Z85038 Personal history of other malignant neoplasm of large intestine: Secondary | ICD-10-CM | POA: Diagnosis not present

## 2015-12-11 DIAGNOSIS — Z833 Family history of diabetes mellitus: Secondary | ICD-10-CM | POA: Diagnosis not present

## 2015-12-11 DIAGNOSIS — I129 Hypertensive chronic kidney disease with stage 1 through stage 4 chronic kidney disease, or unspecified chronic kidney disease: Secondary | ICD-10-CM | POA: Insufficient documentation

## 2015-12-11 DIAGNOSIS — Z7901 Long term (current) use of anticoagulants: Secondary | ICD-10-CM | POA: Diagnosis not present

## 2015-12-11 DIAGNOSIS — Z888 Allergy status to other drugs, medicaments and biological substances status: Secondary | ICD-10-CM | POA: Diagnosis not present

## 2015-12-11 DIAGNOSIS — Z87891 Personal history of nicotine dependence: Secondary | ICD-10-CM | POA: Diagnosis not present

## 2015-12-11 DIAGNOSIS — C090 Malignant neoplasm of tonsillar fossa: Secondary | ICD-10-CM

## 2015-12-11 DIAGNOSIS — E538 Deficiency of other specified B group vitamins: Secondary | ICD-10-CM | POA: Insufficient documentation

## 2015-12-11 DIAGNOSIS — I739 Peripheral vascular disease, unspecified: Secondary | ICD-10-CM | POA: Diagnosis not present

## 2015-12-11 DIAGNOSIS — R634 Abnormal weight loss: Secondary | ICD-10-CM

## 2015-12-11 DIAGNOSIS — E7800 Pure hypercholesterolemia, unspecified: Secondary | ICD-10-CM | POA: Insufficient documentation

## 2015-12-11 DIAGNOSIS — I4891 Unspecified atrial fibrillation: Secondary | ICD-10-CM | POA: Diagnosis not present

## 2015-12-11 DIAGNOSIS — Z882 Allergy status to sulfonamides status: Secondary | ICD-10-CM | POA: Insufficient documentation

## 2015-12-11 DIAGNOSIS — Z510 Encounter for antineoplastic radiation therapy: Secondary | ICD-10-CM | POA: Diagnosis not present

## 2015-12-11 DIAGNOSIS — Z8249 Family history of ischemic heart disease and other diseases of the circulatory system: Secondary | ICD-10-CM | POA: Insufficient documentation

## 2015-12-11 HISTORY — DX: Other postprocedural complications and disorders of digestive system: K91.89

## 2015-12-11 HISTORY — DX: Polyneuropathy, unspecified: G62.9

## 2015-12-11 HISTORY — DX: Deviated nasal septum: J34.2

## 2015-12-11 HISTORY — DX: Other specified postprocedural states: Z98.890

## 2015-12-11 NOTE — Progress Notes (Signed)
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 Carcinoma of tonsillar fossa (HCC) 146.1 C09.0       HISTORY OF PRESENT ILLNESS::[name] is a 82 y.o. male who presented to Dr. Wilburn Cornelia of [facility] on 09/26/15 for a history of left otalgia and TMJ dysfunction. He was last seen by Dr. Wilburn Cornelia in December 2012 with a normal physical exam and complaint of left otalgia and facial pain. He had a history of trigeminal neuralgia which was treated with an ablative procedure over 30 years ago. The patient was also seen by Dr. Leta Baptist for Neurologic. Workup, including an MRI scan on 08/12/15, was negative and this raised concerns regarding possible recurrent trigeminal neuralgia. The patient developed acute left facial weakness in May 2017 that was diagnosed with Bell's palsy. The patient was evaluated by a TMJ specialist at [facility] and the findings of a noncontrast CT scan on 08/24/15 showed asymmetry in the lingual tonsil area. The patient continued to complain of left facial pain, facial weakness, and lateral tongue pain. During the encounter, Dr. Wilburn Cornelia performed a flexible laryngoscopy noting fullness in the left base of the tongue with no evidence of mass, tumor, or mucosal ulceration.  Follow up MRI scan on 11/13/15 revealed a 4.7 x 2.8 x 3.1 cm soft tissue mass involving the left base of the tongue with enlarged lymph nodes in the left lateral neck consistent with metastatic disease.  On 11/19/15, Dr. Wilburn Cornelia performed a fine needle aspiration biopsy of a 2 cm palpable firm mass in the left lateral neck. This  was positive for squamous cell carcinoma. According to tumor board discussion, P16 testing could not be conducted on the specimen.  The patient had a PET scan on 12/10/15 revealing a hypermetabolic mass involving the left palatine tonsil, left glossotonsillar sulcus, and left posterior tongue with SUV max 70.6, a hypermetabolic enlarged left level 2 cervical lymph node measuring 1.2 cm with SUV 6.7, and a mildly enlarged 0.8 cm left level 3 cervical lymph node with max SUV 3.6.  At tumor board, the consensus was that the patient should be treated with radiation alone given his comorbidities. Also present during this encounter was the patient's wife and Gayleen Orem, RN, our Head and Neck Oncology Navigator.   Swallowing issues, if any: The patient has to eat food on the right side of his mouth and he is eating softer foods.  Weight Changes: The patient has lost approximately 20 lbs over the past 2 years.  Other symptoms: The patient complains of thick saliva.  Tobacco history, if any: Quit smoking in 1984. Never used smokeless tobacco.  ETOH abuse, if any: He stopped drinking recently.  Prior cancers, if any: Bladder cancer. Colon cancer.  PREVIOUS RADIATION THERAPY: No  PAST MEDICAL HISTORY:  has a past medical history of AAA (abdominal aortic aneurysm) ([place]); Acute polyneuropathy related to gastric bypass surgery (2001); Aortic valve disorder; Atrial fibrillation ([place]); Atrial flutter ([place]); Bell's palsy; Bell's palsy; Bladder cancer ([place]); CAD (coronary artery disease); Cancer ([place]); Carotid stenosis; CKD (chronic kidney disease); Colon cancer ([place]); Deviated septum (1950?); Gallstone; Gout; H/O laminectomy (1951); HTN (hypertension);  Hypercholesteremia; PVD (peripheral vascular disease) ([place]); and Vitamin B12 deficiency.    PAST SURGICAL HISTORY: Past Surgical History:  Procedure Laterality Date  . ANGIOPLASTY  04/2002  . AORTIC VALVE REPLACEMENT  08/2010  . APPENDECTOMY    . BLADDER  SURGERY  02/2001   neo bladder replaced cancerous bladder, prostate removed  . cataract surgery Bilateral 07/25/09, 08/15/09  . COLECTOMY  07/2002   colon cancer  . COLONOSCOPY N/A 06/30/2013   Procedure: COLONOSCOPY;  Surgeon: Winfield Cunas., MD;  Location: [facility] ENDOSCOPY;  Service: Endoscopy;  Laterality: N/A;  . COLONOSCOPY WITH ESOPHAGOGASTRODUODENOSCOPY (EGD) N/A 07/03/2013   Procedure: COLONOSCOPY ;  Surgeon: Missy Sabins, MD;  Location: [place];  Service: Endoscopy;  Laterality: N/A;  Patient is already sedated on the ventilator and may need minimal if any additional sedation.   . CORONARY ARTERY BYPASS GRAFT  02/2000  . ESOPHAGOGASTRODUODENOSCOPY N/A 06/28/2013   Procedure: ESOPHAGOGASTRODUODENOSCOPY (EGD);  Surgeon: Winfield Cunas., MD;  Location: [facility] ENDOSCOPY;  Service: Endoscopy;  Laterality: N/A;  . ESOPHAGOGASTRODUODENOSCOPY N/A 07/03/2013   Procedure: ESOPHAGOGASTRODUODENOSCOPY (EGD);  Surgeon: Missy Sabins, MD;  Location: [facility] ENDOSCOPY;  Service: Endoscopy;  Laterality: N/A;  . GIVENS CAPSULE STUDY N/A 07/03/2013   Procedure: GIVENS CAPSULE STUDY;  Surgeon: Missy Sabins, MD;  Location: [place];  Service: Endoscopy;  Laterality: N/A;  . HEMICOLECTOMY    . LAMINECTOMY    . MOHS SURGERY  09/2005   nose  . TOE SURGERY      FAMILY HISTORY: family history includes Cancer in his brother, father, and mother; Diabetes in his father; Heart disease in his father.  SOCIAL HISTORY:  reports that he quit smoking about 33 years ago. He has a 96.00 pack-year smoking history. He has never used smokeless tobacco. He reports that he drinks alcohol. He reports that he does not use drugs.  ALLERGIES: Sulfa antibiotics and Ramipril  MEDICATIONS:  Current Outpatient Prescriptions  Medication Sig Dispense Refill  . acetaminophen (TYLENOL) 500 MG tablet Take 500 mg by mouth every 6 (six) hours as needed.    Marland Kitchen allopurinol (ZYLOPRIM) 100 MG tablet Take 100 mg by mouth 2 (two) times daily.      Marland Kitchen amLODipine (NORVASC) 2.5 MG tablet Take 1 tablet (2.5 mg total) by mouth daily. 90 tablet 1  . apixaban (ELIQUIS) 2.5 MG TABS tablet Take 1 tablet (2.5 mg total) by mouth 2 (two) times daily. 180 tablet 3  . atorvastatin (LIPITOR) 40 MG tablet Take 40 mg by mouth daily at 6 PM.     . citric acid-sodium citrate (ORACIT) 334-500 MG/5ML solution Take 15 mLs by mouth every morning. 1 Tablespoon Daily    . Cyanocobalamin (VITAMIN B-12 IJ) Inject 1 each as directed every 30 (thirty) days.    Mariane Baumgarten Sodium (COLACE PO) Take 1 tablet by mouth daily as needed (FOR CONTIPATION).    . famotidine (TH FAMOTIDINE 10) 10 MG tablet Take 10 mg by mouth every morning.    . furosemide (LASIX) 40 MG tablet Take 1 tablet (40 mg total) by mouth every other day. 45 tablet 1  . gabapentin (NEURONTIN) 100 MG capsule Take 3 capsules (300 mg total) by mouth 3 (three) times daily. 270 capsule 12  . Tiotropium Bromide Monohydrate (SPIRIVA HANDIHALER IN) Inhale 2 puffs into the lungs daily.    . traMADol (ULTRAM) 50 MG tablet Take by mouth every 6 (six) hours as needed.     No current facility-administered medications for  this encounter.     REVIEW OF SYSTEMS:  Notable for that above.   PHYSICAL EXAM:  height is _0  (1.753 m) and weight is 148 lb 14.4 oz (67.5 kg). His temperature is 98.7 F (37.1 C). His blood pressure is 144/71 (abnormal) and his pulse is 86. His oxygen saturation is 94%.   General: Alert and oriented, in no acute distress HEENT: Head is normocephalic. Extraocular movements are intact. Oropharynx is notable for a mass that is extending from the glossotonsillar sulcus to the superior and left lateral oral tongue. Tumor does not cross midline. Estimate clinically on exam to be about 3 cm in greatest dimension. The tumor is exophytic. Neck: Neck is notable for a 2 cm lymph node that is longer than it is wide at the angle of the left mandible. No other palpable masses appreciated in the cervical or  supraclavicular neck. Heart: His heart is regular in rate and irregular in rhythm with a murmur  Chest: Clear to auscultation bilaterally, with no rhonchi, wheezes, or rales. Abdomen: Soft, nontender, nondistended, with no rigidity or guarding. Extremities: Pitting edema in his right leg for 1 -1.5 months. No calf tenderness. States it has been waxing and waning due to a vein harvest for a history of bypass surgery. Lymphatics: see Neck Exam Skin: No concerning lesions. Musculoskeletal: Symmetric strength and muscle tone throughout. Presents in a wheelchair. Neurologic: Cranial nerves II through XII are grossly intact. No obvious focalities. Speech is fluent. Coordination is intact. Psychiatric: Judgment and insight are intact. Affect is appropriate.  ECOG = 3  0 - Asymptomatic (Fully active, able to carry on all predisease activities without restriction)  1 - Symptomatic but completely ambulatory (Restricted in physically strenuous activity but ambulatory and able to carry out work of a light or sedentary nature. For example, light housework, office work)  2 - Symptomatic, <50% in bed during the day (Ambulatory and capable of all self care but unable to carry out any work activities. Up and about more than 50% of waking hours)  3 - Symptomatic, >50% in bed, but not bedbound (Capable of only limited self-care, confined to bed or chair 50% or more of waking hours)  4 - Bedbound (Completely disabled. Cannot carry on any self-care. Totally confined to bed or chair)  5 - Death   Eustace Pen MM, Creech RH, Tormey DC, et al. [phone]). "Toxicity and response criteria of the [facility] Group". [place] Oncol. 5 (6): 649-55   LABORATORY DATA:  Lab Results  Component Value Date   WBC 8.0 09/10/2014   HGB 14.4 09/10/2014   HCT 42.6 09/10/2014   MCV 97.2 09/10/2014   PLT 141.0 (L) 09/10/2014   CMP     Component Value Date/Time   NA 139 09/13/2014 0755   K 4.0 09/13/2014 0755    CL 108 09/13/2014 0755   CO2 22 09/13/2014 0755   GLUCOSE 103 (H) 09/13/2014 0755   BUN 49 (H) 09/13/2014 0755   CREATININE 2.09 (H) 09/13/2014 0755   CALCIUM 9.3 09/13/2014 0755   PROT 3.3 (L) 07/04/2013 0408   ALBUMIN 1.4 (L) 07/04/2013 0408   AST 15 07/04/2013 0408   ALT 8 07/04/2013 0408   ALKPHOS 60 07/04/2013 0408   BILITOT 0.4 07/04/2013 0408   GFRNONAA 26 (L) 07/08/2013 0352   GFRAA 30 (L) 07/08/2013 0352      BUN 55, Cr 2.35 on 09-24-15  Lab Results  Component Value Date   TSH 1.482 06/27/2013  RADIOGRAPHY: Nm Pet Image Initial (pi) Skull Base To Thigh  Result Date: 12/10/2015 CLINICAL DATA:  Initial treatment strategy for tongue cancer. History of bladder cancer status post radical cystoprostatectomy and neobladder creation in 2002. Additional history of colectomy in 2004 for colon cancer. Appendectomy. EXAM: NUCLEAR MEDICINE PET SKULL BASE TO THIGH TECHNIQUE: 7.3 mCi F-18 FDG was injected intravenously. Full-ring PET imaging was performed from the skull base to thigh after the radiotracer. CT data was obtained and used for attenuation correction and anatomic localization. FASTING BLOOD GLUCOSE:  Value: 102 mg/dl COMPARISON:  07/31/2015 unenhanced CT abdomen/pelvis. FINDINGS: NECK Intensely hypermetabolic mass involving the left palatine tonsil, left glossotonsillar sulcus and left posterior tongue with max SUV 21.9. No gross intracranial extension by PET-CT. Hypermetabolic enlarged 1.2 cm left level 2 cervical lymph node with max SUV 6.7 (series 4/image 22). Hypermetabolic mildly enlarged 0.8 cm left level 3 cervical lymph node with max SUV 3.6 (series 4/image 36). No additional hypermetabolic lymph nodes in the neck. No hypermetabolic thyroid nodules. CHEST There are multiple foci of patchy ground-glass opacities and mild patchy consolidation in the medial segment right middle lobe, central right lower lobe and peripheral right upper lobe with associated mild  hypermetabolism, for example max SUV 4.6 in the central right lower lobe opacities. No hypermetabolic axillary, mediastinal or hilar nodes. Aortic valve prosthesis is in place. Left main, left anterior descending, left circumflex and right coronary atherosclerosis status post CABG with ascending aortic and left internal mammary bypass grafts. Atherosclerotic nonaneurysmal thoracic aorta. Mild cardiomegaly. Small to moderate layering right pleural effusion with mild compressive dependent right lower lobe atelectasis. No left pleural effusion. Mild centrilobular emphysema and diffuse bronchial wall thickening. ABDOMEN/PELVIS No abnormal hypermetabolic activity within the liver, pancreas, adrenal glands, or spleen. No hypermetabolic lymph nodes in the abdomen or pelvis. Cholecystectomy. Simple exophytic 9.0 cm posterior upper left renal cyst. No overt hydronephrosis. Status post radical cystoprostatectomy with orthotopic neobladder with no appreciable neobladder wall thickening. Aortic atherosclerosis. Infrarenal 3.6 cm abdominal aortic aneurysm, stable since 07/31/2015. Prominent stool throughout the colon. SKELETON No focal hypermetabolic activity to suggest skeletal metastasis. IMPRESSION: 1. Intensely hypermetabolic left palatine tonsil/left glossotonsillar sulcus/left posterior tongue mass, consistent with primary head and neck carcinoma. 2. Hypermetabolic left level 2 and left level 3 cervical nodal metastases. 3. No definite hypermetabolic metastatic disease in the chest, abdomen or pelvis. 4. Mild hypermetabolism associated with multifocal patchy ground-glass opacity and consolidation in the right lung, favor infectious or inflammatory etiology. Recommend attention on follow-up chest CT in 3 months. 5. Small to moderate layering right pleural effusion. 6. No metabolic evidence of recurrent tumor in the pelvis status post radical cystoprostatectomy with orthotopic neobladder. 7. Large colonic stool volume may  indicate constipation. 8. Additional findings include mild cardiomegaly, aortic atherosclerosis, coronary atherosclerosis status post CABG, mild centrilobular emphysema and stable 3.6 cm infrarenal abdominal aortic aneurysm. Electronically Signed   By: Ilona Sorrel M.D.   On: 12/10/2015 17:48   Mr Farley Ly Cm  Result Date: 11/13/2015 CLINICAL DATA:  Left-sided facial pain and headaches for 1 year. Tonsillar mass. Facial weakness. EXAM: MRI FACE WITHOUT CONTRAST TECHNIQUE: Multiplanar, multiecho pulse sequences of the face and surrounding structures were obtained without contrast. COMPARISON:  MRI brain and orbits 08/12/2015. FINDINGS: MRI FACE FINDINGS There is a mass lesion centered at the left glossal tonsillar sulcus. This extends superiorly along the pterygomandibular raphe. Lesion demonstrates interval growth, now measuring 4.7 x 2.8 x 3.1 cm. The lesion extends to the midline  of the tongue without cross the midline. This is based on T2 sequence measurements. The postcontrast images on prior exam demonstrated slightly smaller lesion. Postcontrast images were not obtained today. The tumor extends into the lateral oropharynx and hypopharynx. The floor of the mouth is intact. A left level 2 lymph node is increased in size, now measuring 15 x 14 x 13 mm. The largest measurement previously was 13 mm. More inferiorly a larger level 2 node is stable the slightly larger measuring 17 x 17 x 18 mm on today's study. The lesion met previously measured 16 x 18 mm. Smaller left level 3 lymph nodes are present as well. No definite osseous infiltration is evident. The pterygoid muscles are intact. Limited imaging of the brain is within normal limits for age. Bilateral lens replacements are noted. The visualized globes are unremarkable. A small polyp or mucous retention cyst is noted anteriorly in the right maxillary sinus. This is stable. The remaining paranasal sinuses and the mastoid air cells are clear. IMPRESSION: 1.  Interval growth of left tonsillar mass centered at the glossal tonsillar sulcus and extending superiorly along the target mandibular raphe. There is no definite intracranial extension. 2. Left level 2 and level 3 adenopathy with slight progression since prior study. The largest lymph node measures 18 mm. 3. Despite the patient's renal insufficiency, his GFR was previously greater than 30 and a low-dose protocol could be used for further evaluation. Electronically Signed   By: San Morelle M.D.   On: 11/13/2015 17:24     IMPRESSION/PLAN:  This is a delightful patient with head and neck cancer. I do recommend radiotherapy for this patient.  We discussed the potential risks, benefits, and side effects of radiotherapy. We talked in detail about acute and late effects. We discussed that some of the most bothersome acute effects may be mucositis, dysgeusia, salivary changes, skin irritation, hair loss, dehydration, weight loss and fatigue. We talked about late effects which include but are not necessarily limited to dysphagia, hypothyroidism, nerve injury, spinal cord injury, xerostomia, trismus, and neck edema. No guarantees of treatment were given. A consent form was signed and placed in the patient's medical record. The patient is enthusiastic about proceeding with treatment. I look forward to participating in the patient's care.    Simulation (treatment planning) will take place pending his referral to Dr. Loyal Gambler tomorrow. Anticipate 7 weeks of RT to the oral cavity/ oropharynx tumor and b/l neck  We also discussed that the treatment of head and neck cancer is a multidisciplinary process to maximize treatment outcomes and quality of life. For this reasons the following referrals have been or will be made:  Tumor board discussed that the patient should be treated with radiation alone given his comorbidities, but he could be seen by Dr. Alvy Bimler for supportive care.  Dentistry for dental  evaluation, possible extractions in the radiation fields, and /or advice on reducing risk of cavities, osteoradionecrosis, or other oral issues. The patient saw Dr. Enrique Sack on 12/05/15 and has been referred to Dr. Roney Jaffe, oral surgeon, for a second opinion concerning extraction of tooth numbers 3 and 14. This has been scheduled for 12/12/2015 at 2:30 PM.  Nutritionist for nutrition support during and after treatment. (The patient should be placed with a prophylactic PEG tube.) 20lb loss.  Speech language pathology for swallowing and/or speech therapy.  Social work for social support.  Physical therapy due to risk of lymphedema in neck and deconditioning.  Baseline labs including TSH. __________________________________________  Eppie Gibson, MD  This document serves as a record of services personally performed by Eppie Gibson, MD. It was created on her behalf by Darcus Austin, a trained medical scribe. The creation of this record is based on the scribe's personal observations and the provider's statements to them. This document has been checked and approved by the attending provider.

## 2015-12-12 ENCOUNTER — Encounter (HOSPITAL_COMMUNITY): Payer: Medicare Other

## 2015-12-12 ENCOUNTER — Other Ambulatory Visit: Payer: Self-pay | Admitting: Radiation Oncology

## 2015-12-12 ENCOUNTER — Telehealth: Payer: Self-pay | Admitting: *Deleted

## 2015-12-12 DIAGNOSIS — C090 Malignant neoplasm of tonsillar fossa: Secondary | ICD-10-CM

## 2015-12-12 NOTE — Telephone Encounter (Signed)
  Oncology Nurse Navigator Documentation  Received call from [facility], informing that Mr. Zajicek has appt tomorrow, 8/24.  Dr. Alvy Bimler informed.  Gayleen Orem, RN, BSN, [place] at [facility] [phone]   Navigator Location: [place] (12/11/15 1515) Navigator Encounter Type: Telephone (12/11/15 1515) Telephone: Incoming Call (12/11/15 1515)             Barriers/Navigation Needs: Coordination of Care (12/11/15 1515)                          Time Spent with Patient: 15 (12/11/15 1515)

## 2015-12-12 NOTE — Addendum Note (Signed)
Encounter addended by: Ernst Spell, RN on: 12/12/2015  7:59 AM<BR>    Actions taken: Care Teams modified

## 2015-12-12 NOTE — Telephone Encounter (Signed)
  Oncology Nurse Navigator Documentation  Faxed 'Anticipated Ports' prepared by Dr. Isidore Moos to Dr Loyal Gambler for his consultation with [name] this afternoon.  Successful transmission notice received.  Port sheet delivered to [name] for scanning.  Gayleen Orem, RN, BSN, [place] at [name] [phone]   Navigator Location: [place] (12/12/15 0855) Navigator Encounter Type: Letter/Fax/Email (12/12/15 0855) Telephone: [place] Call (12/12/15 0855)             Barriers/Navigation Needs: Coordination of Care (12/12/15 0855)                          Time Spent with Patient: 15 (12/12/15 0855)

## 2015-12-13 ENCOUNTER — Telehealth: Payer: Self-pay | Admitting: Interventional Cardiology

## 2015-12-13 NOTE — Telephone Encounter (Signed)
Stop Eliquis per PharmD protocol.

## 2015-12-13 NOTE — Telephone Encounter (Signed)
WILL FORWARD  TO   DR  Irish Lack FOR  REVIEW .Adonis Housekeeper

## 2015-12-13 NOTE — Telephone Encounter (Signed)
New message      Request for surgical clearance:  1. What type of surgery is being performed?  2 teeth extracted  2. When is this surgery scheduled?  Pending clearance  3. Are there any medications that need to be held prior to surgery and how long? Stop eliquis prior to extraction.  Cardiologist determine when to stop rx  Name of physician performing surgery? Dr Kinnie Scales 4. What is your office phone and fax number?  Fax [phone]

## 2015-12-16 ENCOUNTER — Telehealth: Payer: Self-pay | Admitting: Interventional Cardiology

## 2015-12-16 ENCOUNTER — Telehealth (HOSPITAL_COMMUNITY): Payer: Self-pay | Admitting: Dentistry

## 2015-12-16 NOTE — Telephone Encounter (Signed)
12/16/2015  Patient:            [name] Date of Birth:  December 12, 1930 MRN:                [id number]   I called Dr. Landry Corporal office. The patient was seen for oral surgery consultation with anticipated treatment plan of extraction of tooth numbers 3 and 14 pending medical clearance. Patient will be scheduled for extraction once medical clearance is obtained. Dr. Enrique Sack

## 2015-12-16 NOTE — Telephone Encounter (Signed)
New message     Pt verbalized that he wants to know if a fax was received from Dr. Loyal Gambler office it was sent 12/13/2015

## 2015-12-16 NOTE — Telephone Encounter (Signed)
New message   Radonna Ricker verbalized that she need to know what "Parm D Protocols" is. It was written by RN for surgical clearance sent on 12-16-15.   Fax [phone]

## 2015-12-16 NOTE — Telephone Encounter (Signed)
**Note De-Identified [name] Obfuscation** The pt and his wife is advised that a message has been sent to Pharm D asking for the Pharm D protocol on stopping Eliquis prior to dental procedures and that we will fax to Dr Kinnie Scales as soon as it is available. They both verbalized understanding.

## 2015-12-16 NOTE — Telephone Encounter (Signed)
What is the message. Nothing charted.

## 2015-12-16 NOTE — Telephone Encounter (Signed)
This message has been manually faxed to Dr Waynette Buttery office at [phone]. I did receive confirmation that the fax was successful.

## 2015-12-17 ENCOUNTER — Other Ambulatory Visit (HOSPITAL_COMMUNITY): Payer: Self-pay | Admitting: Dentistry

## 2015-12-17 ENCOUNTER — Telehealth: Payer: Self-pay | Admitting: *Deleted

## 2015-12-17 ENCOUNTER — Other Ambulatory Visit: Payer: Self-pay | Admitting: Interventional Cardiology

## 2015-12-17 ENCOUNTER — Encounter (HOSPITAL_COMMUNITY): Payer: Medicare Other

## 2015-12-17 ENCOUNTER — Telehealth: Payer: Self-pay | Admitting: Interventional Cardiology

## 2015-12-17 MED ORDER — SODIUM FLUORIDE 1.1 % DT GEL: DENTAL | 11 refills | Status: AC

## 2015-12-17 MED FILL — FLUORISHIELD 1.1% GEL: 1.1 % | 30 days supply | Qty: 114 | Fill #0

## 2015-12-17 NOTE — Telephone Encounter (Signed)
Looks like clearance was faxed with no actual clearance info. Please send the following clearance to Dr. Jeremy Johann office:  Patient takes Eliquis for afib, CHADS2 score of 2 (HTN and age > 75), no history of stroke. Ok to hold Eliquis x24 hours prior to 2 dental extractions.

## 2015-12-17 NOTE — Telephone Encounter (Signed)
CALLED PATIENT TO INFORM OF PEG TUBE PLACEMENT ON 01-01-16 @ 9:30 AM @ [facility], SPOKE WITH PATIENT AND HE IS AWARE OF THIS APPT.

## 2015-12-17 NOTE — Telephone Encounter (Signed)
New Message Request for surgical clearance:  1. What type of surgery is being performed? G2 placement    2. When is this surgery scheduled? September 13  3. Are there any medications that need to be held prior to surgery and how long? Eliquis   4. Name of physician performing surgery? Tiffany states does not know who will preform surgery. Dr. Isidore Moos order procedure   5. What is your office phone and fax number? [phone] Fax [phone]

## 2015-12-17 NOTE — Telephone Encounter (Signed)
Patient takes Eliquis for afib, CHADS2 score of 2 (HTN and age > 70), no history of stroke. Ok to hold Eliquis x48 hours prior to peg tube placement on 9/13. Anticoag clearance faxed to [phone]. Do not believe pt requires cardiac clearance for procedure but will have Dr. Irish Lack address any additional clearance required.

## 2015-12-17 NOTE — Telephone Encounter (Signed)
Will route this surgical/medication request to Dr.Varnanasi and Elberta Leatherwood, Pharm D for further review and recommendation clearance needed.   Dr.Varnanasi's nurse to follow-up accordingly after clearance given.

## 2015-12-17 NOTE — Telephone Encounter (Signed)
Clearance was faxed incorrectly with no actual clearance info. Has been sent.

## 2015-12-17 NOTE — Telephone Encounter (Signed)
Clearance was sent before actual clearance was done. See details from 8/25 clearance, has been sent.

## 2015-12-18 ENCOUNTER — Encounter (HOSPITAL_COMMUNITY): Payer: Medicare Other

## 2015-12-18 NOTE — Telephone Encounter (Signed)
This message has been manually faxed back to [facility] @ [phone]. I did receive confirmation that the fax was successful.

## 2015-12-18 NOTE — Telephone Encounter (Signed)
This message has been manually faxed to Dr Tad Moore office at [phone]. I did receive confirmation that the fax was successful.

## 2015-12-19 ENCOUNTER — Encounter (HOSPITAL_COMMUNITY): Payer: Medicare Other

## 2015-12-20 NOTE — Telephone Encounter (Signed)
No further cardiac testing required prior to procedure.

## 2015-12-24 ENCOUNTER — Ambulatory Visit (HOSPITAL_COMMUNITY): Payer: Self-pay | Admitting: Dentistry

## 2015-12-24 ENCOUNTER — Encounter (HOSPITAL_COMMUNITY): Payer: Medicare Other

## 2015-12-24 VITALS — BP 157/60 | HR 66 | Temp 98.0°F

## 2015-12-24 DIAGNOSIS — Z01818 Encounter for other preprocedural examination: Secondary | ICD-10-CM

## 2015-12-24 DIAGNOSIS — Z463 Encounter for fitting and adjustment of dental prosthetic device: Secondary | ICD-10-CM

## 2015-12-24 DIAGNOSIS — K08409 Partial loss of teeth, unspecified cause, unspecified class: Secondary | ICD-10-CM

## 2015-12-24 DIAGNOSIS — C029 Malignant neoplasm of tongue, unspecified: Secondary | ICD-10-CM

## 2015-12-24 NOTE — Progress Notes (Signed)
PROGRESS NOTE:  12/24/2015 [name] [id number]  VITALS: BP (!) 157/60   Pulse 66   Temp 98 F (36.7 C) (Oral)   [name] is status post extraction of tooth numbers 3 and 14 with the oral surgeon, Dr. Loyal Gambler, on 12/20/2015. The patient now presents for insertion of upper and lower fluoride trays and scatter protection devices prior to anticipated radiation therapy.  SUBJECTIVE: Patient with minimal discomfort from dental extraction sites. Upper left is sore at times. Patient denies having any active bleeding.  EXAM: There is no sign of infection, heme, or ooze. Sutures are intact. Clots are present.  ASSESSMENT: Post operative course is consistent with dental procedures performed by the oral surgeon.  PLAN: 1. Continue with postoperative directions provided by the oral surgeon. Follow-up with Dr. Loyal Gambler for evaluation of healing, suture removal, and determination of the date that radiation therapy may start. 2. Proceed with insertion of fluoride trays and scatter guards-today. 3. Follow up with Radiation oncology for simulation appointment. Marland Kitchen  PROCEDURE: Appliances were tried in and adjusted as needed. [place] Trismus device was fabricated at 40 mm with 24 sticks. Postop instructions were provided and a written and verbal format concerning the use and care of appliances. All questions were answered. Patient to follow-up with simulation with Dr. Isidore Moos as scheduled tomorrow morning at 8: 00 am. Patient to return to clinic for periodic oral examination in approximately 2-3 weeks during radiation therapy. Patient to call if questions or problems arise before then.   Lenn Cal, DDS

## 2015-12-24 NOTE — Patient Instructions (Addendum)
  PLAN: 1. Continue with postoperative directions provided by the oral surgeon. Follow-up with Dr. Loyal Gambler for evaluation of healing, suture removal, and determination of the date that radiation therapy may start. 2. Proceed with insertion of fluoride trays and scatter guards-today. 3. Follow up with Radiation oncology for simulation appointment.   FLUORIDE TRAYS PATIENT INSTRUCTIONS    Obtain prescription from the pharmacy.  Don't be surprised if it needs to be ordered.  Be sure to let the pharmacy know when you are close to needing a new refill for them to have it ready for you without interruption of Fluoride use.  The best time to use your Fluoride is before bed time.  You must brush your teeth very well and floss before using the Fluoride in order to get the best use out of the Fluoride treatments.  Place 1 drop of Fluoride gel per tooth in the tray.  Place the tray on your lower teeth and your upper teeth.  Make sure the trays are seated all the way.  Remember, they only fit one way on your teeth.  Insert for 5 full minutes.  At the end of the 5 minutes, take the trays out.  SPIT OUT excess. .  Do NOT rinse your mouth!  Do NOT eat or drink after treatments for at least 30 minutes.  This is why the best time for your treatments is before bedtime.  Clean the inside of your Fluoride trays using COLD WATER and a toothbrush.  In order to keep your Trays from discoloring and free from odors, soak them overnight in denture cleaners such as Efferdent.  Do not use bleach or non denture products.  Store the trays in a safe dry place AWAY from any heat until your next treatment.  If anything happens to your Fluoride trays, or they don't fit as well after any dental work, please let us know as soon as possible.   TRISMUS  Trismus is a condition where the jaw does not allow the mouth to open as wide as it usually does.  This can happen almost suddenly, or in other cases the process  is so slow, it is hard to notice it-until it is too far along.  When the jaw joints and/or muscles have been exposed to radiation treatments, the onset of Trismus is very slow.  This is because the muscles are losing their stretching ability over a long period of time, as long as 2 YEARS after the end of radiation.  It is therefore important to exercise these muscles and joints.  TRISMUS EXERCISES   Stack of tongue depressors measuring the same or a little less than the last documented MIO (Maximum Interincisal Opening).  Secure them with a rubber band on both ends.  Place the stack in the patient's mouth, supporting the other end.  Allow 30 seconds for muscle stretching.  Rest for a few seconds.  Repeat 3-5 times  For all radiation patients, this exercise is recommended in the mornings and evenings unless otherwise instructed.  The exercise should be done for a period of 2 YEARS after the end of radiation.  MIO should be checked routinely on recall dental visits by the general dentist or the hospital dentist.  The patient is advised to report any changes, soreness, or difficulties encountered when doing the exercises.

## 2015-12-25 ENCOUNTER — Ambulatory Visit
Admission: RE | Admit: 2015-12-25 | Discharge: 2015-12-25 | Disposition: A | Discharge status: Home / Self Care | Payer: Medicare Other | Source: Ambulatory Visit | Attending: Radiation Oncology | Admitting: Radiation Oncology

## 2015-12-25 ENCOUNTER — Encounter (HOSPITAL_COMMUNITY): Payer: Medicare Other

## 2015-12-25 ENCOUNTER — Other Ambulatory Visit: Payer: Self-pay | Admitting: Hematology and Oncology

## 2015-12-25 DIAGNOSIS — C090 Malignant neoplasm of tonsillar fossa: Secondary | ICD-10-CM

## 2015-12-25 DIAGNOSIS — Z510 Encounter for antineoplastic radiation therapy: Secondary | ICD-10-CM | POA: Diagnosis not present

## 2015-12-25 DIAGNOSIS — C029 Malignant neoplasm of tongue, unspecified: Secondary | ICD-10-CM

## 2015-12-25 NOTE — Progress Notes (Signed)
Head and Neck Cancer Simulation, IMRT treatment planning note   Outpatient  Diagnosis:    ICD-9-CM ICD-10-CM   1. Carcinoma of tonsillar fossa (HCC) 146.1 C09.0     The patient was taken to the CT simulator and laid in the supine position on the table. An Aquaplast head and shoulder mask was custom fitted to the patient's anatomy. High-resolution CT axial imaging was obtained of the head and neck without contrast. I verified that the quality of the imaging is good for treatment planning. 1 Medically Necessary Treatment Device was fabricated and supervised by me: Aquaplast mask.   Treatment planning note I plan to treat the patient with IMRT. I plan to treat the patient's tumor and bilateral neck nodes. I plan to treat to a total dose of 70 Gray in 35  fractions. Dose calculation was ordered from dosimetry.  IMRT planning Note  IMRT is an important modality to deliver adequate dose to the patient's at risk tissues while sparing the patient's normal structures, including the: esophagus, parotid tissue, mandible, brain stem, spinal cord, oral cavity, brachial plexus.  This justifies the use of IMRT in the patient's treatment.   -----------------------------------  Eppie Gibson, MD

## 2015-12-26 ENCOUNTER — Encounter (HOSPITAL_COMMUNITY): Payer: Medicare Other

## 2015-12-31 ENCOUNTER — Other Ambulatory Visit: Payer: Self-pay | Admitting: General Surgery

## 2015-12-31 ENCOUNTER — Ambulatory Visit: Payer: Medicare Other | Attending: Hematology and Oncology

## 2015-12-31 ENCOUNTER — Ambulatory Visit
Admission: RE | Admit: 2015-12-31 | Discharge: 2015-12-31 | Disposition: A | Discharge status: Home / Self Care | Payer: Medicare Other | Source: Ambulatory Visit | Attending: Radiation Oncology | Admitting: Radiation Oncology

## 2015-12-31 ENCOUNTER — Encounter: Payer: Self-pay | Admitting: *Deleted

## 2015-12-31 ENCOUNTER — Encounter (HOSPITAL_COMMUNITY): Payer: Medicare Other

## 2015-12-31 ENCOUNTER — Ambulatory Visit: Payer: Medicare Other | Admitting: Physical Therapy

## 2015-12-31 ENCOUNTER — Ambulatory Visit: Payer: Medicare Other | Admitting: Nutrition

## 2015-12-31 VITALS — BP 127/74 | HR 84 | Temp 97.8°F | Ht 69.0 in | Wt 140.9 lb

## 2015-12-31 DIAGNOSIS — C090 Malignant neoplasm of tonsillar fossa: Secondary | ICD-10-CM

## 2015-12-31 DIAGNOSIS — R1310 Dysphagia, unspecified: Secondary | ICD-10-CM | POA: Diagnosis not present

## 2015-12-31 DIAGNOSIS — R293 Abnormal posture: Secondary | ICD-10-CM

## 2015-12-31 DIAGNOSIS — R29898 Other symptoms and signs involving the musculoskeletal system: Secondary | ICD-10-CM | POA: Diagnosis present

## 2015-12-31 NOTE — Therapy (Signed)
[place], [place], [address] Phone: [phone]   Fax:  [phone]  Physical Therapy Evaluation  Patient Details  Name: [name] MRN: [id number] Date of Birth: Jul 07, 1930 Referring Provider: Dr. Eppie Gibson  Encounter Date: 12/31/2015      PT End of Session - 12/31/15 1115    Visit Number 1   Number of Visits 1   PT Start Time [id number]   PT Stop Time 1051   PT Time Calculation (min) 33 min   Activity Tolerance Patient tolerated treatment well   Behavior During Therapy [facility] for tasks assessed/performed      Past Medical History:  Diagnosis Date  . AAA (abdominal aortic aneurysm) ([place])   . Acute polyneuropathy related to gastric bypass surgery 2001   Quintuple bypass surgery  . Aortic valve disorder    s/p TAVR at [facility]   . Atrial fibrillation (HCC)    Chronic  . Atrial flutter ([place])   . Bell's palsy   . Bell's palsy   . Bladder cancer ([place])   . CAD (coronary artery disease)    Status post CABG, stent to diagonal prior to TAVR  . Cancer ([place])    basal cell  . Carotid stenosis   . CKD (chronic kidney disease)   . Colon cancer ([place])   . Deviated septum 1950?  . Gallstone   . Gout   . H/O laminectomy 1951   Staph Septicemia  . HTN (hypertension)   . Hypercholesteremia   . PVD (peripheral vascular disease) ([place])   . Vitamin B12 deficiency     Past Surgical History:  Procedure Laterality Date  . ANGIOPLASTY  04/2002  . AORTIC VALVE REPLACEMENT  08/2010  . APPENDECTOMY    . BLADDER SURGERY  02/2001   neo bladder replaced cancerous bladder, prostate removed  . cataract surgery Bilateral 07/25/09, 08/15/09  . COLECTOMY  07/2002   colon cancer  . COLONOSCOPY N/A 06/30/2013   Procedure: COLONOSCOPY;  Surgeon: Winfield Cunas., MD;  Location: [facility] ENDOSCOPY;  Service: Endoscopy;  Laterality: N/A;  . COLONOSCOPY WITH ESOPHAGOGASTRODUODENOSCOPY (EGD) N/A 07/03/2013   Procedure: COLONOSCOPY  ;  Surgeon: Missy Sabins, MD;  Location: [place];  Service: Endoscopy;  Laterality: N/A;  Patient is already sedated on the ventilator and may need minimal if any additional sedation.   . CORONARY ARTERY BYPASS GRAFT  02/2000  . ESOPHAGOGASTRODUODENOSCOPY N/A 06/28/2013   Procedure: ESOPHAGOGASTRODUODENOSCOPY (EGD);  Surgeon: Winfield Cunas., MD;  Location: [facility] ENDOSCOPY;  Service: Endoscopy;  Laterality: N/A;  . ESOPHAGOGASTRODUODENOSCOPY N/A 07/03/2013   Procedure: ESOPHAGOGASTRODUODENOSCOPY (EGD);  Surgeon: Missy Sabins, MD;  Location[facility] ENDOSCOPY;  Service: Endoscopy;  Laterality: N/A;  . GIVENS CAPSULE STUDY N/A 07/03/2013   Procedure: GIVENS CAPSULE STUDY;  Surgeon: Missy Sabins, MD;  Location: [place];  Service: Endoscopy;  Laterality: N/A;  . HEMICOLECTOMY    . LAMINECTOMY    . MOHS SURGERY  09/2005   nose  . TOE SURGERY      There were no vitals filed for this visit.       Subjective Assessment - 12/31/15 1057    Subjective Patient reports he was doing voluntary cardiac rehab at [facility] up until about 1-2 months ago and would like to start that again.   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 Current plan is radiation only (35 sessions). He is having his port and feeding tube placed tomorrow. He has a history of CABG in 2001, angioplasty in 2004, aortic valve replacement in 2012, and AAA.   Patient Stated Goals to obtain information of head and neck multidisciplinary clinic providers   Currently in Pain? Yes   Pain Score 4   flares up to 10   Pain Location Face   Pain Descriptors / Indicators Dull;Constant  sometimes sharp   Pain Type --  may be related to trigeminal neuralgia 50 years ago   Aggravating Factors  unknown   Pain Relieving Factors nothing            [facility] PT Assessment - 12/31/15 0001      Assessment    Medical Diagnosis left tonsillar carcinoma   Referring Provider Dr. Eppie Gibson     Precautions   Precautions Other (comment)   Precaution Comments cancer precautions     Restrictions   Weight Bearing Restrictions No     Balance Screen   Has the patient fallen in the past 6 months No   Has the patient had a decrease in activity level because of a fear of falling?  No   Is the patient reluctant to leave their home because of a fear of falling?  No     Home Ecologist residence   Living Arrangements Spouse/significant other   Available Help at Discharge Family   Type of [place] to enter   Entrance Stairs-Number of Steps about [place] Two level   Additional Comments patient reports no difficulty with stairs, he just takes increased time     Prior Function   Level of Independence Independent with household mobility with device;Other (comment)  cane for household, wheelchair in the community   Leisure cardiac rehab about 1-2 months ago, no regular exercise recently     Cognition   Overall Cognitive Status Within Functional Limits for tasks assessed     Functional Tests   Functional tests Sit to Stand     Sit to Stand   Comments able to perform 10 repetitions during 30 second sit to stand, average for his age     Posture/Postural Control   Posture Comments significant kyphosis and increased forward head     ROM / Strength   AROM / PROM / Strength AROM     AROM   Overall AROM Comments bilateral shoulder AROM WFL; cervical AROM WFL for flexion, 25% limited for left rotation and extension, 50% limited for bilateral sidebending and right rotation     Ambulation/Gait   Ambulation/Gait Yes   Ambulation/Gait Assistance 6: Modified independent (Device/Increase time)   Assistive device Straight cane   Gait Comments Ambulates with significantly increased kyphosis            LYMPHEDEMA/ONCOLOGY QUESTIONNAIRE -  12/31/15 1056      Lymphedema Assessments   Lymphedema Assessments Head and Neck     Head and Neck   4 cm superior to sternal notch around neck 36.5 cm   6 cm superior to sternal notch around neck 37.3 cm   8 cm superior to sternal notch around neck 37.9 cm                        PT Education - 12/31/15 1114    Education provided Yes   Education Details  Educated on neck AROM exercises, walking program, Cure article, lymphedema, physical therapy information brochure   Person(s) Educated Patient;Spouse   Methods Explanation;Demonstration;Handout   Comprehension Verbalized understanding                 Head and Neck Clinic Goals - 12/31/15 1124      Patient will be able to verbalize understanding of a home exercise program for cervical range of motion, posture, and walking.    Status Achieved     Patient will be able to verbalize understanding of proper sitting and standing posture.    Status Achieved     Patient will be able to verbalize understanding of lymphedema risk and availability of treatment for this condition.    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 Current plan is 35 sessions of radiation. He currently ambulates household distances using a straight cane and uses a wheelchair for community distances. He reports he uses the wheelchair if he does not know how far he will need to walk. He was able to ambulate a short distance with his straight cane without any difficulty. He demonstrates good LE functional strength and endurance during the 30 second sit to stand, as well as good bilateral UE AROM. He has some limited cervical AROM and shows a significant forward head and increased kyphotic  posture.    Rehab Potential Good   PT Treatment/Interventions ADLs/Self Care Home Management;Patient/family education   PT Next Visit Plan Currently has no therapy needs.   Consulted and Agree with Plan of Care Patient      Patient will benefit from skilled therapeutic intervention in order to improve the following deficits and impairments:  Decreased range of motion, Decreased mobility, Postural dysfunction, Pain  Visit Diagnosis: Abnormal posture  Other symptoms and signs involving the musculoskeletal system     Problem List Patient Active Problem List   Diagnosis Date Noted  . Carcinoma of tonsillar fossa ([place]) 12/11/2015  . Tongue cancer ([place]) 12/03/2015  . S/P aortic valve replacement 12/03/2015  . Adenomatous colon polyp 07/10/2013  . Lower GI hemorrhage 07/01/2013  . Shock circulatory ([place]) 07/01/2013  . Acute on chronic renal failure ([place]) 06/30/2013  . Malnutrition of moderate degree ([place]) 06/27/2013  . Upper GI bleed 06/26/2013  . Anemia, blood loss 06/26/2013  . Pain in limb 06/05/2013  . Coronary atherosclerosis of native coronary artery 02/03/2013  . CKD (chronic kidney disease) stage 3, GFR 30-59 ml/min   . Aortic valve disorder   . PVD (peripheral vascular disease) ([place])   . HTN (hypertension)   . Chronic atrial fibrillation ([place])   . AAA (abdominal aortic aneurysm) ([place])   . History of bladder cancer   . Carotid stenosis     Mellody Life, SPT 12/31/2015, 11:25 AM  [place], [place], [address] Phone: [phone]   Fax:  [phone]  Name: [name] MRN: [id number] Date of Birth: 08-27-1930  This entire session was guided, instructed, and directly supervised by Serafina Royals, PT.  Read, reviewed, edited and agree with student's findings and recommendations.   Serafina Royals, PT 12/31/15 11:38 AM

## 2015-12-31 NOTE — Progress Notes (Signed)
Pt here for patient teaching.  Pt given Radiation and You booklet, Managing Acute Radiation Side Effects for Head and Neck Cancer handout, skin care instructions and Sonafine. Pt reports they have not watched the Radiation Therapy Education video, but were given the link to watch at home.  Reviewed areas of pertinence such as fatigue, mouth changes, skin changes, throat changes and taste changes . Pt able to give teach back of to pat skin, use unscented/gentle soap and drink plenty of water,apply Sonafine bid, avoid applying anything to skin within 4 hours of treatment and to use an electric razor if they must shave. Pt verbalizes understanding of information given and will contact nursing with any questions or concerns.     Http://rtanswers.org/treatmentinformation/whattoexpect/index  Managing Acute Radiation Side Effects for Head and Neck Cancer  Skin irritation:  . Sonafine  Topical Emulsion: First-line topical cream to help soothe skin irritation.  Apply to skin in radiation fields at least 4 hours before radiotherapy, or any time after treatments during the rest of the day.  . Triple Antibiotic Ointment (Neosporin): Apply to areas of skin with moist breakdown to prevent infection.  . 1% hydrocortisone cream: Apply to areas of skin that are itching, up to three times a day.  Arnetha Massy (Silver Sulfadiazine): Used in select cases if large patches of skin develop moist breakdown (let physician or nurse know if you have a "sulfa" drug allergy)  Soreness in mouth or throat: . Baking Soda Rinse: a home remedy to soothe/cleanse mouth and loosen thick saliva.  Mix 1/2 teaspoon salt, 1/2 teaspoon baking soda, 1 pint water (16 oz or two cups).  Swish, gargle and spit as needed to soothe/cleanse mouth. Use as often as you want.  . Sucralfate (Carafate): coats throat to soothe it before meals or any time of day. Crush 1 tablet in 10 mL H20 and swallow up to four times a day.  . 2% viscous Lidocaine  (Magic Mouth Wash): Soothes mouth and/or throat by numbing your mucous membranes. Mix 1 part 2% viscous lidocaine (Magic Mouth Wash), 1 part H20. Swish and/or swallow 51mL of this mixture, 11min before meals and at bedtime, up to four times a day. Alternate with Sucralfate (Carafate).  . Narcotics: Various short acting and long acting narcotics can be prescribed.  Often, medical oncology will prescribe these if you are receiving chemotherapy concurrently. Narcotics may cause constipation. It may be helpful to take a stool softener (Docusate Sodium) or gentle laxative (ie Senna or Polyethylene Glycol) to prevent constipation.  Having food in your stomach before ingesting a narcotic may reduce risk of stomach upset.  Thick Saliva: . Baking Soda Rinse: a home remedy to soothe/cleanse mouth and loosen thick saliva.  Mix 1/2 teaspoon salt, 1/2 teaspoon baking soda, 1 pint water (16 oz or two cups).  Swish, gargle, and spit as needed to soothe/cleanse mouth. Use as often as you want.  . Some patients find Diet Ginger Ale or Papaya Juice to be helpful.  . In extreme cases, your physician may consider prescribing a Scopolamine transdermal patch which dries up your saliva.     Poor taste, or lack of taste:   . There are no well-established medications to combat taste bud changes from radiotherapy.  It often takes weeks to months to regain taste function.  Eating bland foods and drinking nutritional shakes  may help you maintain your weight when food is not enjoyable.  Some patients supplement their oral intake with a feeding tube.  Fatigue  and weakness: . There is not a well-established safe and effective medication to combat radiation-induced fatigue.  However, if you are able to perform light exercise (such as a daily walk, yoga, recumbent stationary bicycling), this may combat fatigue and help you maintain muscle mass during treatment.  . Maintaining hydration and nutrition are also important.  If you  have not been referred to a nutritionist and would like a referral, please let your nurse or physician know.  . Try to get at least 8 hours of sleep each night. You may need a daily nap, but try not to nap so late that it interferes with your nightly sleep schedule.

## 2015-12-31 NOTE — Progress Notes (Signed)
Patient was seen in head and neck clinic.  82 year old male diagnosed with tonsil cancer receiving radiation therapy only.  He is a patient of Dr. Isidore Moos.  Past medical history includes DDD, hypercholesterolemia, B12 deficiency, hypertension, gout, chronic kidney disease, colon cancer, CAD, bladder cancer, Bell's palsy, atrial fibrillation, and AAA.  Medications include Eliquis, Lipitor, vitamin B12, Colace, Lasix.  Labs were reviewed.  Height: 69 inches. Weight: 140.9 pounds on September 12. Usual body weight: 170 pounds one year ago; 154 pounds March 2017. BMI: 20.81.  Patient's wife very concerned with patient's weight loss. He has a very poor appetite with poor oral intake. Patient reports he does not tolerate meat anymore and he needs a soft diet. Patient reports he tried oral nutrition supplements but does not like them. Patient is scheduled for feeding tube placement this week.  Estimated nutrition needs: 1850-2150 calories, 90-100 grams protein, 2.2 L fluid.  Nutrition diagnosis:  Malnutrition related to 17% weight loss over one year and depletion of muscle mass and body fat.  Intervention: Patient was educated to consume high-calorie, high-protein foods in 6 small meals and snacks every day. Encouraged patient to try Carnation breakfast essentials with whole milk and powdered milk and provided recipes and samples. Provided information on soft diet and encourage patient's wife to blenderize meats and add sauces and liquids for better tolerance. Provided samples of boost Breeze. Brief education provided on feeding tube nutrition. Questions were answered.  Teach back method used.  Contact information provided.  Monitoring, evaluation, goals:  Patient will tolerate increased calories and protein to promote healing and repletion  Next visit: To be scheduled.  **Disclaimer: This note was dictated with voice recognition software. Similar sounding words can inadvertently be  transcribed and this note may contain transcription errors which may not have been corrected upon publication of note.**

## 2015-12-31 NOTE — Therapy (Signed)
[facility] [address] [place], [place], [address] Phone: [phone]   Fax:  [phone]  Speech Language Pathology Evaluation  Patient Details  Name: [name] MRN: [id number] Date of Birth: [birth year] Referring Provider: Eppie Gibson M.D.  Encounter Date: 12/31/2015      End of Session - 12/31/15 1140    Visit Number 1  eval 12-31-15   Number of Visits 3   Date for SLP Re-Evaluation 03/20/16   SLP Start Time 41   SLP Stop Time  1130   SLP Time Calculation (min) 40 min   Activity Tolerance Patient tolerated treatment well      Past Medical History:  Diagnosis Date  . AAA (abdominal aortic aneurysm) ([place])   . Acute polyneuropathy related to gastric bypass surgery 2001   Quintuple bypass surgery  . Aortic valve disorder    s/p TAVR at [facility]   . Atrial fibrillation (HCC)    Chronic  . Atrial flutter ([place])   . Bell's palsy   . Bell's palsy   . Bladder cancer ([place])   . CAD (coronary artery disease)    Status post CABG, stent to diagonal prior to TAVR  . Cancer ([place])    basal cell  . Carotid stenosis   . CKD (chronic kidney disease)   . Colon cancer ([place])   . Deviated septum 1950?  . Gallstone   . Gout   . H/O laminectomy 1951   Staph Septicemia  . HTN (hypertension)   . Hypercholesteremia   . PVD (peripheral vascular disease) ([place])   . Vitamin B12 deficiency     Past Surgical History:  Procedure Laterality Date  . ANGIOPLASTY  04/2002  . AORTIC VALVE REPLACEMENT  08/2010  . APPENDECTOMY    . BLADDER SURGERY  02/2001   neo bladder replaced cancerous bladder, prostate removed  . cataract surgery Bilateral 07/25/09, 08/15/09  . COLECTOMY  07/2002   colon cancer  . COLONOSCOPY N/A 06/30/2013   Procedure: COLONOSCOPY;  Surgeon: Winfield Cunas., MD;  Location: [facility] ENDOSCOPY;  Service: Endoscopy;  Laterality: N/A;  . COLONOSCOPY WITH ESOPHAGOGASTRODUODENOSCOPY (EGD) N/A 07/03/2013   Procedure:  COLONOSCOPY ;  Surgeon: Missy Sabins, MD;  Location: [facility];  Service: Endoscopy;  Laterality: N/A;  Patient is already sedated on the ventilator and may need minimal if any additional sedation.   . CORONARY ARTERY BYPASS GRAFT  02/2000  . ESOPHAGOGASTRODUODENOSCOPY N/A 06/28/2013   Procedure: ESOPHAGOGASTRODUODENOSCOPY (EGD);  Surgeon: Winfield Cunas., MD;  Location: [facility] ENDOSCOPY;  Service: Endoscopy;  Laterality: N/A;  . ESOPHAGOGASTRODUODENOSCOPY N/A 07/03/2013   Procedure: ESOPHAGOGASTRODUODENOSCOPY (EGD);  Surgeon: Missy Sabins, MD;  [facility];  Service: Endoscopy;  Laterality: N/A;  . GIVENS CAPSULE STUDY N/A 07/03/2013   Procedure: GIVENS CAPSULE STUDY;  Surgeon: Missy Sabins, MD;  Location: [facility];  Service: Endoscopy;  Laterality: N/A;  . HEMICOLECTOMY    . LAMINECTOMY    . MOHS SURGERY  09/2005   nose  . TOE SURGERY      There were no vitals filed for this visit.      Subjective Assessment - 12/31/15 1104    Subjective Pt with Bell's Palsy approx 2 months ago, not completely resolved.   Patient is accompained by: Family member   Currently in Pain? Yes   Pain Score 4    Pain Location Face   Pain Orientation Left   Pain Descriptors / Indicators Dull  Pain Type Chronic pain   Pain Radiating Towards ears   Pain Onset More than a month ago   Pain Frequency Constant   Aggravating Factors  unknown   Pain Relieving Factors unknown            SLP Evaluation [facility] - 12/31/15 1104      SLP Visit Information   SLP Received On 12/31/15   Referring Provider Eppie Gibson M.D.   Medical Diagnosis Cancer of Tons[place] Fossa     General Information   HPI Pt with Bell's Palsy      Prior Functional Status   Cognitive/Linguistic Baseline Within functional limits     Cognition   Overall Cognitive Status Within Functional Limits for tasks assessed     Auditory Comprehension   Overall Auditory Comprehension Appears within functional limits for tasks  assessed     Verbal Expression   Overall Verbal Expression Appears within functional limits for tasks assessed     Oral Motor/Sensory Function   Overall Oral Motor/Sensory Function Impaired at baseline   Labial ROM Reduced left   Labial Symmetry Abnormal symmetry left   Labial Strength Reduced   Labial Coordination Reduced   Lingual ROM Reduced left   Lingual Symmetry Abnormal symmetry left   Lingual Strength Reduced  slight on rt, moreso on lt   Lingual Coordination Reduced   Velum Impaired left  slight reduced      Motor Speech   Overall Motor Speech Impaired   Articulation Impaired  very mild   Level of Impairment Sentence   Intelligibility Intelligible      Pt currently tolerates soft diet with thin liquids. He has self-modified his diet at the same time as his Bell's Palsy to include a soft diet. POs: Pt ate piece of [place] and drank water without overt s/s aspiration. Thyroid elevation appeared adequate, and swallows appeared timely. Oral residue noted as minimal. Pt's swallow deemed WFL at this time. Pt c/o excessive saliva and SLP suggested pt chew gum to alleviate some of the difficulty.   Because data states the risk for dysphagia during and after radiation treatment is high due to undergoing radiation tx, SLP taught pt about the possibility of reduced/limited ability for PO intake during rad tx. SLP encouraged pt to continue swallowing POs as far into rad tx as possible, even ingesting POs and/or completing HEP shortly after administration of pain meds.   SLP educated pt re: changes to swallowing musculature after rad tx, and why adherence to dysphagia HEP provided today was necessary to reduce muscle fibrosis following rad tx. Pt and wife demonstrated understanding of these things to SLP.   SLP then developed a HEP for pt and pt was instructed how to perform exercises involving lingual, vocal, and pharyngeal strengthening. SLP performed each exercise and pt return  demonstrated each exercise. SLP ensured pt performance was correct prior to moving on to next exercise. Pt was instructed to complete this program 2-3 times a day  until 6 months after his last rad tx, then x2 a week after that.                     SLP Education - 12/31/15 1139    Education provided Yes   Education Details dysphagia HEP, late effects head/neck radiation   Person(s) Educated Patient;Spouse   Methods Explanation;Demonstration;Verbal cues;Handout   Comprehension Verbalized understanding;Returned demonstration;Verbal cues required;Need further instruction          SLP Short Term Goals -  01-11-16 [place] #1   Title pt will tell SLP why he is completing HEP with modified independence   Time 1   Period --  therapy visit   Status New     SLP SHORT TERM GOAL #2   Title pt will complete HEP correctly with rare min A   Time 1   Period --  therapy visit   Status New     SLP SHORT TERM GOAL #3   Title pt will demo knowledge of how long he needs to perform HEP at 2-3 times per day, with occasional min A   Time 1   Period --  therapy visit   Status New          SLP Long Term Goals - 01-11-2016 1151      SLP LONG TERM GOAL #1   Title pt will demo knowledge of how long to perform HEP at 2-3 times per day, with rare min A   Time 2   Period --  therapy visits   Status New     SLP LONG TERM GOAL #2   Title pt will demo HEP for dysphagia with modified indpendence   Time 2   Period --  therapy visits   Status New     SLP LONG TERM GOAL #3   Title pt will tell SLP how a food journal can assist return to PO diet   Time 2   Period --  therapy visits   Status New          Plan - 01-11-2016 1141    Clinical Impression Statement Pt presents today with swallowing WFL (but not WNL) as pt has self modified his diet since his Bell's Palsy in late May 2017. He takes smaller bites of softer foods due to reported pain while chewing.  Skilled ST is necessary at this time to assess for proper HEP completion as well as to assess for safety with PO intake.   Speech Therapy Frequency --  once approx every four weeks   Duration --  3 total visits in <90 days   Treatment/Interventions Aspiration precaution training;Pharyngeal strengthening exercises;Trials of upgraded texture/liquids;Compensatory strategies;Patient/family education;Diet toleration management by SLP;SLP instruction and feedback  any or all might be used in therapy sessions   Potential to Achieve Goals Good      Patient will benefit from skilled therapeutic intervention in order to improve the following deficits and impairments:   Dysphagia      G-Codes - 01-11-16 1153    Functional Assessment Tool Used NOMS- (25-30% impaired)   Functional Limitations Swallowing   Swallow Current Status [id number]) At least 20 percent but less than 40 percent impaired, limited or restricted   Swallow Goal Status [id number]) At least 1 percent but less than 20 percent impaired, limited or restricted      Problem List Patient Active Problem List   Diagnosis Date Noted  . Carcinoma of tonsillar fossa ([place]) 12/11/2015  . Tongue cancer ([place]) 12/03/2015  . S/P aortic valve replacement 12/03/2015  . Adenomatous colon polyp 07/10/2013  . Lower GI hemorrhage 07/01/2013  . Shock circulatory ([place]) 07/01/2013  . Acute on chronic renal failure ([place]) 06/30/2013  . Malnutrition of moderate degree ([place]) 06/27/2013  . Upper GI bleed 06/26/2013  . Anemia, blood loss 06/26/2013  . Pain in limb 06/05/2013  . Coronary atherosclerosis of native coronary artery 02/03/2013  . CKD (chronic kidney disease) stage 3, GFR  30-59 ml/min   . Aortic valve disorder   . PVD (peripheral vascular disease) ([place])   . HTN (hypertension)   . Chronic atrial fibrillation ([place])   . AAA (abdominal aortic aneurysm) ([place])   . History of bladder cancer   . Carotid stenosis     Rhianon Zabawa ,MS,  CCC-SLP  12/31/2015, 11:54 AM  [facility] [address] [place], [place], [address] Phone: [phone]   Fax:  [phone]  Name: LAUDIE STEININGER MRN: [id number] Date of Birth: 02/18/1931

## 2015-12-31 NOTE — Progress Notes (Signed)
Head & Neck Multidisciplinary Clinic Clinical Social Work  Clinical Social Work met with patient/family at head & neck multidisciplinary clinic to offer support and assess for psychosocial needs.  Patient was accompanied by his spouse.  Patient reports he is currently inactive and worries the treatment will be very cause him to have even more difficulty with an active lifestyle.  Mr. Casillas shared he has been married for 55 years and his spouse is extremely supportive.  CSW discussed common emotional responses to cancer treatment and explored possible coping skills/support systems for patient.  Clinical Social Work briefly discussed Clinical Social Work role and [employer] support programs/services.  Clinical Social Work encouraged patient to call with any additional questions or concerns.   Polo Riley, MSW, LCSW, OSW-C Clinical Social Worker [facility] [phone]

## 2015-12-31 NOTE — Patient Instructions (Signed)
SWALLOWING EXERCISES Do these until 6 months after your last day of radiation, then 3 times per week afterwards  1. Effortful Swallows - Press your tongue against the roof of your mouth for 3 seconds, then squeeze          the muscles in your neck while you swallow your saliva or a sip of water - Repeat 15-20 times, 2-3 times a day, and use whenever you eat or drink  2. [place] Swallow - swallow with your tongue sticking out - Stick tongue out past your teeth and gently bite tongue with your teeth - Swallow, while holding your tongue with your teeth - Repeat 20 times, 2-3 times a day *use a wet spoon if your mouth gets dry*  3. Pitch Raise - Repeat "he", once per second in as high of a pitch as you can - Repeat 20 times, 2-3 times a day  4. Mendelsohn Maneuver - "half swallow" exercise - Start to swallow, and keep your Adam's apple up by squeezing hard with the            muscles of the throat - Hold the squeeze for 5-7 seconds and then relax - Repeat 20 times, 2-3 times a day *use a wet spoon if your mouth gets dry*  5. Tongue Stretch/Teeth Clean - Move your tongue around the pocket between your gums and teeth, clockwise and then counter-clockwise - Repeat on the back side, clockwise and then counter-clockwise (optional) - Repeat 15-20 times, 2-3 times a day  6. Breath Hold - Say "HUH!" loudly, then hold your breath for 3 seconds at your voice box - Repeat 20 times, 2-3 times a day  7. Chin pushback - Open your mouth  - Place your fist UNDER your chin near your neck, and push back with your fist for 5 seconds - Repeat 10 times, 2-3 times a day

## 2016-01-01 ENCOUNTER — Ambulatory Visit: Payer: Medicare Other

## 2016-01-01 ENCOUNTER — Encounter: Payer: Self-pay | Admitting: *Deleted

## 2016-01-01 ENCOUNTER — Encounter (HOSPITAL_COMMUNITY): Payer: Medicare Other

## 2016-01-01 ENCOUNTER — Ambulatory Visit (HOSPITAL_COMMUNITY)
Admission: RE | Admit: 2016-01-01 | Discharge: 2016-01-01 | Disposition: A | Discharge status: Home / Self Care | Payer: Medicare Other | Source: Ambulatory Visit | Attending: Radiation Oncology | Admitting: Radiation Oncology

## 2016-01-01 ENCOUNTER — Ambulatory Visit (HOSPITAL_COMMUNITY)
Admission: RE | Admit: 2016-01-01 | Discharge: 2016-01-01 | Disposition: A | Discharge status: Home / Self Care | Payer: Medicare Other | Source: Ambulatory Visit | Attending: Hematology and Oncology | Admitting: Hematology and Oncology

## 2016-01-01 ENCOUNTER — Other Ambulatory Visit: Payer: Self-pay | Admitting: Hematology and Oncology

## 2016-01-01 ENCOUNTER — Encounter (HOSPITAL_COMMUNITY): Payer: Self-pay

## 2016-01-01 DIAGNOSIS — C029 Malignant neoplasm of tongue, unspecified: Secondary | ICD-10-CM

## 2016-01-01 DIAGNOSIS — I482 Chronic atrial fibrillation: Secondary | ICD-10-CM | POA: Diagnosis not present

## 2016-01-01 DIAGNOSIS — I714 Abdominal aortic aneurysm, without rupture: Secondary | ICD-10-CM | POA: Insufficient documentation

## 2016-01-01 DIAGNOSIS — M109 Gout, unspecified: Secondary | ICD-10-CM | POA: Insufficient documentation

## 2016-01-01 DIAGNOSIS — C090 Malignant neoplasm of tonsillar fossa: Secondary | ICD-10-CM

## 2016-01-01 DIAGNOSIS — I4892 Unspecified atrial flutter: Secondary | ICD-10-CM | POA: Insufficient documentation

## 2016-01-01 DIAGNOSIS — N189 Chronic kidney disease, unspecified: Secondary | ICD-10-CM | POA: Insufficient documentation

## 2016-01-01 DIAGNOSIS — I6529 Occlusion and stenosis of unspecified carotid artery: Secondary | ICD-10-CM | POA: Diagnosis not present

## 2016-01-01 DIAGNOSIS — I739 Peripheral vascular disease, unspecified: Secondary | ICD-10-CM | POA: Insufficient documentation

## 2016-01-01 DIAGNOSIS — I358 Other nonrheumatic aortic valve disorders: Secondary | ICD-10-CM | POA: Diagnosis not present

## 2016-01-01 DIAGNOSIS — Z951 Presence of aortocoronary bypass graft: Secondary | ICD-10-CM | POA: Diagnosis not present

## 2016-01-01 DIAGNOSIS — E538 Deficiency of other specified B group vitamins: Secondary | ICD-10-CM | POA: Insufficient documentation

## 2016-01-01 DIAGNOSIS — Z7901 Long term (current) use of anticoagulants: Secondary | ICD-10-CM | POA: Insufficient documentation

## 2016-01-01 DIAGNOSIS — I2510 Atherosclerotic heart disease of native coronary artery without angina pectoris: Secondary | ICD-10-CM | POA: Diagnosis not present

## 2016-01-01 DIAGNOSIS — I129 Hypertensive chronic kidney disease with stage 1 through stage 4 chronic kidney disease, or unspecified chronic kidney disease: Secondary | ICD-10-CM | POA: Diagnosis not present

## 2016-01-01 DIAGNOSIS — C760 Malignant neoplasm of head, face and neck: Secondary | ICD-10-CM | POA: Diagnosis not present

## 2016-01-01 DIAGNOSIS — Z510 Encounter for antineoplastic radiation therapy: Secondary | ICD-10-CM | POA: Diagnosis not present

## 2016-01-01 DIAGNOSIS — E7800 Pure hypercholesterolemia, unspecified: Secondary | ICD-10-CM | POA: Insufficient documentation

## 2016-01-01 HISTORY — PX: IR GENERIC HISTORICAL: IMG1180011

## 2016-01-01 LAB — CBC WITH DIFFERENTIAL/PLATELET
BASOS ABS: 0 10*3/uL (ref 0.0–0.1)
BASOS PCT: 1 %
EOS ABS: 0.3 10*3/uL (ref 0.0–0.7)
EOS PCT: 5 %
HEMATOCRIT: 41.3 % (ref 39.0–52.0)
Hemoglobin: 13.8 g/dL (ref 13.0–17.0)
Lymphocytes Relative: 20 %
Lymphs Abs: 1.2 10*3/uL (ref 0.7–4.0)
MCH: 33.4 pg (ref 26.0–34.0)
MCHC: 33.4 g/dL (ref 30.0–36.0)
MCV: 100 fL (ref 78.0–100.0)
MONO ABS: 0.8 10*3/uL (ref 0.1–1.0)
Monocytes Relative: 14 %
NEUTROS ABS: 3.8 10*3/uL (ref 1.7–7.7)
Neutrophils Relative %: 62 %
PLATELETS: 205 10*3/uL (ref 150–400)
RBC: 4.13 MIL/uL — ABNORMAL LOW (ref 4.22–5.81)
RDW: 14.5 % (ref 11.5–15.5)
WBC: 6.2 10*3/uL (ref 4.0–10.5)

## 2016-01-01 LAB — BASIC METABOLIC PANEL
ANION GAP: 8 (ref 5–15)
BUN: 31 mg/dL — ABNORMAL HIGH (ref 6–20)
CALCIUM: 9.5 mg/dL (ref 8.9–10.3)
CO2: 26 mmol/L (ref 22–32)
CREATININE: 1.91 mg/dL — AB (ref 0.61–1.24)
Chloride: 102 mmol/L (ref 101–111)
GFR calc Af Amer: 36 mL/min — ABNORMAL LOW (ref >60)
GFR, EST NON AFRICAN AMERICAN: 31 mL/min — AB (ref >60)
GLUCOSE: 94 mg/dL (ref 65–99)
Potassium: 4.1 mmol/L (ref 3.5–5.1)
Sodium: 136 mmol/L (ref 135–145)

## 2016-01-01 LAB — PROTIME-INR
INR: 0.99
PROTHROMBIN TIME: 13.1 s (ref 11.4–15.2)

## 2016-01-01 LAB — APTT: APTT: 34 s (ref 24–36)

## 2016-01-01 MED ORDER — HYDROCODONE-ACETAMINOPHEN 5-325 MG PO TABS: 1.0000 | ORAL_TABLET | ORAL | Status: DC | PRN

## 2016-01-01 MED ORDER — LIDOCAINE-EPINEPHRINE (PF) 2 %-1:200000 IJ SOLN
INTRAMUSCULAR | Status: AC
  Filled 2016-01-01: qty 20

## 2016-01-01 MED ORDER — MIDAZOLAM HCL 2 MG/2ML IJ SOLN
INTRAMUSCULAR | Status: AC | PRN
  Administered 2016-01-01: 1 mg via INTRAVENOUS

## 2016-01-01 MED ORDER — HYDROMORPHONE HCL 1 MG/ML IJ SOLN: 1.0000 mg | INTRAMUSCULAR | Status: DC | PRN

## 2016-01-01 MED ORDER — LIDOCAINE HCL 1 % IJ SOLN
INTRAMUSCULAR | Status: AC
  Filled 2016-01-01: qty 20

## 2016-01-01 MED ORDER — GLUCAGON HCL RDNA (DIAGNOSTIC) 1 MG IJ SOLR
INTRAMUSCULAR | Status: AC
  Administered 2016-01-01: 1 mg
  Filled 2016-01-01: qty 1

## 2016-01-01 MED ORDER — FENTANYL CITRATE (PF) 100 MCG/2ML IJ SOLN
INTRAMUSCULAR | Status: AC | PRN
  Administered 2016-01-01: 25 ug via INTRAVENOUS

## 2016-01-01 MED ORDER — SODIUM CHLORIDE 0.9 % IV SOLN
INTRAVENOUS | Status: DC
  Administered 2016-01-01: 08:00:00 via INTRAVENOUS

## 2016-01-01 MED ORDER — MIDAZOLAM HCL 2 MG/2ML IJ SOLN
INTRAMUSCULAR | Status: AC
  Filled 2016-01-01: qty 4

## 2016-01-01 MED ORDER — ONDANSETRON HCL 4 MG/2ML IJ SOLN: 4.0000 mg | INTRAMUSCULAR | Status: DC | PRN

## 2016-01-01 MED ORDER — CEFAZOLIN SODIUM-DEXTROSE 2-4 GM/100ML-% IV SOLN
2.0000 g | INTRAVENOUS | Status: AC
  Administered 2016-01-01: 2 g via INTRAVENOUS
  Filled 2016-01-01: qty 100

## 2016-01-01 MED ORDER — IOPAMIDOL (ISOVUE-300) INJECTION 61%
50.0000 mL | Freq: Once | INTRAVENOUS | Status: AC | PRN
  Administered 2016-01-01: 10 mL via INTRAVENOUS

## 2016-01-01 MED ORDER — FENTANYL CITRATE (PF) 100 MCG/2ML IJ SOLN
INTRAMUSCULAR | Status: AC
  Filled 2016-01-01: qty 4

## 2016-01-01 MED ORDER — FENTANYL CITRATE (PF) 100 MCG/2ML IJ SOLN
INTRAMUSCULAR | Status: AC | PRN
  Administered 2016-01-01: 50 ug via INTRAVENOUS

## 2016-01-01 MED ORDER — MIDAZOLAM HCL 2 MG/2ML IJ SOLN
INTRAMUSCULAR | Status: AC | PRN
  Administered 2016-01-01 (×2): 0.5 mg via INTRAVENOUS

## 2016-01-01 MED ORDER — HEPARIN SOD (PORK) LOCK FLUSH 100 UNIT/ML IV SOLN
INTRAVENOUS | Status: AC
  Administered 2016-01-01: 500 [IU]
  Filled 2016-01-01: qty 5

## 2016-01-01 MED FILL — OXYCODONE/APAP 5/325MG: 5-325 | 2 days supply | Qty: 10 | Fill #0

## 2016-01-01 NOTE — Procedures (Signed)
Interventional Radiology Procedure Note  Procedure:  1.) Placement of a right IJ approach single lumen PowerPort.  Tip is positioned at the superior cavoatrial junction and catheter is ready for immediate use.  2.) Placement of percutaneous 20F pull-through gastrostomy tube. Complications: No immediate Recommendations:  - Routine line care  - NPO except for sips and chips remainder of today and overnight - Maintain G-tube to LWS until tomorrow morning  - May advance diet as tolerated and begin using tube tomorrow morning Signed,  Maricia Scotti K. Shahrukh Pasch, MD   

## 2016-01-01 NOTE — Discharge Instructions (Addendum)
Gastrostomy Tube Replacement, Care After Refer to this sheet in the next few weeks. These instructions provide you with information on caring for yourself after your procedure. Your health care provider may also give you more specific instructions. Your treatment has been planned according to current medical practices, but problems sometimes occur. Call your health care provider if you have any problems or questions after your procedure. WHAT TO EXPECT AFTER THE PROCEDURE  After your procedure, it is typical to have the following:   Mild abdominal pain.  A small amount of blood-tinged fluid leaking from the replacement site. HOME CARE INSTRUCTIONS  You may resume your normal level of activity.  You may resume your normal feedings.  Care for your gastrostomy tube as you did before, or as directed by your health care provider. SEEK MEDICAL CARE IF:  You have a fever or chills.  You have redness or irritation near the insertion site.  You continue to have abdominal pain or leaking around your gastrostomy tube. SEEK IMMEDIATE MEDICAL CARE IF:   You develop bleeding or significant discharge around the tube.  You have severe abdominal pain.  Your new tube does not seem to be working properly.  You are unable to get feedings into the tube.  Your tube comes out for any reason.    This information is not intended to replace advice given to you by your health care provider. Make sure you discuss any questions you have with your health care provider.   Document Released: 11/01/2013 Document Reviewed: 11/01/2013 Elsevier Interactive Patient Education 2016 [place].  Moderate Conscious Sedation, Adult, Care After Refer to this sheet in the next few weeks. These instructions provide you with information on caring for yourself after your procedure. Your health care provider may also give you more specific instructions. Your treatment has been planned according to current medical  practices, but problems sometimes occur. Call your health care provider if you have any problems or questions after your procedure. WHAT TO EXPECT AFTER THE PROCEDURE  After your procedure:  You may feel sleepy, clumsy, and have poor balance for several hours.  Vomiting may occur if you eat too soon after the procedure. HOME CARE INSTRUCTIONS  Do not participate in any activities where you could become injured for at least 24 hours. Do not:  Drive.  Swim.  Ride a bicycle.  Operate heavy machinery.  Cook.  Use power tools.  Climb ladders.  Work from a high place.  Do not make important decisions or sign legal documents until you are improved.  If you vomit, drink water, juice, or soup when you can drink without vomiting. Make sure you have little or no nausea before eating solid foods.  Only take over-the-counter or prescription medicines for pain, discomfort, or fever as directed by your health care provider.  Make sure you and your family fully understand everything about the medicines given to you, including what side effects may occur.  You should not drink alcohol, take sleeping pills, or take medicines that cause drowsiness for at least 24 hours.  If you smoke, do not smoke without supervision.  If you are feeling better, you may resume normal activities 24 hours after you were sedated.  Keep all appointments with your health care provider. SEEK MEDICAL CARE IF:  Your skin is pale or bluish in color.  You continue to feel nauseous or vomit.  Your pain is getting worse and is not helped by medicine.  You have bleeding or swelling.  You are still sleepy or feeling clumsy after 24 hours. SEEK IMMEDIATE MEDICAL CARE IF:  You develop a rash.  You have difficulty breathing.  You develop any type of allergic problem.  You have a fever. MAKE SURE YOU:  Understand these instructions.  Will watch your condition.  Will get help right away if you are not  doing well or get worse.   This information is not intended to replace advice given to you by your health care provider. Make sure you discuss any questions you have with your health care provider.   Document Released: 01/25/2013 Document Revised: 04/27/2014 Document Reviewed: 01/25/2013 Elsevier Interactive Patient Education 2016 [place] Insertion, Care After Refer to this sheet in the next few weeks. These instructions provide you with information on caring for yourself after your procedure. Your health care provider may also give you more specific instructions. Your treatment has been planned according to current medical practices, but problems sometimes occur. Call your health care provider if you have any problems or questions after your procedure. WHAT TO EXPECT AFTER THE PROCEDURE After your procedure, it is typical to have the following:   Discomfort at the port insertion site. Ice packs to the area will help.  Bruising on the skin over the port. This will subside in 3-4 days. HOME CARE INSTRUCTIONS  After your port is placed, you will get a manufacturer's information card. The card has information about your port. Keep this card with you at all times.   Know what kind of port you have. There are many types of ports available.   Wear a medical alert bracelet in case of an emergency. This can help alert health care workers that you have a port.   The port can stay in for as long as your health care provider believes it is necessary.   A home health care nurse may give medicines and take care of the port.   You or a family member can get special training and directions for giving medicine and taking care of the port at home.  SEEK MEDICAL CARE IF:   Your port does not flush or you are unable to get a blood return.   You have a fever or chills. SEEK IMMEDIATE MEDICAL CARE IF:  You have new fluid or pus coming from your incision.   You notice a  bad smell coming from your incision site.   You have swelling, pain, or more redness at the incision or port site.   You have chest pain or shortness of breath.   This information is not intended to replace advice given to you by your health care provider. Make sure you discuss any questions you have with your health care provider.   Document Released: 01/25/2013 Document Revised: 04/11/2013 Document Reviewed: 01/25/2013 Elsevier Interactive Patient Education [employer].

## 2016-01-01 NOTE — Progress Notes (Signed)
Patient ID: [name], male   DOB: 1930/11/17, 82 y.o.   MRN: [id number] Patient given prescription for Percocet 5/325, #10, no refills, 1-2 tablets every 4-6 hours as needed for moderate to severe pain status post gastrostomy tube placement

## 2016-01-01 NOTE — Progress Notes (Signed)
Referring Physician(s): Squire,Sarah/Gorsuch,N  Supervising Physician: Jacqulynn Cadet  Patient Status:  Outpatient  Chief Complaint: Tongue cancer   Subjective: Patient familiar to IR service from prior visceral arteriogram in 2016 secondary to lower GI bleed. 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 He presents again today for both Port-A-Cath and percutaneous gastrostomy tube placements prior to planned radiation therapy. He currently denies fever, chest pain, dyspnea, abdominal/back pain, nausea, vomiting or abnormal bleeding. He has had occasional headaches, dysphagia, weight loss, as well as left neck/facial pain. Additional history as below. Past Medical History:  Diagnosis Date  . AAA (abdominal aortic aneurysm) ([place])   . Acute polyneuropathy related to gastric bypass surgery 2001   Quintuple bypass surgery  . Aortic valve disorder    s/p TAVR at [facility]   . Atrial fibrillation (HCC)    Chronic  . Atrial flutter ([place])   . Bell's palsy   . Bell's palsy   . Bladder cancer ([place])   . CAD (coronary artery disease)    Status post CABG, stent to diagonal prior to TAVR  . Cancer ([place])    basal cell  . Carotid stenosis   . CKD (chronic kidney disease)   . Colon cancer ([place])   . Deviated septum 1950?  . Gallstone   . Gout   . H/O laminectomy 1951   Staph Septicemia  . HTN (hypertension)   . Hypercholesteremia   . PVD (peripheral vascular disease) ([place])   . Vitamin B12 deficiency    Past Surgical History:  Procedure Laterality Date  . ANGIOPLASTY  04/2002  . AORTIC VALVE REPLACEMENT  08/2010  . APPENDECTOMY    . BLADDER SURGERY  02/2001   neo bladder replaced cancerous bladder, prostate removed  . cataract surgery Bilateral 07/25/09, 08/15/09  . COLECTOMY  07/2002   colon cancer  . COLONOSCOPY N/A 06/30/2013   Procedure: COLONOSCOPY;  Surgeon: Winfield Cunas., MD;  Location: [facility] [place] ENDOSCOPY;  Service:  Endoscopy;  Laterality: N/A;  . COLONOSCOPY WITH ESOPHAGOGASTRODUODENOSCOPY (EGD) N/A 07/03/2013   Procedure: COLONOSCOPY ;  Surgeon: Missy Sabins, MD;  Location: [place];  Service: Endoscopy;  Laterality: N/A;  Patient is already sedated on the ventilator and may need minimal if any additional sedation.   . CORONARY ARTERY BYPASS GRAFT  02/2000  . ESOPHAGOGASTRODUODENOSCOPY N/A 06/28/2013   Procedure: ESOPHAGOGASTRODUODENOSCOPY (EGD);  Surgeon: Winfield Cunas., MD;  Location: [facility] ENDOSCOPY;  Service: Endoscopy;  Laterality: N/A;  . ESOPHAGOGASTRODUODENOSCOPY N/A 07/03/2013   Procedure: ESOPHAGOGASTRODUODENOSCOPY (EGD);  Surgeon: Missy Sabins, MD;  Location[facility] ENDOSCOPY;  Service: Endoscopy;  Laterality: N/A;  . GIVENS CAPSULE STUDY N/A 07/03/2013   Procedure: GIVENS CAPSULE STUDY;  Surgeon: Missy Sabins, MD;  Location: [place];  Service: Endoscopy;  Laterality: N/A;  . HEMICOLECTOMY    . LAMINECTOMY    . MOHS SURGERY  09/2005   nose  . TOE SURGERY       Allergies: Sulfa antibiotics and Ramipril  Medications: Prior to Admission medications   Medication Sig Start Date End Date Taking? Authorizing Provider  acetaminophen (TYLENOL) 500 MG tablet Take 500 mg by mouth every 6 (six) hours as needed.    Historical Provider, MD  allopurinol (ZYLOPRIM) 100 MG tablet Take 100 mg by mouth 2 (two) times daily.  09/06/14   Historical Provider, MD  amLODipine (NORVASC) 2.5 MG tablet TAKE 1 TABLET (2.5 MG TOTAL) BY MOUTH DAILY. 12/17/15  Jettie Booze, MD  apixaban (ELIQUIS) 2.5 MG TABS tablet Take 1 tablet (2.5 mg total) by mouth 2 (two) times daily. 01/18/15   Jettie Booze, MD  atorvastatin (LIPITOR) 40 MG tablet Take 40 mg by mouth daily at 6 PM.  11/09/11   Historical Provider, MD  citric acid-sodium citrate (ORACIT) 334-500 MG/5ML solution Take 15 mLs by mouth every morning. 1 Tablespoon Daily    Historical Provider, MD  Cyanocobalamin (VITAMIN B-12 IJ) Inject 1 each as  directed every 30 (thirty) days.    Historical Provider, MD  Docusate Sodium (COLACE PO) Take 1 tablet by mouth daily as needed (FOR CONTIPATION).    Historical Provider, MD  famotidine (TH FAMOTIDINE 10) 10 MG tablet Take 10 mg by mouth every morning.    Historical Provider, MD  furosemide (LASIX) 40 MG tablet Take 1 tablet (40 mg total) by mouth every other day. 05/14/15   Jettie Booze, MD  gabapentin (NEURONTIN) 100 MG capsule Take 3 capsules (300 mg total) by mouth 3 (three) times daily. 09/24/15   Penni Bombard, MD  sodium fluoride (FLUORISHIELD) 1.1 % GEL dental gel Instill one drop of gel per tooth space of fluoride tray. Place over teeth for 5 minutes. Remove. Spit out excess. Repeat nightly. 12/17/15   Lenn Cal, DDS  Tiotropium Bromide Monohydrate (SPIRIVA HANDIHALER IN) Inhale 2 puffs into the lungs daily.    Historical Provider, MD  traMADol (ULTRAM) 50 MG tablet Take by mouth every 6 (six) hours as needed.    Historical Provider, MD     Vital Signs: Blood pressure 149/74, heart rate 67, temperature 97.9, O2 sat 95% room air   Physical Exam patient awake, alert. Chest with slightly diminished breath sounds at bases; heart with irregular rhythm, positive murmur/valvular click; abdomen soft, positive bowel sounds, nontender; 1+ bilateral lower extremity edema; palpable left neck adenopathy  Imaging: No results found.  Labs:  CBC:  Recent Labs  01/01/16 0750  WBC 6.2  HGB 13.8  HCT 41.3  PLT 205    COAGS:  Recent Labs  01/01/16 0750  INR 0.99  APTT 34    BMP:  Recent Labs  01/01/16 0750  NA 136  K 4.1  CL 102  CO2 26  GLUCOSE 94  BUN 31*  CALCIUM 9.5  CREATININE 1.91*  GFRNONAA 31*  GFRAA 36*    LIVER FUNCTION TESTS: No results for input(s): BILITOT, AST, ALT, ALKPHOS, PROT, ALBUMIN in the last 8760 hours.  Assessment and Plan: Pt with recently diagnosed  squamous cell carcinoma of the tonsillar fossa with tongue invasion. Plan  today is for both Port-A-Cath and percutaneous gastrostomy tube placements prior to planned radiation therapy. Details/risks of procedures, including but not limited to, internal bleeding, infection, injury to adjacent structures, discussed with patient and wife with their understanding and consent.   Electronically Signed: D. Rowe Robert 01/01/2016, 8:50 AM   I spent a total of 25 minutes at the the patient's bedside AND on the patient's hospital floor or unit, greater than 50% of which was counseling/coordinating care for Port-A-Cath and percutaneous gastrostomy tube placements

## 2016-01-02 ENCOUNTER — Encounter (HOSPITAL_COMMUNITY): Payer: Medicare Other

## 2016-01-03 NOTE — Progress Notes (Signed)
Oncology Nurse Navigator Documentation  Arrived [facility] Short Stay to provide additional PEG education.  [name] as sleeping, spoke with wife.  Using  PEG teaching device   and Teach Back, provided education re PEG care, including hand hygiene, care of tube insertion site including daily dressing change and cleaning, S&S of infection.  [name] correctly verbalized dressing change and cleaning procedures. I reviewed with her bolus flushing procedure discussed earlier this week at [facility] [place]. She understands I will be available for ongoing PEG support. I discussed contents of PEG care 'starter kit' delivered by [facility]. She voiced understanding of information provided, understands I can be contacted with questions/concerns.  Gayleen Orem, RN, BSN, [place] at [place] [phone]

## 2016-01-03 NOTE — Progress Notes (Signed)
Oncology Nurse Navigator Documentation  Met with [name] during [facility] [place].  He was accompanied by his wife.  Provided verbal and written overview of [employer], the clinicians who will be seeing him, encouraged him to ask questions during his time with them.  He was seen by Nutrition, SLP, PT and SW. Using  PEG teaching device   and Teach Back, provided education for bolus administration of daily water flushes, nutritional supplement, fluids and medications.  They    provided correct return demonstration.  They understand I will provide additional education Wednesday he arrives for PEG/PAC placement.  Spoke with him at end of [facility], addressed questions. He understands I can be contacted with needs/concerns.  Gayleen Orem, RN, BSN, [place] at [place] [phone]

## 2016-01-06 ENCOUNTER — Ambulatory Visit
Admission: RE | Admit: 2016-01-06 | Discharge: 2016-01-06 | Disposition: A | Discharge status: Home / Self Care | Payer: Medicare Other | Source: Ambulatory Visit | Attending: Radiation Oncology | Admitting: Radiation Oncology

## 2016-01-06 ENCOUNTER — Encounter: Payer: Self-pay | Admitting: *Deleted

## 2016-01-06 DIAGNOSIS — C090 Malignant neoplasm of tonsillar fossa: Secondary | ICD-10-CM

## 2016-01-06 DIAGNOSIS — Z510 Encounter for antineoplastic radiation therapy: Secondary | ICD-10-CM | POA: Diagnosis not present

## 2016-01-06 NOTE — Progress Notes (Signed)
   Weekly Management Note:  Outpatient    ICD-9-CM ICD-10-CM   1. Carcinoma of tonsillar fossa (HCC) 146.1 C09.0     Current Dose:  2 Gy  Projected Dose: 70 Gy   Narrative:  The patient presents for routine under treatment assessment.  CBCT/MVCT images/Port film x-rays were reviewed.  The chart was checked. Patient and wife inquire about dressings and care of new PAC and PEG sites  Physical Findings:  Wt Readings from Last 3 Encounters:  12/31/15 140 lb 14.4 oz (63.9 kg)  12/11/15 148 lb 14.4 oz (67.5 kg)  12/03/15 147 lb 8 oz (66.9 kg)    vitals were not taken for this visit. in wheelchair, NAD ; seen at [facility] machine  CBC    Component Value Date/Time   WBC 6.2 01/01/2016 0750   RBC 4.13 (L) 01/01/2016 0750   HGB 13.8 01/01/2016 0750   HCT 41.3 01/01/2016 0750   PLT 205 01/01/2016 0750   MCV 100.0 01/01/2016 0750   MCH 33.4 01/01/2016 0750   MCHC 33.4 01/01/2016 0750   RDW 14.5 01/01/2016 0750   LYMPHSABS 1.2 01/01/2016 0750   MONOABS 0.8 01/01/2016 0750   EOSABS 0.3 01/01/2016 0750   BASOSABS 0.0 01/01/2016 0750     CMP     Component Value Date/Time   NA 136 01/01/2016 0750   K 4.1 01/01/2016 0750   CL 102 01/01/2016 0750   CO2 26 01/01/2016 0750   GLUCOSE 94 01/01/2016 0750   BUN 31 (H) 01/01/2016 0750   CREATININE 1.91 (H) 01/01/2016 0750   CALCIUM 9.5 01/01/2016 0750   PROT 3.3 (L) 07/04/2013 0408   ALBUMIN 1.4 (L) 07/04/2013 0408   AST 15 07/04/2013 0408   ALT 8 07/04/2013 0408   ALKPHOS 60 07/04/2013 0408   BILITOT 0.4 07/04/2013 0408   GFRNONAA 31 (L) 01/01/2016 0750   GFRAA 36 (L) 01/01/2016 0750     Impression:  The patient is tolerating radiotherapy.   Plan:  Continue radiotherapy as planned. Will see nursing for further education on care of PEG and PAC sites  -----------------------------------  Eppie Gibson, MD

## 2016-01-06 NOTE — Progress Notes (Signed)
IMRT Device Note Outpatient    ICD-9-CM ICD-10-CM   1. Carcinoma of tonsillar fossa (HCC) 146.1 C09.0     10.1 delivered field widths represent one set of IMRT treatment devices. The code is [phone].  -----------------------------------  Eppie Gibson, MD

## 2016-01-06 NOTE — Progress Notes (Signed)
Oncology Nurse Navigator Documentation  To provide support, encouragement and care continuity, met with Mr. Fitterer for his iniitial Tomo RT.  He was accompanied by his wife.  I reviewed the 2-step treatment process, reminded him of the registration/arrival procedure for subsequent treatments.  His wife observed the treatment, RTT Miranda explained procedure.    Mr. Champney completed treatment without difficulty, denied questions/concerns.  I answered wife's questions re PEG dressing change, demonstrated dressing change.  Also removed occlusive dressing over PAC placed 01/01/16. They understand I can be contacted with questions/concerns.  Gayleen Orem, RN, BSN, [place] at [place] [phone]

## 2016-01-07 ENCOUNTER — Ambulatory Visit
Admission: RE | Admit: 2016-01-07 | Discharge: 2016-01-07 | Disposition: A | Discharge status: Home / Self Care | Payer: Medicare Other | Source: Ambulatory Visit | Attending: Radiation Oncology | Admitting: Radiation Oncology

## 2016-01-07 ENCOUNTER — Encounter (HOSPITAL_COMMUNITY): Payer: Medicare Other

## 2016-01-07 DIAGNOSIS — Z510 Encounter for antineoplastic radiation therapy: Secondary | ICD-10-CM | POA: Diagnosis not present

## 2016-01-08 ENCOUNTER — Ambulatory Visit
Admission: RE | Admit: 2016-01-08 | Discharge: 2016-01-08 | Disposition: A | Discharge status: Home / Self Care | Payer: Medicare Other | Source: Ambulatory Visit | Attending: Radiation Oncology | Admitting: Radiation Oncology

## 2016-01-08 ENCOUNTER — Encounter (HOSPITAL_COMMUNITY): Payer: Medicare Other

## 2016-01-08 DIAGNOSIS — Z510 Encounter for antineoplastic radiation therapy: Secondary | ICD-10-CM | POA: Diagnosis not present

## 2016-01-09 ENCOUNTER — Encounter (HOSPITAL_COMMUNITY): Payer: Medicare Other

## 2016-01-09 ENCOUNTER — Ambulatory Visit
Admission: RE | Admit: 2016-01-09 | Discharge: 2016-01-09 | Disposition: A | Discharge status: Home / Self Care | Payer: Medicare Other | Source: Ambulatory Visit | Attending: Radiation Oncology | Admitting: Radiation Oncology

## 2016-01-09 DIAGNOSIS — Z510 Encounter for antineoplastic radiation therapy: Secondary | ICD-10-CM | POA: Diagnosis not present

## 2016-01-10 ENCOUNTER — Ambulatory Visit
Admission: RE | Admit: 2016-01-10 | Discharge: 2016-01-10 | Disposition: A | Discharge status: Home / Self Care | Payer: Medicare Other | Source: Ambulatory Visit | Attending: Radiation Oncology | Admitting: Radiation Oncology

## 2016-01-10 DIAGNOSIS — Z510 Encounter for antineoplastic radiation therapy: Secondary | ICD-10-CM | POA: Diagnosis not present

## 2016-01-13 ENCOUNTER — Encounter: Payer: Self-pay | Admitting: Radiation Oncology

## 2016-01-13 ENCOUNTER — Ambulatory Visit: Payer: Medicare Other

## 2016-01-13 ENCOUNTER — Ambulatory Visit
Admission: RE | Admit: 2016-01-13 | Discharge: 2016-01-13 | Disposition: A | Discharge status: Home / Self Care | Payer: Medicare Other | Source: Ambulatory Visit | Attending: Radiation Oncology | Admitting: Radiation Oncology

## 2016-01-13 VITALS — BP 135/58 | HR 80 | Temp 98.0°F | Resp 16 | Ht 69.0 in | Wt 136.5 lb

## 2016-01-13 DIAGNOSIS — C090 Malignant neoplasm of tonsillar fossa: Secondary | ICD-10-CM

## 2016-01-13 DIAGNOSIS — Z510 Encounter for antineoplastic radiation therapy: Secondary | ICD-10-CM | POA: Diagnosis not present

## 2016-01-13 MED ORDER — SUCRALFATE 1 G PO TABS
ORAL_TABLET | ORAL | 5 refills | Status: AC
Start: 2016-01-13 — End: ?

## 2016-01-13 MED ORDER — LIDOCAINE VISCOUS 2 % MT SOLN: OROMUCOSAL | 5 refills | Status: AC

## 2016-01-13 MED ORDER — PROMETHAZINE HCL 25 MG PO TABS: 25.0000 mg | ORAL_TABLET | Freq: Four times a day (QID) | ORAL | 5 refills | Status: AC | PRN

## 2016-01-13 MED FILL — PROMETHAZINE 25 MG TABLET: 25 | 10 days supply | Qty: 40 | Fill #0

## 2016-01-13 MED FILL — SUCRALFATE 1 GM TABLET: 1 | 10 days supply | Qty: 40 | Fill #0

## 2016-01-13 MED FILL — LIDOCAINE 2% VISCOUS SOLN: 2 | 5 days supply | Qty: 100 | Fill #0

## 2016-01-13 NOTE — Progress Notes (Addendum)
   Weekly Management Note:  Outpatient    ICD-9-CM ICD-10-CM   1. Carcinoma of tonsillar fossa (HCC) 146.1 C09.0 promethazine (PHENERGAN) 25 MG tablet     lidocaine (XYLOCAINE) 2 % solution     sucralfate (CARAFATE) 1 g tablet    Current Dose:  12 Gy  Projected Dose: 70 Gy   Narrative:  The patient presents for routine under treatment assessment.  CBCT/MVCT images/Port film x-rays were reviewed.  The chart was checked. He has received 6  fractions to left tonsil/tongue .  Has a feeding tube flushing it  with water 60 cc  Once a day.  Encouraged to keep her self hydrated by taking in adequate liquids each day.  Having fatigue all day,  change PEG tube site is healing without signs of infections, redness or drainage.  Having some difficulty chewing  and the mouth is sore, no throat changes and  taste changes . No mouth changes.   Encouraged to start using the sonafine to her neck skin looks normal. Chronic left sided trigeminal neuralgia reported.  Using sonafine bid to neck.  Physical Findings:  Wt Readings from Last 3 Encounters:  01/13/16 136 lb 8 oz (61.9 kg)  12/31/15 140 lb 14.4 oz (63.9 kg)  12/11/15 148 lb 14.4 oz (67.5 kg)    height is 5\' 9"  (1.753 m) and weight is 136 lb 8 oz (61.9 kg). His oral temperature is 98 F (36.7 C). His blood pressure is 135/58 (abnormal) and his pulse is 80. His respiration is 16 and oxygen saturation is 98%.  in wheelchair, NAD ; thick oral secretions with left posterior tongue tumor.  CBC    Component Value Date/Time   WBC 6.2 01/01/2016 0750   RBC 4.13 (L) 01/01/2016 0750   HGB 13.8 01/01/2016 0750   HCT 41.3 01/01/2016 0750   PLT 205 01/01/2016 0750   MCV 100.0 01/01/2016 0750   MCH 33.4 01/01/2016 0750   MCHC 33.4 01/01/2016 0750   RDW 14.5 01/01/2016 0750   LYMPHSABS 1.2 01/01/2016 0750   MONOABS 0.8 01/01/2016 0750   EOSABS 0.3 01/01/2016 0750   BASOSABS 0.0 01/01/2016 0750     CMP     Component Value Date/Time   NA 136  01/01/2016 0750   K 4.1 01/01/2016 0750   CL 102 01/01/2016 0750   CO2 26 01/01/2016 0750   GLUCOSE 94 01/01/2016 0750   BUN 31 (H) 01/01/2016 0750   CREATININE 1.91 (H) 01/01/2016 0750   CALCIUM 9.5 01/01/2016 0750   PROT 3.3 (L) 07/04/2013 0408   ALBUMIN 1.4 (L) 07/04/2013 0408   AST 15 07/04/2013 0408   ALT 8 07/04/2013 0408   ALKPHOS 60 07/04/2013 0408   BILITOT 0.4 07/04/2013 0408   GFRNONAA 31 (L) 01/01/2016 0750   GFRAA 36 (L) 01/01/2016 0750     Impression:  The patient is tolerating radiotherapy.   Plan:  Continue radiotherapy as planned. Rx as above for nausea, mucositis in future. Gayleen Orem, RN, our Head and Neck Oncology Navigator to educate today on PEG usage - encouraging to start this w/ more supplements for weight gain. Cordella Register will see him at end of week. 12 lb loss/4weeks.  -----------------------------------  Eppie Gibson, MD

## 2016-01-13 NOTE — Progress Notes (Signed)
[name] has received 6  fractions to left tonsil/tongue .  Has a feeding tube flushing it  with water 60 cc  Once a day.  Encouraged to keep her self hydrated by taking in adequate liquids each day.  Having fatigue all day,  change PEG tube site is healing without signs of infections, redness or drainage.  Having some difficulty chewing  and the mouth is sore, no throat changes and  taste changes . No mouth changes.   Encouraged to start using the sonafine to her neck skin looks normal. Denies pain today.  Using sonafine bid to neck. Wt Readings from Last 3 Encounters:  01/13/16 136 lb 8 oz (61.9 kg)  12/31/15 140 lb 14.4 oz (63.9 kg)  12/11/15 148 lb 14.4 oz (67.5 kg)  BP (!) 135/58 (BP Location: Left Arm, Patient Position: Sitting, Cuff Size: Normal)   Pulse 80   Temp 98 F (36.7 C) (Oral)   Resp 16   Ht 5\' 9"  (1.753 m)   Wt 136 lb 8 oz (61.9 kg)   SpO2 98%   BMI 20.16 kg/m

## 2016-01-14 ENCOUNTER — Ambulatory Visit
Admission: RE | Admit: 2016-01-14 | Discharge: 2016-01-14 | Disposition: A | Discharge status: Home / Self Care | Payer: Medicare Other | Source: Ambulatory Visit | Attending: Radiation Oncology | Admitting: Radiation Oncology

## 2016-01-14 ENCOUNTER — Ambulatory Visit: Payer: Medicare Other

## 2016-01-14 ENCOUNTER — Encounter (HOSPITAL_COMMUNITY): Payer: Medicare Other

## 2016-01-14 DIAGNOSIS — Z510 Encounter for antineoplastic radiation therapy: Secondary | ICD-10-CM | POA: Diagnosis not present

## 2016-01-14 NOTE — Telephone Encounter (Signed)
Spoke with wife, Mardene Celeste and advised that because her husband is now undergoing radiation treatments for oral cancer, that they should discuss medication increase with radiation oncologist. Ms Resendiz stated they did discuss it with Dr Isidore Moos, and she advised that due to this being a nerve issue, his neurologist needs to address. Informed her that Dr Leta Baptist is out of the office until late tomorrow afternoon. Advised her that per Epic note, as of 12/04/15 patient was taking Gabapentin 400 mg three times a day. She stated that he was at one time, but he is not now because he may run out of medication before he can refill.  Inquired if he has enough medication to increase dose back to 400 mg three times a day, or to at least take that dose at times when he is experiencing the worst of his facial pain. She stated he has enough medication to last through tomorrow if taking 400 mg three times a day. He can refill on 01/18/16, per wife. Informed her this RN will discuss with Dr Leta Baptist and call her back with his response as soon as possible. She verbalized understanding, appreciation.

## 2016-01-14 NOTE — Telephone Encounter (Signed)
Pt's wife called said the gabapentin needs to be increased. She said he is still having constant and shooting pain in his face. Please call after 1pm

## 2016-01-15 ENCOUNTER — Ambulatory Visit
Admission: RE | Admit: 2016-01-15 | Discharge: 2016-01-15 | Disposition: A | Discharge status: Home / Self Care | Payer: Medicare Other | Source: Ambulatory Visit | Attending: Radiation Oncology | Admitting: Radiation Oncology

## 2016-01-15 ENCOUNTER — Ambulatory Visit: Payer: Medicare Other

## 2016-01-15 ENCOUNTER — Encounter (HOSPITAL_COMMUNITY): Payer: Medicare Other

## 2016-01-15 DIAGNOSIS — Z510 Encounter for antineoplastic radiation therapy: Secondary | ICD-10-CM | POA: Diagnosis not present

## 2016-01-15 MED ORDER — GABAPENTIN 300 MG PO CAPS: 300.0000 mg | ORAL_CAPSULE | Freq: Three times a day (TID) | ORAL | 12 refills | Status: AC

## 2016-01-15 NOTE — Addendum Note (Signed)
Addended byAndrey Spearman on: 01/15/2016 04:38 PM   Modules accepted: Orders

## 2016-01-15 NOTE — Telephone Encounter (Signed)
May increase gabapentin up to 600mg  three times per day. Rx sent in. -VRP

## 2016-01-15 NOTE — Telephone Encounter (Signed)
Spoke with wife and informed her Dr Leta Baptist sent in new Rx for Gabapentin. Advised her of dose he may take per Dr Gladstone Lighter instructions; she verbalized understanding, appreciation.

## 2016-01-16 ENCOUNTER — Ambulatory Visit: Payer: Medicare Other

## 2016-01-16 ENCOUNTER — Ambulatory Visit
Admission: RE | Admit: 2016-01-16 | Discharge: 2016-01-16 | Disposition: A | Discharge status: Home / Self Care | Payer: Medicare Other | Source: Ambulatory Visit | Attending: Radiation Oncology | Admitting: Radiation Oncology

## 2016-01-16 ENCOUNTER — Encounter (HOSPITAL_COMMUNITY): Payer: Medicare Other

## 2016-01-16 DIAGNOSIS — Z510 Encounter for antineoplastic radiation therapy: Secondary | ICD-10-CM | POA: Diagnosis not present

## 2016-01-17 ENCOUNTER — Ambulatory Visit: Payer: Medicare Other | Admitting: Nutrition

## 2016-01-17 ENCOUNTER — Ambulatory Visit
Admission: RE | Admit: 2016-01-17 | Discharge: 2016-01-17 | Disposition: A | Discharge status: Home / Self Care | Payer: Medicare Other | Source: Ambulatory Visit | Attending: Radiation Oncology | Admitting: Radiation Oncology

## 2016-01-17 VITALS — Wt 135.6 lb

## 2016-01-17 DIAGNOSIS — C090 Malignant neoplasm of tonsillar fossa: Secondary | ICD-10-CM

## 2016-01-17 DIAGNOSIS — Z510 Encounter for antineoplastic radiation therapy: Secondary | ICD-10-CM | POA: Diagnosis not present

## 2016-01-17 MED ORDER — OSMOLITE 1.5 CAL PO LIQD
ORAL | 0 refills | Status: AC
Start: 2016-01-17 — End: ?

## 2016-01-17 NOTE — Progress Notes (Signed)
Nutrition follow-up completed with patient who is being treated for tonsil cancer.  He is receiving radiation therapy. Weight decreased and was documented as 135.6 pounds down from 140.9 pounds September 12. Patient has had continued weight loss. He is eating very small amounts of soft diet. He has tolerated one can of Osmolite 1.5 via PEG with 60 cc free water before and after 1 time a day.  Estimated nutrition needs: 1850-2150 calories, 90-100 grams protein, 2.2 L fluid.  Nutrition diagnosis: Malnutrition continues.  Intervention:  Educated patient to begin one can Osmolite 1.5 - 4 times a day with 60 cc free water before and after bolus feeding.  In addition patient was educated to consume 3 - 8 ounce glasses of water daily. This will provide 1420 kcal, 59.6 g protein, 1920 mL free water. Patient will continue to eat as tolerated. Will increase bolus tube feedings to goal rate of 1-1/2 cans Osmolite 1.5 - 4 times a day once oral intake decreases or weight loss continues. Orders were written and advanced homecare was notified. Provided written instructions for patient.  Teach back method was used.  Monitoring, evaluation, goals:  Patient will tolerate increased calories and protein from oral diet and tube feeding to minimize further weight loss.  Next visit: Wednesday, October 4 after radiation treatment.  **Disclaimer: This note was dictated with voice recognition software. Similar sounding words can inadvertently be transcribed and this note may contain transcription errors which may not have been corrected upon publication of note.**

## 2016-01-20 ENCOUNTER — Telehealth: Payer: Self-pay | Admitting: *Deleted

## 2016-01-20 ENCOUNTER — Ambulatory Visit (HOSPITAL_BASED_OUTPATIENT_CLINIC_OR_DEPARTMENT_OTHER): Payer: Medicare Other | Admitting: Hematology and Oncology

## 2016-01-20 ENCOUNTER — Ambulatory Visit
Admission: RE | Admit: 2016-01-20 | Discharge: 2016-01-20 | Disposition: A | Discharge status: Home / Self Care | Payer: Medicare Other | Source: Ambulatory Visit | Attending: Radiation Oncology | Admitting: Radiation Oncology

## 2016-01-20 ENCOUNTER — Encounter: Payer: Self-pay | Admitting: Hematology and Oncology

## 2016-01-20 ENCOUNTER — Ambulatory Visit (HOSPITAL_COMMUNITY): Payer: Self-pay | Admitting: Dentistry

## 2016-01-20 ENCOUNTER — Encounter: Payer: Self-pay | Admitting: *Deleted

## 2016-01-20 DIAGNOSIS — C090 Malignant neoplasm of tonsillar fossa: Secondary | ICD-10-CM

## 2016-01-20 DIAGNOSIS — Z882 Allergy status to sulfonamides status: Secondary | ICD-10-CM | POA: Diagnosis not present

## 2016-01-20 DIAGNOSIS — Z8249 Family history of ischemic heart disease and other diseases of the circulatory system: Secondary | ICD-10-CM | POA: Diagnosis not present

## 2016-01-20 DIAGNOSIS — E7800 Pure hypercholesterolemia, unspecified: Secondary | ICD-10-CM | POA: Diagnosis not present

## 2016-01-20 DIAGNOSIS — I482 Chronic atrial fibrillation, unspecified: Secondary | ICD-10-CM

## 2016-01-20 DIAGNOSIS — Z931 Gastrostomy status: Secondary | ICD-10-CM | POA: Diagnosis not present

## 2016-01-20 DIAGNOSIS — Z85038 Personal history of other malignant neoplasm of large intestine: Secondary | ICD-10-CM | POA: Diagnosis not present

## 2016-01-20 DIAGNOSIS — Z79899 Other long term (current) drug therapy: Secondary | ICD-10-CM | POA: Diagnosis not present

## 2016-01-20 DIAGNOSIS — Z87891 Personal history of nicotine dependence: Secondary | ICD-10-CM | POA: Diagnosis not present

## 2016-01-20 DIAGNOSIS — C029 Malignant neoplasm of tongue, unspecified: Secondary | ICD-10-CM | POA: Diagnosis present

## 2016-01-20 DIAGNOSIS — I4891 Unspecified atrial fibrillation: Secondary | ICD-10-CM | POA: Diagnosis not present

## 2016-01-20 DIAGNOSIS — E538 Deficiency of other specified B group vitamins: Secondary | ICD-10-CM | POA: Diagnosis not present

## 2016-01-20 DIAGNOSIS — I129 Hypertensive chronic kidney disease with stage 1 through stage 4 chronic kidney disease, or unspecified chronic kidney disease: Secondary | ICD-10-CM | POA: Diagnosis not present

## 2016-01-20 DIAGNOSIS — Z7901 Long term (current) use of anticoagulants: Secondary | ICD-10-CM | POA: Diagnosis not present

## 2016-01-20 DIAGNOSIS — Z888 Allergy status to other drugs, medicaments and biological substances status: Secondary | ICD-10-CM | POA: Diagnosis not present

## 2016-01-20 DIAGNOSIS — I739 Peripheral vascular disease, unspecified: Secondary | ICD-10-CM | POA: Diagnosis not present

## 2016-01-20 DIAGNOSIS — N189 Chronic kidney disease, unspecified: Secondary | ICD-10-CM | POA: Diagnosis not present

## 2016-01-20 DIAGNOSIS — Z833 Family history of diabetes mellitus: Secondary | ICD-10-CM | POA: Diagnosis not present

## 2016-01-20 DIAGNOSIS — I2510 Atherosclerotic heart disease of native coronary artery without angina pectoris: Secondary | ICD-10-CM | POA: Diagnosis not present

## 2016-01-20 DIAGNOSIS — Z8551 Personal history of malignant neoplasm of bladder: Secondary | ICD-10-CM | POA: Diagnosis not present

## 2016-01-20 DIAGNOSIS — Z510 Encounter for antineoplastic radiation therapy: Secondary | ICD-10-CM | POA: Diagnosis present

## 2016-01-20 NOTE — Addendum Note (Signed)
Encounter addended by: Eppie Gibson, MD on: 01/20/2016  1:01 PM<BR>    Actions taken: Sign clinical note

## 2016-01-20 NOTE — Progress Notes (Signed)
   Weekly Management Note:  Outpatient    ICD-9-CM ICD-10-CM   1. Carcinoma of tonsillar fossa (HCC) 146.1 C09.0     Current Dose:  22 Gy  Projected Dose: 70 Gy   Narrative:  The patient presents for routine under treatment assessment.  CBCT/MVCT images/Port film x-rays were reviewed.  The chart was checked. He has received 11 fractions to left tonsil/tongue .  No new concerns.  Takes food by mouth and PEG  Physical Findings:  Wt Readings from Last 3 Encounters:  01/17/16 135 lb 9.6 oz (61.5 kg)  01/13/16 136 lb 8 oz (61.9 kg)  12/31/15 140 lb 14.4 oz (63.9 kg)    vitals were not taken for this visit. in wheelchair, NAD ; thick oral secretions , whitish surface to tongue, but not obvious thrush CBC    Component Value Date/Time   WBC 6.2 01/01/2016 0750   RBC 4.13 (L) 01/01/2016 0750   HGB 13.8 01/01/2016 0750   HCT 41.3 01/01/2016 0750   PLT 205 01/01/2016 0750   MCV 100.0 01/01/2016 0750   MCH 33.4 01/01/2016 0750   MCHC 33.4 01/01/2016 0750   RDW 14.5 01/01/2016 0750   LYMPHSABS 1.2 01/01/2016 0750   MONOABS 0.8 01/01/2016 0750   EOSABS 0.3 01/01/2016 0750   BASOSABS 0.0 01/01/2016 0750     CMP     Component Value Date/Time   NA 136 01/01/2016 0750   K 4.1 01/01/2016 0750   CL 102 01/01/2016 0750   CO2 26 01/01/2016 0750   GLUCOSE 94 01/01/2016 0750   BUN 31 (H) 01/01/2016 0750   CREATININE 1.91 (H) 01/01/2016 0750   CALCIUM 9.5 01/01/2016 0750   PROT 3.3 (L) 07/04/2013 0408   ALBUMIN 1.4 (L) 07/04/2013 0408   AST 15 07/04/2013 0408   ALT 8 07/04/2013 0408   ALKPHOS 60 07/04/2013 0408   BILITOT 0.4 07/04/2013 0408   GFRNONAA 31 (L) 01/01/2016 0750   GFRAA 36 (L) 01/01/2016 0750     Impression:  The patient is tolerating radiotherapy.   Plan:  Continue radiotherapy as planned. Pt late for med/onc appt and symptom management.  Vitals will be obtained there. -----------------------------------  Eppie Gibson, MD

## 2016-01-20 NOTE — Assessment & Plan Note (Signed)
He will continue medical management and anticoagulation therapy as directed by his cardiologist

## 2016-01-20 NOTE — Assessment & Plan Note (Signed)
He tolerated treatment well with expected side effects. I will continue to see him on a weekly basis for supportive care

## 2016-01-20 NOTE — Telephone Encounter (Signed)
Oncology Nurse Navigator Documentation  Called Mr. Kubicek, informed him of appt conflict this morning with Dr. Excell Seltzer and Dr. Alvy Bimler, asked him to arrive at 11:15 for [facility] appt. He agreed.  Gayleen Orem, RN, BSN, [place] at [place] [phone]

## 2016-01-20 NOTE — Progress Notes (Signed)
[place] OFFICE PROGRESS NOTE  Patient Care Team: Lajean Manes, MD as PCP - General (Internal Medicine) Newt Minion, MD as Consulting Physician (Orthopedic Surgery) Heath Lark, MD as Consulting Physician (Hematology and Oncology) Jerrell Belfast, MD as Consulting Physician (Otolaryngology) Penni Bombard, MD as Consulting Physician (Neurology) Leota Sauers, RN as Oncology Nurse Navigator Eppie Gibson, MD as Attending Physician (Radiation Oncology) Karie Mainland, RD as Dietitian (Nutrition) Jettie Booze, MD as Consulting Physician (Cardiology)  SUMMARY OF ONCOLOGIC HISTORY:   Tongue cancer ([place])   08/29/2015 Imaging    Outside ECHO at [facility]: NORMAL LEFT VENTRICULAR FUNCTION WITH MILD LVH, NORMAL RIGHT VENTRICULAR SYSTOLIC FUNCTION, VALVULAR REGURGITATION: MODERATE AR, MILD MR, MILD PR, MILD TR VALVULAR STENOSIS: MILD MS MILD-MODERATE PARAVALVULAR AORTIC REGURGITATION      09/26/2015 Miscellaneous    He was seen by Dr. Wilburn Cornelia. Mild tongue fullness was noted and CT was recommended      11/13/2015 Imaging    MRI orbit showed Interval growth of left tonsillar mass centered at the glossal tonsillar sulcus and extending superiorly along the target mandibular raphe. There is no definite intracranial extension. Left level 2 and level 3 adenopathy       11/19/2015 Procedure    He underwent left lymph node FNA with local anaethetics      11/19/2015 Pathology Results     [id number] Biopsy confirmed squamous cell carcinoma      12/10/2015 PET scan    Intensely hypermetabolic left palatine tonsil/left glossotonsillar sulcus/left posterior tongue mass, consistent with primary head and neck carcinoma. Hypermetabolic left level 2 and left level 3 cervical nodal metastases. No definite hypermetabolic metastatic disease in the chest, abdomen or pelvis.      01/01/2016 Procedure    Successful placement of a right IJ approach Power Port with ultrasound and  fluoroscopic guidance. The catheter is ready for use.      01/01/2016 Procedure    Successful placement of a 20 French pull-through gastrostomy tube.      01/06/2016 -  Radiation Therapy    The patient received radiation treatment only without chemotherapy due to multiple other comorbidities       INTERVAL HISTORY: Please see below for problem oriented charting. He is seen today for supportive care. Initially, soon after radiation treatment he had weight loss. Since he started using his feeding tube, he started to gain some weight. He denies pain. Denies dizziness, chest pain or shortness of breath. Denies nausea or vomiting  REVIEW OF SYSTEMS:   Constitutional: Denies fevers, chills or abnormal weight loss Eyes: Denies blurriness of vision Ears, nose, mouth, throat, and face: Denies mucositis or sore throat Respiratory: Denies cough, dyspnea or wheezes Cardiovascular: Denies palpitation, chest discomfort or lower extremity swelling Gastrointestinal:  Denies nausea, heartburn or change in bowel habits Skin: Denies abnormal skin rashes Lymphatics: Denies new lymphadenopathy or easy bruising Neurological:Denies numbness, tingling or new weaknesses Behavioral/Psych: Mood is stable, no new changes  All other systems were reviewed with the patient and are negative.  I have reviewed the past medical history, past surgical history, social history and family history with the patient and they are unchanged from previous note.  ALLERGIES:  is allergic to sulfa antibiotics and ramipril.  MEDICATIONS:  Current Outpatient Prescriptions  Medication Sig Dispense Refill  . acetaminophen (TYLENOL) 500 MG tablet Take 500 mg by mouth every 6 (six) hours as needed.    Marland Kitchen allopurinol (ZYLOPRIM) 100 MG tablet Take 100 mg by mouth  2 (two) times daily.     Marland Kitchen amLODipine (NORVASC) 2.5 MG tablet TAKE 1 TABLET (2.5 MG TOTAL) BY MOUTH DAILY. 90 tablet 2  . apixaban (ELIQUIS) 2.5 MG TABS tablet Take 1  tablet (2.5 mg total) by mouth 2 (two) times daily. 180 tablet 3  . atorvastatin (LIPITOR) 40 MG tablet Take 40 mg by mouth daily at 6 PM.     . citric acid-sodium citrate (ORACIT) 334-500 MG/5ML solution Take 15 mLs by mouth every morning. 1 Tablespoon Daily    . Cyanocobalamin (VITAMIN B-12 IJ) Inject 1 each as directed every 30 (thirty) days.    Mariane Baumgarten Sodium (COLACE PO) Take 1 tablet by mouth daily as needed (FOR CONTIPATION).    . famotidine (TH FAMOTIDINE 10) 10 MG tablet Take 10 mg by mouth every morning.    . furosemide (LASIX) 40 MG tablet Take 1 tablet (40 mg total) by mouth every other day. 45 tablet 1  . gabapentin (NEURONTIN) 300 MG capsule Take 1-2 capsules (300-600 mg total) by mouth 3 (three) times daily. 180 capsule 12  . Nutritional Supplements (FEEDING SUPPLEMENT, OSMOLITE 1.5 CAL,) LIQD Osmolite 1.5 via PEG, 1 can QID with 60 cc free water before and after bolus feeding. Increase to 1.5 cans Osmolite 1.5 when tolerated. Patient should drink 24 oz water daily or put in PEG. 1422 mL 0  . Tiotropium Bromide Monohydrate (SPIRIVA HANDIHALER IN) Inhale 2 puffs into the lungs daily.    . traMADol (ULTRAM) 50 MG tablet Take by mouth every 6 (six) hours as needed.    . lidocaine (XYLOCAINE) 2 % solution Swish and swallow 75mL of this mixture,46min before meals and at bedtime, up to QID (Patient not taking: Reported on 01/20/2016) 100 mL 5  . promethazine (PHENERGAN) 25 MG tablet Take 1 tablet (25 mg total) by mouth every 6 (six) hours as needed for nausea or vomiting. (Patient not taking: Reported on 01/20/2016) 40 tablet 5  . sodium fluoride (FLUORISHIELD) 1.1 % GEL dental gel Instill one drop of gel per tooth space of fluoride tray. Place over teeth for 5 minutes. Remove. Spit out excess. Repeat nightly. 120 mL 11  . sucralfate (CARAFATE) 1 g tablet Dissolve 1 tablet in 68mL H2O and swallow up to QID,PRN sore throat. (Patient not taking: Reported on 01/20/2016) 40 tablet 5   No current  facility-administered medications for this visit.     PHYSICAL EXAMINATION: ECOG PERFORMANCE STATUS: 2 - Symptomatic, <50% confined to bed  Vitals:   01/20/16 1313 01/20/16 1314  BP: (!) 132/54 (!) 148/52  Pulse: 79 75  Resp: 18   Temp: 98 F (36.7 C)    Filed Weights   01/20/16 1313  Weight: 141 lb 1.6 oz (64 kg)    GENERAL:alert, no distress and comfortable. He looks thin SKIN: skin color, texture, turgor are normal, no rashes or significant lesions EYES: normal, Conjunctiva are pink and non-injected, sclera clear OROPHARYNX:no exudate, no erythema and lips, buccal mucosa, and tongue normal  NECK: supple, thyroid normal size, non-tender, without nodularity LYMPH:  He has palpable neck lymphadenopathy, unchanged compared to previous visit  LUNGS: clear to auscultation and percussion with normal breathing effort HEART: regular rate & rhythm and no murmurs and no lower extremity edema ABDOMEN:abdomen soft, non-tender and normal bowel sounds. Feeding tube site looks okay Musculoskeletal:no cyanosis of digits and no clubbing  NEURO: alert & oriented x 3 with fluent speech, no focal motor/sensory deficits  LABORATORY DATA:  I have reviewed the  data as listed    Component Value Date/Time   NA 136 01/01/2016 0750   K 4.1 01/01/2016 0750   CL 102 01/01/2016 0750   CO2 26 01/01/2016 0750   GLUCOSE 94 01/01/2016 0750   BUN 31 (H) 01/01/2016 0750   CREATININE 1.91 (H) 01/01/2016 0750   CALCIUM 9.5 01/01/2016 0750   PROT 3.3 (L) 07/04/2013 0408   ALBUMIN 1.4 (L) 07/04/2013 0408   AST 15 07/04/2013 0408   ALT 8 07/04/2013 0408   ALKPHOS 60 07/04/2013 0408   BILITOT 0.4 07/04/2013 0408   GFRNONAA 31 (L) 01/01/2016 0750   GFRAA 36 (L) 01/01/2016 0750    No results found for: SPEP, UPEP  Lab Results  Component Value Date   WBC 6.2 01/01/2016   NEUTROABS 3.8 01/01/2016   HGB 13.8 01/01/2016   HCT 41.3 01/01/2016   MCV 100.0 01/01/2016   PLT 205 01/01/2016       Chemistry      Component Value Date/Time   NA 136 01/01/2016 0750   K 4.1 01/01/2016 0750   CL 102 01/01/2016 0750   CO2 26 01/01/2016 0750   BUN 31 (H) 01/01/2016 0750   CREATININE 1.91 (H) 01/01/2016 0750      Component Value Date/Time   CALCIUM 9.5 01/01/2016 0750   ALKPHOS 60 07/04/2013 0408   AST 15 07/04/2013 0408   ALT 8 07/04/2013 0408   BILITOT 0.4 07/04/2013 0408       ASSESSMENT & PLAN:  Tongue cancer ([place]) He tolerated treatment well with expected side effects. I will continue to see him on a weekly basis for supportive care  Chronic atrial fibrillation [facility]) He will continue medical management and anticoagulation therapy as directed by his cardiologist  S/P gastrostomy [facility]) He had recent weight loss. Since he started to use his feeding tube, he has gained some weight. He has very mild intermittent bleeding around the feeding tube site which I suspect is related to chronic anticoagulation therapy. I recommend conservative management for now   No orders of the defined types were placed in this encounter.  All questions were answered. The patient knows to call the clinic with any problems, questions or concerns. No barriers to learning was detected. I spent 15 minutes counseling the patient face to face. The total time spent in the appointment was 20 minutes and more than 50% was on counseling and review of test results     Heath Lark, MD 01/20/2016 1:51 PM

## 2016-01-20 NOTE — Assessment & Plan Note (Signed)
He had recent weight loss. Since he started to use his feeding tube, he has gained some weight. He has very mild intermittent bleeding around the feeding tube site which I suspect is related to chronic anticoagulation therapy. I recommend conservative management for now

## 2016-01-21 ENCOUNTER — Ambulatory Visit
Admission: RE | Admit: 2016-01-21 | Discharge: 2016-01-21 | Disposition: A | Discharge status: Home / Self Care | Payer: Medicare Other | Source: Ambulatory Visit | Attending: Radiation Oncology | Admitting: Radiation Oncology

## 2016-01-21 ENCOUNTER — Encounter: Payer: Self-pay | Admitting: Nutrition

## 2016-01-21 ENCOUNTER — Encounter (HOSPITAL_COMMUNITY): Payer: Medicare Other

## 2016-01-21 DIAGNOSIS — Z510 Encounter for antineoplastic radiation therapy: Secondary | ICD-10-CM | POA: Diagnosis not present

## 2016-01-21 NOTE — Progress Notes (Signed)
Oncology Nurse Navigator Documentation  Met with Mr. Sensabaugh and his wife during Tom[facility] treatment and afterwards during appt with Dr. Dr. Alvy Bimler.  He has had minimal weight gain and is using PEG for nutritional supplement.  They denied difficulty with gravity instillation, confirmed water flushes before and after supplement.  Wife expressed concern about skin irritation from tape (using paper), continuous scant blood at PEG insertion site. Dr. Alvy Bimler explained his BID Eliquis is likely cause, not of concern.  I checked PEG, noticed scant almost imperceptible blood on dressing; wife stated this is what she sees with daily dressing changes.  To alleviate skin irritation caused by tape, I encouraged use of previously provided mesh brief to secure PEG tubing.  He expressed some hesitation because of the nature of the device but agreed to try.  I suggested application of Neosporin to areas of skin irritation. They understand I can be contacted with further needs/concerns.  Gayleen Orem, RN, BSN, [place] at [place] [phone]

## 2016-01-22 ENCOUNTER — Ambulatory Visit: Payer: Medicare Other | Admitting: Nutrition

## 2016-01-22 ENCOUNTER — Ambulatory Visit
Admission: RE | Admit: 2016-01-22 | Discharge: 2016-01-22 | Disposition: A | Discharge status: Home / Self Care | Payer: Medicare Other | Source: Ambulatory Visit | Attending: Radiation Oncology | Admitting: Radiation Oncology

## 2016-01-22 ENCOUNTER — Encounter (HOSPITAL_COMMUNITY): Payer: Medicare Other

## 2016-01-22 DIAGNOSIS — Z510 Encounter for antineoplastic radiation therapy: Secondary | ICD-10-CM | POA: Diagnosis not present

## 2016-01-22 NOTE — Progress Notes (Signed)
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 Weight has improved and was documented as 144.4 pounds increased from 135.6 pounds. Patient is tolerating Osmolite 1.5 via PEG with 60cc free water before and after bolus feedings. Patient is gradually increasing Osmolite 1.5 to goal rate.  He is currently able to tolerate 1can and a fourth 4 times a day working up to 1.5 cans a 4 times a day.  Estimated nutrition needs: 1850-2150 calories, 90-100 grams protein, 2.2 L fluid.  Nutrition diagnosis: Malnutrition continues.  Intervention: Patient will continue increasing Osmolite 1.5 to goal rate 1-1/2 cans 4 times a day with 60 cc free water before and after bolus feedings. In addition patient will consume 3 - 8 ounce glass of water daily or infuse into the PEG. Encouraged patient to consume soft moist foods as tolerated. Provided support and encouragement for patient to continue swallowing exercises. Questions were answered.  Teach back method was used.  6 cans Osmolite 1.5+ free water flushes provides 2130 cal, 89.4 g protein, 2286 mL free water.  Monitoring, evaluation, goals:  Patient will tolerate tube feedings plus oral intake to minimize weight loss and promote healing.  Next visit: Thursday, October 12.  **Disclaimer: This note was dictated with voice recognition software. Similar sounding words can inadvertently be transcribed and this note may contain transcription errors which may not have been corrected upon publication of note.**

## 2016-01-23 ENCOUNTER — Encounter (HOSPITAL_COMMUNITY): Payer: Medicare Other

## 2016-01-23 ENCOUNTER — Ambulatory Visit
Admission: RE | Admit: 2016-01-23 | Discharge: 2016-01-23 | Disposition: A | Discharge status: Home / Self Care | Payer: Medicare Other | Source: Ambulatory Visit | Attending: Radiation Oncology | Admitting: Radiation Oncology

## 2016-01-23 DIAGNOSIS — Z510 Encounter for antineoplastic radiation therapy: Secondary | ICD-10-CM | POA: Diagnosis not present

## 2016-01-24 ENCOUNTER — Ambulatory Visit
Admission: RE | Admit: 2016-01-24 | Discharge: 2016-01-24 | Disposition: A | Discharge status: Home / Self Care | Payer: Medicare Other | Source: Ambulatory Visit | Attending: Radiation Oncology | Admitting: Radiation Oncology

## 2016-01-24 ENCOUNTER — Encounter: Payer: Self-pay | Admitting: *Deleted

## 2016-01-24 DIAGNOSIS — Z510 Encounter for antineoplastic radiation therapy: Secondary | ICD-10-CM | POA: Diagnosis not present

## 2016-01-27 ENCOUNTER — Encounter (HOSPITAL_COMMUNITY): Payer: Self-pay | Admitting: Dentistry

## 2016-01-27 ENCOUNTER — Encounter: Payer: Self-pay | Admitting: Radiation Oncology

## 2016-01-27 ENCOUNTER — Ambulatory Visit (HOSPITAL_COMMUNITY): Payer: Self-pay | Admitting: Dentistry

## 2016-01-27 ENCOUNTER — Ambulatory Visit
Admission: RE | Admit: 2016-01-27 | Discharge: 2016-01-27 | Disposition: A | Discharge status: Home / Self Care | Payer: Medicare Other | Source: Ambulatory Visit | Attending: Radiation Oncology | Admitting: Radiation Oncology

## 2016-01-27 VITALS — BP 144/64 | HR 88 | Temp 97.9°F | Resp 18 | Ht 69.0 in | Wt 142.4 lb

## 2016-01-27 VITALS — BP 143/55 | HR 56 | Temp 98.2°F

## 2016-01-27 DIAGNOSIS — R1310 Dysphagia, unspecified: Secondary | ICD-10-CM

## 2016-01-27 DIAGNOSIS — C090 Malignant neoplasm of tonsillar fossa: Secondary | ICD-10-CM | POA: Diagnosis present

## 2016-01-27 DIAGNOSIS — C029 Malignant neoplasm of tongue, unspecified: Secondary | ICD-10-CM

## 2016-01-27 DIAGNOSIS — R682 Dry mouth, unspecified: Secondary | ICD-10-CM

## 2016-01-27 DIAGNOSIS — K117 Disturbances of salivary secretion: Secondary | ICD-10-CM

## 2016-01-27 DIAGNOSIS — C099 Malignant neoplasm of tonsil, unspecified: Secondary | ICD-10-CM

## 2016-01-27 DIAGNOSIS — Z923 Personal history of irradiation: Secondary | ICD-10-CM

## 2016-01-27 DIAGNOSIS — Z510 Encounter for antineoplastic radiation therapy: Secondary | ICD-10-CM | POA: Diagnosis not present

## 2016-01-27 DIAGNOSIS — R432 Parageusia: Secondary | ICD-10-CM

## 2016-01-27 MED ORDER — SONAFINE EX EMUL
1.0000 | Freq: Once | CUTANEOUS | Status: AC
Start: 2016-01-27 — End: 2016-01-27
  Administered 2016-01-27: 1 via TOPICAL

## 2016-01-27 NOTE — Progress Notes (Signed)
[name] has received 16 fractions to left tonsil/tongue .  Has a feeding tube flushing it  with water 60 cc before and after feeding with Osmolite 1.2 cc per hour during the day, using 6 cans.   Encouraged to keep himself self hydrated by taking in adequate liquids each day.  Having fatigue all day,  change PEG tube site is healing without signs of infections, redness around the PEG site having bleeding at times  And bleeding when cleaned. Having some difficulty swallowing the mouth is sore, no throat changes and  taste changes. No mouth changes.   Encouraged to start using the sonafine to her neck skin with erythema with soreness to the left side of his neck.. Denies pain today.  Using sonafine bid to neck. Wt Readings from Last 3 Encounters:  01/27/16 142 lb 6.4 oz (64.6 kg)  01/20/16 141 lb 1.6 oz (64 kg)  01/17/16 135 lb 9.6 oz (61.5 kg)  BP (!) 144/64 (BP Location: Right Arm, Patient Position: Sitting, Cuff Size: Normal)   Pulse 88   Temp 97.9 F (36.6 C) (Oral)   Resp 18   Ht 5\' 9"  (1.753 m)   Wt 142 lb 6.4 oz (64.6 kg)   SpO2 97%   BMI 21.03 kg/m

## 2016-01-27 NOTE — Progress Notes (Signed)
01/27/2016  Patient Name:   [name] Date of Birth:   1931-01-10 Medical Record Number: [id number]  BP (!) 143/55   Pulse (!) 56   Temp 98.2 F (36.8 C)   [name] presents for oral examination during radiation therapy. Patient has completed 15/35 radiation treatments. No chemotherapy.  REVIEW OF CHIEF COMPLAINTS:  DRY MOUTH: Yes, sometimes. HARD TO SWALLOW: Yes, at times.  HURT TO SWALLOW: Yes, at times. TASTE CHANGES: Yes SORES IN MOUTH: No TRISMUS: No problems with trismus exercises. WEIGHT: 144 pounds down from initial and 170 pounds.  HOME OH REGIMEN:  BRUSHING: Twice a day FLOSSING: Rarely RINSING: Using salt water and baking soda rinses. FLUORIDE: At bedtime. TRISMUS EXERCISES:  Maximum interincisal opening: 33 mm   DENTAL EXAM:  Oral Hygiene:(PLAQUE): Good oral hygiene noted. LOCATION OF MUCOSITIS: Back of throat and left buccal mucosa. DESCRIPTION OF SALIVA: Decreased and thick saliva. Moderate xerostomia. ANY EXPOSED BONE: None noted OTHER WATCHED AREAS: Previous upper molar extraction sites. DX: Xerostomia, Dysgeusia, Dysphagia, Odynophagia and Mucositis  RECOMMENDATIONS: 1. Brush after meals and at bedtime.  Use fluoride at bedtime. 2. Use trismus exercises as directed. 3. Use Biotene Rinse or salt water/baking soda rinses. 4. Multiple sips of water as needed. 5. Return to clinic in two months for oral exam after radiation therapy. Call if problems before then.  Lenn Cal, DDS

## 2016-01-27 NOTE — Progress Notes (Addendum)
   Weekly Management Note:  Outpatient    ICD-9-CM ICD-10-CM   1. Carcinoma of tonsillar fossa (HCC) 146.1 C09.0 SONAFINE emulsion 1 application    Current Dose: 32 Gy  Projected Dose: 70 Gy   Narrative:  The patient presents for routine under treatment assessment.  CBCT/MVCT images/Port film x-rays were reviewed.  The chart was checked.   Mr Alikhan has received 16 fractions to left tonsil/tongue .  Has a feeding tube flushing it  with water 60 cc before and after feeding with Osmolite 1.2 cc per hour during the day, using 6 cans.    Having fatigue all day,  change PEG tube site is healing without signs of infections, redness around the PEG site having bleeding at times  And bleeding when cleaned. Having some difficulty swallowing the mouth is sore, no throat changes and  taste changes. No mouth changes.    Denies pain today.  Using sonafine bid to neck.   Physical Findings:  Wt Readings from Last 3 Encounters:  01/27/16 142 lb 6.4 oz (64.6 kg)  01/20/16 141 lb 1.6 oz (64 kg)  01/17/16 135 lb 9.6 oz (61.5 kg)    height is 5\' 9"  (1.753 m) and weight is 142 lb 6.4 oz (64.6 kg). His oral temperature is 97.9 F (36.6 C). His blood pressure is 144/64 (abnormal) and his pulse is 88. His respiration is 18 and oxygen saturation is 97%.    NAD ; thick oral secretions , no thrush Dry skin over neck. PEG site: bleeding at ostomy, not copious. No sign of infection.  Skin rash, mild, erythematous, around ostomy  CBC    Component Value Date/Time   WBC 6.2 01/01/2016 0750   RBC 4.13 (L) 01/01/2016 0750   HGB 13.8 01/01/2016 0750   HCT 41.3 01/01/2016 0750   PLT 205 01/01/2016 0750   MCV 100.0 01/01/2016 0750   MCH 33.4 01/01/2016 0750   MCHC 33.4 01/01/2016 0750   RDW 14.5 01/01/2016 0750   LYMPHSABS 1.2 01/01/2016 0750   MONOABS 0.8 01/01/2016 0750   EOSABS 0.3 01/01/2016 0750   BASOSABS 0.0 01/01/2016 0750     CMP     Component Value Date/Time   NA 136 01/01/2016 0750   K 4.1  01/01/2016 0750   CL 102 01/01/2016 0750   CO2 26 01/01/2016 0750   GLUCOSE 94 01/01/2016 0750   BUN 31 (H) 01/01/2016 0750   CREATININE 1.91 (H) 01/01/2016 0750   CALCIUM 9.5 01/01/2016 0750   PROT 3.3 (L) 07/04/2013 0408   ALBUMIN 1.4 (L) 07/04/2013 0408   AST 15 07/04/2013 0408   ALT 8 07/04/2013 0408   ALKPHOS 60 07/04/2013 0408   BILITOT 0.4 07/04/2013 0408   GFRNONAA 31 (L) 01/01/2016 0750   GFRAA 36 (L) 01/01/2016 0750     Impression:  The patient is tolerating radiotherapy.   Plan:  Continue radiotherapy as planned.  Gaining weight.  Neosporin at ostomy site, Aquaphor over  abdominal rash  sonafine over neck   -----------------------------------  Eppie Gibson, MD

## 2016-01-27 NOTE — Progress Notes (Signed)
Oncology Nurse Navigator Documentation  Met with [name] and his wife during XRT. She reported PEG tubing is being well secured by mesh brief, skin irritation from tape has resolved. She indicated skin under retention ring is red and irritated.  She recognized this is likely d/t persistent moisture.  I suggested she apply a thin coating of antibiotic cream to provide a moisture barrier.  She stated she would give that a try. They understand I can be contacted as needed.  Gayleen Orem, RN, BSN, [place] at [place] [phone]

## 2016-01-27 NOTE — Patient Instructions (Signed)
RECOMMENDATIONS: 1. Brush after meals and at bedtime.  Use fluoride at bedtime. 2. Use trismus exercises as directed. 3. Use Biotene Rinse or salt water/baking soda rinses. 4. Multiple sips of water as needed. 5. Return to clinic in two months for oral exam after radiation therapy. Call if problems before then.  Breyon Blass F. Zemirah Krasinski, DDS   

## 2016-01-28 ENCOUNTER — Ambulatory Visit
Admission: RE | Admit: 2016-01-28 | Discharge: 2016-01-28 | Disposition: A | Discharge status: Home / Self Care | Payer: Medicare Other | Source: Ambulatory Visit | Attending: Radiation Oncology | Admitting: Radiation Oncology

## 2016-01-28 ENCOUNTER — Encounter (HOSPITAL_COMMUNITY): Payer: Medicare Other

## 2016-01-28 ENCOUNTER — Encounter: Payer: Self-pay | Admitting: Hematology and Oncology

## 2016-01-28 ENCOUNTER — Telehealth: Payer: Self-pay | Admitting: *Deleted

## 2016-01-28 ENCOUNTER — Ambulatory Visit (HOSPITAL_BASED_OUTPATIENT_CLINIC_OR_DEPARTMENT_OTHER): Payer: Medicare Other | Admitting: Hematology and Oncology

## 2016-01-28 DIAGNOSIS — Z931 Gastrostomy status: Secondary | ICD-10-CM | POA: Diagnosis not present

## 2016-01-28 DIAGNOSIS — C029 Malignant neoplasm of tongue, unspecified: Secondary | ICD-10-CM

## 2016-01-28 DIAGNOSIS — Z952 Presence of prosthetic heart valve: Secondary | ICD-10-CM | POA: Diagnosis not present

## 2016-01-28 DIAGNOSIS — K5900 Constipation, unspecified: Secondary | ICD-10-CM | POA: Diagnosis not present

## 2016-01-28 DIAGNOSIS — Z510 Encounter for antineoplastic radiation therapy: Secondary | ICD-10-CM | POA: Diagnosis not present

## 2016-01-28 MED ORDER — POLYETHYLENE GLYCOL 3350 17 G PO PACK: 17.0000 g | PACK | Freq: Every day | ORAL | 1 refills | Status: AC

## 2016-01-28 MED ORDER — LACTULOSE 10 GM/15ML PO SOLN: 10.0000 g | Freq: Every day | ORAL | 1 refills | Status: AC

## 2016-01-28 MED FILL — POLYETHYLENE GLYCOL 3350: 15 days supply | Qty: 255 | Fill #0

## 2016-01-28 MED FILL — LACTULOSE 10 GM/15 ML SOLN: 10 | 16 days supply | Qty: 240 | Fill #0

## 2016-01-28 NOTE — Telephone Encounter (Signed)
Wife called to clarify how to take new Rx for Miralax and Lactulose.  Instructed wife for pt to take Miralax daily and Lactulose twice a day as needed for constipation.  Start both today.  Call nurse back if pt does not have BM by Thursday.  She verbalized understanding.

## 2016-01-28 NOTE — Assessment & Plan Note (Signed)
He will continue anticoagulation therapy and medical management

## 2016-01-28 NOTE — Assessment & Plan Note (Signed)
He has severe constipation for 1.5 weeks. I will start him on daily Miralax along with lactulose until he has bowel movement. We discussed the importance of getting constipation under control to avoid complications

## 2016-01-28 NOTE — Assessment & Plan Note (Signed)
He tolerated treatment well with expected side effects. I will continue to see him on a weekly basis for supportive care

## 2016-01-28 NOTE — Assessment & Plan Note (Signed)
He had recent weight loss. Since he started to use his feeding tube, he has gained some weight. He has very mild intermittent bleeding around the feeding tube site which I suspect is related to chronic anticoagulation therapy. I recommend conservative management for now

## 2016-01-28 NOTE — Progress Notes (Signed)
[place] OFFICE PROGRESS NOTE  Patient Care Team: Lajean Manes, MD as PCP - General (Internal Medicine) Newt Minion, MD as Consulting Physician (Orthopedic Surgery) Heath Lark, MD as Consulting Physician (Hematology and Oncology) Jerrell Belfast, MD as Consulting Physician (Otolaryngology) Penni Bombard, MD as Consulting Physician (Neurology) Leota Sauers, RN as Oncology Nurse Navigator Eppie Gibson, MD as Attending Physician (Radiation Oncology) Karie Mainland, RD as Dietitian (Nutrition) Jettie Booze, MD as Consulting Physician (Cardiology)  SUMMARY OF ONCOLOGIC HISTORY:   Tongue cancer ([place])   08/29/2015 Imaging    Outside ECHO at [facility]: NORMAL LEFT VENTRICULAR FUNCTION WITH MILD LVH, NORMAL RIGHT VENTRICULAR SYSTOLIC FUNCTION, VALVULAR REGURGITATION: MODERATE AR, MILD MR, MILD PR, MILD TR VALVULAR STENOSIS: MILD MS MILD-MODERATE PARAVALVULAR AORTIC REGURGITATION      09/26/2015 Miscellaneous    He was seen by Dr. Wilburn Cornelia. Mild tongue fullness was noted and CT was recommended      11/13/2015 Imaging    MRI orbit showed Interval growth of left tonsillar mass centered at the glossal tonsillar sulcus and extending superiorly along the target mandibular raphe. There is no definite intracranial extension. Left level 2 and level 3 adenopathy       11/19/2015 Procedure    He underwent left lymph node FNA with local anaethetics      11/19/2015 Pathology Results     [id number] Biopsy confirmed squamous cell carcinoma      12/10/2015 PET scan    Intensely hypermetabolic left palatine tonsil/left glossotonsillar sulcus/left posterior tongue mass, consistent with primary head and neck carcinoma. Hypermetabolic left level 2 and left level 3 cervical nodal metastases. No definite hypermetabolic metastatic disease in the chest, abdomen or pelvis.      01/01/2016 Procedure    Successful placement of a right IJ approach Power Port with ultrasound and  fluoroscopic guidance. The catheter is ready for use.      01/01/2016 Procedure    Successful placement of a 20 French pull-through gastrostomy tube.      01/06/2016 -  Radiation Therapy    The patient received radiation treatment only without chemotherapy due to multiple other comorbidities       INTERVAL HISTORY: Please see below for problem oriented charting. He is seen as part of his weekly supportive care visit. He has gained a bit of weight since he started to use a feeding tube consistently. Denies recent nausea or vomiting. He is constipated for 1.5 weeks with no bowel movement, has passage of flatus He continues to have mild intermittent bleeding around the feeding tube site  REVIEW OF SYSTEMS:   Constitutional: Denies fevers, chills or abnormal weight loss Eyes: Denies blurriness of vision Ears, nose, mouth, throat, and face: Denies mucositis or sore throat Respiratory: Denies cough, dyspnea or wheezes Cardiovascular: Denies palpitation, chest discomfort or lower extremity swelling Skin: Denies abnormal skin rashes Lymphatics: Denies new lymphadenopathy or easy bruising Neurological:Denies numbness, tingling or new weaknesses Behavioral/Psych: Mood is stable, no new changes  All other systems were reviewed with the patient and are negative.  I have reviewed the past medical history, past surgical history, social history and family history with the patient and they are unchanged from previous note.  ALLERGIES:  is allergic to sulfa antibiotics and ramipril.  MEDICATIONS:  Current Outpatient Prescriptions  Medication Sig Dispense Refill  . acetaminophen (TYLENOL) 500 MG tablet Take 500 mg by mouth every 6 (six) hours as needed.    Marland Kitchen allopurinol (ZYLOPRIM) 100 MG tablet Take  100 mg by mouth 2 (two) times daily.     Marland Kitchen amLODipine (NORVASC) 2.5 MG tablet TAKE 1 TABLET (2.5 MG TOTAL) BY MOUTH DAILY. 90 tablet 2  . apixaban (ELIQUIS) 2.5 MG TABS tablet Take 1 tablet (2.5 mg  total) by mouth 2 (two) times daily. 180 tablet 3  . atorvastatin (LIPITOR) 20 MG tablet Take 20 mg by mouth daily.    . citric acid-sodium citrate (ORACIT) 334-500 MG/5ML solution Take 15 mLs by mouth every morning. 1 Tablespoon Daily    . Cyanocobalamin (VITAMIN B-12 IJ) Inject 1 each as directed every 30 (thirty) days.    Mariane Baumgarten Sodium (COLACE PO) Take 1 tablet by mouth daily as needed (FOR CONTIPATION).    . famotidine (TH FAMOTIDINE 10) 10 MG tablet Take 10 mg by mouth every morning.    . furosemide (LASIX) 40 MG tablet Take 1 tablet (40 mg total) by mouth every other day. 45 tablet 1  . gabapentin (NEURONTIN) 300 MG capsule Take 1-2 capsules (300-600 mg total) by mouth 3 (three) times daily. 180 capsule 12  . lidocaine (XYLOCAINE) 2 % solution Swish and swallow 39mL of this mixture,12min before meals and at bedtime, up to QID 100 mL 5  . Nutritional Supplements (FEEDING SUPPLEMENT, OSMOLITE 1.5 CAL,) LIQD Osmolite 1.5 via PEG, 1 can QID with 60 cc free water before and after bolus feeding. Increase to 1.5 cans Osmolite 1.5 when tolerated. Patient should drink 24 oz water daily or put in PEG. 1422 mL 0  . promethazine (PHENERGAN) 25 MG tablet Take 1 tablet (25 mg total) by mouth every 6 (six) hours as needed for nausea or vomiting. 40 tablet 5  . sodium fluoride (FLUORISHIELD) 1.1 % GEL dental gel Instill one drop of gel per tooth space of fluoride tray. Place over teeth for 5 minutes. Remove. Spit out excess. Repeat nightly. 120 mL 11  . sucralfate (CARAFATE) 1 g tablet Dissolve 1 tablet in 72mL H2O and swallow up to QID,PRN sore throat. 40 tablet 5  . Tiotropium Bromide Monohydrate (SPIRIVA HANDIHALER IN) Inhale 2 puffs into the lungs daily.    . traMADol (ULTRAM) 50 MG tablet Take by mouth every 6 (six) hours as needed.    . Wound Dressings (SONAFINE EX) Apply topically.    . lactulose (CHRONULAC) 10 GM/15ML solution Place 15 mLs (10 g total) into feeding tube daily. 240 mL 1  .  polyethylene glycol (MIRALAX) packet Take 17 g by mouth daily. 14 each 1   No current facility-administered medications for this visit.     PHYSICAL EXAMINATION: ECOG PERFORMANCE STATUS: 1 - Symptomatic but completely ambulatory  Vitals:   01/28/16 1230  BP: (!) 118/46  Pulse: 88  Resp: 17  Temp: 98.2 F (36.8 C)   Filed Weights   01/28/16 1230  Weight: 143 lb 8 oz (65.1 kg)    GENERAL:alert, no distress and comfortable SKIN: skin color, texture, turgor are normal, no rashes or significant lesions EYES: normal, Conjunctiva are pink and non-injected, sclera clear OROPHARYNX: Dry mucous membrane is noted. No thrush NECK: supple, thyroid normal size, non-tender, without nodularity LYMPH:  Previously palpable lymphadenopathy has regressed in size LUNGS: clear to auscultation and percussion with normal breathing effort HEART: Irregular rate and rhythm, no murmurs ABDOMEN:abdomen soft, non-tender and reduced bowel sounds. Feeding tube site looks okay Musculoskeletal:no cyanosis of digits and no clubbing  NEURO: alert & oriented x 3 with fluent speech, no focal motor/sensory deficits  LABORATORY DATA:  I have  reviewed the data as listed    Component Value Date/Time   NA 136 01/01/2016 0750   K 4.1 01/01/2016 0750   CL 102 01/01/2016 0750   CO2 26 01/01/2016 0750   GLUCOSE 94 01/01/2016 0750   BUN 31 (H) 01/01/2016 0750   CREATININE 1.91 (H) 01/01/2016 0750   CALCIUM 9.5 01/01/2016 0750   PROT 3.3 (L) 07/04/2013 0408   ALBUMIN 1.4 (L) 07/04/2013 0408   AST 15 07/04/2013 0408   ALT 8 07/04/2013 0408   ALKPHOS 60 07/04/2013 0408   BILITOT 0.4 07/04/2013 0408   GFRNONAA 31 (L) 01/01/2016 0750   GFRAA 36 (L) 01/01/2016 0750    No results found for: SPEP, UPEP  Lab Results  Component Value Date   WBC 6.2 01/01/2016   NEUTROABS 3.8 01/01/2016   HGB 13.8 01/01/2016   HCT 41.3 01/01/2016   MCV 100.0 01/01/2016   PLT 205 01/01/2016      Chemistry      Component  Value Date/Time   NA 136 01/01/2016 0750   K 4.1 01/01/2016 0750   CL 102 01/01/2016 0750   CO2 26 01/01/2016 0750   BUN 31 (H) 01/01/2016 0750   CREATININE 1.91 (H) 01/01/2016 0750      Component Value Date/Time   CALCIUM 9.5 01/01/2016 0750   ALKPHOS 60 07/04/2013 0408   AST 15 07/04/2013 0408   ALT 8 07/04/2013 0408   BILITOT 0.4 07/04/2013 0408      ASSESSMENT & PLAN:  Tongue cancer ([place]) He tolerated treatment well with expected side effects. I will continue to see him on a weekly basis for supportive care  S/P gastrostomy ([place]) He had recent weight loss. Since he started to use his feeding tube, he has gained some weight. He has very mild intermittent bleeding around the feeding tube site which I suspect is related to chronic anticoagulation therapy. I recommend conservative management for now  S/P aortic valve replacement He will continue anticoagulation therapy and medical management  Acute constipation He has severe constipation for 1.5 weeks. I will start him on daily Miralax along with lactulose until he has bowel movement. We discussed the importance of getting constipation under control to avoid complications   No orders of the defined types were placed in this encounter.  All questions were answered. The patient knows to call the clinic with any problems, questions or concerns. No barriers to learning was detected. I spent 15 minutes counseling the patient face to face. The total time spent in the appointment was 20 minutes and more than 50% was on counseling and review of test results     Heath Lark, MD 01/28/2016 1:11 PM

## 2016-01-29 ENCOUNTER — Ambulatory Visit
Admission: RE | Admit: 2016-01-29 | Discharge: 2016-01-29 | Disposition: A | Discharge status: Home / Self Care | Payer: Medicare Other | Source: Ambulatory Visit | Attending: Radiation Oncology | Admitting: Radiation Oncology

## 2016-01-29 ENCOUNTER — Encounter (HOSPITAL_COMMUNITY): Payer: Medicare Other

## 2016-01-29 DIAGNOSIS — Z510 Encounter for antineoplastic radiation therapy: Secondary | ICD-10-CM | POA: Diagnosis not present

## 2016-01-30 ENCOUNTER — Encounter (HOSPITAL_COMMUNITY): Payer: Medicare Other

## 2016-01-30 ENCOUNTER — Encounter: Payer: Self-pay | Admitting: *Deleted

## 2016-01-30 ENCOUNTER — Ambulatory Visit
Admission: RE | Admit: 2016-01-30 | Discharge: 2016-01-30 | Disposition: A | Discharge status: Home / Self Care | Payer: Medicare Other | Source: Ambulatory Visit | Attending: Radiation Oncology | Admitting: Radiation Oncology

## 2016-01-30 ENCOUNTER — Ambulatory Visit: Payer: Medicare Other | Admitting: Nutrition

## 2016-01-30 DIAGNOSIS — Z510 Encounter for antineoplastic radiation therapy: Secondary | ICD-10-CM | POA: Diagnosis not present

## 2016-01-30 NOTE — Progress Notes (Signed)
Oncology Nurse Navigator Documentation  Met with Mr. Borgmeyer and his wife prior to his Tomo treatment. She reported he had BM yesterday after 2 doses of lactulose, 1 dose Miralax and a stool softner.   He stated he is now having some diarrhea and hasn't taken anything today. I encouraged him to initiate daily Miralax if no BM in three days.  They voiced understanding.  Gayleen Orem, RN, BSN, [place] at [place] [phone]

## 2016-01-30 NOTE — Progress Notes (Signed)
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 Weight was documented as 143.5 pounds on October 10. Patient is tolerating Osmolite 1.5 via PEG 1-1/2 cans 4 times a day with 60 cc free water for and after boluses He is drinking a lot of water by mouth but can also flush water through his tube. Patient states he would like to try some soft foods. Noted patient had a bowel movement on October 11 after two lactulose, 1 MiraLAX and stool softener.  Nutrition diagnosis: Malnutrition improved.  Intervention: Patient will continue Osmolite 1.5 at goal rate of 6 and 1/2 cans 4 times a day with 60 cc free water before and after bolus feeding. In addition patient will consume 3 - 8 ounce glasses of water daily or infuse through PEG. He will continue to consume soft moist foods as tolerated. Questions were answered.  Teach back method used.  6 cans Osmolite 1.5+ free water flushes provide 2130 cal, 89.4 g protein, 2286 mL free water which is greater than 90% of estimated nutrition needs.  Next visit: Thursday, October 19 after radiation therapy.  **Disclaimer: This note was dictated with voice recognition software. Similar sounding words can inadvertently be transcribed and this note may contain transcription errors which may not have been corrected upon publication of note.**

## 2016-01-31 ENCOUNTER — Encounter: Payer: Self-pay | Admitting: *Deleted

## 2016-01-31 ENCOUNTER — Ambulatory Visit
Admission: RE | Admit: 2016-01-31 | Discharge: 2016-01-31 | Disposition: A | Discharge status: Home / Self Care | Payer: Medicare Other | Source: Ambulatory Visit | Attending: Radiation Oncology | Admitting: Radiation Oncology

## 2016-01-31 DIAGNOSIS — Z510 Encounter for antineoplastic radiation therapy: Secondary | ICD-10-CM | POA: Diagnosis not present

## 2016-01-31 NOTE — Progress Notes (Signed)
Oncology Nurse Navigator Documentation  Met with Mr. Beitler during weekly UT with Dr. Isidore Moos to assess PEG insertion site, change dressing.   No signs/symptoms of infection. Scant bleeding continues at insertion site r/t blood thinner. I encouraged frequent dressing changes.  Gayleen Orem, RN, BSN, [place] at [place] [phone]

## 2016-02-03 ENCOUNTER — Encounter: Payer: Self-pay | Admitting: Radiation Oncology

## 2016-02-03 ENCOUNTER — Ambulatory Visit
Admission: RE | Admit: 2016-02-03 | Discharge: 2016-02-03 | Disposition: A | Discharge status: Home / Self Care | Payer: Medicare Other | Source: Ambulatory Visit | Attending: Radiation Oncology | Admitting: Radiation Oncology

## 2016-02-03 VITALS — BP 130/52 | HR 88 | Temp 98.5°F | Resp 10 | Wt 148.8 lb

## 2016-02-03 DIAGNOSIS — Z510 Encounter for antineoplastic radiation therapy: Secondary | ICD-10-CM | POA: Diagnosis not present

## 2016-02-03 DIAGNOSIS — C090 Malignant neoplasm of tonsillar fossa: Secondary | ICD-10-CM

## 2016-02-03 NOTE — Progress Notes (Signed)
PAIN: He is currently in no pain.  SWALLOWING/DIET: Pt has had dysphagia for liquids. Pt reports a regular diet plus nutritonal beverage given orally, takes in very little food via mouth.  Tube feed formula-osmolite, 6 cans per day, 60 ml H20 flush before and after feeding x 4 times a day. Peg tube site appearance- red, tender, bleeding moderate amount of bright red bleeding. Oral exam reveals mild erythema and mucous membranes moist with white and tenacious sputum.  BOWEL: Pt reports a bowel movement every day. He has multiple bouts of diarrhea since taking medication to manage constipation.  He has now cut back on the Miralax SKIN: Skin exam reveals Erythema, Hyperpigmentation, dry desquamation and erythema. Pt continues to apply Radiaplex as directed. OTHER: Pt complains of fatigue and poor appetite.  Complaints of shortness of breath this morning.  Room air WEIGHT/VS: BP (!) 130/52   Pulse 88   Temp 98.5 F (36.9 C) (Oral)   Resp 10   Wt 148 lb 12.8 oz (67.5 kg)   SpO2 92%   BMI 21.97 kg/m  Wt Readings from Last 3 Encounters:  02/03/16 148 lb 12.8 oz (67.5 kg)  01/28/16 143 lb 8 oz (65.1 kg)  01/27/16 142 lb 6.4 oz (64.6 kg)   Orthostatic:standing BP: 150/44, P: 64, Pox: 93%

## 2016-02-03 NOTE — Progress Notes (Addendum)
   Weekly Management Note:  Outpatient    ICD-9-CM ICD-10-CM   1. Carcinoma of tonsillar fossa (HCC) 146.1 C09.0     Current Dose: 42 Gy  Projected Dose: 70 Gy   Narrative:  The patient presents for routine under treatment assessment.  CBCT/MVCT images/Port film x-rays were reviewed.  The chart was checked.  Denies pain.  Weight increased.  Drinking water, PEG for nutrition.  Bowel regimen working well now.  Poor appetitive.  Fatigue.  Uses Sonafine.   Pt complains of fatigue and poor appetite.      Physical Findings:  Wt Readings from Last 3 Encounters:  02/03/16 148 lb 12.8 oz (67.5 kg)  01/28/16 143 lb 8 oz (65.1 kg)  01/27/16 142 lb 6.4 oz (64.6 kg)    weight is 148 lb 12.8 oz (67.5 kg). His oral temperature is 98.5 F (36.9 C). His blood pressure is 130/52 (abnormal) and his pulse is 88. His respiration is 10 and oxygen saturation is 92%.    NAD ; dry mouth,  no thrush Dry skin over neck. Left upper neck mass slightly smaller than consult PEG site: bleeding at ostomy, not copious. No sign of infection.      CBC    Component Value Date/Time   WBC 6.2 01/01/2016 0750   RBC 4.13 (L) 01/01/2016 0750   HGB 13.8 01/01/2016 0750   HCT 41.3 01/01/2016 0750   PLT 205 01/01/2016 0750   MCV 100.0 01/01/2016 0750   MCH 33.4 01/01/2016 0750   MCHC 33.4 01/01/2016 0750   RDW 14.5 01/01/2016 0750   LYMPHSABS 1.2 01/01/2016 0750   MONOABS 0.8 01/01/2016 0750   EOSABS 0.3 01/01/2016 0750   BASOSABS 0.0 01/01/2016 0750     CMP     Component Value Date/Time   NA 136 01/01/2016 0750   K 4.1 01/01/2016 0750   CL 102 01/01/2016 0750   CO2 26 01/01/2016 0750   GLUCOSE 94 01/01/2016 0750   BUN 31 (H) 01/01/2016 0750   CREATININE 1.91 (H) 01/01/2016 0750   CALCIUM 9.5 01/01/2016 0750   PROT 3.3 (L) 07/04/2013 0408   ALBUMIN 1.4 (L) 07/04/2013 0408   AST 15 07/04/2013 0408   ALT 8 07/04/2013 0408   ALKPHOS 60 07/04/2013 0408   BILITOT 0.4 07/04/2013 0408   GFRNONAA 31 (L)  01/01/2016 0750   GFRAA 36 (L) 01/01/2016 0750     Impression:  The patient is tolerating radiotherapy.   Plan:  Continue radiotherapy as planned. Good nutrition, Gaining weight. Continue sonafine over neck .    -----------------------------------  Eppie Gibson, MD

## 2016-02-04 ENCOUNTER — Ambulatory Visit (HOSPITAL_BASED_OUTPATIENT_CLINIC_OR_DEPARTMENT_OTHER): Payer: Medicare Other | Admitting: Hematology and Oncology

## 2016-02-04 ENCOUNTER — Encounter (HOSPITAL_COMMUNITY): Payer: Medicare Other

## 2016-02-04 ENCOUNTER — Encounter: Payer: Self-pay | Admitting: Hematology and Oncology

## 2016-02-04 ENCOUNTER — Ambulatory Visit
Admission: RE | Admit: 2016-02-04 | Discharge: 2016-02-04 | Disposition: A | Discharge status: Home / Self Care | Payer: Medicare Other | Source: Ambulatory Visit | Attending: Radiation Oncology | Admitting: Radiation Oncology

## 2016-02-04 DIAGNOSIS — Z952 Presence of prosthetic heart valve: Secondary | ICD-10-CM | POA: Diagnosis not present

## 2016-02-04 DIAGNOSIS — Z931 Gastrostomy status: Secondary | ICD-10-CM | POA: Diagnosis not present

## 2016-02-04 DIAGNOSIS — C029 Malignant neoplasm of tongue, unspecified: Secondary | ICD-10-CM | POA: Diagnosis present

## 2016-02-04 DIAGNOSIS — Z510 Encounter for antineoplastic radiation therapy: Secondary | ICD-10-CM | POA: Diagnosis not present

## 2016-02-04 NOTE — Assessment & Plan Note (Signed)
He had recent weight loss. Since he started to use his feeding tube, he has gained some weight. He has very mild intermittent bleeding around the feeding tube site which I suspect is related to chronic anticoagulation therapy. I recommend conservative management for now

## 2016-02-04 NOTE — Progress Notes (Signed)
[place] OFFICE PROGRESS NOTE  Patient Care Team: Lajean Manes, MD as PCP - General (Internal Medicine) Newt Minion, MD as Consulting Physician (Orthopedic Surgery) Heath Lark, MD as Consulting Physician (Hematology and Oncology) Jerrell Belfast, MD as Consulting Physician (Otolaryngology) Penni Bombard, MD as Consulting Physician (Neurology) Leota Sauers, RN as Oncology Nurse Navigator Eppie Gibson, MD as Attending Physician (Radiation Oncology) Karie Mainland, RD as Dietitian (Nutrition) Jettie Booze, MD as Consulting Physician (Cardiology)  SUMMARY OF ONCOLOGIC HISTORY:   Tongue cancer ([place])   08/29/2015 Imaging    Outside ECHO at [facility]: NORMAL LEFT VENTRICULAR FUNCTION WITH MILD LVH, NORMAL RIGHT VENTRICULAR SYSTOLIC FUNCTION, VALVULAR REGURGITATION: MODERATE AR, MILD MR, MILD PR, MILD TR VALVULAR STENOSIS: MILD MS MILD-MODERATE PARAVALVULAR AORTIC REGURGITATION      09/26/2015 Miscellaneous    He was seen by Dr. Wilburn Cornelia. Mild tongue fullness was noted and CT was recommended      11/13/2015 Imaging    MRI orbit showed Interval growth of left tonsillar mass centered at the glossal tonsillar sulcus and extending superiorly along the target mandibular raphe. There is no definite intracranial extension. Left level 2 and level 3 adenopathy       11/19/2015 Procedure    He underwent left lymph node FNA with local anaethetics      11/19/2015 Pathology Results     [id number] Biopsy confirmed squamous cell carcinoma      12/10/2015 PET scan    Intensely hypermetabolic left palatine tonsil/left glossotonsillar sulcus/left posterior tongue mass, consistent with primary head and neck carcinoma. Hypermetabolic left level 2 and left level 3 cervical nodal metastases. No definite hypermetabolic metastatic disease in the chest, abdomen or pelvis.      01/01/2016 Procedure    Successful placement of a right IJ approach Power Port with ultrasound and  fluoroscopic guidance. The catheter is ready for use.      01/01/2016 Procedure    Successful placement of a 20 French pull-through gastrostomy tube.      01/06/2016 -  Radiation Therapy    The patient received radiation treatment only without chemotherapy due to multiple other comorbidities       INTERVAL HISTORY: Please see below for problem oriented charting. He feels well. He is gaining weight. Constipation has resolved. He is able to tolerate 6 cans of nutritional supplement per day along with water flushes. He continues to notice intermittent mild bleeding around the feeding tube Denies nausea. His pain appears to be well controlled  REVIEW OF SYSTEMS:   Constitutional: Denies fevers, chills or abnormal weight loss Eyes: Denies blurriness of vision Ears, nose, mouth, throat, and face: Denies mucositis or sore throat Respiratory: Denies cough, dyspnea or wheezes Cardiovascular: Denies palpitation, chest discomfort or lower extremity swelling Gastrointestinal:  Denies nausea, heartburn or change in bowel habits Skin: Denies abnormal skin rashes Lymphatics: Denies new lymphadenopathy or easy bruising Neurological:Denies numbness, tingling or new weaknesses Behavioral/Psych: Mood is stable, no new changes  All other systems were reviewed with the patient and are negative.  I have reviewed the past medical history, past surgical history, social history and family history with the patient and they are unchanged from previous note.  ALLERGIES:  is allergic to sulfa antibiotics and ramipril.  MEDICATIONS:  Current Outpatient Prescriptions  Medication Sig Dispense Refill  . acetaminophen (TYLENOL) 500 MG tablet Take 500 mg by mouth every 6 (six) hours as needed.    Marland Kitchen allopurinol (ZYLOPRIM) 100 MG tablet Take 100 mg  by mouth 2 (two) times daily.     Marland Kitchen amLODipine (NORVASC) 2.5 MG tablet TAKE 1 TABLET (2.5 MG TOTAL) BY MOUTH DAILY. 90 tablet 2  . apixaban (ELIQUIS) 2.5 MG TABS  tablet Take 1 tablet (2.5 mg total) by mouth 2 (two) times daily. 180 tablet 3  . atorvastatin (LIPITOR) 20 MG tablet Take 20 mg by mouth daily.    . citric acid-sodium citrate (ORACIT) 334-500 MG/5ML solution Take 15 mLs by mouth every morning. 1 Tablespoon Daily    . Cyanocobalamin (VITAMIN B-12 IJ) Inject 1 each as directed every 30 (thirty) days.    . famotidine (TH FAMOTIDINE 10) 10 MG tablet Take 10 mg by mouth every morning.    . furosemide (LASIX) 40 MG tablet Take 1 tablet (40 mg total) by mouth every other day. 45 tablet 1  . gabapentin (NEURONTIN) 300 MG capsule Take 1-2 capsules (300-600 mg total) by mouth 3 (three) times daily. 180 capsule 12  . Nutritional Supplements (FEEDING SUPPLEMENT, OSMOLITE 1.5 CAL,) LIQD Osmolite 1.5 via PEG, 1 can QID with 60 cc free water before and after bolus feeding. Increase to 1.5 cans Osmolite 1.5 when tolerated. Patient should drink 24 oz water daily or put in PEG. 1422 mL 0  . sodium fluoride (FLUORISHIELD) 1.1 % GEL dental gel Instill one drop of gel per tooth space of fluoride tray. Place over teeth for 5 minutes. Remove. Spit out excess. Repeat nightly. 120 mL 11  . Tiotropium Bromide Monohydrate (SPIRIVA HANDIHALER IN) Inhale 2 puffs into the lungs daily.    . Wound Dressings (SONAFINE EX) Apply topically.    Mariane Baumgarten Sodium (COLACE PO) Take 1 tablet by mouth daily as needed (FOR CONTIPATION).    Marland Kitchen lactulose (CHRONULAC) 10 GM/15ML solution Place 15 mLs (10 g total) into feeding tube daily. (Patient not taking: Reported on 02/04/2016) 240 mL 1  . lidocaine (XYLOCAINE) 2 % solution Swish and swallow 57mL of this mixture,49min before meals and at bedtime, up to QID (Patient not taking: Reported on 02/04/2016) 100 mL 5  . polyethylene glycol (MIRALAX) packet Take 17 g by mouth daily. (Patient not taking: Reported on 02/04/2016) 14 each 1  . promethazine (PHENERGAN) 25 MG tablet Take 1 tablet (25 mg total) by mouth every 6 (six) hours as needed for  nausea or vomiting. (Patient not taking: Reported on 02/04/2016) 40 tablet 5  . sucralfate (CARAFATE) 1 g tablet Dissolve 1 tablet in 70mL H2O and swallow up to QID,PRN sore throat. (Patient not taking: Reported on 02/04/2016) 40 tablet 5  . traMADol (ULTRAM) 50 MG tablet Take by mouth every 6 (six) hours as needed.     No current facility-administered medications for this visit.     PHYSICAL EXAMINATION: ECOG PERFORMANCE STATUS: 1 - Symptomatic but completely ambulatory  Vitals:   02/04/16 1218  BP: 139/61  Pulse: 84  Resp: 17  Temp: 97.6 F (36.4 C)   Filed Weights   02/04/16 1218  Weight: 148 lb 1.6 oz (67.2 kg)    GENERAL:alert, no distress and comfortable SKIN: Noted very mild skin rash around his neck EYES: normal, Conjunctiva are pink and non-injected, sclera clear OROPHARYNX:no exudate, no erythema and lips, buccal mucosa, and tongue normal  NECK: supple, thyroid normal size, non-tender, without nodularity LYMPH:  no palpable lymphadenopathy in the cervical, axillary or inguinal LUNGS: clear to auscultation and percussion with normal breathing effort HEART: Irregular rate and rhythm with systolic click ABDOMEN:abdomen soft, non-tender and normal bowel sounds. Feeding  tube site looks okay without signs of bleeding Musculoskeletal:no cyanosis of digits and no clubbing  NEURO: alert & oriented x 3 with fluent speech, no focal motor/sensory deficits  LABORATORY DATA:  I have reviewed the data as listed    Component Value Date/Time   NA 136 01/01/2016 0750   K 4.1 01/01/2016 0750   CL 102 01/01/2016 0750   CO2 26 01/01/2016 0750   GLUCOSE 94 01/01/2016 0750   BUN 31 (H) 01/01/2016 0750   CREATININE 1.91 (H) 01/01/2016 0750   CALCIUM 9.5 01/01/2016 0750   PROT 3.3 (L) 07/04/2013 0408   ALBUMIN 1.4 (L) 07/04/2013 0408   AST 15 07/04/2013 0408   ALT 8 07/04/2013 0408   ALKPHOS 60 07/04/2013 0408   BILITOT 0.4 07/04/2013 0408   GFRNONAA 31 (L) 01/01/2016 0750    GFRAA 36 (L) 01/01/2016 0750    No results found for: SPEP, UPEP  Lab Results  Component Value Date   WBC 6.2 01/01/2016   NEUTROABS 3.8 01/01/2016   HGB 13.8 01/01/2016   HCT 41.3 01/01/2016   MCV 100.0 01/01/2016   PLT 205 01/01/2016      Chemistry      Component Value Date/Time   NA 136 01/01/2016 0750   K 4.1 01/01/2016 0750   CL 102 01/01/2016 0750   CO2 26 01/01/2016 0750   BUN 31 (H) 01/01/2016 0750   CREATININE 1.91 (H) 01/01/2016 0750      Component Value Date/Time   CALCIUM 9.5 01/01/2016 0750   ALKPHOS 60 07/04/2013 0408   AST 15 07/04/2013 0408   ALT 8 07/04/2013 0408   BILITOT 0.4 07/04/2013 0408      ASSESSMENT & PLAN:  Tongue cancer ([place]) He tolerated treatment well with expected side effects. I will continue to see him on a weekly basis for supportive care  S/P gastrostomy ([place]) He had recent weight loss. Since he started to use his feeding tube, he has gained some weight. He has very mild intermittent bleeding around the feeding tube site which I suspect is related to chronic anticoagulation therapy. I recommend conservative management for now  S/P aortic valve replacement He will continue anticoagulation therapy and medical management   No orders of the defined types were placed in this encounter.  All questions were answered. The patient knows to call the clinic with any problems, questions or concerns. No barriers to learning was detected. I spent 15 minutes counseling the patient face to face. The total time spent in the appointment was 20 minutes and more than 50% was on counseling and review of test results     Heath Lark, MD 02/04/2016 1:46 PM

## 2016-02-04 NOTE — Assessment & Plan Note (Signed)
He will continue anticoagulation therapy and medical management

## 2016-02-04 NOTE — Assessment & Plan Note (Signed)
He tolerated treatment well with expected side effects. I will continue to see him on a weekly basis for supportive care

## 2016-02-05 ENCOUNTER — Encounter (HOSPITAL_COMMUNITY): Payer: Medicare Other

## 2016-02-05 ENCOUNTER — Ambulatory Visit
Admission: RE | Admit: 2016-02-05 | Discharge: 2016-02-05 | Disposition: A | Discharge status: Home / Self Care | Payer: Medicare Other | Source: Ambulatory Visit | Attending: Radiation Oncology | Admitting: Radiation Oncology

## 2016-02-05 DIAGNOSIS — Z510 Encounter for antineoplastic radiation therapy: Secondary | ICD-10-CM | POA: Diagnosis not present

## 2016-02-06 ENCOUNTER — Ambulatory Visit: Payer: Medicare Other | Admitting: Nutrition

## 2016-02-06 ENCOUNTER — Ambulatory Visit
Admission: RE | Admit: 2016-02-06 | Discharge: 2016-02-06 | Disposition: A | Discharge status: Home / Self Care | Payer: Medicare Other | Source: Ambulatory Visit | Attending: Radiation Oncology | Admitting: Radiation Oncology

## 2016-02-06 ENCOUNTER — Encounter (HOSPITAL_COMMUNITY): Payer: Medicare Other

## 2016-02-06 DIAGNOSIS — Z510 Encounter for antineoplastic radiation therapy: Secondary | ICD-10-CM | POA: Diagnosis not present

## 2016-02-06 NOTE — Progress Notes (Signed)
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 Weight improved documented as 148.1 pounds on October 17.  Increased from 143.5 pounds October 10. Patient is tolerating Osmolite 1.5 via PEG 1-1/2 cans 4 times a day with 60 cc free water before and after boluses He continues to drink water by mouth. He reports he enjoyed eating pickled beets and sauerkraut and mashed potatoes. Patient reports he still is a bit constipated.  Nutrition diagnosis: Malnutrition improved.  Intervention: Patient will continue to eat as tolerated. Patient will try samples of Jevity 1.5 to add additional fiber to his diet to help with constipation. Patient will begin substituting one can of Jevity 1.5 for one can of Osmolite 1.5.  Goal rate will be 3 cans Osmolite 1.5+3 cans Jevity 1.5 with the same amount of free water to provide greater than 90% of estimated nutrition needs. Patient was provided with samples of Jevity 1.5. Questions were answered.  Teach back method was used.  Monitoring, evaluation, goals: Patient will continue to tolerate tube feeding plus oral intake to minimize weight loss.  Next visit: Wednesday, October 25, before radiation therapy.  **Disclaimer: This note was dictated with voice recognition software. Similar sounding words can inadvertently be transcribed and this note may contain transcription errors which may not have been corrected upon publication of note.**

## 2016-02-07 ENCOUNTER — Telehealth: Payer: Self-pay | Admitting: *Deleted

## 2016-02-07 ENCOUNTER — Ambulatory Visit (HOSPITAL_BASED_OUTPATIENT_CLINIC_OR_DEPARTMENT_OTHER): Payer: Medicare Other | Admitting: Hematology and Oncology

## 2016-02-07 ENCOUNTER — Ambulatory Visit
Admission: RE | Admit: 2016-02-07 | Discharge: 2016-02-07 | Disposition: A | Discharge status: Home / Self Care | Payer: Medicare Other | Source: Ambulatory Visit | Attending: Radiation Oncology | Admitting: Radiation Oncology

## 2016-02-07 ENCOUNTER — Other Ambulatory Visit: Payer: Self-pay | Admitting: Hematology and Oncology

## 2016-02-07 ENCOUNTER — Ambulatory Visit: Payer: Medicare Other

## 2016-02-07 ENCOUNTER — Encounter: Payer: Self-pay | Admitting: Hematology and Oncology

## 2016-02-07 DIAGNOSIS — N183 Chronic kidney disease, stage 3 unspecified: Secondary | ICD-10-CM

## 2016-02-07 DIAGNOSIS — Z510 Encounter for antineoplastic radiation therapy: Secondary | ICD-10-CM | POA: Diagnosis not present

## 2016-02-07 DIAGNOSIS — D509 Iron deficiency anemia, unspecified: Secondary | ICD-10-CM

## 2016-02-07 DIAGNOSIS — R040 Epistaxis: Secondary | ICD-10-CM | POA: Insufficient documentation

## 2016-02-07 DIAGNOSIS — E440 Moderate protein-calorie malnutrition: Secondary | ICD-10-CM | POA: Diagnosis not present

## 2016-02-07 DIAGNOSIS — C029 Malignant neoplasm of tongue, unspecified: Secondary | ICD-10-CM

## 2016-02-07 DIAGNOSIS — K922 Gastrointestinal hemorrhage, unspecified: Secondary | ICD-10-CM

## 2016-02-07 LAB — CBC WITH DIFFERENTIAL/PLATELET
BASO%: 0.7 % (ref 0.0–2.0)
BASOS ABS: 0 10*3/uL (ref 0.0–0.1)
EOS%: 4.2 % (ref 0.0–7.0)
Eosinophils Absolute: 0.3 10*3/uL (ref 0.0–0.5)
HEMATOCRIT: 32.5 % — AB (ref 38.4–49.9)
HEMOGLOBIN: 10.5 g/dL — AB (ref 13.0–17.1)
LYMPH#: 0.4 10*3/uL — AB (ref 0.9–3.3)
LYMPH%: 6.1 % — ABNORMAL LOW (ref 14.0–49.0)
MCH: 33 pg (ref 27.2–33.4)
MCHC: 32.4 g/dL (ref 32.0–36.0)
MCV: 101.8 fL — ABNORMAL HIGH (ref 79.3–98.0)
MONO#: 0.9 10*3/uL (ref 0.1–0.9)
MONO%: 14.1 % — ABNORMAL HIGH (ref 0.0–14.0)
NEUT#: 4.9 10*3/uL (ref 1.5–6.5)
NEUT%: 74.9 % (ref 39.0–75.0)
PLATELETS: 215 10*3/uL (ref 140–400)
RBC: 3.19 10*6/uL — ABNORMAL LOW (ref 4.20–5.82)
RDW: 18.1 % — AB (ref 11.0–14.6)
WBC: 6.5 10*3/uL (ref 4.0–10.3)

## 2016-02-07 LAB — COMPREHENSIVE METABOLIC PANEL WITH GFR
ALT: 27 U/L (ref 0–55)
AST: 34 U/L (ref 5–34)
Albumin: 2.8 g/dL — ABNORMAL LOW (ref 3.5–5.0)
Alkaline Phosphatase: 150 U/L (ref 40–150)
Anion Gap: 11 meq/L (ref 3–11)
BUN: 76.1 mg/dL — ABNORMAL HIGH (ref 7.0–26.0)
CO2: 29 meq/L (ref 22–29)
Calcium: 9.2 mg/dL (ref 8.4–10.4)
Chloride: 104 meq/L (ref 98–109)
Creatinine: 1.9 mg/dL — ABNORMAL HIGH (ref 0.7–1.3)
EGFR: 32 ml/min/1.73 m2 — ABNORMAL LOW
Glucose: 113 mg/dL (ref 70–140)
Potassium: 5 meq/L (ref 3.5–5.1)
Sodium: 145 meq/L (ref 136–145)
Total Bilirubin: 0.92 mg/dL (ref 0.20–1.20)
Total Protein: 7.3 g/dL (ref 6.4–8.3)

## 2016-02-07 LAB — TSH: TSH: 0.545 m(IU)/L (ref 0.320–4.118)

## 2016-02-07 LAB — FERRITIN: FERRITIN: 356 ng/mL — AB (ref 22–316)

## 2016-02-07 NOTE — Assessment & Plan Note (Signed)
He tolerated treatment well with expected side effects. I will continue to see him on a weekly basis for supportive care

## 2016-02-07 NOTE — Telephone Encounter (Signed)
Wife called and left message that patient had nosebleed this am and it is still bleeding a little bit.  Patient has radiation treatment at 11:45am.  Anise Salvo RN with Dr.Squires in Radiation to let her know.   Called wife back - no answer but left message to pinch nostrils tightly  and have him lean forward. Let her know that sometimes it takes a while to stop the bleeding with nosebleeds.

## 2016-02-07 NOTE — Assessment & Plan Note (Signed)
This is mild-to-moderate in nature. The bleeding has slowed down. I recommend he hold anticoagulation therapy for minimum 24 hours before resumption We discussed strategy including compression to the nasal passage. If the bleeding continues to be severe, I recommend him to go to the emergency department for further evaluation and management

## 2016-02-07 NOTE — Progress Notes (Signed)
[place] OFFICE PROGRESS NOTE  Patient Care Team: Lajean Manes, MD as PCP - General (Internal Medicine) Newt Minion, MD as Consulting Physician (Orthopedic Surgery) Heath Lark, MD as Consulting Physician (Hematology and Oncology) Jerrell Belfast, MD as Consulting Physician (Otolaryngology) Penni Bombard, MD as Consulting Physician (Neurology) Leota Sauers, RN as Oncology Nurse Navigator Eppie Gibson, MD as Attending Physician (Radiation Oncology) Karie Mainland, RD as Dietitian (Nutrition) Jettie Booze, MD as Consulting Physician (Cardiology)  SUMMARY OF ONCOLOGIC HISTORY:   Tongue cancer ([place])   08/29/2015 Imaging    Outside ECHO at [facility]: NORMAL LEFT VENTRICULAR FUNCTION WITH MILD LVH, NORMAL RIGHT VENTRICULAR SYSTOLIC FUNCTION, VALVULAR REGURGITATION: MODERATE AR, MILD MR, MILD PR, MILD TR VALVULAR STENOSIS: MILD MS MILD-MODERATE PARAVALVULAR AORTIC REGURGITATION      09/26/2015 Miscellaneous    He was seen by Dr. Wilburn Cornelia. Mild tongue fullness was noted and CT was recommended      11/13/2015 Imaging    MRI orbit showed Interval growth of left tonsillar mass centered at the glossal tonsillar sulcus and extending superiorly along the target mandibular raphe. There is no definite intracranial extension. Left level 2 and level 3 adenopathy       11/19/2015 Procedure    He underwent left lymph node FNA with local anaethetics      11/19/2015 Pathology Results     [id number] Biopsy confirmed squamous cell carcinoma      12/10/2015 PET scan    Intensely hypermetabolic left palatine tonsil/left glossotonsillar sulcus/left posterior tongue mass, consistent with primary head and neck carcinoma. Hypermetabolic left level 2 and left level 3 cervical nodal metastases. No definite hypermetabolic metastatic disease in the chest, abdomen or pelvis.      01/01/2016 Procedure    Successful placement of a right IJ approach Power Port with ultrasound and  fluoroscopic guidance. The catheter is ready for use.      01/01/2016 Procedure    Successful placement of a 20 French pull-through gastrostomy tube.      01/06/2016 -  Radiation Therapy    The patient received radiation treatment only without chemotherapy due to multiple other comorbidities       INTERVAL HISTORY: Please see below for problem oriented charting. He is seen urgently today because of new onset of significant epistaxis this morning since 7 AM. He has been stuffing his left nasal passage with the tissue paper almost on an hourly basis. It was described as dripping but not gushing. He denies bleeding around the feeding tube. No hematuria or hematochezia. The patient is on Eliquis for chronic atrial fibrillation.  REVIEW OF SYSTEMS:   Constitutional: Denies fevers, chills or abnormal weight loss Eyes: Denies blurriness of vision Ears, nose, mouth, throat, and face: Denies mucositis or sore throat Respiratory: Denies cough, dyspnea or wheezes Cardiovascular: Denies palpitation, chest discomfort or lower extremity swelling Gastrointestinal:  Denies nausea, heartburn or change in bowel habits Skin: Denies abnormal skin rashes Lymphatics: Denies new lymphadenopathy or easy bruising Neurological:Denies numbness, tingling or new weaknesses Behavioral/Psych: Mood is stable, no new changes  All other systems were reviewed with the patient and are negative.  I have reviewed the past medical history, past surgical history, social history and family history with the patient and they are unchanged from previous note.  ALLERGIES:  is allergic to sulfa antibiotics and ramipril.  MEDICATIONS:  Current Outpatient Prescriptions  Medication Sig Dispense Refill  . acetaminophen (TYLENOL) 500 MG tablet Take 500 mg by mouth every 6 (  six) hours as needed.    Marland Kitchen allopurinol (ZYLOPRIM) 100 MG tablet Take 100 mg by mouth 2 (two) times daily.     Marland Kitchen amLODipine (NORVASC) 2.5 MG tablet TAKE 1  TABLET (2.5 MG TOTAL) BY MOUTH DAILY. 90 tablet 2  . apixaban (ELIQUIS) 2.5 MG TABS tablet Take 1 tablet (2.5 mg total) by mouth 2 (two) times daily. 180 tablet 3  . atorvastatin (LIPITOR) 20 MG tablet Take 20 mg by mouth daily.    . citric acid-sodium citrate (ORACIT) 334-500 MG/5ML solution Take 15 mLs by mouth every morning. 1 Tablespoon Daily    . Cyanocobalamin (VITAMIN B-12 IJ) Inject 1 each as directed every 30 (thirty) days.    Mariane Baumgarten Sodium (COLACE PO) Take 1 tablet by mouth daily as needed (FOR CONTIPATION).    . famotidine (TH FAMOTIDINE 10) 10 MG tablet Take 10 mg by mouth every morning.    . furosemide (LASIX) 40 MG tablet Take 1 tablet (40 mg total) by mouth every other day. 45 tablet 1  . gabapentin (NEURONTIN) 300 MG capsule Take 1-2 capsules (300-600 mg total) by mouth 3 (three) times daily. 180 capsule 12  . lactulose (CHRONULAC) 10 GM/15ML solution Place 15 mLs (10 g total) into feeding tube daily. 240 mL 1  . lidocaine (XYLOCAINE) 2 % solution Swish and swallow 78mL of this mixture,93min before meals and at bedtime, up to QID 100 mL 5  . Nutritional Supplements (FEEDING SUPPLEMENT, OSMOLITE 1.5 CAL,) LIQD Osmolite 1.5 via PEG, 1 can QID with 60 cc free water before and after bolus feeding. Increase to 1.5 cans Osmolite 1.5 when tolerated. Patient should drink 24 oz water daily or put in PEG. 1422 mL 0  . polyethylene glycol (MIRALAX) packet Take 17 g by mouth daily. 14 each 1  . promethazine (PHENERGAN) 25 MG tablet Take 1 tablet (25 mg total) by mouth every 6 (six) hours as needed for nausea or vomiting. 40 tablet 5  . sodium fluoride (FLUORISHIELD) 1.1 % GEL dental gel Instill one drop of gel per tooth space of fluoride tray. Place over teeth for 5 minutes. Remove. Spit out excess. Repeat nightly. 120 mL 11  . sucralfate (CARAFATE) 1 g tablet Dissolve 1 tablet in 38mL H2O and swallow up to QID,PRN sore throat. 40 tablet 5  . Tiotropium Bromide Monohydrate (SPIRIVA HANDIHALER  IN) Inhale 2 puffs into the lungs daily.    . traMADol (ULTRAM) 50 MG tablet Take by mouth every 6 (six) hours as needed.    . Wound Dressings (SONAFINE EX) Apply topically.     No current facility-administered medications for this visit.     PHYSICAL EXAMINATION: ECOG PERFORMANCE STATUS: 2 - Symptomatic, <50% confined to bed  Vitals:   02/07/16 1302  BP: (!) 149/55  Pulse: 78  Resp: 18  Temp: 97.7 F (36.5 C)   Filed Weights   02/07/16 1302  Weight: 147 lb 3.2 oz (66.8 kg)    GENERAL:alert, no distress and comfortable SKIN: skin color, texture, turgor are normal, no rashes or significant lesions EYES: normal, Conjunctiva are pink and non-injected, sclera clear OROPHARYNX:no exudate, no erythema and lips, buccal mucosa, and tongue normal . He has tissue paper stuffed into the left nasal passage with very mild bleeding noted, not soaked NECK: supple, thyroid normal size, non-tender, without nodularity LYMPH:  no palpable lymphadenopathy in the cervical, axillary or inguinal LUNGS: clear to auscultation and percussion with normal breathing effort HEART: Irregular rate and rhythm with left heart  murmur ABDOMEN:abdomen soft, non-tender and normal bowel sounds. Feeding tube site looks OK Musculoskeletal:no cyanosis of digits and no clubbing  NEURO: alert & oriented x 3 with fluent speech, no focal motor/sensory deficits  LABORATORY DATA:  I have reviewed the data as listed    Component Value Date/Time   NA 145 02/07/2016 1246   K 5.0 02/07/2016 1246   CL 102 01/01/2016 0750   CO2 29 02/07/2016 1246   GLUCOSE 113 02/07/2016 1246   BUN 76.1 (H) 02/07/2016 1246   CREATININE 1.9 (H) 02/07/2016 1246   CALCIUM 9.2 02/07/2016 1246   PROT 7.3 02/07/2016 1246   ALBUMIN 2.8 (L) 02/07/2016 1246   AST 34 02/07/2016 1246   ALT 27 02/07/2016 1246   ALKPHOS 150 02/07/2016 1246   BILITOT 0.92 02/07/2016 1246   GFRNONAA 31 (L) 01/01/2016 0750   GFRAA 36 (L) 01/01/2016 0750    No  results found for: SPEP, UPEP  Lab Results  Component Value Date   WBC 6.5 02/07/2016   NEUTROABS 4.9 02/07/2016   HGB 10.5 (L) 02/07/2016   HCT 32.5 (L) 02/07/2016   MCV 101.8 (H) 02/07/2016   PLT 215 02/07/2016      Chemistry      Component Value Date/Time   NA 145 02/07/2016 1246   K 5.0 02/07/2016 1246   CL 102 01/01/2016 0750   CO2 29 02/07/2016 1246   BUN 76.1 (H) 02/07/2016 1246   CREATININE 1.9 (H) 02/07/2016 1246      Component Value Date/Time   CALCIUM 9.2 02/07/2016 1246   ALKPHOS 150 02/07/2016 1246   AST 34 02/07/2016 1246   ALT 27 02/07/2016 1246   BILITOT 0.92 02/07/2016 1246      ASSESSMENT & PLAN:  Tongue cancer ([place]) He tolerated treatment well with expected side effects. I will continue to see him on a weekly basis for supportive care  Left-sided epistaxis This is mild-to-moderate in nature. The bleeding has slowed down. I recommend he hold anticoagulation therapy for minimum 24 hours before resumption We discussed strategy including compression to the nasal passage. If the bleeding continues to be severe, I recommend him to go to the emergency department for further evaluation and management  CKD (chronic kidney disease) stage 3, GFR 30-59 ml/min The patient have chronic renal failure. His serum creatinine is stable even though BUN is high but that could be related to bleeding. Continue to recommend increase hydration as tolerated  Malnutrition of moderate degree He has moderate protein calorie malnutrition. He is able to tolerate 6 cans of nutritional supplement. Continue same for now   No orders of the defined types were placed in this encounter.  All questions were answered. The patient knows to call the clinic with any problems, questions or concerns. No barriers to learning was detected. I spent 15 minutes counseling the patient face to face. The total time spent in the appointment was 20 minutes and more than 50% was on counseling and  review of test results     Heath Lark, MD 02/07/2016 1:45 PM

## 2016-02-07 NOTE — Assessment & Plan Note (Signed)
The patient have chronic renal failure. His serum creatinine is stable even though BUN is high but that could be related to bleeding. Continue to recommend increase hydration as tolerated

## 2016-02-07 NOTE — Telephone Encounter (Signed)
Dr. Alvy Bimler notified of nose bleeding.   Added Lab and MD appt today after XRT.   Notified wife for pt to check in after XRT for lab/MD.  She verbalized understanding.

## 2016-02-07 NOTE — Assessment & Plan Note (Signed)
He has moderate protein calorie malnutrition. He is able to tolerate 6 cans of nutritional supplement. Continue same for now

## 2016-02-10 ENCOUNTER — Ambulatory Visit
Admission: RE | Admit: 2016-02-10 | Discharge: 2016-02-10 | Disposition: A | Discharge status: Home / Self Care | Payer: Medicare Other | Source: Ambulatory Visit | Attending: Radiation Oncology | Admitting: Radiation Oncology

## 2016-02-10 ENCOUNTER — Encounter: Payer: Self-pay | Admitting: Radiation Oncology

## 2016-02-10 VITALS — Temp 98.3°F | Ht 69.0 in | Wt 148.2 lb

## 2016-02-10 DIAGNOSIS — Z510 Encounter for antineoplastic radiation therapy: Secondary | ICD-10-CM | POA: Diagnosis not present

## 2016-02-10 DIAGNOSIS — C090 Malignant neoplasm of tonsillar fossa: Secondary | ICD-10-CM

## 2016-02-10 NOTE — Progress Notes (Signed)
[name] presents for his 26th fraction of radiation to his Left Tonsil, Tongue. He denies pain. He tells me his facial pain has improved, he is asking how to decrease his neurontin dose. He would like to take less of it. He is currently taking 600 mg in the morning and 400 mg at night. He is not using lidocaine and carafate. He has had difficulty dissolving the carafate, and doesn't want to take it. He reports thick mucous. He is using the baking soda rinse during the day. The skin to his neck is dry with some peeling noted to the left side and back. He is using sonafine cream to his neck, and knows to use neosporin if an open area is observed. He is using his feeding tube for nutrition. He is instilling 6 cans of nutritional supplement daily. He is instilling about 1000cc's of water in his feeding tube daily. He is drinking water orally throughout the day, but reports he doesn't think it is too much.   Temp 98.3 F (36.8 C)   Ht 5\' 9"  (1.753 m)   Wt 148 lb 3.2 oz (67.2 kg)   SpO2 92%   BMI 21.89 kg/m    Orthostatics: BP sitting 139/52 pulse 113. BP standing 142/70, pulse 90.   Wt Readings from Last 3 Encounters:  02/10/16 148 lb 3.2 oz (67.2 kg)  02/07/16 147 lb 3.2 oz (66.8 kg)  02/04/16 148 lb 1.6 oz (67.2 kg)

## 2016-02-10 NOTE — Progress Notes (Signed)
   Weekly Management Note:  Outpatient    ICD-9-CM ICD-10-CM   1. Carcinoma of tonsillar fossa (HCC) 146.1 C09.0     Current Dose: 52 Gy  Projected Dose: 70 Gy   Narrative:  The patient presents for routine under treatment assessment.  CBCT/MVCT images/Port film x-rays were reviewed.  The chart was checked.  Denies pain.  Facial pain has disappeared, he is pleased. Weight is stable.   Physical Findings:  Wt Readings from Last 3 Encounters:  02/10/16 148 lb 3.2 oz (67.2 kg)  02/07/16 147 lb 3.2 oz (66.8 kg)  02/04/16 148 lb 1.6 oz (67.2 kg)    height is 5\' 9"  (1.753 m) and weight is 148 lb 3.2 oz (67.2 kg). His temperature is 98.3 F (36.8 C). His oxygen saturation is 92%.    NAD ; dry mouth,  no thrush Dry skin over neck.  Mucositis in mouth with thick secretions, no thrush or blood.  CBC    Component Value Date/Time   WBC 6.5 02/07/2016 1246   WBC 6.2 01/01/2016 0750   RBC 3.19 (L) 02/07/2016 1246   RBC 4.13 (L) 01/01/2016 0750   HGB 10.5 (L) 02/07/2016 1246   HCT 32.5 (L) 02/07/2016 1246   PLT 215 02/07/2016 1246   MCV 101.8 (H) 02/07/2016 1246   MCH 33.0 02/07/2016 1246   MCH 33.4 01/01/2016 0750   MCHC 32.4 02/07/2016 1246   MCHC 33.4 01/01/2016 0750   RDW 18.1 (H) 02/07/2016 1246   LYMPHSABS 0.4 (L) 02/07/2016 1246   MONOABS 0.9 02/07/2016 1246   EOSABS 0.3 02/07/2016 1246   BASOSABS 0.0 02/07/2016 1246     CMP     Component Value Date/Time   NA 145 02/07/2016 1246   K 5.0 02/07/2016 1246   CL 102 01/01/2016 0750   CO2 29 02/07/2016 1246   GLUCOSE 113 02/07/2016 1246   BUN 76.1 (H) 02/07/2016 1246   CREATININE 1.9 (H) 02/07/2016 1246   CALCIUM 9.2 02/07/2016 1246   PROT 7.3 02/07/2016 1246   ALBUMIN 2.8 (L) 02/07/2016 1246   AST 34 02/07/2016 1246   ALT 27 02/07/2016 1246   ALKPHOS 150 02/07/2016 1246   BILITOT 0.92 02/07/2016 1246   GFRNONAA 31 (L) 01/01/2016 0750   GFRAA 36 (L) 01/01/2016 0750     Impression:  The patient is tolerating  radiotherapy.   Plan:  Continue radiotherapy as planned. Good nutrition, Gaining weight. Continue sonafine over neck .  His serum creatinine is stable even though BUN is high, but that could be related to bleeding per med/onc. Continue to increase hydration as tolerated.  -----------------------------------  Eppie Gibson, MD

## 2016-02-11 ENCOUNTER — Telehealth: Payer: Self-pay | Admitting: Diagnostic Neuroimaging

## 2016-02-11 ENCOUNTER — Ambulatory Visit
Admission: RE | Admit: 2016-02-11 | Discharge: 2016-02-11 | Disposition: A | Discharge status: Home / Self Care | Payer: Medicare Other | Source: Ambulatory Visit | Attending: Radiation Oncology | Admitting: Radiation Oncology

## 2016-02-11 ENCOUNTER — Encounter (HOSPITAL_COMMUNITY): Payer: Medicare Other

## 2016-02-11 ENCOUNTER — Ambulatory Visit (HOSPITAL_BASED_OUTPATIENT_CLINIC_OR_DEPARTMENT_OTHER): Payer: Medicare Other | Admitting: Hematology and Oncology

## 2016-02-11 ENCOUNTER — Encounter: Payer: Self-pay | Admitting: Hematology and Oncology

## 2016-02-11 ENCOUNTER — Encounter: Payer: Self-pay | Admitting: *Deleted

## 2016-02-11 DIAGNOSIS — C029 Malignant neoplasm of tongue, unspecified: Secondary | ICD-10-CM | POA: Diagnosis present

## 2016-02-11 DIAGNOSIS — R040 Epistaxis: Secondary | ICD-10-CM

## 2016-02-11 DIAGNOSIS — K5900 Constipation, unspecified: Secondary | ICD-10-CM | POA: Diagnosis not present

## 2016-02-11 DIAGNOSIS — Z931 Gastrostomy status: Secondary | ICD-10-CM | POA: Diagnosis not present

## 2016-02-11 DIAGNOSIS — Z510 Encounter for antineoplastic radiation therapy: Secondary | ICD-10-CM | POA: Diagnosis not present

## 2016-02-11 NOTE — Assessment & Plan Note (Signed)
He had recent weight loss. Since he started to use his feeding tube, he has gained some weight. He has very mild intermittent bleeding around the feeding tube site which I suspect is related to chronic anticoagulation therapy. I recommend conservative management for now

## 2016-02-11 NOTE — Progress Notes (Signed)
[facility] OFFICE PROGRESS NOTE  Patient Care Team: Lajean Manes, MD as PCP - General (Internal Medicine) Newt Minion, MD as Consulting Physician (Orthopedic Surgery) Heath Lark, MD as Consulting Physician (Hematology and Oncology) Jerrell Belfast, MD as Consulting Physician (Otolaryngology) Penni Bombard, MD as Consulting Physician (Neurology) Leota Sauers, RN as Oncology Nurse Navigator Eppie Gibson, MD as Attending Physician (Radiation Oncology) Karie Mainland, RD as Dietitian (Nutrition) Jettie Booze, MD as Consulting Physician (Cardiology)  SUMMARY OF ONCOLOGIC HISTORY:   Tongue cancer ([place])   08/29/2015 Imaging    Outside ECHO at [facility]: NORMAL LEFT VENTRICULAR FUNCTION WITH MILD LVH, NORMAL RIGHT VENTRICULAR SYSTOLIC FUNCTION, VALVULAR REGURGITATION: MODERATE AR, MILD MR, MILD PR, MILD TR VALVULAR STENOSIS: MILD MS MILD-MODERATE PARAVALVULAR AORTIC REGURGITATION      09/26/2015 Miscellaneous    He was seen by Dr. Wilburn Cornelia. Mild tongue fullness was noted and CT was recommended      11/13/2015 Imaging    MRI orbit showed Interval growth of left tonsillar mass centered at the glossal tonsillar sulcus and extending superiorly along the target mandibular raphe. There is no definite intracranial extension. Left level 2 and level 3 adenopathy       11/19/2015 Procedure    He underwent left lymph node FNA with local anaethetics      11/19/2015 Pathology Results     [id number] Biopsy confirmed squamous cell carcinoma      12/10/2015 PET scan    Intensely hypermetabolic left palatine tonsil/left glossotonsillar sulcus/left posterior tongue mass, consistent with primary head and neck carcinoma. Hypermetabolic left level 2 and left level 3 cervical nodal metastases. No definite hypermetabolic metastatic disease in the chest, abdomen or pelvis.      01/01/2016 Procedure    Successful placement of a right IJ approach Power Port with ultrasound and  fluoroscopic guidance. The catheter is ready for use.      01/01/2016 Procedure    Successful placement of a 20 French pull-through gastrostomy tube.      01/06/2016 -  Radiation Therapy    The patient received radiation treatment only without chemotherapy due to multiple other comorbidities       INTERVAL HISTORY: Please see below for problem oriented charting. He is seen for further follow-up. Epistaxis has resolved. He had recent constipation, no bowel movement for 3 days. Denies recent nausea. He denies pain. He is able to swallow liquids. No dysphagia. He is able to tolerate nutritional supplement without problems  REVIEW OF SYSTEMS:   Constitutional: Denies fevers, chills or abnormal weight loss Eyes: Denies blurriness of vision Ears, nose, mouth, throat, and face: Denies mucositis or sore throat Respiratory: Denies cough, dyspnea or wheezes Cardiovascular: Denies palpitation, chest discomfort or lower extremity swelling Skin: Denies abnormal skin rashes Lymphatics: Denies new lymphadenopathy or easy bruising Neurological:Denies numbness, tingling or new weaknesses Behavioral/Psych: Mood is stable, no new changes  All other systems were reviewed with the patient and are negative.  I have reviewed the past medical history, past surgical history, social history and family history with the patient and they are unchanged from previous note.  ALLERGIES:  is allergic to sulfa antibiotics and ramipril.  MEDICATIONS:  Current Outpatient Prescriptions  Medication Sig Dispense Refill  . acetaminophen (TYLENOL) 500 MG tablet Take 500 mg by mouth every 6 (six) hours as needed.    Marland Kitchen allopurinol (ZYLOPRIM) 100 MG tablet Take 100 mg by mouth 2 (two) times daily.     Marland Kitchen amLODipine (NORVASC) 2.5  MG tablet TAKE 1 TABLET (2.5 MG TOTAL) BY MOUTH DAILY. 90 tablet 2  . apixaban (ELIQUIS) 2.5 MG TABS tablet Take 1 tablet (2.5 mg total) by mouth 2 (two) times daily. 180 tablet 3  .  atorvastatin (LIPITOR) 20 MG tablet Take 20 mg by mouth daily.    . citric acid-sodium citrate (ORACIT) 334-500 MG/5ML solution Take 15 mLs by mouth every morning. 1 Tablespoon Daily    . Cyanocobalamin (VITAMIN B-12 IJ) Inject 1 each as directed every 30 (thirty) days.    Mariane Baumgarten Sodium (COLACE PO) Take 1 tablet by mouth daily as needed (FOR CONTIPATION).    . famotidine (TH FAMOTIDINE 10) 10 MG tablet Take 10 mg by mouth every morning.    . furosemide (LASIX) 40 MG tablet Take 1 tablet (40 mg total) by mouth every other day. 45 tablet 1  . gabapentin (NEURONTIN) 300 MG capsule Take 1-2 capsules (300-600 mg total) by mouth 3 (three) times daily. 180 capsule 12  . lactulose (CHRONULAC) 10 GM/15ML solution Place 15 mLs (10 g total) into feeding tube daily. (Patient not taking: Reported on 02/10/2016) 240 mL 1  . lidocaine (XYLOCAINE) 2 % solution Swish and swallow 46mL of this mixture,20min before meals and at bedtime, up to QID (Patient not taking: Reported on 02/10/2016) 100 mL 5  . Nutritional Supplements (FEEDING SUPPLEMENT, OSMOLITE 1.5 CAL,) LIQD Osmolite 1.5 via PEG, 1 can QID with 60 cc free water before and after bolus feeding. Increase to 1.5 cans Osmolite 1.5 when tolerated. Patient should drink 24 oz water daily or put in PEG. 1422 mL 0  . polyethylene glycol (MIRALAX) packet Take 17 g by mouth daily. (Patient not taking: Reported on 02/10/2016) 14 each 1  . promethazine (PHENERGAN) 25 MG tablet Take 1 tablet (25 mg total) by mouth every 6 (six) hours as needed for nausea or vomiting. (Patient not taking: Reported on 02/10/2016) 40 tablet 5  . sodium fluoride (FLUORISHIELD) 1.1 % GEL dental gel Instill one drop of gel per tooth space of fluoride tray. Place over teeth for 5 minutes. Remove. Spit out excess. Repeat nightly. 120 mL 11  . sucralfate (CARAFATE) 1 g tablet Dissolve 1 tablet in 45mL H2O and swallow up to QID,PRN sore throat. (Patient not taking: Reported on 02/10/2016) 40 tablet  5  . Tiotropium Bromide Monohydrate (SPIRIVA HANDIHALER IN) Inhale 2 puffs into the lungs daily.    . traMADol (ULTRAM) 50 MG tablet Take by mouth every 6 (six) hours as needed.    . Wound Dressings (SONAFINE EX) Apply topically.     No current facility-administered medications for this visit.     PHYSICAL EXAMINATION: ECOG PERFORMANCE STATUS: 1 - Symptomatic but completely ambulatory  Vitals:   02/11/16 1223  BP: (!) 135/50  Pulse: 72  Resp: 16  Temp: 98.1 F (36.7 C)   Filed Weights   02/11/16 1223  Weight: 146 lb 9.6 oz (66.5 kg)    GENERAL:alert, no distress and comfortable. He looks thin SKIN: skin color, texture, turgor are normal, no rashes or significant lesions EYES: normal, Conjunctiva are pink and non-injected, sclera clear OROPHARYNX:no exudate, no erythema and lips, buccal mucosa, and tongue normal  NECK: supple, thyroid normal size, non-tender, without nodularity LYMPH:  no palpable lymphadenopathy in the cervical, axillary or inguinal LUNGS: clear to auscultation and percussion with normal breathing effort HEART: regular rate & rhythm and no murmurs and no lower extremity edema ABDOMEN:abdomen soft, non-tender and normal bowel sounds. Feeding tube site  looks ok Musculoskeletal:no cyanosis of digits and no clubbing  NEURO: alert & oriented x 3 with fluent speech, no focal motor/sensory deficits  LABORATORY DATA:  I have reviewed the data as listed    Component Value Date/Time   NA 145 02/07/2016 1246   K 5.0 02/07/2016 1246   CL 102 01/01/2016 0750   CO2 29 02/07/2016 1246   GLUCOSE 113 02/07/2016 1246   BUN 76.1 (H) 02/07/2016 1246   CREATININE 1.9 (H) 02/07/2016 1246   CALCIUM 9.2 02/07/2016 1246   PROT 7.3 02/07/2016 1246   ALBUMIN 2.8 (L) 02/07/2016 1246   AST 34 02/07/2016 1246   ALT 27 02/07/2016 1246   ALKPHOS 150 02/07/2016 1246   BILITOT 0.92 02/07/2016 1246   GFRNONAA 31 (L) 01/01/2016 0750   GFRAA 36 (L) 01/01/2016 0750    No results  found for: SPEP, UPEP  Lab Results  Component Value Date   WBC 6.5 02/07/2016   NEUTROABS 4.9 02/07/2016   HGB 10.5 (L) 02/07/2016   HCT 32.5 (L) 02/07/2016   MCV 101.8 (H) 02/07/2016   PLT 215 02/07/2016      Chemistry      Component Value Date/Time   NA 145 02/07/2016 1246   K 5.0 02/07/2016 1246   CL 102 01/01/2016 0750   CO2 29 02/07/2016 1246   BUN 76.1 (H) 02/07/2016 1246   CREATININE 1.9 (H) 02/07/2016 1246      Component Value Date/Time   CALCIUM 9.2 02/07/2016 1246   ALKPHOS 150 02/07/2016 1246   AST 34 02/07/2016 1246   ALT 27 02/07/2016 1246   BILITOT 0.92 02/07/2016 1246      ASSESSMENT & PLAN:  Tongue cancer ([place]) He tolerated treatment well with expected side effects. I will continue to see him on a weekly basis for supportive care  Left-sided epistaxis This has resolved with conservative management. He has resumed anticoagulation therapy without further bleeding.  S/P gastrostomy ([place]) He had recent weight loss. Since he started to use his feeding tube, he has gained some weight. He has very mild intermittent bleeding around the feeding tube site which I suspect is related to chronic anticoagulation therapy. I recommend conservative management for now  Acute constipation He has recurrent severe constipation I will recommend daily Miralax along with lactulose until he has bowel movement. We discussed the importance of getting constipation under control to avoid complications   No orders of the defined types were placed in this encounter.  All questions were answered. The patient knows to call the clinic with any problems, questions or concerns. No barriers to learning was detected. I spent 15 minutes counseling the patient face to face. The total time spent in the appointment was 20 minutes and more than 50% was on counseling and review of test results     Heath Lark, MD 02/11/2016 2:03 PM

## 2016-02-11 NOTE — Telephone Encounter (Signed)
Pt's wife call in wanting to know about decrease the gabapentin (NEURONTIN) 300 MG capsule. Please call and advise

## 2016-02-11 NOTE — Assessment & Plan Note (Signed)
He has recurrent severe constipation I will recommend daily Miralax along with lactulose until he has bowel movement. We discussed the importance of getting constipation under control to avoid complications

## 2016-02-11 NOTE — Telephone Encounter (Signed)
Per Dr Leta Baptist, spoke with wife and advised that patient needs to cut back by 300 mg each day x 1 week, repeat the sequence until tha last week of taking 300 mg once a day. The following week he will stop. She verbalized understanding, appreciation.

## 2016-02-11 NOTE — Assessment & Plan Note (Signed)
This has resolved with conservative management. He has resumed anticoagulation therapy without further bleeding.

## 2016-02-11 NOTE — Telephone Encounter (Signed)
Spoke with wife who stated her husband has had no pain in his face x 2 weeks from trigeminal neuralgia. He currently is taking Gabapentin - two 300 mg capsules in am and one 300 mg with one 100 mg capsule at night. She stated he only has three 100 mg tabs left. She wants to know how to taper him off Gabapentin since he is no longer having facial pain. He is still receiving radiation treatments, in week 6 of 7.  She stated he is "not even having throat pain". Informed her this RN will route to Dr Leta Baptist for reply and call her back. She verbalized understanding, appreciation of call.

## 2016-02-11 NOTE — Assessment & Plan Note (Signed)
He tolerated treatment well with expected side effects. I will continue to see him on a weekly basis for supportive care

## 2016-02-11 NOTE — Telephone Encounter (Signed)
Reduce by 300mg  per day, every week. -VRP

## 2016-02-12 ENCOUNTER — Ambulatory Visit: Payer: Medicare Other | Admitting: Nutrition

## 2016-02-12 ENCOUNTER — Ambulatory Visit
Admission: RE | Admit: 2016-02-12 | Discharge: 2016-02-12 | Disposition: A | Discharge status: Home / Self Care | Payer: Medicare Other | Source: Ambulatory Visit | Attending: Radiation Oncology | Admitting: Radiation Oncology

## 2016-02-12 ENCOUNTER — Encounter (HOSPITAL_COMMUNITY): Payer: Medicare Other

## 2016-02-12 DIAGNOSIS — Z510 Encounter for antineoplastic radiation therapy: Secondary | ICD-10-CM | POA: Diagnosis not present

## 2016-02-12 NOTE — Progress Notes (Signed)
Nutrition follow-up completed with patient receiving radiation therapy for tonsil cancer. Weight documented as 146.6 pounds on October 24. Labs noted: BUN 76.1, creatinine 1.9, albumin 2.8. Patient reports his throat is now becoming sore. Reports thickened saliva inhibiting his ability to drink water. Reports constipation is still a problem. Tolerating 6 cans a combination of Osmolite 1.5 and Jevity 1.5 to meet greater than 90% of estimated nutrition needs. Patient reports Jevity 1.5 has not improved constipation and wants to resume 6 cans Osmolite 1.5.  Nutrition diagnosis: Malnutrition improved.  Intervention:  Educated patient to begin 240 cc free water 3 times a day between tube feedings to provide additional free water. Patient given option for continuing Jevity 1.5.  However, he declines. Recommended patient increase baking soda and salt water gargle and suggested a suction machine may be helpful. Encouraged patient to discuss IV fluids with M.D. as needed. Questions were answered.  Teach back method used.  Monitoring, evaluation, goals:  Patient will tolerate tube feeding with adequate free water flushes to meet greater than 90% of estimated needs to promote weight maintenance and healing.  Next visit: To be scheduled.  **Disclaimer: This note was dictated with voice recognition software. Similar sounding words can inadvertently be transcribed and this note may contain transcription errors which may not have been corrected upon publication of note.**

## 2016-02-12 NOTE — Progress Notes (Signed)
Oncology Nurse Navigator Documentation  Met with [name] during Established Patient appt with Dr. Alvy Bimler to provide support and encouragement.  He was accompanied by his wife. He reported absence of BM over past 3 days, voiced understanding of Dr. Calton Dach guidance to return to daily Miralax to maintain regularity. They recognized that final RT is next Friday.  Gayleen Orem, RN, BSN, [place] at [place] [phone]

## 2016-02-13 ENCOUNTER — Encounter (HOSPITAL_COMMUNITY): Payer: Medicare Other

## 2016-02-13 ENCOUNTER — Ambulatory Visit
Admission: RE | Admit: 2016-02-13 | Discharge: 2016-02-13 | Disposition: A | Discharge status: Home / Self Care | Payer: Medicare Other | Source: Ambulatory Visit | Attending: Radiation Oncology | Admitting: Radiation Oncology

## 2016-02-13 ENCOUNTER — Encounter: Payer: Self-pay | Admitting: Nutrition

## 2016-02-13 DIAGNOSIS — Z510 Encounter for antineoplastic radiation therapy: Secondary | ICD-10-CM | POA: Diagnosis not present

## 2016-02-14 ENCOUNTER — Ambulatory Visit
Admission: RE | Admit: 2016-02-14 | Discharge: 2016-02-14 | Disposition: A | Discharge status: Home / Self Care | Payer: Medicare Other | Source: Ambulatory Visit | Attending: Radiation Oncology | Admitting: Radiation Oncology

## 2016-02-14 DIAGNOSIS — Z510 Encounter for antineoplastic radiation therapy: Secondary | ICD-10-CM | POA: Diagnosis not present

## 2016-02-17 ENCOUNTER — Encounter: Payer: Self-pay | Admitting: *Deleted

## 2016-02-17 ENCOUNTER — Ambulatory Visit: Admission: RE | Admit: 2016-02-17 | Payer: Medicare Other | Source: Ambulatory Visit

## 2016-02-17 ENCOUNTER — Ambulatory Visit: Payer: Medicare Other | Attending: Hematology and Oncology

## 2016-02-17 ENCOUNTER — Ambulatory Visit: Payer: Medicare Other

## 2016-02-17 ENCOUNTER — Encounter: Payer: Self-pay | Admitting: Radiation Oncology

## 2016-02-17 ENCOUNTER — Ambulatory Visit
Admission: RE | Admit: 2016-02-17 | Discharge: 2016-02-17 | Disposition: A | Discharge status: Home / Self Care | Payer: Medicare Other | Source: Ambulatory Visit | Attending: Radiation Oncology | Admitting: Radiation Oncology

## 2016-02-17 VITALS — BP 129/50 | HR 80 | Temp 98.3°F | Resp 16 | Wt 146.2 lb

## 2016-02-17 DIAGNOSIS — L814 Other melanin hyperpigmentation: Secondary | ICD-10-CM | POA: Insufficient documentation

## 2016-02-17 DIAGNOSIS — C090 Malignant neoplasm of tonsillar fossa: Secondary | ICD-10-CM | POA: Insufficient documentation

## 2016-02-17 DIAGNOSIS — L299 Pruritus, unspecified: Secondary | ICD-10-CM | POA: Insufficient documentation

## 2016-02-17 DIAGNOSIS — R1310 Dysphagia, unspecified: Secondary | ICD-10-CM | POA: Insufficient documentation

## 2016-02-17 MED ORDER — SONAFINE EX EMUL
1.0000 "application " | Freq: Two times a day (BID) | CUTANEOUS | Status: DC
  Administered 2016-02-17: 1 via TOPICAL

## 2016-02-17 NOTE — Progress Notes (Signed)
Oncology Nurse Navigator Documentation  Met with [name] and his wife prior to [facility] XRT. He reported:  Had BM yesterday, having them regularly but not daily.  Not interested in scheduled IVF to supplement PEG /oral intake per Dr. Calton Dach offer .  I explained benefit but when I indicated time involved, he replied "I'll go with the tube".   They understand I will join them tomorrow during appt with Dr. Alvy Bimler.  Gayleen Orem, RN, BSN, [place] at [place] [phone]

## 2016-02-17 NOTE — Progress Notes (Signed)
PAIN: He is currently in no pain.  SWALLOWING/DIET: Pt has had dysphagia for both solids and liquids. Pt reports a most food is via feeding tube, formula-Osmolite, 6 cans per day, 60 ml H20 flush. Peg tube site appearance-pink/red, small amount of yellow drainage with blood. Oral exam reveals mild erythema with noted a pink point red mark on right posterior roof of mouth white and tenacious sputum.  BOWEL: Pt reports, a bowel movement every day. SKIN: Skin exam reveals Hyperpigmentation, Pruritus, dry desquamation and erythema. Pt continues to apply Sonafine as directed. OTHER: Pt complains of fatigue and poor appetite.   WEIGHT/VS: BP (!) 129/50   Pulse 80   Temp 98.3 F (36.8 C) (Oral)   Resp 16   Wt 146 lb 3.2 oz (66.3 kg)   SpO2 93%   BMI 21.59 kg/m  Wt Readings from Last 3 Encounters:  02/17/16 146 lb 3.2 oz (66.3 kg)  02/11/16 146 lb 9.6 oz (66.5 kg)  02/10/16 148 lb 3.2 oz (67.2 kg)   Orthostatic VS standing: 115/63, 95% POX, 97 P

## 2016-02-17 NOTE — Progress Notes (Signed)
   Weekly Management Note:  Outpatient    ICD-9-CM ICD-10-CM   1. Carcinoma of tonsillar fossa (HCC) 146.1 0000000 Basic metabolic panel    Current Dose: 62  Gy  Projected Dose: 70 Gy   Narrative:  The patient presents for routine under treatment assessment.  CBCT/MVCT images/Port film x-rays were reviewed.  The chart was checked.  Denies pain.    PAIN: [name] is currently in no pain.  SWALLOWING/DIET: [name] has had dysphagia for both solids and liquids. [name] reports a most food is via feeding tube, formula-Osmolite, 6 cans per day, 60 ml H20 flush. Peg tube site appearance-pink/red, small amount of yellow drainage with blood.  BOWEL: [name] reports, a bowel movement every day. SKIN: Skin exam reveals Hyperpigmentation, Pruritus, dry desquamation and erythema. [name] continues to apply Sonafine as directed. OTHER: [name] complains of fatigue and poor appetite.  Poor taste    Physical Findings:  Wt Readings from Last 3 Encounters:  02/17/16 146 lb 3.2 oz (66.3 kg)  02/11/16 146 lb 9.6 oz (66.5 kg)  02/10/16 148 lb 3.2 oz (67.2 kg)    weight is 146 lb 3.2 oz (66.3 kg). His oral temperature is 98.3 F (36.8 C). His blood pressure is 129/50 (abnormal) and his pulse is 80. His respiration is 16 and oxygen saturation is 93%.    Orthostatic VS standing: 115/63, 95% POX, 97 P NAD ;    Dry skin over neck.  Mucositis in mouth with thick secretions, no thrush or blood. (stable exam)  CBC    Component Value Date/Time   WBC 6.5 02/07/2016 1246   WBC 6.2 01/01/2016 0750   RBC 3.19 (L) 02/07/2016 1246   RBC 4.13 (L) 01/01/2016 0750   HGB 10.5 (L) 02/07/2016 1246   HCT 32.5 (L) 02/07/2016 1246   PLT 215 02/07/2016 1246   MCV 101.8 (H) 02/07/2016 1246   MCH 33.0 02/07/2016 1246   MCH 33.4 01/01/2016 0750   MCHC 32.4 02/07/2016 1246   MCHC 33.4 01/01/2016 0750   RDW 18.1 (H) 02/07/2016 1246   LYMPHSABS 0.4 (L) 02/07/2016 1246   MONOABS 0.9 02/07/2016 1246   EOSABS 0.3 02/07/2016 1246   BASOSABS 0.0  02/07/2016 1246     CMP     Component Value Date/Time   NA 145 02/07/2016 1246   K 5.0 02/07/2016 1246   CL 102 01/01/2016 0750   CO2 29 02/07/2016 1246   GLUCOSE 113 02/07/2016 1246   BUN 76.1 (H) 02/07/2016 1246   CREATININE 1.9 (H) 02/07/2016 1246   CALCIUM 9.2 02/07/2016 1246   PROT 7.3 02/07/2016 1246   ALBUMIN 2.8 (L) 02/07/2016 1246   AST 34 02/07/2016 1246   ALT 27 02/07/2016 1246   ALKPHOS 150 02/07/2016 1246   BILITOT 0.92 02/07/2016 1246   GFRNONAA 31 (L) 01/01/2016 0750   GFRAA 36 (L) 01/01/2016 0750     Impression:  The patient is tolerating radiotherapy.   Plan:  Continue radiotherapy as planned. BMP prior to RT on 10-31, asked for lab to please flag results to [place]. [name] sees Dr Alvy Bimler that day and may be considered for IV fluids if needed. Until then, advised to push water intake.  F/u the week of Nov 13 with me.  -----------------------------------  Eppie Gibson, MD

## 2016-02-17 NOTE — Therapy (Signed)
[facility] [address] [place], [place], [address] Phone: [phone]   Fax:  [phone]  Speech Language Pathology Treatment  Patient Details  Name: [name] MRN: [id number] Date of Birth: 06/28/1930 Referring Provider: Eppie Gibson M.D.  Encounter Date: 02/17/2016      End of Session - 02/17/16 1424    Visit Number 2   Number of Visits 3   Date for SLP Re-Evaluation 03/20/16   SLP Start Time 0944   SLP Stop Time  7026   SLP Time Calculation (min) 31 min   Activity Tolerance Patient tolerated treatment well      Past Medical History:  Diagnosis Date  . AAA (abdominal aortic aneurysm) ([place])   . Acute polyneuropathy related to gastric bypass surgery [facility]) 2001   Quintuple bypass surgery  . Aortic valve disorder    s/p TAVR at [facility]   . Atrial fibrillation (HCC)    Chronic  . Atrial flutter ([place])   . Bell's palsy   . Bell's palsy   . Bladder cancer ([place])   . CAD (coronary artery disease)    Status post CABG, stent to diagonal prior to TAVR  . Cancer ([place])    basal cell  . Carotid stenosis   . CKD (chronic kidney disease)   . Colon cancer ([place])   . Deviated septum 1950?  . Gallstone   . Gout   . H/O laminectomy 1951   Staph Septicemia  . HTN (hypertension)   . Hypercholesteremia   . PVD (peripheral vascular disease) ([place])   . Vitamin B12 deficiency     Past Surgical History:  Procedure Laterality Date  . ANGIOPLASTY  04/2002  . AORTIC VALVE REPLACEMENT  08/2010  . APPENDECTOMY    . BLADDER SURGERY  02/2001   neo bladder replaced cancerous bladder, prostate removed  . cataract surgery Bilateral 07/25/09, 08/15/09  . COLECTOMY  07/2002   colon cancer  . COLONOSCOPY N/A 06/30/2013   Procedure: COLONOSCOPY;  Surgeon: Winfield Cunas., MD;  Location: [facility] ENDOSCOPY;  Service: Endoscopy;  Laterality: N/A;  . COLONOSCOPY WITH ESOPHAGOGASTRODUODENOSCOPY (EGD) N/A 07/03/2013   Procedure:  COLONOSCOPY ;  Surgeon: Missy Sabins, MD;  Location: [employer];  Service: Endoscopy;  Laterality: N/A;  Patient is already sedated on the ventilator and may need minimal if any additional sedation.   . CORONARY ARTERY BYPASS GRAFT  02/2000  . ESOPHAGOGASTRODUODENOSCOPY N/A 06/28/2013   Procedure: ESOPHAGOGASTRODUODENOSCOPY (EGD);  Surgeon: Winfield Cunas., MD;  Location: [facility] ENDOSCOPY;  Service: Endoscopy;  Laterality: N/A;  . ESOPHAGOGASTRODUODENOSCOPY N/A 07/03/2013   Procedure: ESOPHAGOGASTRODUODENOSCOPY (EGD);  Surgeon: Missy Sabins, MD;  Location: [facility] ENDOSCOPY;  Service: Endoscopy;  Laterality: N/A;  . GIVENS CAPSULE STUDY N/A 07/03/2013   Procedure: GIVENS CAPSULE STUDY;  Surgeon: Missy Sabins, MD;  Location: [facility];  Service: Endoscopy;  Laterality: N/A;  . HEMICOLECTOMY    . IR GENERIC HISTORICAL  01/01/2016   IR FLUORO GUIDE PORT INSERTION RIGHT 01/01/2016 Jacqulynn Cadet, MD [facility]-INTERV RAD  . IR GENERIC HISTORICAL  01/01/2016   IR US GUIDE VASC ACCESS RIGHT 01/01/2016 Jacqulynn Cadet, MD [facility]  . IR GENERIC HISTORICAL  01/01/2016   IR GASTROSTOMY TUBE MOD SED 01/01/2016 Jacqulynn Cadet, MD [facility]INTER[facility]  . LAMINECTOMY    . MOHS SURGERY  09/2005   nose  . TOE SURGERY      There were no vitals filed for this visit.  Subjective Assessment - 02/17/16 1044    Subjective "My mouth is dry" - SLP provided water with immediate cough response. SLP suggested ice chips instead of water, after thorough oral care.   Patient is accompained by: Family member  wife   Currently in Pain? No/denies               ADULT SLP TREATMENT - 02/17/16 1048      General Information   Behavior/Cognition Alert;Cooperative;Pleasant mood     Treatment Provided   Treatment provided Dysphagia     Dysphagia Treatment   Temperature Spikes Noted No   Respiratory Status Room air   Treatment Methods Therapeutic exercise;Skilled observation   Patient observed directly with PO's  Yes   Type of PO's observed Thin liquids;Ice chips   Liquids provided via Cup   Pharyngeal Phase Signs & Symptoms Immediate cough;Wet vocal quality   Other treatment/comments Given pt's coughing immediately during drink of H2O at beginning of therapy, and no overt s/s aspiration PNA ("nor any reported), After thorough oral care, SLP suggested ice chips instead of H2O. Pt with "Wet" voice after ice chip, but no coughing. SLP had pt swallow twice, clear throat and reswallow. Using this method, pt's voice remained clear. Pt with adequate strength throat clear during session. SLP discouraged ice chips when pt fatigued. With HEP, pt req'd SLP occasional min A. Pt's dysarthria remains due to Bell's Palsy.          SLP Education - 02/17/16 1423    Education provided Yes   Education Details HEP, late effects head/neck radiation   Person(s) Educated Patient;Spouse   Methods Explanation;Demonstration;Verbal cues   Comprehension Verbalized understanding;Verbal cues required;Returned demonstration          SLP Short Term Goals - 02/17/16 1428      SLP SHORT TERM GOAL #1   Title pt will tell SLP why he is completing HEP with modified independence   Status Partially Met     SLP SHORT TERM GOAL #2   Title pt will complete HEP correctly with rare min A   Time 1   Status Not Met     SLP SHORT TERM GOAL #3   Title pt will demo knowledge of how long he needs to perform HEP at 2-3 times per day, with occasional min A   Period Weeks   Status Not Met          SLP Long Term Goals - 02/17/16 1429      SLP LONG TERM GOAL #1   Title pt will demo knowledge of how long to perform HEP at 2-3 times per day, with rare min A   Time 1   Period --  therapy visits   Status New     SLP LONG TERM GOAL #2   Title pt will demo HEP for dysphagia with modified indpendence   Time 1   Period --  therapy visits   Status New     SLP LONG TERM GOAL #3   Title pt will tell SLP how a food journal can  assist return to PO diet   Time 1   Period --  therapy visits   Status New          Plan - 02/17/16 1425    Clinical Impression Statement Pt with s/s aspiration today with water. SLP suggested ice chips after thorough oral care. Pt's nutrition from PEG now. He is completing HEP appro x3/week. SLP re-eduated pt re: rationale for  HEP, and about muscle fibrosis. Skilled ST is necessary at this time to assess for proper HEP completion as well as to assess for safety with PO intake.   Speech Therapy Frequency --  approx every four weeks   Duration --  3 visits before 03-20-16   Treatment/Interventions Aspiration precaution training;Pharyngeal strengthening exercises;Trials of upgraded texture/liquids;Compensatory strategies;Patient/family education;Diet toleration management by SLP;SLP instruction and feedback   Potential to Achieve Goals Good      Patient will benefit from skilled therapeutic intervention in order to improve the following deficits and impairments:   Dysphagia, unspecified type    Problem List Patient Active Problem List   Diagnosis Date Noted  . Left-sided epistaxis 02/07/2016  . IDA (iron deficiency anemia) 02/07/2016  . Acute constipation 01/28/2016  . S/P gastrostomy ([place]) 01/20/2016  . Carcinoma of tonsillar fossa ([place]) 12/11/2015  . Tongue cancer ([place]) 12/03/2015  . S/P aortic valve replacement 12/03/2015  . Adenomatous colon polyp 07/10/2013  . Lower GI hemorrhage 07/01/2013  . Shock circulatory ([place]) 07/01/2013  . Acute on chronic renal failure ([place]) 06/30/2013  . Malnutrition of moderate degree ([place]) 06/27/2013  . Upper GI bleed 06/26/2013  . Anemia, blood loss 06/26/2013  . Pain in limb 06/05/2013  . Coronary atherosclerosis of native coronary artery 02/03/2013  . CKD (chronic kidney disease) stage 3, GFR 30-59 ml/min   . Aortic valve disorder   . PVD (peripheral vascular disease) ([place])   . HTN (hypertension)   . Chronic atrial fibrillation ([place])    . AAA (abdominal aortic aneurysm) ([place])   . History of bladder cancer   . Carotid stenosis     [facility] ,MS, CCC-SLP  02/17/2016, 2:30 PM  [facility] [address] [place], [place], [address] Phone: [phone]   Fax:  [phone]   Name: [name] MRN: [id number] Date of Birth: 04/13/1931

## 2016-02-17 NOTE — Addendum Note (Signed)
Encounter addended by: Jenene Slicker, RN on: 02/17/2016  1:13 PM<BR>    Actions taken: Order Entry activity accessed, Diagnosis association updated, MAR administration accepted

## 2016-02-18 ENCOUNTER — Ambulatory Visit
Admission: RE | Admit: 2016-02-18 | Discharge: 2016-02-18 | Disposition: A | Discharge status: Home / Self Care | Payer: Medicare Other | Source: Ambulatory Visit | Attending: Radiation Oncology | Admitting: Radiation Oncology

## 2016-02-18 ENCOUNTER — Ambulatory Visit (HOSPITAL_BASED_OUTPATIENT_CLINIC_OR_DEPARTMENT_OTHER): Payer: Medicare Other | Admitting: Hematology and Oncology

## 2016-02-18 ENCOUNTER — Encounter (HOSPITAL_COMMUNITY): Payer: Medicare Other

## 2016-02-18 ENCOUNTER — Encounter: Payer: Self-pay | Admitting: *Deleted

## 2016-02-18 ENCOUNTER — Ambulatory Visit (HOSPITAL_BASED_OUTPATIENT_CLINIC_OR_DEPARTMENT_OTHER): Payer: Medicare Other | Admitting: Nurse Practitioner

## 2016-02-18 ENCOUNTER — Encounter: Payer: Self-pay | Admitting: Hematology and Oncology

## 2016-02-18 ENCOUNTER — Telehealth: Payer: Self-pay | Admitting: Hematology and Oncology

## 2016-02-18 VITALS — BP 123/54 | HR 92 | Temp 97.5°F | Resp 18 | Ht 69.0 in | Wt 146.9 lb

## 2016-02-18 DIAGNOSIS — R040 Epistaxis: Secondary | ICD-10-CM | POA: Diagnosis not present

## 2016-02-18 DIAGNOSIS — C029 Malignant neoplasm of tongue, unspecified: Secondary | ICD-10-CM

## 2016-02-18 DIAGNOSIS — K5900 Constipation, unspecified: Secondary | ICD-10-CM | POA: Diagnosis not present

## 2016-02-18 DIAGNOSIS — E860 Dehydration: Secondary | ICD-10-CM

## 2016-02-18 DIAGNOSIS — D631 Anemia in chronic kidney disease: Secondary | ICD-10-CM

## 2016-02-18 DIAGNOSIS — N183 Chronic kidney disease, stage 3 unspecified: Secondary | ICD-10-CM

## 2016-02-18 DIAGNOSIS — D500 Iron deficiency anemia secondary to blood loss (chronic): Secondary | ICD-10-CM

## 2016-02-18 DIAGNOSIS — Z931 Gastrostomy status: Secondary | ICD-10-CM

## 2016-02-18 DIAGNOSIS — C090 Malignant neoplasm of tonsillar fossa: Secondary | ICD-10-CM

## 2016-02-18 DIAGNOSIS — Z510 Encounter for antineoplastic radiation therapy: Secondary | ICD-10-CM | POA: Diagnosis not present

## 2016-02-18 LAB — CBC WITH DIFFERENTIAL/PLATELET
BASO%: 0.5 % (ref 0.0–2.0)
BASOS ABS: 0 10*3/uL (ref 0.0–0.1)
EOS%: 6.7 % (ref 0.0–7.0)
Eosinophils Absolute: 0.4 10*3/uL (ref 0.0–0.5)
HCT: 32.6 % — ABNORMAL LOW (ref 38.4–49.9)
HGB: 10 g/dL — ABNORMAL LOW (ref 13.0–17.1)
LYMPH%: 9.7 % — AB (ref 14.0–49.0)
MCH: 32.8 pg (ref 27.2–33.4)
MCHC: 30.7 g/dL — AB (ref 32.0–36.0)
MCV: 106.9 fL — ABNORMAL HIGH (ref 79.3–98.0)
MONO#: 0.8 10*3/uL (ref 0.1–0.9)
MONO%: 13.1 % (ref 0.0–14.0)
NEUT#: 4.5 10*3/uL (ref 1.5–6.5)
NEUT%: 70 % (ref 39.0–75.0)
Platelets: 161 10*3/uL (ref 140–400)
RBC: 3.05 10*6/uL — AB (ref 4.20–5.82)
RDW: 17.4 % — ABNORMAL HIGH (ref 11.0–14.6)
WBC: 6.4 10*3/uL (ref 4.0–10.3)
lymph#: 0.6 10*3/uL — ABNORMAL LOW (ref 0.9–3.3)

## 2016-02-18 LAB — BASIC METABOLIC PANEL
ANION GAP: 11 meq/L (ref 3–11)
BUN: 84.7 mg/dL — ABNORMAL HIGH (ref 7.0–26.0)
CALCIUM: 9 mg/dL (ref 8.4–10.4)
CO2: 32 mEq/L — ABNORMAL HIGH (ref 22–29)
Chloride: 105 mEq/L (ref 98–109)
Creatinine: 2.1 mg/dL — ABNORMAL HIGH (ref 0.7–1.3)
EGFR: 29 mL/min/{1.73_m2} — AB (ref >90)
GLUCOSE: 174 mg/dL — AB (ref 70–140)
Potassium: 4.3 mEq/L (ref 3.5–5.1)
SODIUM: 148 meq/L — AB (ref 136–145)

## 2016-02-18 MED ORDER — LIDOCAINE-PRILOCAINE 2.5-2.5 % EX CREA: 1.0000 "application " | TOPICAL_CREAM | CUTANEOUS | 6 refills | Status: AC | PRN

## 2016-02-18 MED ORDER — HEPARIN SOD (PORK) LOCK FLUSH 100 UNIT/ML IV SOLN
500.0000 [IU] | Freq: Once | INTRAVENOUS | Status: AC | PRN
  Administered 2016-02-18: 500 [IU]
  Filled 2016-02-18: qty 5

## 2016-02-18 MED ORDER — SODIUM CHLORIDE 0.9% FLUSH
10.0000 mL | INTRAVENOUS | Status: DC | PRN
  Administered 2016-02-18: 10 mL
  Filled 2016-02-18: qty 10

## 2016-02-18 MED ORDER — SODIUM CHLORIDE 0.9 % IV SOLN
Freq: Once | INTRAVENOUS | Status: AC
  Administered 2016-02-18: 13:00:00 via INTRAVENOUS

## 2016-02-18 MED FILL — LIDOCAINE-PRILOCAINE CREAM: 2.5-2.5 | 10 days supply | Qty: 30 | Fill #0

## 2016-02-18 NOTE — Assessment & Plan Note (Signed)
This has resolved with aggressive laxatives. I reinforced the importance of regular laxative therapy

## 2016-02-18 NOTE — Assessment & Plan Note (Signed)
He had recent recurrence of epistaxis, likely related to reduce kidney function from dehydration It stopped with manual compression. He will continue anticoagulation to prevent stroke as directed

## 2016-02-18 NOTE — Patient Instructions (Signed)

## 2016-02-18 NOTE — Assessment & Plan Note (Signed)
He tolerated treatment with expected side effects I will continue to see him on a weekly basis for supportive care

## 2016-02-18 NOTE — Assessment & Plan Note (Signed)
He has worsening decline in kidney function due to dehydration. I discussed with the patient and his wife the risk and benefit of IV fluid hydration to improve kidney function and ultimately he agreed to proceed I will schedule IV fluids daily this week and recheck his blood work next week for further assessment

## 2016-02-18 NOTE — Telephone Encounter (Signed)
Appointments scheduled per 10/31 LOS. Patient given AVS report and calendar of future scheduled appointments. °

## 2016-02-18 NOTE — Progress Notes (Signed)
[facility] OFFICE PROGRESS NOTE  Patient Care Team: Lajean Manes, MD as PCP - General (Internal Medicine) Newt Minion, MD as Consulting Physician (Orthopedic Surgery) Heath Lark, MD as Consulting Physician (Hematology and Oncology) Jerrell Belfast, MD as Consulting Physician (Otolaryngology) Penni Bombard, MD as Consulting Physician (Neurology) Leota Sauers, RN as Oncology Nurse Navigator Eppie Gibson, MD as Attending Physician (Radiation Oncology) Karie Mainland, RD as Dietitian (Nutrition) Jettie Booze, MD as Consulting Physician (Cardiology)  SUMMARY OF ONCOLOGIC HISTORY:   Tongue cancer ([place])   08/29/2015 Imaging    Outside ECHO at [facility]: NORMAL LEFT VENTRICULAR FUNCTION WITH MILD LVH, NORMAL RIGHT VENTRICULAR SYSTOLIC FUNCTION, VALVULAR REGURGITATION: MODERATE AR, MILD MR, MILD PR, MILD TR VALVULAR STENOSIS: MILD MS MILD-MODERATE PARAVALVULAR AORTIC REGURGITATION      09/26/2015 Miscellaneous    He was seen by Dr. Wilburn Cornelia. Mild tongue fullness was noted and CT was recommended      11/13/2015 Imaging    MRI orbit showed Interval growth of left tonsillar mass centered at the glossal tonsillar sulcus and extending superiorly along the target mandibular raphe. There is no definite intracranial extension. Left level 2 and level 3 adenopathy       11/19/2015 Procedure    He underwent left lymph node FNA with local anaethetics      11/19/2015 Pathology Results     [id number] Biopsy confirmed squamous cell carcinoma      12/10/2015 PET scan    Intensely hypermetabolic left palatine tonsil/left glossotonsillar sulcus/left posterior tongue mass, consistent with primary head and neck carcinoma. Hypermetabolic left level 2 and left level 3 cervical nodal metastases. No definite hypermetabolic metastatic disease in the chest, abdomen or pelvis.      01/01/2016 Procedure    Successful placement of a right IJ approach Power Port with ultrasound and  fluoroscopic guidance. The catheter is ready for use.      01/01/2016 Procedure    Successful placement of a 20 French pull-through gastrostomy tube.      01/06/2016 -  Radiation Therapy    The patient received radiation treatment only without chemotherapy due to multiple other comorbidities       INTERVAL HISTORY: Please see below for problem oriented charting. He is seen today with his wife. The patient claimed he is able to hydrate himself but only drinking on average 3 cups of water along with 6 cans of Osmolite via feeding tube He denies constipation. He denies pain. No recent diarrhea. He is able to maintain his weight. He has blood drawn yesterday which show significant hypernatremia and signs of acute on chronic renal failure Yesterday, he had recurrent of epistaxis, resolved spontaneously He denies hematuria or hematochezia.  REVIEW OF SYSTEMS:   Constitutional: Denies fevers, chills or abnormal weight loss Eyes: Denies blurriness of vision Ears, nose, mouth, throat, and face: Denies mucositis or sore throat Respiratory: Denies cough, dyspnea or wheezes Cardiovascular: Denies palpitation, chest discomfort or lower extremity swelling Gastrointestinal:  Denies nausea, heartburn or change in bowel habits Skin: Denies abnormal skin rashes Lymphatics: Denies new lymphadenopathy or easy bruising Neurological:Denies numbness, tingling or new weaknesses Behavioral/Psych: Mood is stable, no new changes  All other systems were reviewed with the patient and are negative.  I have reviewed the past medical history, past surgical history, social history and family history with the patient and they are unchanged from previous note.  ALLERGIES:  is allergic to sulfa antibiotics and ramipril.  MEDICATIONS:  Current Outpatient Prescriptions  Medication Sig Dispense Refill  . acetaminophen (TYLENOL) 500 MG tablet Take 500 mg by mouth every 6 (six) hours as needed.    Marland Kitchen allopurinol  (ZYLOPRIM) 100 MG tablet Take 100 mg by mouth 2 (two) times daily.     Marland Kitchen amLODipine (NORVASC) 2.5 MG tablet TAKE 1 TABLET (2.5 MG TOTAL) BY MOUTH DAILY. 90 tablet 2  . apixaban (ELIQUIS) 2.5 MG TABS tablet Take 1 tablet (2.5 mg total) by mouth 2 (two) times daily. 180 tablet 3  . atorvastatin (LIPITOR) 20 MG tablet Take 20 mg by mouth daily.    . citric acid-sodium citrate (ORACIT) 334-500 MG/5ML solution Take 15 mLs by mouth every morning. 1 Tablespoon Daily    . Cyanocobalamin (VITAMIN B-12 IJ) Inject 1 each as directed every 30 (thirty) days.    Mariane Baumgarten Sodium (COLACE PO) Take 1 tablet by mouth daily as needed (FOR CONTIPATION).    . famotidine (TH FAMOTIDINE 10) 10 MG tablet Take 10 mg by mouth every morning.    . furosemide (LASIX) 40 MG tablet Take 1 tablet (40 mg total) by mouth every other day. 45 tablet 1  . gabapentin (NEURONTIN) 300 MG capsule Take 1-2 capsules (300-600 mg total) by mouth 3 (three) times daily. (Patient taking differently: Take 300 mg by mouth daily. ) 180 capsule 12  . lactulose (CHRONULAC) 10 GM/15ML solution Place 15 mLs (10 g total) into feeding tube daily. 240 mL 1  . lidocaine (XYLOCAINE) 2 % solution Swish and swallow 6mL of this mixture,31min before meals and at bedtime, up to QID 100 mL 5  . Nutritional Supplements (FEEDING SUPPLEMENT, OSMOLITE 1.5 CAL,) LIQD Osmolite 1.5 via PEG, 1 can QID with 60 cc free water before and after bolus feeding. Increase to 1.5 cans Osmolite 1.5 when tolerated. Patient should drink 24 oz water daily or put in PEG. 1422 mL 0  . polyethylene glycol (MIRALAX) packet Take 17 g by mouth daily. 14 each 1  . promethazine (PHENERGAN) 25 MG tablet Take 1 tablet (25 mg total) by mouth every 6 (six) hours as needed for nausea or vomiting. 40 tablet 5  . sodium fluoride (FLUORISHIELD) 1.1 % GEL dental gel Instill one drop of gel per tooth space of fluoride tray. Place over teeth for 5 minutes. Remove. Spit out excess. Repeat nightly. 120 mL  11  . sucralfate (CARAFATE) 1 g tablet Dissolve 1 tablet in 4mL H2O and swallow up to QID,PRN sore throat. 40 tablet 5  . Tiotropium Bromide Monohydrate (SPIRIVA HANDIHALER IN) Inhale 2 puffs into the lungs daily.    . traMADol (ULTRAM) 50 MG tablet Take by mouth every 6 (six) hours as needed.    . Wound Dressings (SONAFINE EX) Apply topically.    . lidocaine-prilocaine (EMLA) cream Apply 1 application topically as needed. 30 g 6   Current Facility-Administered Medications  Medication Dose Route Frequency Provider Last Rate Last Dose  . heparin lock flush 100 unit/mL  500 Units Intracatheter Once PRN Heath Lark, MD      . sodium chloride flush (NS) 0.9 % injection 10 mL  10 mL Intracatheter PRN Heath Lark, MD        PHYSICAL EXAMINATION: ECOG PERFORMANCE STATUS: 1 - Symptomatic but completely ambulatory  Vitals:   02/18/16 1223  BP: (!) 123/54  Pulse: 92  Resp: 18  Temp: 97.5 F (36.4 C)   Filed Weights   02/18/16 1223  Weight: 146 lb 14.4 oz (66.6 kg)    GENERAL:alert, no distress  and comfortable. He looks thin and frail SKIN: Noted dermatitis around his neck from radiation. No ulceration EYES: normal, Conjunctiva are pink and non-injected, sclera clear OROPHARYNX:no exudate, no erythema and lips, buccal mucosa, and tongue normal  NECK: supple, thyroid normal size, non-tender, without nodularity LYMPH:  no palpable lymphadenopathy in the cervical, axillary or inguinal LUNGS: clear to auscultation and percussion with normal breathing effort HEART: Irregular rate and rhythm with left systolic murmur, no lower extremity edema ABDOMEN:abdomen soft, non-tender and normal bowel sounds. No active bleeding around the feeding tube site Musculoskeletal:no cyanosis of digits and no clubbing  NEURO: alert & oriented x 3 with fluent speech, no focal motor/sensory deficits  LABORATORY DATA:  I have reviewed the data as listed    Component Value Date/Time   NA 148 (H) 02/18/2016 1118    K 4.3 02/18/2016 1118   CL 102 01/01/2016 0750   CO2 32 (H) 02/18/2016 1118   GLUCOSE 174 (H) 02/18/2016 1118   BUN 84.7 (H) 02/18/2016 1118   CREATININE 2.1 (H) 02/18/2016 1118   CALCIUM 9.0 02/18/2016 1118   PROT 7.3 02/07/2016 1246   ALBUMIN 2.8 (L) 02/07/2016 1246   AST 34 02/07/2016 1246   ALT 27 02/07/2016 1246   ALKPHOS 150 02/07/2016 1246   BILITOT 0.92 02/07/2016 1246   GFRNONAA 31 (L) 01/01/2016 0750   GFRAA 36 (L) 01/01/2016 0750    No results found for: SPEP, UPEP  Lab Results  Component Value Date   WBC 6.4 02/18/2016   NEUTROABS 4.5 02/18/2016   HGB 10.0 (L) 02/18/2016   HCT 32.6 (L) 02/18/2016   MCV 106.9 (H) 02/18/2016   PLT 161 02/18/2016      Chemistry      Component Value Date/Time   NA 148 (H) 02/18/2016 1118   K 4.3 02/18/2016 1118   CL 102 01/01/2016 0750   CO2 32 (H) 02/18/2016 1118   BUN 84.7 (H) 02/18/2016 1118   CREATININE 2.1 (H) 02/18/2016 1118      Component Value Date/Time   CALCIUM 9.0 02/18/2016 1118   ALKPHOS 150 02/07/2016 1246   AST 34 02/07/2016 1246   ALT 27 02/07/2016 1246   BILITOT 0.92 02/07/2016 1246       ASSESSMENT & PLAN:  Tongue cancer ([place]) He tolerated treatment with expected side effects I will continue to see him on a weekly basis for supportive care  Left-sided epistaxis He had recent recurrence of epistaxis, likely related to reduce kidney function from dehydration It stopped with manual compression. He will continue anticoagulation to prevent stroke as directed  CKD (chronic kidney disease) stage 3, GFR 30-59 ml/min He has worsening decline in kidney function due to dehydration. I discussed with the patient and his wife the risk and benefit of IV fluid hydration to improve kidney function and ultimately he agreed to proceed I will schedule IV fluids daily this week and recheck his blood work next week for further assessment  Acute constipation This has resolved with aggressive laxatives. I  reinforced the importance of regular laxative therapy  S/P gastrostomy ([place]) He had recent weight loss. Since he started to use his feeding tube, he has gained some weight. He has very mild intermittent bleeding around the feeding tube site which I suspect is related to chronic anticoagulation therapy. I recommend conservative management for now  Anemia, blood loss He has multifactorial anemia, related to chronic kidney disease and intermittent bleeding. He is not symptomatic. Continue monitoring   No orders of  the defined types were placed in this encounter.  All questions were answered. The patient knows to call the clinic with any problems, questions or concerns. No barriers to learning was detected. I spent 25 minutes counseling the patient face to face. The total time spent in the appointment was 40 minutes and more than 50% was on counseling and review of test results     Heath Lark, MD 02/18/2016 2:33 PM

## 2016-02-18 NOTE — Assessment & Plan Note (Signed)
He had recent weight loss. Since he started to use his feeding tube, he has gained some weight. He has very mild intermittent bleeding around the feeding tube site which I suspect is related to chronic anticoagulation therapy. I recommend conservative management for now

## 2016-02-18 NOTE — Progress Notes (Signed)
Oncology Nurse Navigator Documentation  Met with Mr. Maalouf and his wife following XRT, escorted him to Registration and appt with Dr. Alvy Bimler. He reported:  Eating small amounts by mouth, e.g icecream, applesauce; drinking about a cup of water daily.  Using PEG to instill 6 cans Osmolite and 2 cups water daily.  Had BM yesterday.  Neurologist has begun taper of Gabapentin.  He has tapered to 1 capsule this week, discontinues next week.  Had scant nose bleed yesterday. They voiced understanding today's labs reflect decreased kidney function, Dr. Alvy Bimler will schedule IVF for today, rest of this week.  If labs are improved on Monday, she may reduce frequency. I escorted him to [facility] for today's IVF.   Gayleen Orem, RN, BSN, [place] at [place] [phone]

## 2016-02-18 NOTE — Assessment & Plan Note (Signed)
He has multifactorial anemia, related to chronic kidney disease and intermittent bleeding. He is not symptomatic. Continue monitoring

## 2016-02-19 ENCOUNTER — Ambulatory Visit (HOSPITAL_BASED_OUTPATIENT_CLINIC_OR_DEPARTMENT_OTHER): Payer: Medicare Other

## 2016-02-19 ENCOUNTER — Encounter (HOSPITAL_COMMUNITY): Payer: Medicare Other

## 2016-02-19 ENCOUNTER — Other Ambulatory Visit: Payer: Self-pay

## 2016-02-19 ENCOUNTER — Telehealth: Payer: Self-pay | Admitting: *Deleted

## 2016-02-19 ENCOUNTER — Encounter: Payer: Self-pay | Admitting: *Deleted

## 2016-02-19 ENCOUNTER — Ambulatory Visit
Admission: RE | Admit: 2016-02-19 | Discharge: 2016-02-19 | Disposition: A | Discharge status: Home / Self Care | Payer: Medicare Other | Source: Ambulatory Visit | Attending: Radiation Oncology | Admitting: Radiation Oncology

## 2016-02-19 VITALS — BP 142/60 | HR 84 | Temp 98.4°F | Resp 18

## 2016-02-19 DIAGNOSIS — N183 Chronic kidney disease, stage 3 unspecified: Secondary | ICD-10-CM

## 2016-02-19 DIAGNOSIS — Z510 Encounter for antineoplastic radiation therapy: Secondary | ICD-10-CM | POA: Diagnosis not present

## 2016-02-19 DIAGNOSIS — C029 Malignant neoplasm of tongue, unspecified: Secondary | ICD-10-CM | POA: Diagnosis not present

## 2016-02-19 DIAGNOSIS — E860 Dehydration: Secondary | ICD-10-CM | POA: Diagnosis present

## 2016-02-19 MED ORDER — SODIUM CHLORIDE 0.9 % IV SOLN
Freq: Once | INTRAVENOUS | Status: AC
  Administered 2016-02-19: 13:00:00 via INTRAVENOUS

## 2016-02-19 MED ORDER — SODIUM CHLORIDE 0.9% FLUSH
10.0000 mL | INTRAVENOUS | Status: DC | PRN
  Administered 2016-02-19: 10 mL
  Filled 2016-02-19: qty 10

## 2016-02-19 MED ORDER — SODIUM CHLORIDE 0.9 % IV SOLN: Freq: Once | INTRAVENOUS | Status: DC

## 2016-02-19 MED ORDER — ONDANSETRON HCL 4 MG/2ML IJ SOLN: 4.0000 mg | Freq: Once | INTRAMUSCULAR | Status: DC

## 2016-02-19 MED ORDER — HEPARIN SOD (PORK) LOCK FLUSH 100 UNIT/ML IV SOLN
250.0000 [IU] | Freq: Once | INTRAVENOUS | Status: DC | PRN
  Filled 2016-02-19: qty 5

## 2016-02-19 MED ORDER — ALTEPLASE 2 MG IJ SOLR
2.0000 mg | Freq: Once | INTRAMUSCULAR | Status: DC | PRN
  Filled 2016-02-19: qty 2

## 2016-02-19 MED ORDER — HEPARIN SOD (PORK) LOCK FLUSH 100 UNIT/ML IV SOLN
500.0000 [IU] | Freq: Once | INTRAVENOUS | Status: AC | PRN
  Administered 2016-02-19: 500 [IU]
  Filled 2016-02-19: qty 5

## 2016-02-19 NOTE — Telephone Encounter (Signed)
Per LOS I have scheduled appts and notified the scheduler 

## 2016-02-19 NOTE — Patient Instructions (Signed)

## 2016-02-19 NOTE — Progress Notes (Signed)
Oncology Nurse Navigator Documentation  Received call from Mrs. [name], indicated OK to apply [place] to [facility] prior to RT and instill PEG nutrition if receiving IVF.  Guided her to come to [facility] following RT for Exam Room where she can instill supplement.  Gayleen Orem, RN, BSN, [facility] at [place] [phone]

## 2016-02-20 ENCOUNTER — Encounter (HOSPITAL_COMMUNITY): Payer: Medicare Other

## 2016-02-20 ENCOUNTER — Ambulatory Visit
Admission: RE | Admit: 2016-02-20 | Discharge: 2016-02-20 | Disposition: A | Discharge status: Home / Self Care | Payer: Medicare Other | Source: Ambulatory Visit | Attending: Radiation Oncology | Admitting: Radiation Oncology

## 2016-02-20 ENCOUNTER — Ambulatory Visit (HOSPITAL_BASED_OUTPATIENT_CLINIC_OR_DEPARTMENT_OTHER): Payer: Medicare Other | Admitting: Nurse Practitioner

## 2016-02-20 ENCOUNTER — Other Ambulatory Visit: Payer: Self-pay

## 2016-02-20 VITALS — BP 128/42 | HR 90 | Temp 97.8°F | Resp 28 | Wt 147.9 lb

## 2016-02-20 DIAGNOSIS — N183 Chronic kidney disease, stage 3 unspecified: Secondary | ICD-10-CM

## 2016-02-20 DIAGNOSIS — C029 Malignant neoplasm of tongue, unspecified: Secondary | ICD-10-CM | POA: Diagnosis not present

## 2016-02-20 DIAGNOSIS — Z510 Encounter for antineoplastic radiation therapy: Secondary | ICD-10-CM | POA: Diagnosis not present

## 2016-02-20 DIAGNOSIS — E860 Dehydration: Secondary | ICD-10-CM | POA: Diagnosis present

## 2016-02-20 MED ORDER — SODIUM CHLORIDE 0.9% FLUSH
10.0000 mL | INTRAVENOUS | Status: DC | PRN
  Administered 2016-02-20: 10 mL
  Filled 2016-02-20: qty 10

## 2016-02-20 MED ORDER — HEPARIN SOD (PORK) LOCK FLUSH 100 UNIT/ML IV SOLN
500.0000 [IU] | Freq: Once | INTRAVENOUS | Status: AC | PRN
  Administered 2016-02-20: 500 [IU]
  Filled 2016-02-20: qty 5

## 2016-02-20 MED ORDER — SODIUM CHLORIDE 0.9 % IV SOLN
Freq: Once | INTRAVENOUS | Status: AC
  Administered 2016-02-20: 13:00:00 via INTRAVENOUS

## 2016-02-21 ENCOUNTER — Other Ambulatory Visit: Payer: Self-pay

## 2016-02-21 ENCOUNTER — Ambulatory Visit
Admission: RE | Admit: 2016-02-21 | Discharge: 2016-02-21 | Disposition: A | Discharge status: Home / Self Care | Payer: Medicare Other | Source: Ambulatory Visit | Attending: Radiation Oncology | Admitting: Radiation Oncology

## 2016-02-21 ENCOUNTER — Ambulatory Visit (HOSPITAL_BASED_OUTPATIENT_CLINIC_OR_DEPARTMENT_OTHER): Payer: Medicare Other | Admitting: Nurse Practitioner

## 2016-02-21 ENCOUNTER — Other Ambulatory Visit: Payer: Self-pay | Admitting: *Deleted

## 2016-02-21 ENCOUNTER — Ambulatory Visit: Payer: Medicare Other

## 2016-02-21 VITALS — BP 131/55 | HR 82 | Temp 97.7°F | Resp 17 | Ht 69.0 in | Wt 148.3 lb

## 2016-02-21 DIAGNOSIS — C029 Malignant neoplasm of tongue, unspecified: Secondary | ICD-10-CM

## 2016-02-21 DIAGNOSIS — N183 Chronic kidney disease, stage 3 unspecified: Secondary | ICD-10-CM

## 2016-02-21 DIAGNOSIS — Z510 Encounter for antineoplastic radiation therapy: Secondary | ICD-10-CM | POA: Diagnosis not present

## 2016-02-21 DIAGNOSIS — E860 Dehydration: Secondary | ICD-10-CM | POA: Diagnosis present

## 2016-02-21 DIAGNOSIS — R197 Diarrhea, unspecified: Secondary | ICD-10-CM

## 2016-02-21 MED ORDER — HEPARIN SOD (PORK) LOCK FLUSH 100 UNIT/ML IV SOLN
250.0000 [IU] | Freq: Once | INTRAVENOUS | Status: DC | PRN
  Filled 2016-02-21: qty 5

## 2016-02-21 MED ORDER — HEPARIN SOD (PORK) LOCK FLUSH 100 UNIT/ML IV SOLN
500.0000 [IU] | Freq: Once | INTRAVENOUS | Status: AC | PRN
  Administered 2016-02-21: 500 [IU]
  Filled 2016-02-21: qty 5

## 2016-02-21 MED ORDER — LOPERAMIDE HCL 2 MG PO CAPS
4.0000 mg | ORAL_CAPSULE | Freq: Once | ORAL | Status: AC
  Administered 2016-02-21: 4 mg via ORAL

## 2016-02-21 MED ORDER — LOPERAMIDE HCL 2 MG PO CAPS
ORAL_CAPSULE | ORAL | Status: AC
Start: 2016-02-21 — End: 2016-02-21
  Filled 2016-02-21: qty 2

## 2016-02-21 MED ORDER — SODIUM CHLORIDE 0.9 % IV SOLN
Freq: Once | INTRAVENOUS | Status: AC
Start: 2016-02-21 — End: 2016-02-21
  Administered 2016-02-21: 13:00:00 via INTRAVENOUS

## 2016-02-21 MED ORDER — SODIUM CHLORIDE 0.9% FLUSH
10.0000 mL | INTRAVENOUS | Status: DC | PRN
  Administered 2016-02-21: 10 mL
  Filled 2016-02-21: qty 10

## 2016-02-21 NOTE — Patient Instructions (Signed)

## 2016-02-23 ENCOUNTER — Emergency Department (HOSPITAL_COMMUNITY)
Admission: EM | Admit: 2016-02-23 | Discharge: 2016-03-20 | Disposition: E | Payer: Medicare Other | Attending: Emergency Medicine | Admitting: Emergency Medicine

## 2016-02-23 DIAGNOSIS — Z8551 Personal history of malignant neoplasm of bladder: Secondary | ICD-10-CM | POA: Diagnosis not present

## 2016-02-23 DIAGNOSIS — Z85810 Personal history of malignant neoplasm of tongue: Secondary | ICD-10-CM | POA: Insufficient documentation

## 2016-02-23 DIAGNOSIS — Z87891 Personal history of nicotine dependence: Secondary | ICD-10-CM | POA: Diagnosis not present

## 2016-02-23 DIAGNOSIS — N183 Chronic kidney disease, stage 3 (moderate): Secondary | ICD-10-CM | POA: Insufficient documentation

## 2016-02-23 DIAGNOSIS — Z951 Presence of aortocoronary bypass graft: Secondary | ICD-10-CM | POA: Insufficient documentation

## 2016-02-23 DIAGNOSIS — I469 Cardiac arrest, cause unspecified: Secondary | ICD-10-CM | POA: Diagnosis not present

## 2016-02-23 DIAGNOSIS — I129 Hypertensive chronic kidney disease with stage 1 through stage 4 chronic kidney disease, or unspecified chronic kidney disease: Secondary | ICD-10-CM | POA: Insufficient documentation

## 2016-02-23 DIAGNOSIS — Z85038 Personal history of other malignant neoplasm of large intestine: Secondary | ICD-10-CM | POA: Insufficient documentation

## 2016-02-23 DIAGNOSIS — I2510 Atherosclerotic heart disease of native coronary artery without angina pectoris: Secondary | ICD-10-CM | POA: Insufficient documentation

## 2016-02-24 ENCOUNTER — Ambulatory Visit: Payer: Medicare Other | Admitting: Radiation Oncology

## 2016-02-24 ENCOUNTER — Ambulatory Visit: Payer: Medicare Other

## 2016-02-24 ENCOUNTER — Other Ambulatory Visit: Payer: Self-pay

## 2016-02-24 ENCOUNTER — Ambulatory Visit: Payer: Self-pay | Admitting: Hematology and Oncology

## 2016-02-24 ENCOUNTER — Ambulatory Visit: Payer: Self-pay | Admitting: Nurse Practitioner

## 2016-02-25 ENCOUNTER — Encounter (HOSPITAL_COMMUNITY): Payer: Medicare Other

## 2016-02-25 ENCOUNTER — Ambulatory Visit: Payer: Self-pay | Admitting: Nurse Practitioner

## 2016-02-25 ENCOUNTER — Ambulatory Visit: Payer: Medicare Other

## 2016-02-26 ENCOUNTER — Ambulatory Visit: Payer: Self-pay | Admitting: Nurse Practitioner

## 2016-02-26 ENCOUNTER — Encounter: Payer: Self-pay | Admitting: Radiation Oncology

## 2016-02-26 ENCOUNTER — Encounter (HOSPITAL_COMMUNITY): Payer: Medicare Other

## 2016-02-26 NOTE — Progress Notes (Deleted)
°  Radiation Oncology         [phone] ________________________________  Name: [name] MRN: [id number]  Date: 02/26/2016  DOB: [birth year]  End of Treatment Note  Diagnosis:  Carcinoma of tonsillar fossa    Indication for treatment:  Curative      Radiation treatment dates:   01/06/16-02/21/16  Site/dose:   Left  Tonsil/tongue/ bl neck / 68 Gy in 34 fx  Beams/energy:   IMRT / 6x  Narrative: Before receiving his final fraction of radiotherapy, Dr. Isidore Moos was informed that the patient died unexpectedly. Dr. Isidore Moos spoke with his wife by phone to offer her condolences. A card was also sent to his family. -----------------------------------  Parker Gibson, MD  This document serves as a record of services personally performed by Parker Gibson, MD. It was created on her behalf by Bethann Humble, a trained medical scribe. The creation of this record is based on the scribe's personal observations and the provider's statements to them. This document has been checked and approved by the attending provider.

## 2016-02-27 ENCOUNTER — Encounter (HOSPITAL_COMMUNITY): Payer: Medicare Other

## 2016-02-27 NOTE — Therapy (Signed)
[facility] [address] [place], [place], [address] Phone: [phone]   Fax:  [phone]  Patient Details  Name: [name] MRN: [id number] Date of Birth: Nov 06, 1930 Referring Provider:  No ref. provider found  Encounter Date: 02/27/2016   Pt will be discharged from skilled ST services at this time due to: pt expired.  He had undergone two ST sessions focusing on oral and pharyngeal swallowing prophylaxis. No goals met.  [facility] ,[place], [place]  02/27/2016, 8:16 AM  [facility] [address] [place], [place], [address] Phone: [phone]   Fax:  [phone]

## 2016-02-28 ENCOUNTER — Ambulatory Visit: Payer: Self-pay | Admitting: Nurse Practitioner

## 2016-03-02 ENCOUNTER — Ambulatory Visit: Payer: Medicare Other | Admitting: Radiation Oncology

## 2016-03-03 ENCOUNTER — Encounter (HOSPITAL_COMMUNITY): Payer: Medicare Other

## 2016-03-04 ENCOUNTER — Encounter (HOSPITAL_COMMUNITY): Payer: Medicare Other

## 2016-03-05 ENCOUNTER — Encounter (HOSPITAL_COMMUNITY): Payer: Medicare Other

## 2016-03-10 ENCOUNTER — Encounter (HOSPITAL_COMMUNITY): Payer: Medicare Other

## 2016-03-10 NOTE — Progress Notes (Signed)
Name: [name] MRN: [id number]  Date: 02/26/2016  DOB: 28-Oct-1930  RADIATION ONCOLOGY End of Treatment Note  Diagnosis:  Carcinoma of tonsillar fossa    Indication for treatment:  Curative      Radiation treatment dates:   01/06/16-02/21/16  Site/dose:   Left  Tonsil/tongue/ bl neck / 68 Gy in 34 fx  Beams/energy:   IMRT / 6x  Narrative: Before receiving his final fraction of radiotherapy, Dr. Isidore Moos was informed that the patient died unexpectedly. Dr. Isidore Moos spoke with his wife by phone to offer her condolences. A card was also sent to his family. -----------------------------------  Eppie Gibson, MD

## 2016-03-11 ENCOUNTER — Encounter (HOSPITAL_COMMUNITY): Payer: Medicare Other

## 2016-03-17 ENCOUNTER — Encounter (HOSPITAL_COMMUNITY): Payer: Medicare Other

## 2016-03-18 ENCOUNTER — Encounter (HOSPITAL_COMMUNITY): Payer: Medicare Other

## 2016-03-19 ENCOUNTER — Encounter (HOSPITAL_COMMUNITY): Payer: Medicare Other

## 2016-03-20 NOTE — Code Documentation (Signed)
Patient time of death occurred at 73.

## 2016-03-20 NOTE — ED Provider Notes (Addendum)
Emergency Department Provider Note   I have reviewed the triage vital signs and the nursing notes.   HISTORY  Chief Complaint cpr   HPI MALECHI GALLENTINE is a 82 y.o. male with PMH of AAA, bladder cancer, atrial flutter, CKD, HTN, HLD presents to the emergency department in cardiac arrest. The patient was found by family unresponsive. Unknown down time per EMS. Presenting rhythm was PEA. The patient was warm to touch. CPR and ACLS was initiated. The patient had 8 rounds of epinephrine by EMS in the field. They report end tidal CO2 no greater than 20 in the field. The patient's wife reversed the patient's advanced directive and full code was initiated.   P[place]et is intubated with active CPR. No history or ROS provided.   Past Medical History:  Diagnosis Date  . AAA (abdominal aortic aneurysm) ([place])   . Acute polyneuropathy related to gastric bypass surgery [facility]) 2001   Quintuple bypass surgery  . Aortic valve disorder    s/p TAVR at [facility]   . Atrial fibrillation (HCC)    Chronic  . Atrial flutter ([place])   . Bell's palsy   . Bell's palsy   . Bladder cancer ([place])   . CAD (coronary artery disease)    Status post CABG, stent to diagonal prior to TAVR  . Cancer ([place])    basal cell  . Carotid stenosis   . CKD (chronic kidney disease)   . Colon cancer ([place])   . Deviated septum 1950?  . Gallstone   . Gout   . H/O laminectomy 1951   Staph Septicemia  . HTN (hypertension)   . Hypercholesteremia   . PVD (peripheral vascular disease) ([place])   . Vitamin B12 deficiency     Patient Active Problem List   Diagnosis Date Noted  . Dehydration 02/18/2016  . Left-sided epistaxis 02/07/2016  . IDA (iron deficiency anemia) 02/07/2016  . Acute constipation 01/28/2016  . S/P gastrostomy ([place]) 01/20/2016  . Carcinoma of tonsillar fossa ([place]) 12/11/2015  . Tongue cancer ([place]) 12/03/2015  . S/P aortic valve replacement 12/03/2015  . Adenomatous colon polyp 07/10/2013  . Lower GI  hemorrhage 07/01/2013  . Shock circulatory ([place]) 07/01/2013  . Acute on chronic renal failure ([place]) 06/30/2013  . Malnutrition of moderate degree ([place]) 06/27/2013  . Upper GI bleed 06/26/2013  . Anemia, blood loss 06/26/2013  . Pain in limb 06/05/2013  . Coronary atherosclerosis of native coronary artery 02/03/2013  . CKD (chronic kidney disease) stage 3, GFR 30-59 ml/min   . Aortic valve disorder   . PVD (peripheral vascular disease) ([place])   . HTN (hypertension)   . Chronic atrial fibrillation ([place])   . AAA (abdominal aortic aneurysm) ([place])   . History of bladder cancer   . Carotid stenosis     Past Surgical History:  Procedure Laterality Date  . ANGIOPLASTY  04/2002  . AORTIC VALVE REPLACEMENT  08/2010  . APPENDECTOMY    . BLADDER SURGERY  02/2001   neo bladder replaced cancerous bladder, prostate removed  . cataract surgery Bilateral 07/25/09, 08/15/09  . COLECTOMY  07/2002   colon cancer  . COLONOSCOPY N/A 06/30/2013   Procedure: COLONOSCOPY;  Surgeon: Winfield Cunas., MD;  Location[facility] ENDOSCOPY;  Service: Endoscopy;  Laterality: N/A;  . COLONOSCOPY WITH ESOPHAGOGASTRODUODENOSCOPY (EGD) N/A 07/03/2013   Procedure: COLONOSCOPY ;  Surgeon: Missy Sabins, MD;  Location: [facility];  Service: Endoscopy;  Laterality: N/A;  Patient is already sedated on the ventilator  and may need minimal if any additional sedation.   . CORONARY ARTERY BYPASS GRAFT  02/2000  . ESOPHAGOGASTRODUODENOSCOPY N/A 06/28/2013   Procedure: ESOPHAGOGASTRODUODENOSCOPY (EGD);  Surgeon: Winfield Cunas., MD;  Location: [facility] ENDOSCOPY;  Service: Endoscopy;  Laterality: N/A;  . ESOPHAGOGASTRODUODENOSCOPY N/A 07/03/2013   Procedure: ESOPHAGOGASTRODUODENOSCOPY (EGD);  Surgeon: Missy Sabins, MD;  Location[facility] ENDOSCOPY;  Service: Endoscopy;  Laterality: N/A;  . GIVENS CAPSULE STUDY N/A 07/03/2013   Procedure: GIVENS CAPSULE STUDY;  Surgeon: Missy Sabins, MD;  Location: [facility];  Service: Endoscopy;  Laterality:  N/A;  . HEMICOLECTOMY    . IR GENERIC HISTORICAL  01/01/2016   IR FLUORO GUIDE PORT INSERTION RIGHT 01/01/2016 Jacqulynn Cadet, MD [facility]  . IR GENERIC HISTORICAL  01/01/2016   IR US GUIDE VASC ACCESS RIGHT 01/01/2016 Jacqulynn Cadet, MD [facility]  . IR GENERIC HISTORICAL  01/01/2016   IR GASTROSTOMY TUBE MOD SED 01/01/2016 Jacqulynn Cadet, MD [facility]  . LAMINECTOMY    . MOHS SURGERY  09/2005   nose  . TOE SURGERY      Current Outpatient Rx  . Order #: [id number] Class: Historical Med  . Order #: [id number] Class: Historical Med  . Order #: [id number] Class: Normal  . Order #: [id number] Class: Normal  . Order #: [id number] Class: Historical Med  . Order #: [id number] Class: Historical Med  . Order #: [id number] Class: Historical Med  . Order #: [id number] Class: Historical Med  . Order #: [id number] Class: Historical Med  . Order #: [id number] Class: Normal  . Order #: [id number] Class: Normal  . Order #: [id number] Class: Normal  . Order #: [id number] Class: Normal  . Order #: [id number] Class: Normal  . Order #: [id number] Class: No Print  . Order #: [id number] Class: Normal  . Order #: [id number] Class: Normal  . Order #: [id number] Class: Normal  . Order #: [id number] Class: Normal  . Order #: [id number] Class: Historical Med  . Order #: [id number] Class: Historical Med  . Order #: [id number] Class: Historical Med    Allergies Sulfa antibiotics and Ramipril  Family History  Problem Relation Age of Onset  . Cancer Mother     Colon cancer  . Heart disease Father   . Diabetes Father   . Cancer Father   . Cancer Brother     esophageal    Social History Social History  Substance Use Topics  . Smoking status: Former Smoker    Packs/day: 3.00    Years: 32.00    Quit date: 08/02/1982  . Smokeless tobacco: Never Used  . Alcohol use Yes     Comment: occasional    Review of Systems  Patient with active CPR in progress. No ROS.   ____________________________________________   PHYSICAL  EXAM:  VITAL SIGNS: EtCO2: 11 Pulse: 0 BP: undetectable  Constitutional: CPR in progress.  Eyes: pupils are 4 mm and fixed.  Head: Atraumatic. Mouth/Throat: King airway in place.  Neck: No expanding hematoma or mass.  Cardiovascular: CPR in progress.    Respiratory: Bag ventilation via king airway. Easy to bag.  Gastrointestinal: Distended.  Musculoskeletal: Cool to touch. No obvious deformity.  Neurologic:  GCS 2T. CPR in progress.  Skin:  Skin is cool and clammy.    ____________________________________________   LABS (all labs ordered are listed, but only abnormal results are displayed)  None  ____________________________________________  EKG  None ____________________________________________  RADIOLOGY  None ____________________________________________   PROCEDURES  Procedure(s) performed:   Procedures  CPR  ____________________________________________   INITIAL IMPRESSION / ASSESSMENT AND PLAN / ED COURSE  Pertinent  labs & imaging results that were available during my care of the patient were reviewed by me and considered in my medical decision making (see chart for details).  Patient arrives with CPR in progress. Unknown down time. Multiple rounds of epinephrine given in the field. Gave her 1 additional round of epinephrine and bicarbonate with no response. The patient's wife arrived at bedside and we discussed the case. During the next pulse check the patient had no pulse and was in asystole. Bedside ultrasound showed absence of cardiac activity. No other obvious source for arrest that would be easily reversible. The patient's time of death was pronounced at 18:50.   07:16 PM Spoke with the on-call physician for Dr. Felipa Eth, Dr. Inda Merlin. He will pass along the news of the patient's passing and ask that he complete the death certificate. Appreciate their assistance.  ____________________________________________  FINAL CLINICAL IMPRESSION(S) / ED  DIAGNOSES  Final diagnoses:  Cardiac arrest ([place])     MEDICATIONS GIVEN DURING THIS VISIT:  None  NEW OUTPATIENT MEDICATIONS STARTED DURING THIS VISIT:  None   Note:  This document was prepared using Dragon voice recognition software and may include unintentional dictation errors.  Nanda Quinton, MD Emergency Medicine   Margette Fast, MD 02/24/16 [employer] [employer], MD 02/24/16 [phone]

## 2016-03-20 NOTE — Code Documentation (Signed)
Family updated as to patient's status.

## 2016-03-20 NOTE — Code Documentation (Addendum)
1850 Time of Death, Called by EDP Nanda Quinton MD

## 2016-03-20 NOTE — Code Documentation (Addendum)
EDP [facility] MD at bedside verbal order 1 mg Epi and 1 Bicarb received and acknowledged.

## 2016-03-20 NOTE — Progress Notes (Addendum)
Chaplain paged to provide spiritual care and comfort to the widow of Mr Gilleland, who had died. Mr Saucerman died at home, but was transported to [facility] to be sure. CPR was done with no positive result. Ms Matthes, the widow, stated that the couple were [employer], congregants at [employer]. She asked if a priest could be called to provide comfort and last rites. The parish was called and Msgr. Marcaccio responded to the call. After this Ms Wools was given numbers to call when she and her children decide on a funeral home. She left in the company of the neighbor who brought her to the hospital.  Grief Comfort provided.  Sallee Lange. Tajanae Guilbault, [place]

## 2016-03-20 NOTE — Code Documentation (Signed)
Family at beside. Family given emotional support. 

## 2016-03-20 DEATH — deceased

## 2016-03-24 ENCOUNTER — Encounter (HOSPITAL_COMMUNITY): Payer: Self-pay

## 2016-03-25 ENCOUNTER — Encounter (HOSPITAL_COMMUNITY): Payer: Self-pay

## 2016-03-26 ENCOUNTER — Encounter (HOSPITAL_COMMUNITY): Payer: Self-pay

## 2016-03-28 ENCOUNTER — Other Ambulatory Visit: Payer: Self-pay | Admitting: Nurse Practitioner

## 2016-03-31 ENCOUNTER — Encounter (HOSPITAL_COMMUNITY): Payer: Self-pay

## 2016-04-01 ENCOUNTER — Encounter (HOSPITAL_COMMUNITY): Payer: Self-pay

## 2016-04-02 ENCOUNTER — Encounter (HOSPITAL_COMMUNITY): Payer: Self-pay

## 2016-04-07 ENCOUNTER — Ambulatory Visit: Payer: Self-pay | Admitting: Interventional Cardiology

## 2016-04-07 ENCOUNTER — Encounter (HOSPITAL_COMMUNITY): Payer: Self-pay

## 2016-04-08 ENCOUNTER — Encounter (HOSPITAL_COMMUNITY): Payer: Self-pay

## 2016-04-09 ENCOUNTER — Encounter (HOSPITAL_COMMUNITY): Payer: Self-pay

## 2016-04-14 ENCOUNTER — Encounter (HOSPITAL_COMMUNITY): Payer: Self-pay

## 2016-04-15 ENCOUNTER — Encounter (HOSPITAL_COMMUNITY): Payer: Self-pay

## 2016-04-16 ENCOUNTER — Encounter (HOSPITAL_COMMUNITY): Payer: Self-pay

## 2018-04-15 IMAGING — CT NM PET TUM IMG INITIAL (PI) SKULL BASE T - THIGH
8 series · 18 of 25 positions shown · non-contrast
Comparison: 07/31/2015 unenhanced CT abdomen/pelvis.

CLINICAL DATA: Initial treatment strategy for tongue cancer.
History of bladder cancer status post radical cystoprostatectomy and
neobladder creation in 1441. Additional history of colectomy in 3997
for colon cancer. Appendectomy.

EXAM:
NUCLEAR MEDICINE PET SKULL BASE TO THIGH
TECHNIQUE: 7.3 mCi F-18 FDG was injected intravenously. Full-ring PET imaging
was performed from the skull base to thigh after the radiotracer. CT
data was obtained and used for attenuation correction and anatomic
localization.
FASTING BLOOD GLUCOSE:  Value: 102 mg/dl

[Series 3: pet hn_sk_thigh ac · axial · 5.0mm · 4.07mm/px · z∈[-984,-120]mm · 3 of 217 slices shown]
[im 1/217]
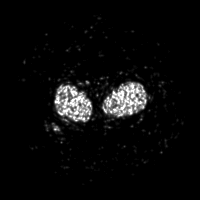
[im 145/217]
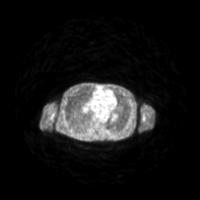
[im 217/217]
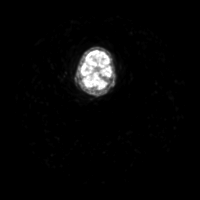

[Series 4: ct hn_sk_th 5.0 hd_fov · axial · 5.0mm · 1.27mm/px · z∈[-984,-336]mm · 3 of 217 slices shown]
[im 1/217]
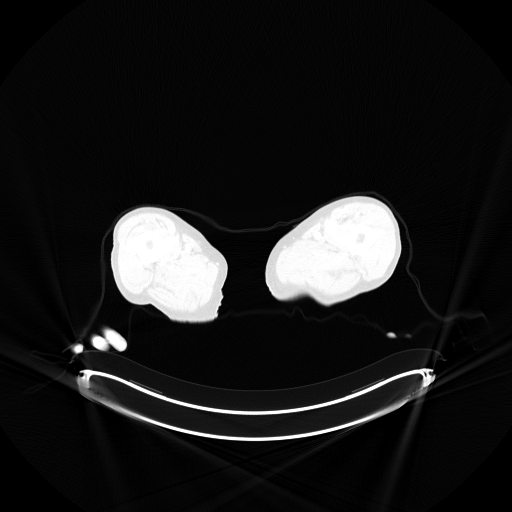
[im 109/217]
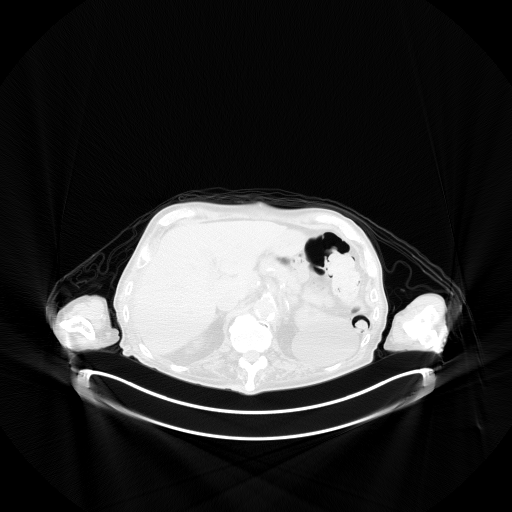
[im 163/217]
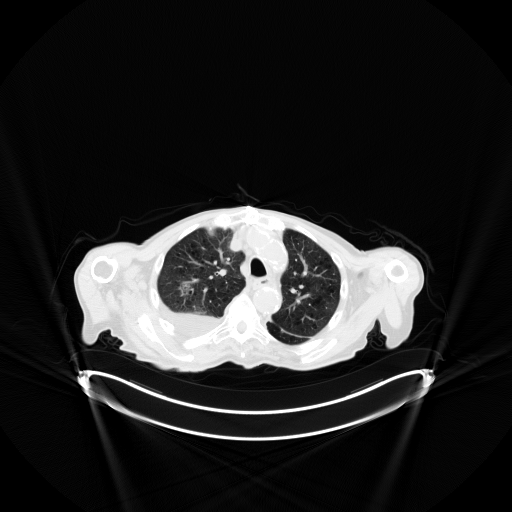

[Series 7: pet hn_sk_thigh nac · axial · 5.0mm · 4.07mm/px · z∈[-984,-120]mm · 4 of 217 slices shown]
[im 1/217]
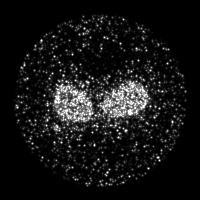
[im 55/217]
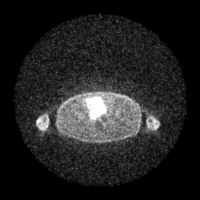
[im 109/217]
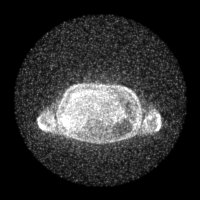
[im 217/217]
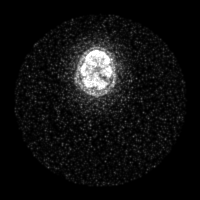

[Series 8: ct hn_sk_th 5.0 b70f (id)_bone · axial · 5.0mm · 0.73mm/px · z∈[-554,-238]mm · 2 of 80 slices shown]
[im 1/80  bone]
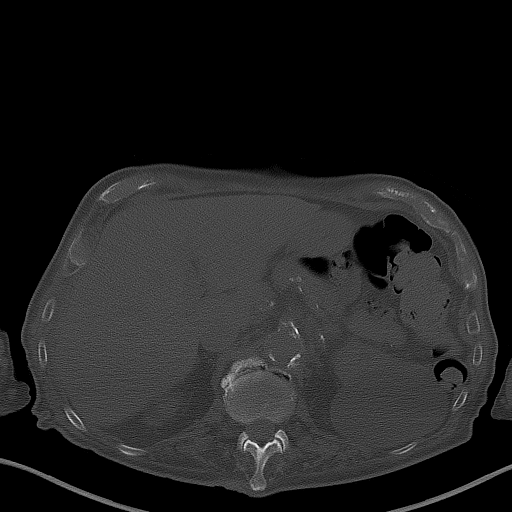
[im 80/80  bone]
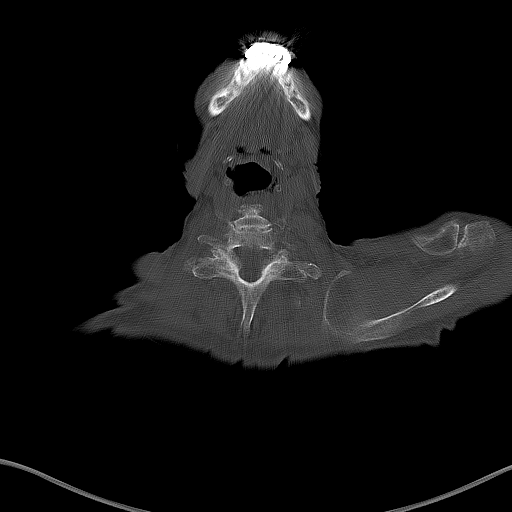

[Series 603: range-ct hn_sk_th 5.0 hd_fov-cor-<alpha range> · 1 of 93 slices shown]
[im 93/93]
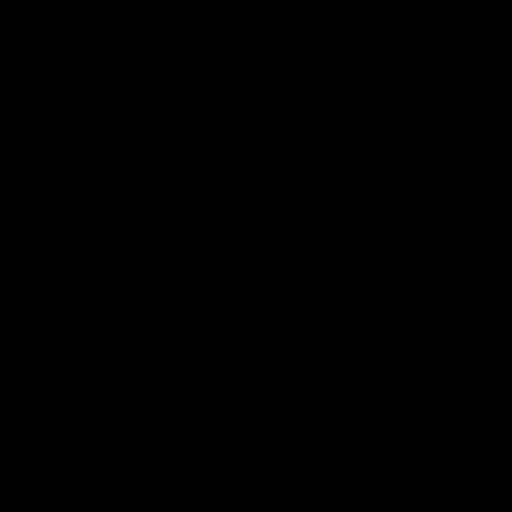

[Series 604: mip collection · coronal · 1.79mm/px · 1 of 32 slices shown]
[im 1/32]
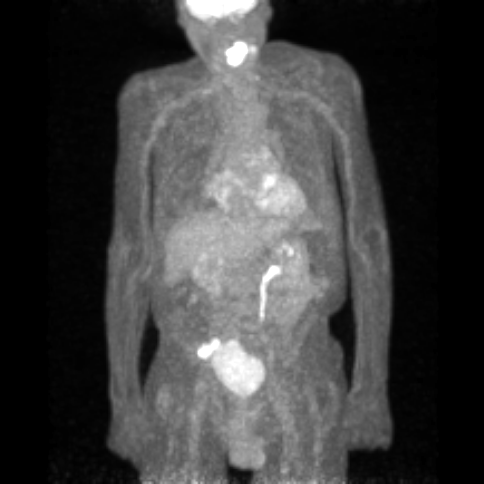

[Series 605: range-ct hn_sk_th 5.0 hd_fov-tra-<alpha range> · 3 of 208 slices shown]
[im 52/208]
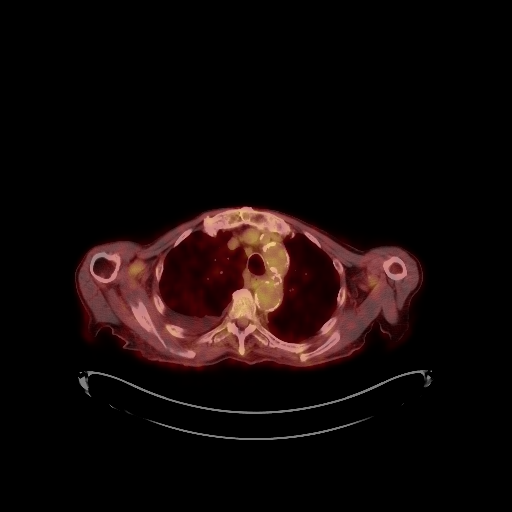
[im 104/208]
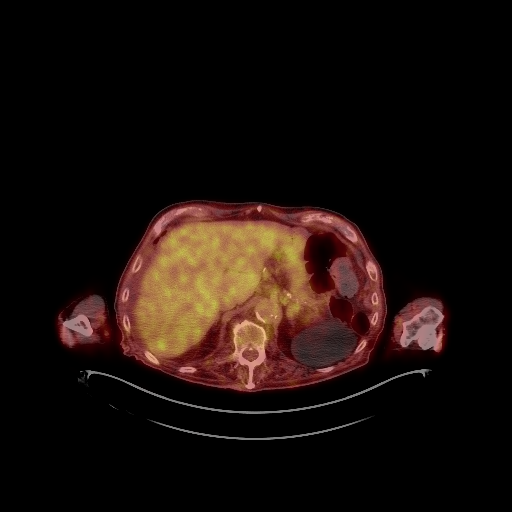
[im 156/208]
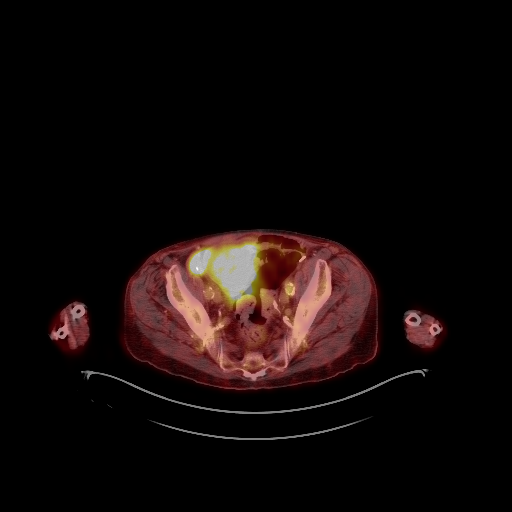

[Series 1032: results mm oncology reading · 0.58mm/px · 1 of 6 slices shown]
[im 1/6]
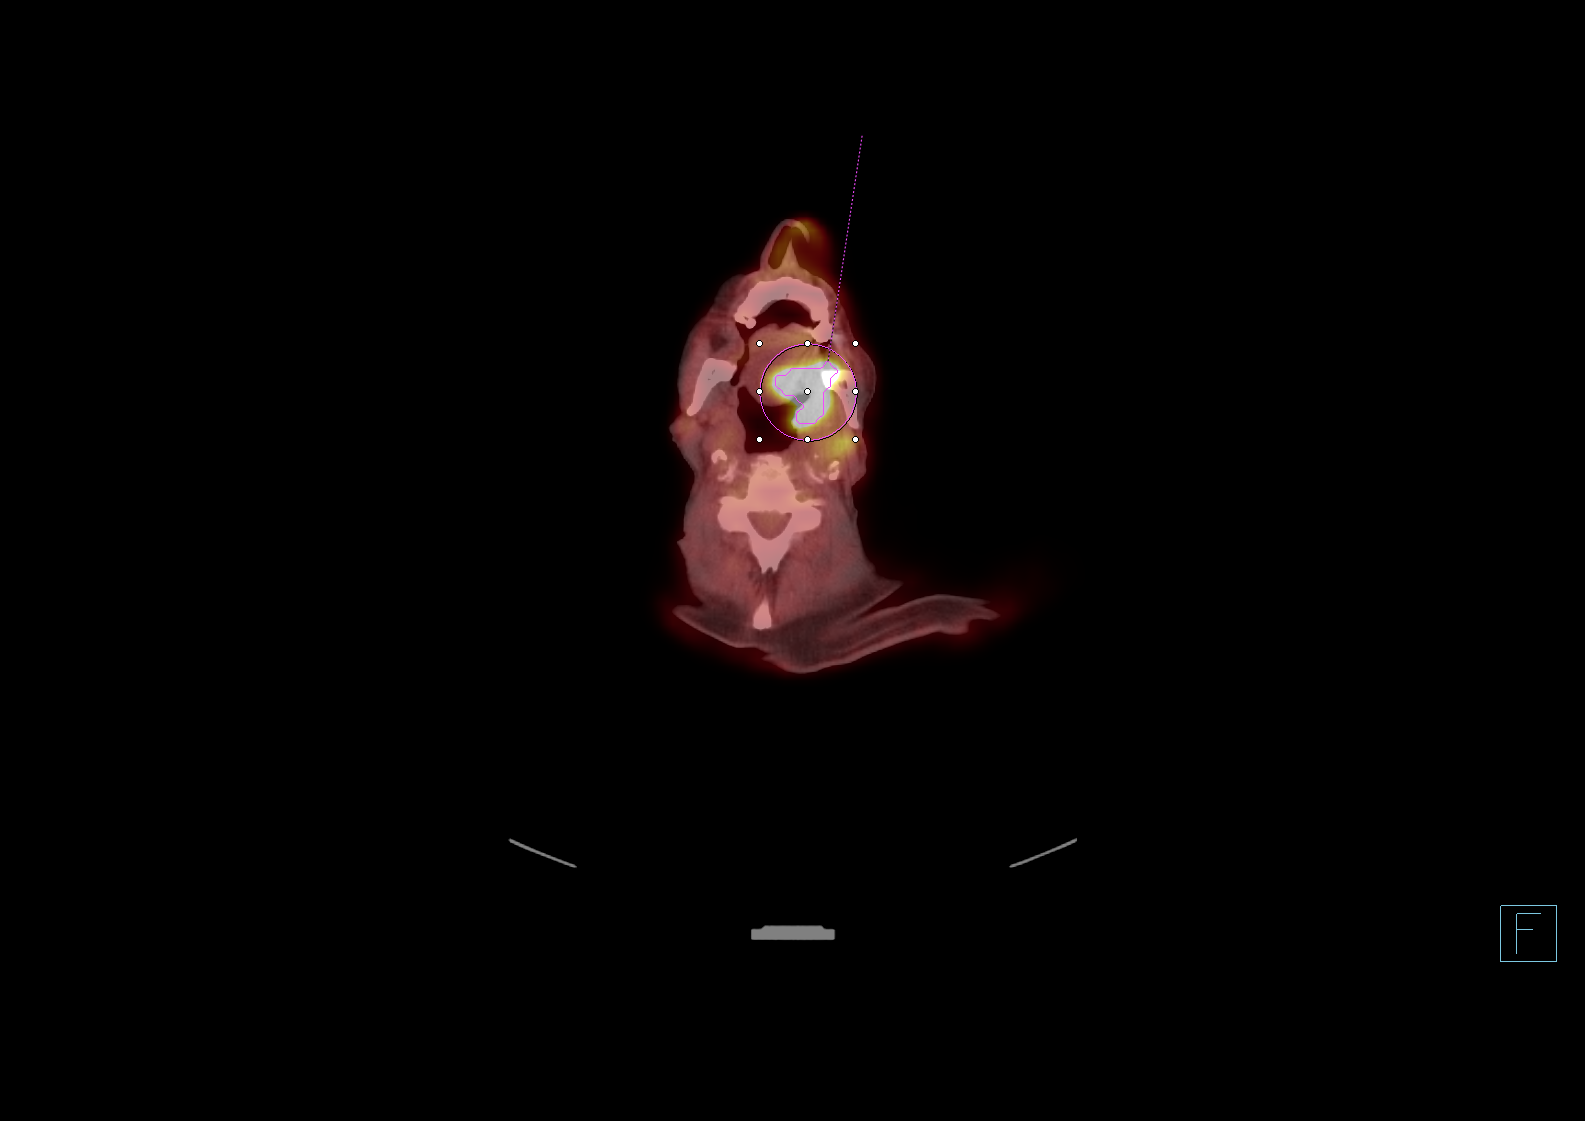

[18 of 25 positions shown; findings below may reference images not displayed]

FINDINGS: NECK

Intensely hypermetabolic mass involving the left palatine tonsil,
left glossotonsillar sulcus and left posterior tongue with max SUV
21.9. No gross intracranial extension by PET-CT.

Hypermetabolic enlarged 1.2 cm left level 2 cervical lymph node with
max SUV 6.7 (series 4/image 22).

Hypermetabolic mildly enlarged 0.8 cm left level 3 cervical lymph
node with max SUV 3.6 (series 4/image 36). No additional
hypermetabolic lymph nodes in the neck. No hypermetabolic thyroid
nodules.

CHEST

There are multiple foci of patchy ground-glass opacities and mild
patchy consolidation in the medial segment right middle lobe,
central right lower lobe and peripheral right upper lobe with
associated mild hypermetabolism, for example max SUV 4.6 in the
central right lower lobe opacities.

No hypermetabolic axillary, mediastinal or hilar nodes. Aortic valve
prosthesis is in place. Left main, left anterior descending, left
circumflex and right coronary atherosclerosis status post CABG with
ascending aortic and left internal mammary bypass grafts.
Atherosclerotic nonaneurysmal thoracic aorta. Mild cardiomegaly.
Small to moderate layering right pleural effusion with mild
compressive dependent right lower lobe atelectasis. No left pleural
effusion. Mild centrilobular emphysema and diffuse bronchial wall
thickening.

ABDOMEN/PELVIS

No abnormal hypermetabolic activity within the liver, pancreas,
adrenal glands, or spleen. No hypermetabolic lymph nodes in the
abdomen or pelvis. Cholecystectomy. Simple exophytic 9.0 cm
posterior upper left renal cyst. No overt hydronephrosis. Status
post radical cystoprostatectomy with orthotopic neobladder with no
appreciable neobladder wall thickening. Aortic atherosclerosis.
Infrarenal 3.6 cm abdominal aortic aneurysm, stable since
07/31/2015. Prominent stool throughout the colon.

SKELETON

No focal hypermetabolic activity to suggest skeletal metastasis.
IMPRESSION: 1. Intensely hypermetabolic left palatine tonsil/left
glossotonsillar sulcus/left posterior tongue mass, consistent with
primary head and neck carcinoma.
2. Hypermetabolic left level 2 and left level 3 cervical nodal
metastases.
3. No definite hypermetabolic metastatic disease in the chest,
abdomen or pelvis.
4. Mild hypermetabolism associated with multifocal patchy
ground-glass opacity and consolidation in the right lung, favor
infectious or inflammatory etiology. Recommend attention on
follow-up chest CT in 3 months.
5. Small to moderate layering right pleural effusion.
6. No metabolic evidence of recurrent tumor in the pelvis status
post radical cystoprostatectomy with orthotopic neobladder.
7. Large colonic stool volume may indicate constipation.
8. Additional findings include mild cardiomegaly, aortic
atherosclerosis, coronary atherosclerosis status post CABG, mild
centrilobular emphysema and stable 3.6 cm infrarenal abdominal
aortic aneurysm.

## 2018-05-07 IMAGING — XA IR US GUIDE VASC ACCESS RIGHT
1 series · 1 of 1 positions shown · non-contrast
Comparison: none

INDICATION: 85-year-old male with a history of left-sided head and neck cancer
in need of durable venous access.

[Series 300: line placements · 1 of 1 slices shown]
[im 1/1]
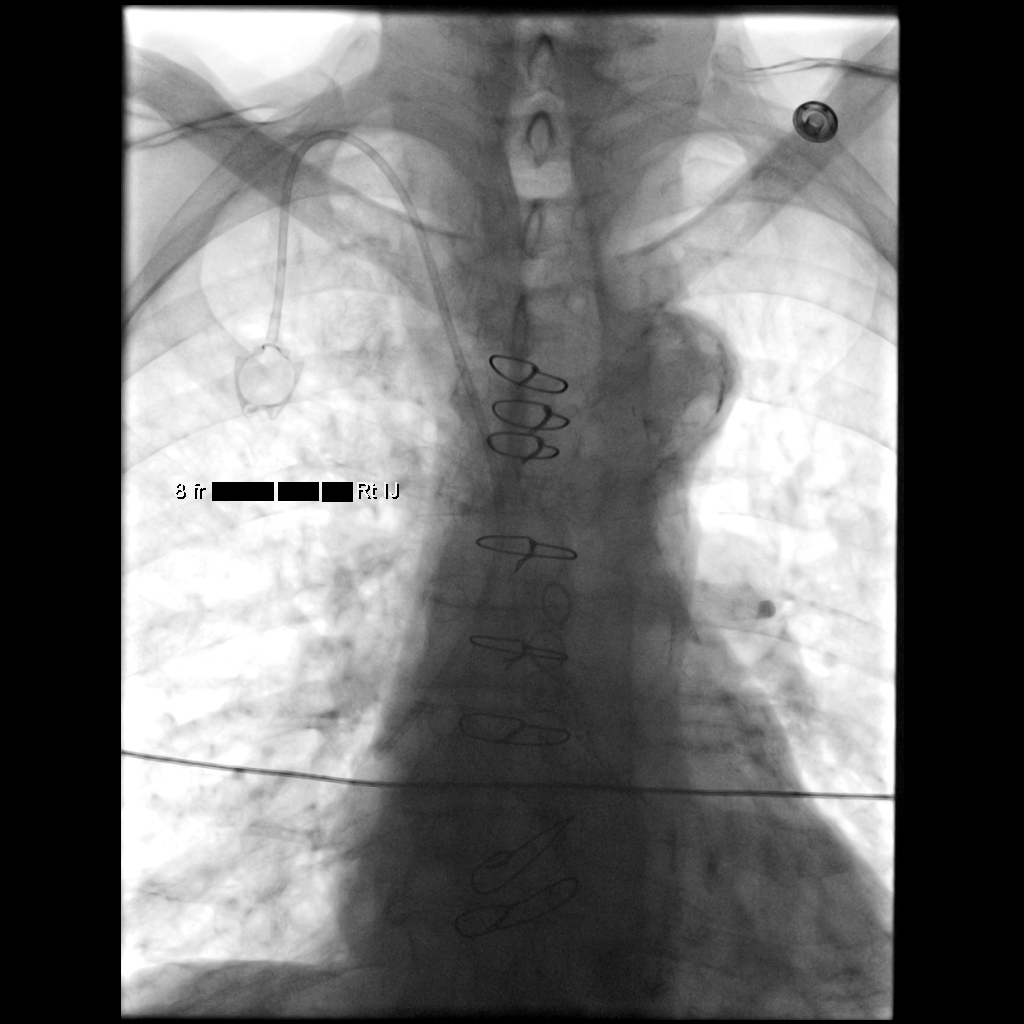

[1 of 1 positions shown; findings below may reference images not displayed]

EXAM:
IMPLANTED PORT A CATH PLACEMENT WITH ULTRASOUND AND FLUOROSCOPIC
GUIDANCE

MEDICATIONS:
2 g Ancef; The antibiotic was administered within an appropriate
time interval prior to skin puncture.

ANESTHESIA/SEDATION:
Versed 1 mg IV; Fentanyl 50 mcg IV;

Moderate Sedation Time:  25 minutes

The patient was continuously monitored during the procedure by the
interventional radiology nurse under my direct supervision.

FLUOROSCOPY TIME:  1 minutes, 6 seconds (12 mGy)

COMPLICATIONS:
None immediate.

PROCEDURE:
The right neck and chest was prepped with chlorhexidine, and draped
in the usual sterile fashion using maximum barrier technique (cap
and mask, sterile gown, sterile gloves, large sterile sheet, hand
hygiene and cutaneous antiseptic). Antibiotic prophylaxis was
provided with 2g Ancef administered IV one hour prior to skin
incision. Local anesthesia was attained by infiltration with 1%
lidocaine with epinephrine.

Ultrasound demonstrated patency of the right internal jugular vein,
and this was documented with an image. Under real-time ultrasound
guidance, this vein was accessed with a 21 gauge micropuncture
needle and image documentation was performed. A small dermatotomy
was made at the access site with an 11 scalpel. A 0.018" wire was
advanced into the SVC and the access needle exchanged for a 4F
micropuncture vascular sheath. The 0.018" wire was then removed and
a 0.035" wire advanced into the IVC.





The pocket was then closed in two layers using first subdermal
inverted interrupted absorbable sutures followed by a running
subcuticular suture. The epidermis was then sealed with Dermabond.
The dermatotomy at the venous access site was also sealed with
Dermabond.
IMPRESSION: Successful placement of a right IJ approach Power Port with
ultrasound and fluoroscopic guidance. The catheter is ready for use.

## 2018-05-07 IMAGING — XA IR PERC PLACEMENT GASTROSTOMY
1 series · 4 of 4 positions shown · non-contrast
Comparison: none

INDICATION: 85-year-old male with left-sided head and neck cancer. He presents
for percutaneous gastrostomy tube placement prior to radiation
therapy.

[Series 300: tube placements · 4 of 4 slices shown]
[im 1/4]
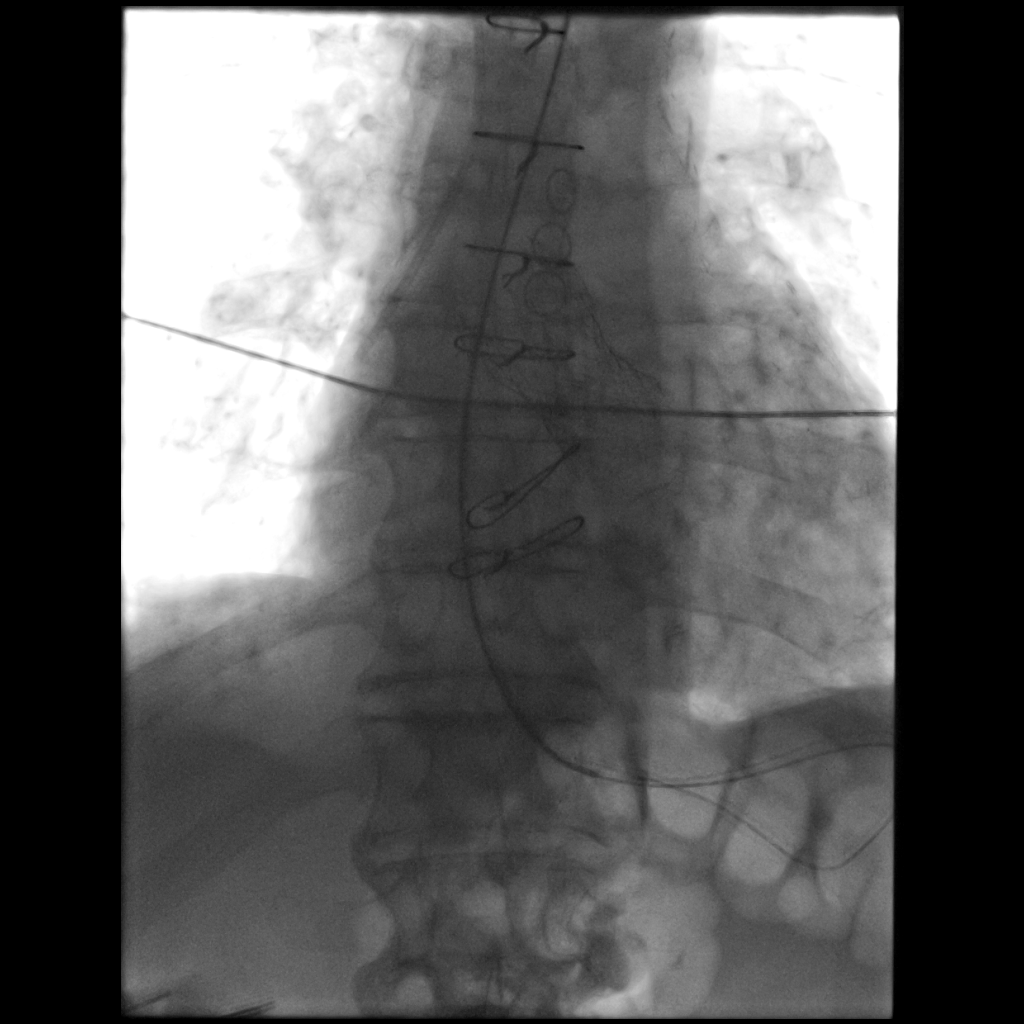
[im 2/4]
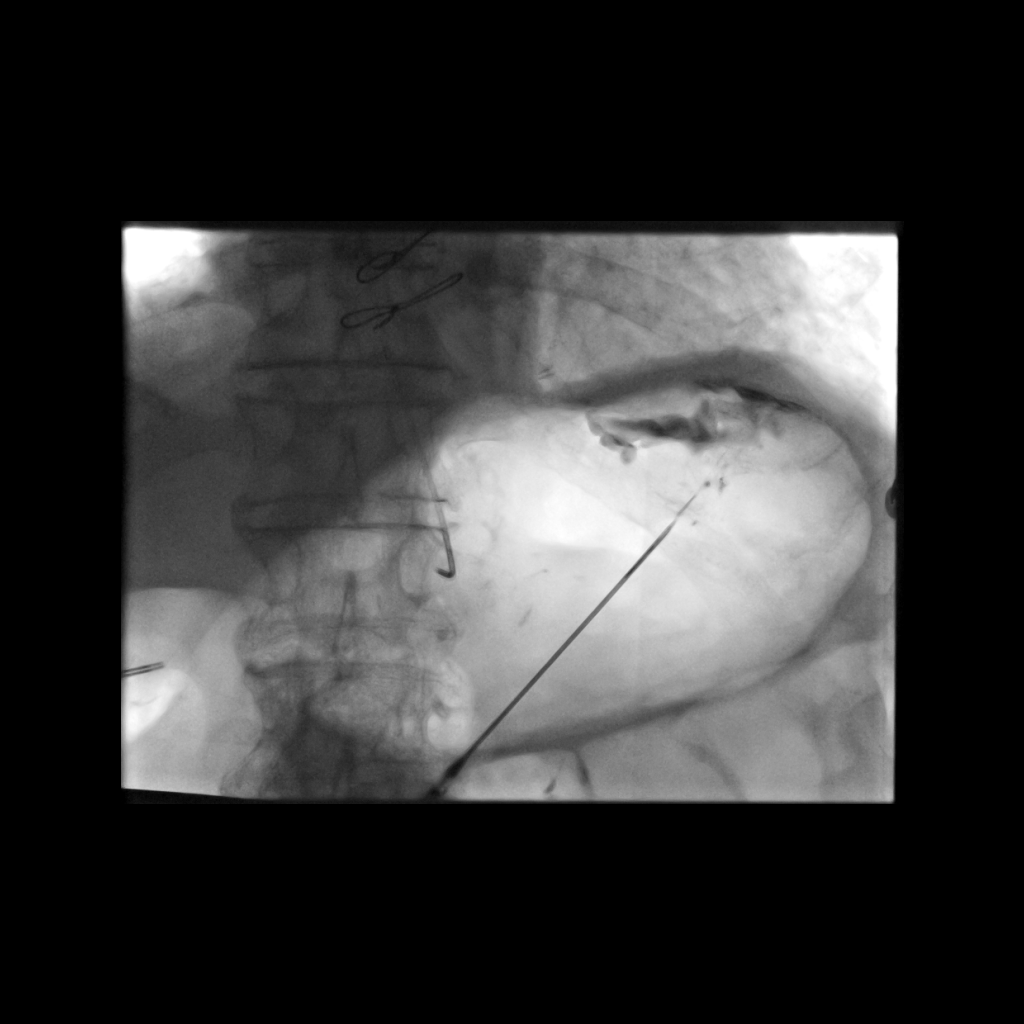
[im 3/4]
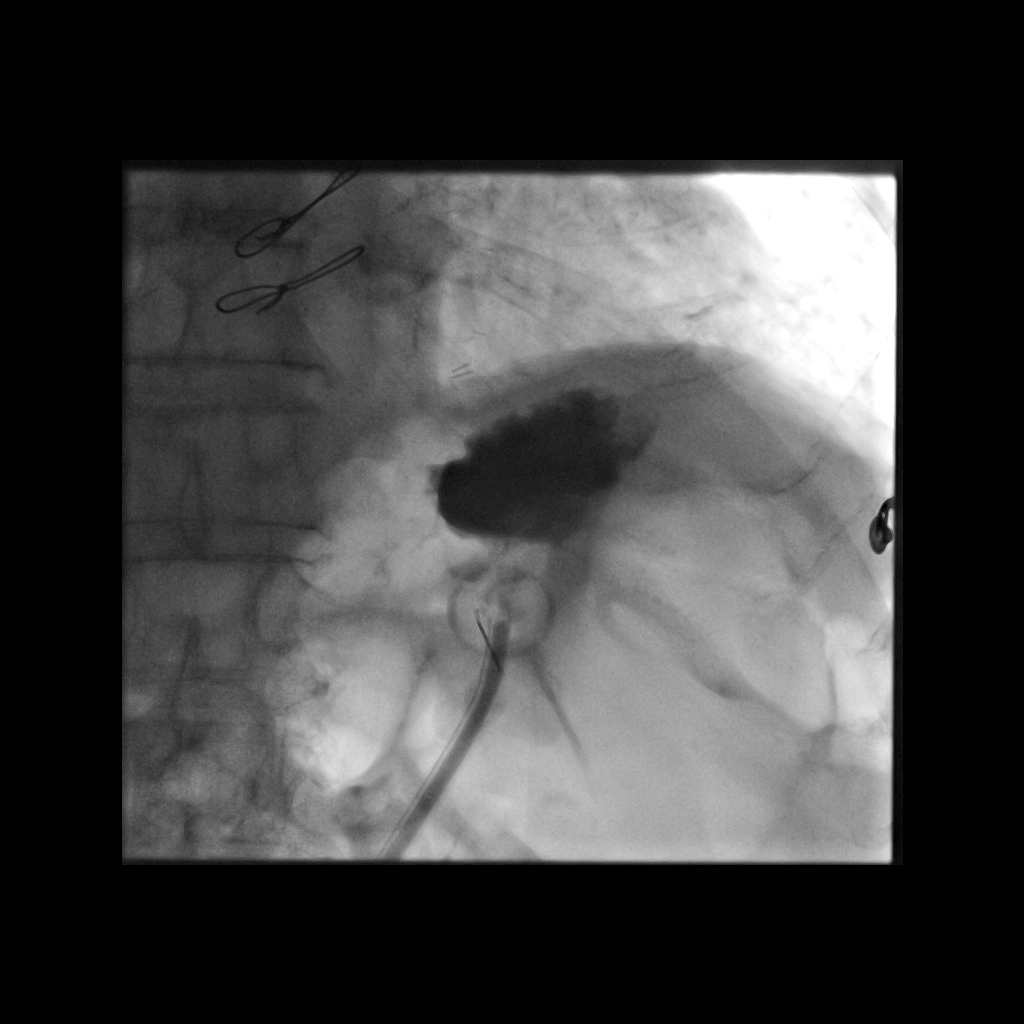
[im 4/4]
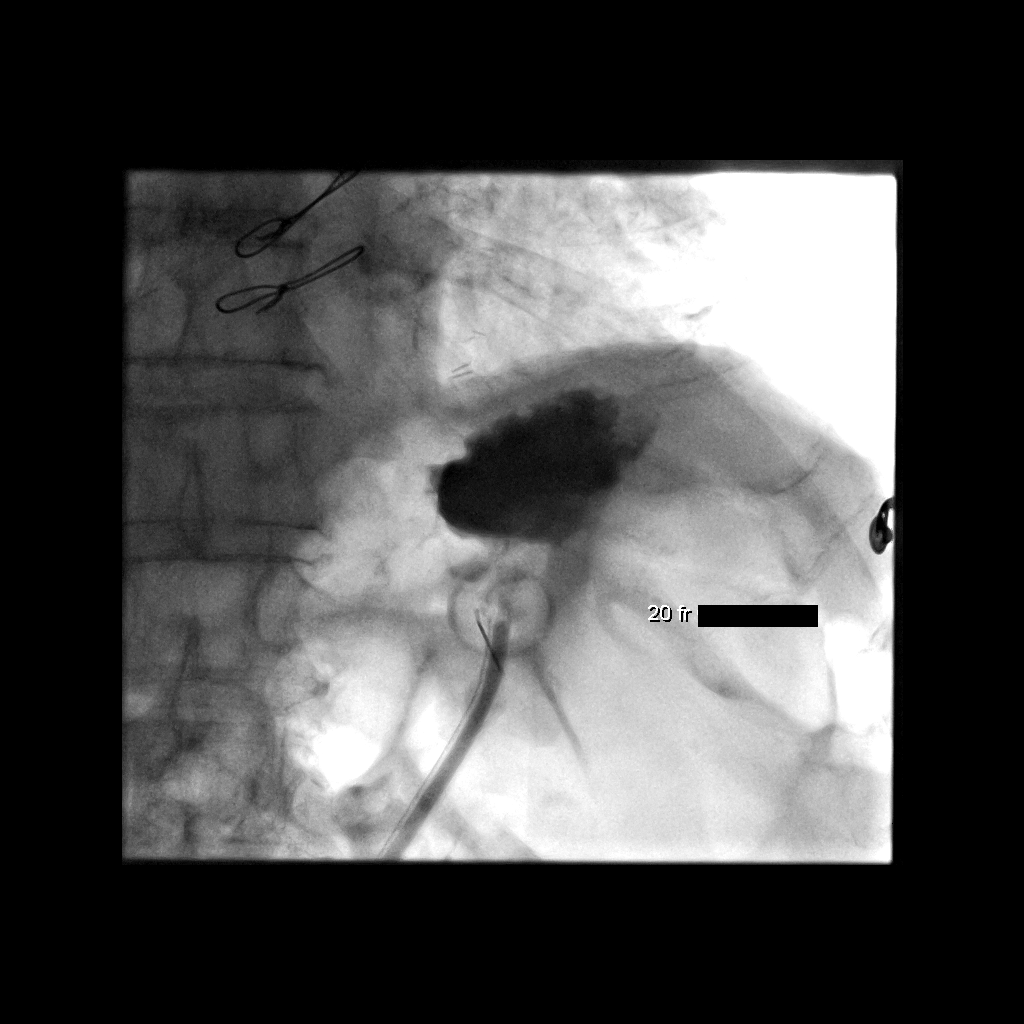

[4 of 4 positions shown; findings below may reference images not displayed]

EXAM:
Fluoroscopically guided placement of percutaneous pull-through
gastrostomy tube

MEDICATIONS:
2 g Ancef; Antibiotics were administered within 1 hour of the
procedure.

ANESTHESIA/SEDATION:
Versed 1 mg IV; Fentanyl 25 mcg IV

Moderate Sedation Time:  15 minutes

The patient was continuously monitored during the procedure by the
interventional radiology nurse under my direct supervision.

CONTRAST:  5 mL Omnipaque 300

FLUOROSCOPY TIME:  Fluoroscopy Time: 1 minutes 54 seconds (14 mGy).

COMPLICATIONS:
None immediate.

PROCEDURE:
Informed written consent was obtained from the patient after a
thorough discussion of the procedural risks, benefits and
alternatives. All questions were addressed. Maximal Sterile Barrier
Technique was utilized including caps, mask, sterile gowns, sterile
gloves, sterile drape, hand hygiene and skin antiseptic. A timeout
was performed prior to the initiation of the procedure.

Maximal barrier sterile technique utilized including caps, mask,
sterile gowns, sterile gloves, large sterile drape, hand hygiene,
and chlorhexadine skin prep.

An angled catheter was advanced over a wire under fluoroscopic
guidance through the nose, down the esophagus and into the body of
the stomach. The stomach was then insufflated with several 100 ml of
air. Fluoroscopy confirmed location of the gastric bubble, as well
as inferior displacement of the barium stained colon. Under direct
fluoroscopic guidance, a single T-tack was placed, and the anterior
gastric wall drawn up against the anterior abdominal wall.
Percutaneous access was then obtained into the mid gastric body with
an 18 gauge sheath needle. Aspiration of air, and injection of
contrast material under fluoroscopy confirmed needle placement.

An Amplatz wire was advanced in the gastric body and the access
needle exchanged for a 9-French vascular sheath. A snare device was
advanced through the vascular sheath and an Amplatz wire advanced
through the angled catheter. The Amplatz wire was successfully
snared and this was pulled up through the esophagus and out the
mouth. A 20-Jomel Boo tube was then connected to
the snare and pulled through the mouth, down the esophagus, into the
stomach and out to the anterior abdominal wall. Hand injection of
contrast material confirmed intragastric location. The T-tack
retention suture was then cut. The pull through peg tube was then
secured with the external bumper and capped.

The patient will be observed for several hours with the newly placed
tube on low wall suction to evaluate for any post procedure
complication. The patient tolerated the procedure well, there is no
immediate complication.
IMPRESSION: Successful placement of a 20 French pull through gastrostomy tube.
